# Patient Record
Sex: Female | Born: 1965
Health system: Southern US, Community
[De-identification: ages and names within clinical notes are randomized; demographics above are authoritative.]

## PROBLEM LIST (undated history)

## (undated) DIAGNOSIS — B009 Herpesviral infection, unspecified: Secondary | ICD-10-CM

## (undated) DIAGNOSIS — N870 Mild cervical dysplasia: Secondary | ICD-10-CM

## (undated) DIAGNOSIS — M797 Fibromyalgia: Secondary | ICD-10-CM

## (undated) DIAGNOSIS — M7120 Synovial cyst of popliteal space [Baker], unspecified knee: Secondary | ICD-10-CM

## (undated) DIAGNOSIS — N952 Postmenopausal atrophic vaginitis: Secondary | ICD-10-CM

## (undated) DIAGNOSIS — R0789 Other chest pain: Secondary | ICD-10-CM

## (undated) DIAGNOSIS — I10 Essential (primary) hypertension: Secondary | ICD-10-CM

## (undated) DIAGNOSIS — F419 Anxiety disorder, unspecified: Secondary | ICD-10-CM

## (undated) DIAGNOSIS — M1990 Unspecified osteoarthritis, unspecified site: Secondary | ICD-10-CM

## (undated) DIAGNOSIS — F32A Depression, unspecified: Secondary | ICD-10-CM

## (undated) DIAGNOSIS — M8580 Other specified disorders of bone density and structure, unspecified site: Secondary | ICD-10-CM

## (undated) DIAGNOSIS — B029 Zoster without complications: Secondary | ICD-10-CM

## (undated) DIAGNOSIS — M3214 Glomerular disease in systemic lupus erythematosus: Secondary | ICD-10-CM

## (undated) DIAGNOSIS — G43909 Migraine, unspecified, not intractable, without status migrainosus: Secondary | ICD-10-CM

## (undated) DIAGNOSIS — E28319 Asymptomatic premature menopause: Secondary | ICD-10-CM

## (undated) DIAGNOSIS — R079 Chest pain, unspecified: Secondary | ICD-10-CM

## (undated) DIAGNOSIS — M5030 Other cervical disc degeneration, unspecified cervical region: Secondary | ICD-10-CM

## (undated) DIAGNOSIS — D219 Benign neoplasm of connective and other soft tissue, unspecified: Secondary | ICD-10-CM

## (undated) DIAGNOSIS — G629 Polyneuropathy, unspecified: Secondary | ICD-10-CM

## (undated) HISTORY — DX: Chest pain, unspecified: R07.9

## (undated) HISTORY — PX: MYOMECTOMY: SHX85

## (undated) HISTORY — PX: TUBAL LIGATION: SHX77

## (undated) HISTORY — DX: Polyneuropathy, unspecified: G62.9

## (undated) HISTORY — DX: Postmenopausal atrophic vaginitis: N95.2

## (undated) HISTORY — DX: Benign neoplasm of connective and other soft tissue, unspecified: D21.9

## (undated) HISTORY — DX: Anxiety disorder, unspecified: F41.9

## (undated) HISTORY — DX: Essential (primary) hypertension: I10

## (undated) HISTORY — PX: COLPOSCOPY: SHX161

## (undated) HISTORY — DX: Fibromyalgia: M79.7

## (undated) HISTORY — DX: Glomerular disease in systemic lupus erythematosus: M32.14

## (undated) HISTORY — DX: Unspecified osteoarthritis, unspecified site: M19.90

## (undated) HISTORY — DX: Herpesviral infection, unspecified: B00.9

## (undated) HISTORY — DX: Synovial cyst of popliteal space (Baker), unspecified knee: M71.20

## (undated) HISTORY — DX: Asymptomatic premature menopause: E28.319

## (undated) HISTORY — DX: Other specified disorders of bone density and structure, unspecified site: M85.80

## (undated) HISTORY — DX: Depression, unspecified: F32.A

## (undated) HISTORY — DX: Other cervical disc degeneration, unspecified cervical region: M50.30

## (undated) HISTORY — PX: OTHER SURGICAL HISTORY: SHX169

## (undated) HISTORY — DX: Zoster without complications: B02.9

## (undated) HISTORY — DX: Migraine, unspecified, not intractable, without status migrainosus: G43.909

## (undated) HISTORY — PX: HYSTEROSCOPY: SHX211

## (undated) HISTORY — DX: Mild cervical dysplasia: N87.0

## (undated) HISTORY — DX: Other chest pain: R07.89

---

## 1988-03-27 DIAGNOSIS — N870 Mild cervical dysplasia: Secondary | ICD-10-CM

## 1988-03-27 HISTORY — DX: Mild cervical dysplasia: N87.0

## 1997-08-19 ENCOUNTER — Other Ambulatory Visit: Admission: RE | Admit: 1997-08-19 | Discharge: 1997-08-19 | Payer: Self-pay | Admitting: Obstetrics and Gynecology

## 1998-08-13 ENCOUNTER — Other Ambulatory Visit: Admission: RE | Admit: 1998-08-13 | Discharge: 1998-08-13 | Payer: Self-pay | Admitting: Obstetrics and Gynecology

## 1999-05-29 ENCOUNTER — Emergency Department (HOSPITAL_COMMUNITY): Admission: EM | Admit: 1999-05-29 | Discharge: 1999-05-29 | Payer: Self-pay | Admitting: Emergency Medicine

## 1999-05-29 ENCOUNTER — Encounter: Payer: Self-pay | Admitting: Emergency Medicine

## 1999-09-16 ENCOUNTER — Other Ambulatory Visit: Admission: RE | Admit: 1999-09-16 | Discharge: 1999-09-16 | Payer: Self-pay | Admitting: Obstetrics and Gynecology

## 2000-02-02 ENCOUNTER — Encounter: Admission: RE | Admit: 2000-02-02 | Discharge: 2000-02-02 | Payer: Self-pay | Admitting: Internal Medicine

## 2000-02-02 ENCOUNTER — Encounter: Payer: Self-pay | Admitting: Internal Medicine

## 2000-09-17 ENCOUNTER — Other Ambulatory Visit: Admission: RE | Admit: 2000-09-17 | Discharge: 2000-09-17 | Payer: Self-pay | Admitting: Obstetrics and Gynecology

## 2001-02-05 ENCOUNTER — Encounter: Payer: Self-pay | Admitting: Internal Medicine

## 2001-02-05 ENCOUNTER — Encounter: Admission: RE | Admit: 2001-02-05 | Discharge: 2001-02-05 | Payer: Self-pay | Admitting: Internal Medicine

## 2001-10-29 ENCOUNTER — Encounter (HOSPITAL_BASED_OUTPATIENT_CLINIC_OR_DEPARTMENT_OTHER): Payer: Self-pay | Admitting: General Surgery

## 2001-11-01 ENCOUNTER — Ambulatory Visit (HOSPITAL_COMMUNITY): Admission: RE | Admit: 2001-11-01 | Discharge: 2001-11-01 | Payer: Self-pay | Admitting: General Surgery

## 2001-11-13 ENCOUNTER — Other Ambulatory Visit: Admission: RE | Admit: 2001-11-13 | Discharge: 2001-11-13 | Payer: Self-pay | Admitting: Obstetrics and Gynecology

## 2002-11-26 ENCOUNTER — Other Ambulatory Visit: Admission: RE | Admit: 2002-11-26 | Discharge: 2002-11-26 | Payer: Self-pay | Admitting: Obstetrics and Gynecology

## 2003-01-15 ENCOUNTER — Encounter: Admission: RE | Admit: 2003-01-15 | Discharge: 2003-01-15 | Payer: Self-pay | Admitting: Internal Medicine

## 2003-01-15 ENCOUNTER — Encounter: Payer: Self-pay | Admitting: Internal Medicine

## 2003-04-23 ENCOUNTER — Ambulatory Visit (HOSPITAL_COMMUNITY): Admission: RE | Admit: 2003-04-23 | Discharge: 2003-04-23 | Payer: Self-pay

## 2003-05-28 ENCOUNTER — Ambulatory Visit (HOSPITAL_COMMUNITY): Admission: RE | Admit: 2003-05-28 | Discharge: 2003-05-28 | Payer: Self-pay | Admitting: Nephrology

## 2003-05-29 ENCOUNTER — Encounter: Admission: RE | Admit: 2003-05-29 | Discharge: 2003-05-29 | Payer: Self-pay | Admitting: Nephrology

## 2003-06-05 ENCOUNTER — Observation Stay (HOSPITAL_COMMUNITY): Admission: RE | Admit: 2003-06-05 | Discharge: 2003-06-06 | Payer: Self-pay | Admitting: Nephrology

## 2003-06-05 ENCOUNTER — Encounter (INDEPENDENT_AMBULATORY_CARE_PROVIDER_SITE_OTHER): Payer: Self-pay | Admitting: *Deleted

## 2003-06-11 ENCOUNTER — Ambulatory Visit (HOSPITAL_COMMUNITY): Admission: RE | Admit: 2003-06-11 | Discharge: 2003-06-11 | Payer: Self-pay | Admitting: Nephrology

## 2003-07-13 ENCOUNTER — Ambulatory Visit (HOSPITAL_COMMUNITY): Admission: RE | Admit: 2003-07-13 | Discharge: 2003-07-13 | Payer: Self-pay | Admitting: Nephrology

## 2003-07-20 ENCOUNTER — Encounter (HOSPITAL_COMMUNITY): Admission: RE | Admit: 2003-07-20 | Discharge: 2003-10-18 | Payer: Self-pay | Admitting: Nephrology

## 2003-10-27 ENCOUNTER — Encounter (HOSPITAL_COMMUNITY): Admission: RE | Admit: 2003-10-27 | Discharge: 2004-01-25 | Payer: Self-pay | Admitting: Nephrology

## 2003-11-27 ENCOUNTER — Other Ambulatory Visit: Admission: RE | Admit: 2003-11-27 | Discharge: 2003-11-27 | Payer: Self-pay | Admitting: Obstetrics and Gynecology

## 2004-01-26 ENCOUNTER — Encounter (HOSPITAL_COMMUNITY): Admission: RE | Admit: 2004-01-26 | Discharge: 2004-04-25 | Payer: Self-pay | Admitting: Nephrology

## 2004-02-22 ENCOUNTER — Encounter: Admission: RE | Admit: 2004-02-22 | Discharge: 2004-02-22 | Payer: Self-pay | Admitting: Obstetrics and Gynecology

## 2004-05-02 ENCOUNTER — Encounter (HOSPITAL_COMMUNITY): Admission: RE | Admit: 2004-05-02 | Discharge: 2004-07-31 | Payer: Self-pay | Admitting: Nephrology

## 2004-06-14 ENCOUNTER — Encounter: Admission: RE | Admit: 2004-06-14 | Discharge: 2004-06-14 | Payer: Self-pay | Admitting: Nephrology

## 2004-06-22 ENCOUNTER — Encounter (INDEPENDENT_AMBULATORY_CARE_PROVIDER_SITE_OTHER): Payer: Self-pay | Admitting: *Deleted

## 2004-06-22 ENCOUNTER — Ambulatory Visit (HOSPITAL_BASED_OUTPATIENT_CLINIC_OR_DEPARTMENT_OTHER): Admission: RE | Admit: 2004-06-22 | Discharge: 2004-06-22 | Payer: Self-pay | Admitting: Obstetrics and Gynecology

## 2004-06-22 ENCOUNTER — Ambulatory Visit (HOSPITAL_COMMUNITY): Admission: RE | Admit: 2004-06-22 | Discharge: 2004-06-22 | Payer: Self-pay | Admitting: Obstetrics and Gynecology

## 2004-06-28 ENCOUNTER — Ambulatory Visit: Payer: Self-pay | Admitting: Cardiology

## 2004-06-29 ENCOUNTER — Encounter: Payer: Self-pay | Admitting: Cardiology

## 2004-06-29 ENCOUNTER — Ambulatory Visit (HOSPITAL_COMMUNITY): Admission: RE | Admit: 2004-06-29 | Discharge: 2004-06-29 | Payer: Self-pay | Admitting: Nephrology

## 2004-08-29 ENCOUNTER — Encounter (HOSPITAL_COMMUNITY): Admission: RE | Admit: 2004-08-29 | Discharge: 2004-11-27 | Payer: Self-pay | Admitting: Nephrology

## 2004-11-29 ENCOUNTER — Other Ambulatory Visit: Admission: RE | Admit: 2004-11-29 | Discharge: 2004-11-29 | Payer: Self-pay | Admitting: Obstetrics and Gynecology

## 2004-12-19 ENCOUNTER — Encounter (HOSPITAL_COMMUNITY): Admission: RE | Admit: 2004-12-19 | Discharge: 2005-03-19 | Payer: Self-pay | Admitting: Nephrology

## 2005-04-05 ENCOUNTER — Encounter: Admission: RE | Admit: 2005-04-05 | Discharge: 2005-04-05 | Payer: Self-pay | Admitting: Obstetrics and Gynecology

## 2005-04-10 ENCOUNTER — Encounter (HOSPITAL_COMMUNITY): Admission: RE | Admit: 2005-04-10 | Discharge: 2005-07-09 | Payer: Self-pay | Admitting: Nephrology

## 2005-07-31 ENCOUNTER — Encounter (HOSPITAL_COMMUNITY): Admission: RE | Admit: 2005-07-31 | Discharge: 2005-10-29 | Payer: Self-pay | Admitting: Nephrology

## 2005-12-05 ENCOUNTER — Other Ambulatory Visit: Admission: RE | Admit: 2005-12-05 | Discharge: 2005-12-05 | Payer: Self-pay | Admitting: Obstetrics and Gynecology

## 2005-12-08 ENCOUNTER — Encounter (HOSPITAL_COMMUNITY): Admission: RE | Admit: 2005-12-08 | Discharge: 2006-03-08 | Payer: Self-pay | Admitting: Nephrology

## 2006-03-01 ENCOUNTER — Emergency Department (HOSPITAL_COMMUNITY): Admission: EM | Admit: 2006-03-01 | Discharge: 2006-03-01 | Payer: Self-pay | Admitting: Emergency Medicine

## 2006-03-01 ENCOUNTER — Encounter: Payer: Self-pay | Admitting: Vascular Surgery

## 2006-03-09 ENCOUNTER — Encounter (HOSPITAL_COMMUNITY): Admission: RE | Admit: 2006-03-09 | Discharge: 2006-06-07 | Payer: Self-pay | Admitting: Nephrology

## 2006-03-12 ENCOUNTER — Emergency Department (HOSPITAL_COMMUNITY): Admission: EM | Admit: 2006-03-12 | Discharge: 2006-03-13 | Payer: Self-pay | Admitting: Emergency Medicine

## 2006-04-23 ENCOUNTER — Encounter: Admission: RE | Admit: 2006-04-23 | Discharge: 2006-04-23 | Payer: Self-pay | Admitting: Obstetrics and Gynecology

## 2006-08-31 ENCOUNTER — Encounter: Admission: RE | Admit: 2006-08-31 | Discharge: 2006-08-31 | Payer: Self-pay | Admitting: Nephrology

## 2006-12-11 ENCOUNTER — Other Ambulatory Visit: Admission: RE | Admit: 2006-12-11 | Discharge: 2006-12-11 | Payer: Self-pay | Admitting: Obstetrics and Gynecology

## 2007-03-05 ENCOUNTER — Emergency Department (HOSPITAL_COMMUNITY): Admission: EM | Admit: 2007-03-05 | Discharge: 2007-03-05 | Payer: Self-pay | Admitting: Emergency Medicine

## 2007-06-11 ENCOUNTER — Encounter: Admission: RE | Admit: 2007-06-11 | Discharge: 2007-06-11 | Payer: Self-pay | Admitting: Obstetrics and Gynecology

## 2008-02-04 ENCOUNTER — Encounter: Payer: Self-pay | Admitting: Obstetrics and Gynecology

## 2008-02-04 ENCOUNTER — Ambulatory Visit: Payer: Self-pay | Admitting: Obstetrics and Gynecology

## 2008-02-04 ENCOUNTER — Other Ambulatory Visit: Admission: RE | Admit: 2008-02-04 | Discharge: 2008-02-04 | Payer: Self-pay | Admitting: Obstetrics and Gynecology

## 2008-04-27 ENCOUNTER — Emergency Department (HOSPITAL_COMMUNITY): Admission: EM | Admit: 2008-04-27 | Discharge: 2008-04-27 | Payer: Self-pay | Admitting: Emergency Medicine

## 2008-06-29 ENCOUNTER — Encounter: Admission: RE | Admit: 2008-06-29 | Discharge: 2008-06-29 | Payer: Self-pay | Admitting: Obstetrics and Gynecology

## 2008-07-07 ENCOUNTER — Ambulatory Visit: Payer: Self-pay | Admitting: Obstetrics and Gynecology

## 2008-07-20 ENCOUNTER — Encounter: Admission: RE | Admit: 2008-07-20 | Discharge: 2008-07-20 | Payer: Self-pay | Admitting: Internal Medicine

## 2009-02-09 ENCOUNTER — Other Ambulatory Visit: Admission: RE | Admit: 2009-02-09 | Discharge: 2009-02-09 | Payer: Self-pay | Admitting: Obstetrics and Gynecology

## 2009-02-09 ENCOUNTER — Ambulatory Visit: Payer: Self-pay | Admitting: Obstetrics and Gynecology

## 2009-02-09 ENCOUNTER — Encounter: Payer: Self-pay | Admitting: Obstetrics and Gynecology

## 2009-08-30 ENCOUNTER — Encounter: Admission: RE | Admit: 2009-08-30 | Discharge: 2009-08-30 | Payer: Self-pay | Admitting: Obstetrics and Gynecology

## 2010-04-11 ENCOUNTER — Ambulatory Visit
Admission: RE | Admit: 2010-04-11 | Discharge: 2010-04-11 | Payer: Self-pay | Source: Home / Self Care | Attending: Obstetrics and Gynecology | Admitting: Obstetrics and Gynecology

## 2010-04-11 ENCOUNTER — Other Ambulatory Visit
Admission: RE | Admit: 2010-04-11 | Discharge: 2010-04-11 | Payer: Self-pay | Source: Home / Self Care | Admitting: Obstetrics and Gynecology

## 2010-04-17 ENCOUNTER — Encounter: Payer: Self-pay | Admitting: Nephrology

## 2010-04-17 ENCOUNTER — Encounter: Payer: Self-pay | Admitting: Obstetrics and Gynecology

## 2010-07-19 ENCOUNTER — Encounter (INDEPENDENT_AMBULATORY_CARE_PROVIDER_SITE_OTHER): Payer: 59

## 2010-07-19 DIAGNOSIS — M949 Disorder of cartilage, unspecified: Secondary | ICD-10-CM

## 2010-07-19 DIAGNOSIS — M899 Disorder of bone, unspecified: Secondary | ICD-10-CM

## 2010-08-12 NOTE — Op Note (Signed)
   NAME:  Marie Peterson, Marie Peterson                           ACCOUNT NO.:  [id number]   MEDICAL RECORD NO.:  [id number]                   PATIENT TYPE:  OIB   LOCATION:  2899                                 FACILITY:  [facility]   PHYSICIAN:  Luisa Hart L. Lurene Shadow, M.D.             DATE OF BIRTH:  [birth year]   DATE OF PROCEDURE:  11/01/2001  DATE OF DISCHARGE:  11/01/2001                                 OPERATIVE REPORT   PREOPERATIVE DIAGNOSIS:  Mass, left submandibular area.   POSTOPERATIVE DIAGNOSIS:  Mass, left submandibular area.   PROCEDURE:  Excision of left-sided submandibular cyst.   SURGEON:  Luisa Hart L. Lurene Shadow, M.D.   ASSISTANT:  Nurse.   ANESTHESIA:  General.   INDICATIONS:  This patient is a 45 year old woman presenting with an  enlarging cystic mass in the left submandibular area, which on palpation  feels like a lipoma or a lymph node.  She desires excision and comes to the  operating room after the risks and benefits of surgery have been fully  discussed with her.   DESCRIPTION OF PROCEDURE:  Following induction of satisfactory general  anesthesia, the patient was positioned supinely, head and neck turned toward  the right.  The neck and upper chest were prepped and draped to be included  in the sterile operative field.  I infiltrated this area with 0.5% Marcaine  with epinephrine.  I made a transverse incision along the skin lines over  the mass, deepened this through the skin and subcutaneous tissues, taking  the dissection across the platysma muscle to the mass, which appeared to be  cystic and was sitting on top of a large posterior jugular vein.  The mass  was dissected free from the surrounding tissues, removed in its entirety and  forwarded for pathologic evaluation.  All of the areas of dissection were  then checked for hemostasis, additional bleeding points treated with  electrocautery.  The sponge, instrument, and sharp counts verified and the  platysma muscle and  subcutaneous tissues were reapproximated with 3-0 Vicryl  suture.  The skin was closed with 5-0 Monocryl suture and then reinforced  with Steri-Strips.  Sterile dressing was applied.  Anesthetic reversed and  the patient removed from the operating room to the recovery room in stable  condition.  She tolerated the procedure well.                                                [name], M.D.    PLB/MEDQ  D:  11/01/2001  T:  11/04/2001  Job:  [id number]

## 2010-08-12 NOTE — Discharge Summary (Signed)
NAME:  Marie Peterson, Marie Peterson                           ACCOUNT NO.:  [id number]   MEDICAL RECORD NO.:  [id number]                   PATIENT TYPE:  OBV   LOCATION:  6715                                 FACILITY:  [facility]   PHYSICIAN:  Terrial Rhodes, M.D.             DATE OF BIRTH:  02-01-1966   DATE OF ADMISSION:  06/05/2003  DATE OF DISCHARGE:  06/06/2003                                 DISCHARGE SUMMARY   ADMISSION DIAGNOSES:  1. Acute and subacute glomerulonephritis with nephrotic syndrome.  2. Proteinuria.  3. History of discoid lupus.  4. Hypertension.   DISCHARGE DIAGNOSES:  1. Acute and subacute glomerulonephritis with nephrotic syndrome status post     renal biopsy.  2. Discoid lupus.  3. Proteinuria.  4. Hypertension.   BRIEF HISTORY:  [name] with history of discoid lupus and  nephrotic range proteinuria along with anemia, hypertension, is admitted now  for a renal biopsy.   LABORATORY DATA ON ADMISSION:  Sodium 139, potassium 4.3, chloride 106,  CO2  28, BUN 16, creatinine 0.9, glucose 108.  Platelets 434, hemoglobin 10.5.  Coags  within normal limits.   HOSPITAL COURSE:  The patient underwent a left kidney biopsy guided by renal  ultrasound.  Two passes were made, and two core renal biopsies were  obtained.  There were no acute complications.  The patient tolerated it  well.  She did not experience any unusual pain or hematuria.  The following  morning, her hemoglobin was 9.7.  This revealed a fluctuation; however, it  was 9.4 prior to the 10.5 on admission, then 9.7.  Without hematuria or  flank pain, it did not appear the patient was hemorrhaging.  She was  discharged in stable condition.  She would follow up with Dr. Eliott Nine one to  two weeks when biopsy results are back.  She was also instructed to call the  office with any new onset of stomach pain, back pain, nausea, vomiting,  shortness of breath, or gross hematuria.  She was given hydrocodone to  take  as needed for pain.   DISCHARGE MEDICATIONS:  1. Prednisone 60 mg daily.  2. Lotrel 10/20 mg 1 daily.  3. Hydrochlorothiazide 25 mg daily.  4. Lasix 80 mg b.i.d.  5. Nu-Iron 150 mg daily.      Zenovia Jordan, P.A.                      Terrial Rhodes, M.D.    RRK/MEDQ  D:  06/06/2003  T:  06/08/2003  Job:  [id number]   cc:   [facility]

## 2010-08-12 NOTE — Op Note (Signed)
NAMEDALIS, BEERS                 ACCOUNT NO.:  [id number]   MEDICAL RECORD NO.:  [id number]          PATIENT TYPE:  AMB   LOCATION:  [facility]                         FACILITY:  [facility]   PHYSICIAN:  Daniel L. Gottsegen, M.D.DATE OF BIRTH:  05-10-1965   DATE OF PROCEDURE:  06/22/2004  DATE OF DISCHARGE:                                 OPERATIVE REPORT   PREOPERATIVE DIAGNOSIS:  Dysfunctional uterine bleeding with endometrial  cavity defect.   POSTOPERATIVE DIAGNOSIS:  Dysfunctional uterine bleeding with submucous  myoma.   OPERATION:  Hysteroscopy with resection of submucous myoma.   SURGEON:  Daniel L. Eda Paschal, M.D.   ANESTHESIA:  General.   INDICATIONS:  The patient is a 45 year old gravida 2, para 2, AB, who had  gone through an early menopause, probably secondary to her Cytoxan that she  has taken for her lupus. After going through menopause and not bleeding for  a while, she started to have uncontrolled dysfunctional uterine bleeding.  Ultrasound revealed a 2-3 cm endometrial cavity defect to explain the above.  She now enters the hospital for hysteroscopy and excision of the above.   FINDINGS:  Externals normal. BUS is normal. Vaginal is normal. Cervix is  clean. Uterus is top normal size and shape with 1-1/2 degrees of uterine  descensus. Adnexa are palpably normal. At the time of hysteroscopy, the only  abnormal finding was a large submucous myoma on the anterior wall of the  fundus. It was in the lower part of the fundus. The best estimate of size  was 2-3 cm. It was only about half in the endometrial cavity and about half  in the myometrium.   PROCEDURE:  After adequate general anesthesia, the patient was placed in the  dorsal supine position, prepped and draped usual sterile manner. A single-  tooth tenaculum was placed in the anterior lip of the cervix. The cervix was  dilated to #[facility] dilator. The hysteroscopic resectoscope was introduced,  and 3% sorbitol was  used to expand the intrauterine cavity. A camera was  used for magnification. Settings of the Bovie were 70 coag, 110 cutting,  blend 1. The submucous myoma described above was seen. It was resected with  the wire loop. As it was resected, more and more of it would bulge into the  endometrial cavity from the myometrium and finally when the surgeon was  finished, he felt that he had probably resected the entire myoma, although  it became more difficult to tell because we were fairly deep in the  myometrium at that point. Bleeding was controlled with a coag setting on the  wire loop. All pieces  were removed. Some had to be removed with a sharp curette or a sponge stick.  At the termination of the procedure, there was no bleeding noted. Fluid  deficit was approximately 430 cc. Total operating time was approximately 45  minutes. The patient tolerated the procedure well and left the operating  room in satisfactory condition.      DLG/MEDQ  D:  06/22/2004  T:  06/22/2004  Job:  [id number]

## 2010-09-14 ENCOUNTER — Other Ambulatory Visit: Payer: Self-pay | Admitting: Obstetrics and Gynecology

## 2010-09-14 DIAGNOSIS — Z1231 Encounter for screening mammogram for malignant neoplasm of breast: Secondary | ICD-10-CM

## 2010-09-21 ENCOUNTER — Ambulatory Visit: Payer: Medicare Other

## 2010-09-23 ENCOUNTER — Other Ambulatory Visit: Payer: Self-pay | Admitting: Internal Medicine

## 2010-09-23 DIAGNOSIS — E041 Nontoxic single thyroid nodule: Secondary | ICD-10-CM

## 2010-09-26 ENCOUNTER — Ambulatory Visit
Admission: RE | Admit: 2010-09-26 | Discharge: 2010-09-26 | Disposition: A | Discharge status: Home / Self Care | Payer: 59 | Source: Ambulatory Visit | Attending: Obstetrics and Gynecology | Admitting: Obstetrics and Gynecology

## 2010-09-26 DIAGNOSIS — Z1231 Encounter for screening mammogram for malignant neoplasm of breast: Secondary | ICD-10-CM

## 2010-09-27 ENCOUNTER — Ambulatory Visit
Admission: RE | Admit: 2010-09-27 | Discharge: 2010-09-27 | Disposition: A | Discharge status: Home / Self Care | Payer: 59 | Source: Ambulatory Visit | Attending: Internal Medicine | Admitting: Internal Medicine

## 2010-09-27 DIAGNOSIS — E041 Nontoxic single thyroid nodule: Secondary | ICD-10-CM

## 2010-10-20 ENCOUNTER — Other Ambulatory Visit: Payer: Self-pay | Admitting: Diagnostic Neuroimaging

## 2010-10-20 DIAGNOSIS — M79609 Pain in unspecified limb: Secondary | ICD-10-CM

## 2010-10-20 DIAGNOSIS — R209 Unspecified disturbances of skin sensation: Secondary | ICD-10-CM

## 2010-10-20 DIAGNOSIS — R292 Abnormal reflex: Secondary | ICD-10-CM

## 2010-10-25 ENCOUNTER — Ambulatory Visit
Admission: RE | Admit: 2010-10-25 | Discharge: 2010-10-25 | Disposition: A | Discharge status: Home / Self Care | Payer: 59 | Source: Ambulatory Visit | Attending: Diagnostic Neuroimaging | Admitting: Diagnostic Neuroimaging

## 2010-10-25 DIAGNOSIS — R209 Unspecified disturbances of skin sensation: Secondary | ICD-10-CM

## 2010-10-25 DIAGNOSIS — R292 Abnormal reflex: Secondary | ICD-10-CM

## 2010-10-25 DIAGNOSIS — M79609 Pain in unspecified limb: Secondary | ICD-10-CM

## 2010-10-25 MED ORDER — GADOBENATE DIMEGLUMINE 529 MG/ML IV SOLN
15.0000 mL | Freq: Once | INTRAVENOUS | Status: AC | PRN
  Administered 2010-10-25: 15 mL via INTRAVENOUS

## 2010-12-14 ENCOUNTER — Emergency Department (HOSPITAL_COMMUNITY)
Admission: EM | Admit: 2010-12-14 | Discharge: 2010-12-14 | Disposition: A | Discharge status: Home / Self Care | Payer: 59 | Attending: Emergency Medicine | Admitting: Emergency Medicine

## 2010-12-14 ENCOUNTER — Emergency Department (HOSPITAL_COMMUNITY): Payer: 59

## 2010-12-14 DIAGNOSIS — I10 Essential (primary) hypertension: Secondary | ICD-10-CM | POA: Insufficient documentation

## 2010-12-14 DIAGNOSIS — Z79899 Other long term (current) drug therapy: Secondary | ICD-10-CM | POA: Insufficient documentation

## 2010-12-14 DIAGNOSIS — R079 Chest pain, unspecified: Secondary | ICD-10-CM | POA: Insufficient documentation

## 2010-12-14 LAB — COMPREHENSIVE METABOLIC PANEL
ALT: 14 U/L (ref 0–35)
AST: 19 U/L (ref 0–37)
Albumin: 3.9 g/dL (ref 3.5–5.2)
Alkaline Phosphatase: 59 U/L (ref 39–117)
BUN: 10 mg/dL (ref 6–23)
CO2: 30 mEq/L (ref 19–32)
Calcium: 9.2 mg/dL (ref 8.4–10.5)
Chloride: 104 mEq/L (ref 96–112)
Creatinine, Ser: 0.8 mg/dL (ref 0.50–1.10)
GFR calc Af Amer: >60 mL/min (ref >60)
GFR calc non Af Amer: >60 mL/min (ref >60)
Glucose, Bld: 83 mg/dL (ref 70–99)
Potassium: 3.5 mEq/L (ref 3.5–5.1)
Sodium: 141 mEq/L (ref 135–145)
Total Bilirubin: 0.4 mg/dL (ref 0.3–1.2)
Total Protein: 7 g/dL (ref 6.0–8.3)

## 2010-12-14 LAB — POCT I-STAT TROPONIN I
Troponin i, poc: 0 ng/mL (ref 0.00–0.08)
Troponin i, poc: 0 ng/mL (ref 0.00–0.08)

## 2010-12-14 LAB — DIFFERENTIAL
Basophils Absolute: 0 10*3/uL (ref 0.0–0.1)
Basophils Relative: 1 % (ref 0–1)
Eosinophils Absolute: 0 10*3/uL (ref 0.0–0.7)
Eosinophils Relative: 1 % (ref 0–5)
Lymphocytes Relative: 54 % — ABNORMAL HIGH (ref 12–46)
Lymphs Abs: 2.1 10*3/uL (ref 0.7–4.0)
Monocytes Absolute: 0.4 10*3/uL (ref 0.1–1.0)
Monocytes Relative: 11 % (ref 3–12)
Neutro Abs: 1.4 10*3/uL — ABNORMAL LOW (ref 1.7–7.7)
Neutrophils Relative %: 34 % — ABNORMAL LOW (ref 43–77)

## 2010-12-14 LAB — CBC
HCT: 35.2 % — ABNORMAL LOW (ref 36.0–46.0)
Hemoglobin: 12.6 g/dL (ref 12.0–15.0)
MCH: 31.3 pg (ref 26.0–34.0)
MCHC: 35.8 g/dL (ref 30.0–36.0)
MCV: 87.3 fL (ref 78.0–100.0)
Platelets: 183 10*3/uL (ref 150–400)
RBC: 4.03 MIL/uL (ref 3.87–5.11)
RDW: 11.6 % (ref 11.5–15.5)
WBC: 4 10*3/uL (ref 4.0–10.5)

## 2011-04-07 ENCOUNTER — Other Ambulatory Visit: Payer: Self-pay | Admitting: Obstetrics and Gynecology

## 2011-04-17 ENCOUNTER — Ambulatory Visit: Payer: 59 | Admitting: Physical Therapy

## 2011-05-01 DIAGNOSIS — M25569 Pain in unspecified knee: Secondary | ICD-10-CM | POA: Diagnosis not present

## 2011-05-01 DIAGNOSIS — M329 Systemic lupus erythematosus, unspecified: Secondary | ICD-10-CM | POA: Diagnosis not present

## 2011-05-11 ENCOUNTER — Other Ambulatory Visit: Payer: Self-pay | Admitting: Obstetrics and Gynecology

## 2011-05-18 ENCOUNTER — Encounter: Payer: Self-pay | Admitting: Gynecology

## 2011-05-18 DIAGNOSIS — M8580 Other specified disorders of bone density and structure, unspecified site: Secondary | ICD-10-CM | POA: Insufficient documentation

## 2011-05-18 DIAGNOSIS — N952 Postmenopausal atrophic vaginitis: Secondary | ICD-10-CM | POA: Insufficient documentation

## 2011-05-18 DIAGNOSIS — D219 Benign neoplasm of connective and other soft tissue, unspecified: Secondary | ICD-10-CM | POA: Insufficient documentation

## 2011-05-18 DIAGNOSIS — M3214 Glomerular disease in systemic lupus erythematosus: Secondary | ICD-10-CM | POA: Insufficient documentation

## 2011-05-18 DIAGNOSIS — G43909 Migraine, unspecified, not intractable, without status migrainosus: Secondary | ICD-10-CM | POA: Insufficient documentation

## 2011-05-24 DIAGNOSIS — R609 Edema, unspecified: Secondary | ICD-10-CM | POA: Diagnosis not present

## 2011-05-24 DIAGNOSIS — M329 Systemic lupus erythematosus, unspecified: Secondary | ICD-10-CM | POA: Diagnosis not present

## 2011-05-25 ENCOUNTER — Encounter: Payer: 59 | Admitting: Obstetrics and Gynecology

## 2011-05-26 DIAGNOSIS — M329 Systemic lupus erythematosus, unspecified: Secondary | ICD-10-CM | POA: Diagnosis not present

## 2011-07-04 ENCOUNTER — Encounter: Payer: Self-pay | Admitting: Obstetrics and Gynecology

## 2011-07-04 ENCOUNTER — Ambulatory Visit (INDEPENDENT_AMBULATORY_CARE_PROVIDER_SITE_OTHER): Payer: 59 | Admitting: Obstetrics and Gynecology

## 2011-07-04 VITALS — BP 120/74 | Ht 64.0 in | Wt 162.0 lb

## 2011-07-04 DIAGNOSIS — N951 Menopausal and female climacteric states: Secondary | ICD-10-CM | POA: Diagnosis not present

## 2011-07-04 DIAGNOSIS — R351 Nocturia: Secondary | ICD-10-CM

## 2011-07-04 DIAGNOSIS — M8580 Other specified disorders of bone density and structure, unspecified site: Secondary | ICD-10-CM

## 2011-07-04 DIAGNOSIS — R3915 Urgency of urination: Secondary | ICD-10-CM

## 2011-07-04 DIAGNOSIS — N952 Postmenopausal atrophic vaginitis: Secondary | ICD-10-CM

## 2011-07-04 DIAGNOSIS — Z780 Asymptomatic menopausal state: Secondary | ICD-10-CM

## 2011-07-04 DIAGNOSIS — N950 Postmenopausal bleeding: Secondary | ICD-10-CM

## 2011-07-04 DIAGNOSIS — M949 Disorder of cartilage, unspecified: Secondary | ICD-10-CM

## 2011-07-04 DIAGNOSIS — M899 Disorder of bone, unspecified: Secondary | ICD-10-CM

## 2011-07-04 DIAGNOSIS — M1990 Unspecified osteoarthritis, unspecified site: Secondary | ICD-10-CM | POA: Insufficient documentation

## 2011-07-04 NOTE — Progress Notes (Signed)
Patient came back to see me today for further followup. She went through premature menopause and for a while she took systemic estrogen. She has now stopped it. She is having some night sweats but they are tolerable. She does have atrophic vaginitis and when they last had intercourse 3-4 months ago it was painful and she had a little bleeding afterwards. We have given her Estrace cream but she only used it when she had intercourse. We have had her on biphosphonate's for 5-6 years and her last bone density was now normal. She has had no fractures. Other than the episode related to intercourse she is having no vaginal bleeding. She is having no pelvic pain. She is getting 4-5 times a night to urinate and has urgency. She does not have glaucoma. She does not have dysuria or incontinence. Her PCP started her on ditropan but is not helping. She had a urinalysis at her nephrologist one month ago.  ROS: 12 system review done. Pertinent positives above. Other positives include lupus nephritis, migraines, and osteoarthritis.  Physical examination: Kennon Portela present HEENT within normal limits. Neck: Thyroid not large. No masses. Supraclavicular nodes: not enlarged. Breasts: Examined in both sitting and lying  position. No skin changes and no masses. Abdomen: Soft no guarding rebound or masses or hernia. Pelvic: External: Within normal limits. BUS: Within normal limits. Vaginal:within normal limits. Poor  estrogen effect. No evidence of cystocele rectocele or enterocele. Cervix: clean. Uterus: Normal size and shape. Adnexa: No masses. Rectovaginal exam: Confirmatory and negative. Extremities: Within normal limits.  Assessment: #1. Half flashes #2. Atrophic vaginitis with some bleeding with  last intercourse. #3. Low bone mass now better #4. Detrussor instability.  Plan: Continue yearly mammograms. Stopped patient's Fosamax. Follow up bone density next year. Enablex 7.5mg m daily. Told the patient we can  increase the dose. Stop ditropan.  Estrace vaginal cream use 2-3 times a week but not with intercourse.

## 2011-07-11 DIAGNOSIS — R079 Chest pain, unspecified: Secondary | ICD-10-CM | POA: Diagnosis not present

## 2011-07-11 DIAGNOSIS — E782 Mixed hyperlipidemia: Secondary | ICD-10-CM | POA: Diagnosis not present

## 2011-07-12 ENCOUNTER — Telehealth: Payer: Self-pay | Admitting: *Deleted

## 2011-07-12 MED ORDER — DARIFENACIN HYDROBROMIDE ER 15 MG PO TB24: 15.0000 mg | ORAL_TABLET | Freq: Every day | ORAL | Status: DC

## 2011-07-12 NOTE — Telephone Encounter (Signed)
Pt was seen on 07/04/11 and given enablex 7.5 mg tablets, pt is requesting a higher does on enablex. Please advise

## 2011-07-12 NOTE — Telephone Encounter (Signed)
Increase enablex to 15mg . Daily. They make a 15mg m tablet but she can take two of the 7.5 until she runs out.

## 2011-07-12 NOTE — Telephone Encounter (Signed)
Pt informed with the below note, rx sent, pt will be out to 7.5 tablets on Sunday.

## 2011-07-13 ENCOUNTER — Telehealth: Payer: Self-pay | Admitting: *Deleted

## 2011-07-13 MED ORDER — OXYBUTYNIN CHLORIDE 5 MG PO TABS: 5.0000 mg | ORAL_TABLET | Freq: Three times a day (TID) | ORAL | Status: DC

## 2011-07-13 MED ORDER — OXYBUTYNIN CHLORIDE 10 % TD GEL: 1.1400 mL | Freq: Every day | TRANSDERMAL | Status: DC

## 2011-07-13 NOTE — Telephone Encounter (Signed)
Pt said that Dr.Kim tried her on this medication and it did not work at all. I can give pt samples but I didn't want to take all of the them.

## 2011-07-13 NOTE — Telephone Encounter (Signed)
rx sent to pharmacy, pt informed with the below note

## 2011-07-13 NOTE — Telephone Encounter (Signed)
Addended by: Aura Camps on: 07/13/2011 11:40 AM   Modules accepted: Orders

## 2011-07-13 NOTE — Telephone Encounter (Signed)
Have her try gelnique. It is a patch. One patch daily upper arm/shoulder/thigh  or her abdomen. Rotated daily

## 2011-07-13 NOTE — Telephone Encounter (Signed)
Pt called again regarding the gelnique and said that was $50.00 as well. Pt said that the pharmacy told her she could  try oxybutynin (DITROPAN) 5 MG tablet and increase to 3 times daily? I told pt per last OV she said the medication didn't work, pt was only taking 1 po daily 5 mg. She is willing to try 5 mg 3 times a day if okay with you. pharmacy said this will be cheaper for her. Please advise

## 2011-07-13 NOTE — Telephone Encounter (Signed)
Pt informed, rx sent 

## 2011-07-13 NOTE — Telephone Encounter (Signed)
Pt went to pharmacy to pick up enablex 15 mg( rx sent yesterday)  and said it was too expensive $ 50.00. We do have samples her for this, pt would like to know if this well be a long term medication? If so pt would like another rx. Please advise

## 2011-07-13 NOTE — Telephone Encounter (Signed)
Let us do that

## 2011-07-13 NOTE — Telephone Encounter (Signed)
Have patient find out if Vesicare would be more cost-effective.

## 2011-07-20 DIAGNOSIS — R079 Chest pain, unspecified: Secondary | ICD-10-CM | POA: Diagnosis not present

## 2011-08-09 DIAGNOSIS — L930 Discoid lupus erythematosus: Secondary | ICD-10-CM | POA: Diagnosis not present

## 2011-09-18 DIAGNOSIS — R21 Rash and other nonspecific skin eruption: Secondary | ICD-10-CM | POA: Diagnosis not present

## 2011-09-18 DIAGNOSIS — E669 Obesity, unspecified: Secondary | ICD-10-CM | POA: Diagnosis not present

## 2011-09-27 ENCOUNTER — Other Ambulatory Visit: Payer: Self-pay | Admitting: Obstetrics and Gynecology

## 2011-09-27 DIAGNOSIS — Z1231 Encounter for screening mammogram for malignant neoplasm of breast: Secondary | ICD-10-CM

## 2011-10-03 ENCOUNTER — Ambulatory Visit
Admission: RE | Admit: 2011-10-03 | Discharge: 2011-10-03 | Disposition: A | Discharge status: Home / Self Care | Payer: 59 | Source: Ambulatory Visit | Attending: Obstetrics and Gynecology | Admitting: Obstetrics and Gynecology

## 2011-10-03 DIAGNOSIS — Z1231 Encounter for screening mammogram for malignant neoplasm of breast: Secondary | ICD-10-CM

## 2011-10-24 ENCOUNTER — Other Ambulatory Visit: Payer: Self-pay | Admitting: Obstetrics and Gynecology

## 2011-10-24 NOTE — Telephone Encounter (Signed)
Patient called back and told me that Enablex had been too expensive and she made a phone call to the office about it.  It was in a series of phone calls that RX was changed to Ditropan tid.  I do indeed see this documentation now and will refill her Ditropan per those notes. Patient just had CE in 06/2011.

## 2011-10-24 NOTE — Telephone Encounter (Signed)
Chart states Ditropan did not help her and she was changed to Enablex.  That was last note about this. Left message for patient to call to clarify.

## 2011-10-30 DIAGNOSIS — M329 Systemic lupus erythematosus, unspecified: Secondary | ICD-10-CM | POA: Diagnosis not present

## 2011-11-13 ENCOUNTER — Telehealth: Payer: Self-pay | Admitting: *Deleted

## 2011-11-13 NOTE — Telephone Encounter (Signed)
Pt called requesting last bone density date 07/19/10 given to pt.

## 2011-11-14 DIAGNOSIS — M329 Systemic lupus erythematosus, unspecified: Secondary | ICD-10-CM | POA: Diagnosis not present

## 2011-12-25 DIAGNOSIS — B0229 Other postherpetic nervous system involvement: Secondary | ICD-10-CM | POA: Diagnosis not present

## 2011-12-25 DIAGNOSIS — M329 Systemic lupus erythematosus, unspecified: Secondary | ICD-10-CM | POA: Diagnosis not present

## 2012-01-24 ENCOUNTER — Telehealth: Payer: Self-pay | Admitting: *Deleted

## 2012-01-24 NOTE — Telephone Encounter (Signed)
Pt called requesting estrace cream samples. 3 pack left up front for pick up.

## 2012-01-30 DIAGNOSIS — R233 Spontaneous ecchymoses: Secondary | ICD-10-CM | POA: Diagnosis not present

## 2012-01-30 DIAGNOSIS — Z23 Encounter for immunization: Secondary | ICD-10-CM | POA: Diagnosis not present

## 2012-01-30 DIAGNOSIS — M329 Systemic lupus erythematosus, unspecified: Secondary | ICD-10-CM | POA: Diagnosis not present

## 2012-02-21 ENCOUNTER — Other Ambulatory Visit (HOSPITAL_COMMUNITY): Payer: Self-pay | Admitting: Cardiovascular Disease

## 2012-02-21 DIAGNOSIS — I872 Venous insufficiency (chronic) (peripheral): Secondary | ICD-10-CM

## 2012-02-27 DIAGNOSIS — G43909 Migraine, unspecified, not intractable, without status migrainosus: Secondary | ICD-10-CM | POA: Diagnosis not present

## 2012-02-27 DIAGNOSIS — E669 Obesity, unspecified: Secondary | ICD-10-CM | POA: Diagnosis not present

## 2012-02-27 DIAGNOSIS — M329 Systemic lupus erythematosus, unspecified: Secondary | ICD-10-CM | POA: Diagnosis not present

## 2012-02-27 DIAGNOSIS — D6959 Other secondary thrombocytopenia: Secondary | ICD-10-CM | POA: Diagnosis not present

## 2012-03-15 ENCOUNTER — Encounter (HOSPITAL_COMMUNITY): Payer: 59

## 2012-03-26 DIAGNOSIS — M329 Systemic lupus erythematosus, unspecified: Secondary | ICD-10-CM | POA: Diagnosis not present

## 2012-03-26 DIAGNOSIS — L659 Nonscarring hair loss, unspecified: Secondary | ICD-10-CM | POA: Diagnosis not present

## 2012-03-28 ENCOUNTER — Encounter (HOSPITAL_COMMUNITY): Payer: 59

## 2012-03-28 ENCOUNTER — Ambulatory Visit (HOSPITAL_COMMUNITY)
Admission: RE | Admit: 2012-03-28 | Discharge: 2012-03-28 | Disposition: A | Discharge status: Home / Self Care | Payer: 59 | Source: Ambulatory Visit | Attending: Cardiovascular Disease | Admitting: Cardiovascular Disease

## 2012-03-28 DIAGNOSIS — M7120 Synovial cyst of popliteal space [Baker], unspecified knee: Secondary | ICD-10-CM

## 2012-03-28 DIAGNOSIS — M7989 Other specified soft tissue disorders: Secondary | ICD-10-CM | POA: Diagnosis not present

## 2012-03-28 DIAGNOSIS — I872 Venous insufficiency (chronic) (peripheral): Secondary | ICD-10-CM | POA: Diagnosis not present

## 2012-03-28 HISTORY — DX: Synovial cyst of popliteal space (Baker), unspecified knee: M71.20

## 2012-03-28 NOTE — Progress Notes (Signed)
Venous duplex completed for Venous Insufficiency. Marie Peterson .

## 2012-04-08 DIAGNOSIS — L930 Discoid lupus erythematosus: Secondary | ICD-10-CM | POA: Diagnosis not present

## 2012-04-08 DIAGNOSIS — Z03818 Encounter for observation for suspected exposure to other biological agents ruled out: Secondary | ICD-10-CM | POA: Diagnosis not present

## 2012-04-15 DIAGNOSIS — M329 Systemic lupus erythematosus, unspecified: Secondary | ICD-10-CM | POA: Diagnosis not present

## 2012-04-19 DIAGNOSIS — R209 Unspecified disturbances of skin sensation: Secondary | ICD-10-CM | POA: Diagnosis not present

## 2012-04-19 DIAGNOSIS — M79609 Pain in unspecified limb: Secondary | ICD-10-CM | POA: Diagnosis not present

## 2012-04-22 DIAGNOSIS — M329 Systemic lupus erythematosus, unspecified: Secondary | ICD-10-CM | POA: Diagnosis not present

## 2012-04-22 DIAGNOSIS — I10 Essential (primary) hypertension: Secondary | ICD-10-CM | POA: Diagnosis not present

## 2012-05-16 ENCOUNTER — Ambulatory Visit (INDEPENDENT_AMBULATORY_CARE_PROVIDER_SITE_OTHER): Payer: 59 | Admitting: Gynecology

## 2012-05-16 ENCOUNTER — Encounter: Payer: Self-pay | Admitting: Gynecology

## 2012-05-16 DIAGNOSIS — R21 Rash and other nonspecific skin eruption: Secondary | ICD-10-CM

## 2012-05-16 NOTE — Patient Instructions (Signed)
Apply hydrocortisone to the right chest wall rash. Apply over-the-counter antifungal skin cream to the rash under the left breast. Follow up with dermatology if the rash persists.

## 2012-05-16 NOTE — Progress Notes (Signed)
Patient presents complaining of rash under both breasts noticed for several days. Started a formulated cream on her right chest wall for post herpetic pain after a bout of shingles.  Exam was kim assistant Both breast examined lying and sitting without masses retractions discharge adenopathy. Inferior lateral to the right breast along the chest wall is in red erythematous rash. No skin breaks. Under the left breast is a more hyperpigmented linear rash along her bra line.  Assessment and plan: 1. Right chest wall rash I think is allergic reaction to the cream. Recommended stopping it present in using hydrocortisone cream 3 times a day. If it persists I recommended dermatology follow up. 2. Left inframammary rash appears fungal. Recommend OTC antifungal skin cream nightly with hydrocortisone cream in the morning. If this rash persists she will see a dermatologist.

## 2012-05-28 DIAGNOSIS — G43909 Migraine, unspecified, not intractable, without status migrainosus: Secondary | ICD-10-CM | POA: Diagnosis not present

## 2012-05-28 DIAGNOSIS — B0229 Other postherpetic nervous system involvement: Secondary | ICD-10-CM | POA: Diagnosis not present

## 2012-05-28 DIAGNOSIS — M129 Arthropathy, unspecified: Secondary | ICD-10-CM | POA: Diagnosis not present

## 2012-06-03 ENCOUNTER — Other Ambulatory Visit: Payer: Self-pay | Admitting: Obstetrics and Gynecology

## 2012-06-03 NOTE — Telephone Encounter (Signed)
Has April CE scheduled with Dr. Velvet Bathe.

## 2012-07-04 ENCOUNTER — Ambulatory Visit (INDEPENDENT_AMBULATORY_CARE_PROVIDER_SITE_OTHER): Payer: 59 | Admitting: Gynecology

## 2012-07-04 ENCOUNTER — Encounter: Payer: Self-pay | Admitting: Gynecology

## 2012-07-04 VITALS — BP 120/70 | Ht 63.5 in | Wt 162.0 lb

## 2012-07-04 DIAGNOSIS — D259 Leiomyoma of uterus, unspecified: Secondary | ICD-10-CM

## 2012-07-04 DIAGNOSIS — N951 Menopausal and female climacteric states: Secondary | ICD-10-CM

## 2012-07-04 DIAGNOSIS — Z01419 Encounter for gynecological examination (general) (routine) without abnormal findings: Secondary | ICD-10-CM

## 2012-07-04 NOTE — Progress Notes (Signed)
Marie Peterson February 22, 1966 [id number]        47 y.o.  G2P2002 for annual exam.  Without complaints. Former patient of Dr. Verl Dicker. Several issues noted below.  Past medical history,surgical history, medications, allergies, family history and social history were all reviewed and documented in the EPIC chart. ROS:  Was performed and pertinent positives and negatives are included in the history.  Exam: Kim assistant Filed Vitals:   07/04/12 1014  BP: 120/70  Height: 5' 3.5" (1.613 m)  Weight: 162 lb (73.483 kg)   General appearance  Normal Skin grossly normal Head/Neck normal with no cervical or supraclavicular adenopathy thyroid normal Lungs  clear Cardiac RR, without RMG Abdominal  soft, nontender, without masses, organomegaly or hernia Breasts  examined lying and sitting without masses, retractions, discharge or axillary adenopathy. Pelvic  Ext/BUS/vagina  normal with mild atrophic changes  Cervix  normal   Uterus  anteverted, normal size, shape and contour, midline and mobile nontender   Adnexa  Without masses or tenderness    Anus and perineum  normal   Rectovaginal  normal sphincter tone without palpated masses or tenderness.    Assessment/Plan:  47 y.o. G49P2002 female for annual exam.   1. Premature menopause.  Age 58 following chemotherapy for her lupus. Transiently was on HRT but was discontinued.  She's done well without significant symptoms and we'll continue to monitor. She knows to report any bleeding. 2. Leiomyoma.  History of hysteroscopic myomectomy. Ultrasound 2006 shows a 42 mm myoma. Patient's exam is normal today. We'll continue to monitor. 3. Lupus.  Closely monitored and doing well. Follow up with her other physicians. 4. Mammography 09/2011.  Continue with annual mammography this summer. SBE monthly reviewed. 5. Pap smear 2012.  No Pap smear done today. No history of abnormal Pap smears. Plan repeat Pap smear next year 3 year interval. 6. DEXA.  History of  osteopenia previously with T score -1.1 in 2010. Apparently had a significant loss of bone proceeding this and she was started on Actonel. Took for several years per her history but was discontinued. Her last DEXA 2012 is normal. Plan observation with repeat DEXA in several years. 7. Health maintenance.  No lab work done as this is all done through her primary physician's office and her lupus physicians. Follow up one year, sooner as needed.    Dara Lords MD, 10:36 AM 07/04/2012

## 2012-07-04 NOTE — Patient Instructions (Signed)
Follow up in one year for annual exam 

## 2012-07-09 ENCOUNTER — Encounter: Payer: Self-pay | Admitting: Obstetrics and Gynecology

## 2012-07-16 DIAGNOSIS — A088 Other specified intestinal infections: Secondary | ICD-10-CM | POA: Diagnosis not present

## 2012-07-26 DIAGNOSIS — I10 Essential (primary) hypertension: Secondary | ICD-10-CM | POA: Diagnosis not present

## 2012-07-26 DIAGNOSIS — R6889 Other general symptoms and signs: Secondary | ICD-10-CM | POA: Diagnosis not present

## 2012-09-02 DIAGNOSIS — M329 Systemic lupus erythematosus, unspecified: Secondary | ICD-10-CM | POA: Diagnosis not present

## 2012-09-02 DIAGNOSIS — G589 Mononeuropathy, unspecified: Secondary | ICD-10-CM | POA: Diagnosis not present

## 2012-09-02 DIAGNOSIS — Z0000 Encounter for general adult medical examination without abnormal findings: Secondary | ICD-10-CM | POA: Diagnosis not present

## 2012-09-18 DIAGNOSIS — M329 Systemic lupus erythematosus, unspecified: Secondary | ICD-10-CM | POA: Diagnosis not present

## 2012-09-18 DIAGNOSIS — M25569 Pain in unspecified knee: Secondary | ICD-10-CM | POA: Diagnosis not present

## 2012-10-22 ENCOUNTER — Encounter: Payer: Self-pay | Admitting: Diagnostic Neuroimaging

## 2012-10-22 ENCOUNTER — Ambulatory Visit (INDEPENDENT_AMBULATORY_CARE_PROVIDER_SITE_OTHER): Payer: 59 | Admitting: Diagnostic Neuroimaging

## 2012-10-22 VITALS — BP 101/65 | HR 53 | Ht 64.0 in | Wt 157.5 lb

## 2012-10-22 DIAGNOSIS — G629 Polyneuropathy, unspecified: Secondary | ICD-10-CM | POA: Insufficient documentation

## 2012-10-22 DIAGNOSIS — G609 Hereditary and idiopathic neuropathy, unspecified: Secondary | ICD-10-CM | POA: Diagnosis not present

## 2012-10-22 DIAGNOSIS — Z8619 Personal history of other infectious and parasitic diseases: Secondary | ICD-10-CM | POA: Insufficient documentation

## 2012-10-22 DIAGNOSIS — R292 Abnormal reflex: Secondary | ICD-10-CM | POA: Insufficient documentation

## 2012-10-22 DIAGNOSIS — IMO0002 Reserved for concepts with insufficient information to code with codable children: Secondary | ICD-10-CM

## 2012-10-22 MED ORDER — GABAPENTIN 800 MG PO TABS: 800.0000 mg | ORAL_TABLET | Freq: Three times a day (TID) | ORAL | Status: DC

## 2012-10-22 NOTE — Patient Instructions (Signed)
Increase gabapentin to 800mg  (3 times per day).  Try neuropathy cream on feet and right torso regions.  I will check additional neuropathy testing.

## 2012-10-22 NOTE — Progress Notes (Signed)
[facility]  PATIENT: Marie Peterson DOB: 08/16/1965  REFERRING CLINICIAN:  HISTORY FROM: patient REASON FOR VISIT: follow up   HISTORICAL  CHIEF COMPLAINT:  Chief Complaint  Patient presents with  . Follow-up    burning sensation in feet, bilateral  . Pain    knees  . Bleeding/Bruising    both legs    HISTORY OF PRESENT ILLNESS:   UPDATE 10/22/12: Since last visit, using compounded neuropathy cream on right torso (BID -TID) and it helps, better than lidoderm. Using gabapentin 600mg  TID. Feet still burning.  UPDATE 04/19/12: Since last visit, no new events. EMG, MRI, labs all neg. Tolerating gabapentin and lidoderm patch for post-herpetic neuralgia on right torso. Sxs in hands and feet unchanged.  PRIOR HPI: 47 year old right-handed female with history of systemic lupus with glomerulonephritis, osteoarthritis, shingles, here for evaluation of diffuse numbness and pain in arms and legs since March 2012.  Patient reports diffuse numbness, pain, burning sensation, and her bilateral arms and bilateral legs. Her left leg is more affected than the right. Sitting in the car or walking tend to aggravate the symptoms in her legs. She has no symptoms above her neck. She denies significant neck pain.  She has been on gabapentin since 2010 when she was diagnosed with shingles affecting her right torso.  Her gabapentin, hydrocodone, etodoloc have not been increased since her numbness and pain have developed in March 2012.  She had an outside NCS (BUE only; without EMG) which was normal.  REVIEW OF SYSTEMS: Full 14 system review of systems performed and notable only for fatigue swelling in legs easy bruising joint pain joint swelling cramps aching muscles constipation urination problems headache numbness weakness restless legs.  ALLERGIES: No Known Allergies  HOME MEDICATIONS: Outpatient Prescriptions Prior to Visit  Medication Sig Dispense Refill  . amLODipine  (NORVASC) 2.5 MG tablet Take 2.5 mg by mouth daily.      [name] Kitchen aspirin 81 MG tablet Take 81 mg by mouth daily.      [name] Kitchen BENAZEPRIL-HYDROCHLOROTHIAZIDE PO Take by mouth.      . Cholecalciferol (VITAMIN D PO) Take by mouth.      . diclofenac sodium (VOLTAREN) 1 % GEL Apply topically.      . furosemide (LASIX) 80 MG tablet Take 40 mg by mouth 2 (two) times daily.       . hydroxychloroquine (PLAQUENIL) 200 MG tablet Take by mouth daily.      [name] Kitchen lidocaine (LIDODERM) 5 % Place 1 patch onto the skin daily. Remove & Discard patch within 12 hours or as directed by MD      . MAGNESIUM PO Take by mouth.      . Multiple Vitamin (MULTIVITAMIN) tablet Take 1 tablet by mouth daily.      [name] Kitchen oxybutynin (DITROPAN) 5 MG tablet take 1 tablet by mouth three times a day  90 tablet  0  . pantoprazole (PROTONIX) 40 MG tablet Take 40 mg by mouth daily.      . Polysaccharide Iron Complex (NU-IRON PO) Take by mouth.      . Potassium Chloride (KLOR-CON PO) Take by mouth.      . predniSONE (DELTASONE) 5 MG tablet Take 5 mg by mouth daily.      . traZODone (DESYREL) 100 MG tablet Take 100 mg by mouth at bedtime.      . gabapentin (NEURONTIN) 300 MG capsule Take 300 mg by mouth 3 (three) times daily.      [name] Kitchen HYDROcodone-acetaminophen (  LORCET) 10-650 MG per tablet Take 1 tablet by mouth every 6 (six) hours as needed.       No facility-administered medications prior to visit.    PAST MEDICAL HISTORY: Past Medical History  Diagnosis Date  . CIN I (cervical intraepithelial neoplasia I) 1990  . Dysmenorrhea   . Atrophic vaginitis   . Migraines   . Lupus nephritis   . Fibroid   . Arthritis   . Premature menopause age 51    following chemotherapy for Lupus  . Hypertension     PAST SURGICAL HISTORY: Past Surgical History  Procedure Laterality Date  . Cesarean section    . Myomectomy      Hysteroscopic myomectomy  . Hysteroscopy      Myomectomy  . Tubal ligation    . Colposcopy      FAMILY HISTORY: Family History    Problem Relation Age of Onset  . Hypertension Mother   . Asthma Mother   . Diabetes Father   . Hypertension Father   . Heart disease Father     SOCIAL HISTORY:  History   Social History  . Marital Status: Married    Spouse Name: N/A    Number of Children: 2  . Years of Education: N/A   Occupational History  .     Social History Main Topics  . Smoking status: Never Smoker   . Smokeless tobacco: Never Used  . Alcohol Use: No  . Drug Use: No  . Sexually Active: Yes    Birth Control/ Protection: Surgical     Comment: Tubal lig   Other Topics Concern  . Not on file   Social History Narrative   Patient lives at home with her family.   Caffeine Use: 2 cups of coffee daily.      PHYSICAL EXAM  Filed Vitals:   10/22/12 1439  BP: 101/65  Pulse: 53  Height: 5\' 4"  (1.626 m)  Weight: 157 lb 8 oz (71.442 kg)    Not recorded    Body mass index is 27.02 kg/(m^2).  GENERAL EXAM: General: Patient is awake, alert and in no acute distress.  Well developed and groomed. Cardiovascular: No carotid artery bruits.  Heart is regular rate and rhythm with no murmurs.  Neurologic Exam  Mental Status: Awake, alert.  Language is fluent and comprehension intact.  Cranial Nerves: Pupils are equal and reactive to light (4--> 3).  Visual fields are full to confrontation.  Conjugate eye movements are full and symmetric.  Facial sensation and strength are symmetric.  Hearing is intact.  Palate elevated symmetrically and uvula is midline.  Shoulder shrug is symmetric.  Tongue is midline. Motor: Normal bulk and tone.  Full strength in the upper and lower extremities.  No pronator drift. Sensory: Intact and symmetric to light touch, pinprick, temperature, vibration and proprioception; EXCEPT SLIGHTLY DECREASED PP IN THE BILATERAL FEET IN A GRADIENT UP TO KNEES. Coordination: No ataxia or dysmetria on finger-nose or rapid alternating movement testing. Gait and Station: Narrow based gait, able  to walk on heels and toes.  Tandem gait is stable.  Romberg is negative. Reflexes: BUE 3. NEGATIVE HOFFMAN'S. KNEES 2, ANKLES 1.    DIAGNOSTIC DATA (LABS, IMAGING, TESTING) - I reviewed patient records, labs, notes, testing and imaging myself where available.  Lab Results  Component Value Date   WBC 4.0 12/14/2010   HGB 12.6 12/14/2010   HCT 35.2* 12/14/2010   MCV 87.3 12/14/2010   PLT 183 12/14/2010  Component Value Date/Time   NA 141 12/14/2010 1851   K 3.5 12/14/2010 1851   CL 104 12/14/2010 1851   CO2 30 12/14/2010 1851   GLUCOSE 83 12/14/2010 1851   BUN 10 12/14/2010 1851   CREATININE 0.80 12/14/2010 1851   CALCIUM 9.2 12/14/2010 1851   PROT 7.0 12/14/2010 1851   ALBUMIN 3.9 12/14/2010 1851   AST 19 12/14/2010 1851   ALT 14 12/14/2010 1851   ALKPHOS 59 12/14/2010 1851   BILITOT 0.4 12/14/2010 1851   GFRNONAA >60 12/14/2010 1851   GFRAA >60 12/14/2010 1851   No results found for this basename: CHOL, HDL, LDLCALC, LDLDIRECT, TRIG, CHOLHDL   No results found for this basename: HGBA1C   No results found for this basename: VITAMINB12   No results found for this basename: TSH   09/12/10  TSH 0.867 B12 721 A1c 5.6  10/03/10 EMG/NCS - normal  09/23/10 MRI cervical spine (wo) - Disc bulging and uncovertebral joint hypertrophy from C3-4 down to C6-7.  Mild biforaminal stenosis at C6-7. No intrinsic or compressive spinal cord lesions.  10/25/10 MRI brain (w/wo) - normal   ASSESSMENT AND PLAN  47 y.o. right-handed female with history of systemic lupus, right thoracic shingles and osteoarthritis, now with progressive numbness, burning, pain since March 2012.  Exam notable for decreased sensation in the bilateral lower extremities, hyperreflexia in the bilateral upper extremities.  Dx: post-herpetic neuralgia (right thoracic) + unexplained diffuse paresthesias in hands/feet (?small fiber neuropathy) + unexplained hyperreflexia in BUE  PLAN: 1. Increase gabapentin to 800mg  TID 2.  Continue neuropathy cream (can also use on feet) 3. Labs to evaluate for other causes of neuropathy  Orders Placed This Encounter  Procedures  . Ceruloplasmin  . Copper, Serum  . IFE, PE and FLC, Serum  . IFE AND PE, RANDOM URINE  . Lyme Ab/Western Blot Reflex  . HIV 1/2 confirmation, western blot  . Sedimentation rate  . C-reactive protein  . Vitamin B1  . Vitamin B6  . Angiotensin converting enzyme  . Hep C Antibody  . Hep B Surface Antibody  . Hep B Surface Antigen  . Hepatitis B Core AB, Total     Suanne Marker, MD 10/22/2012, 3:46 PM Certified in Neurology, Neurophysiology and Neuroimaging  [facility] [address], [address] [place], [place] [address] [phone]

## 2012-10-22 NOTE — Addendum Note (Signed)
Addended byJoycelyn Schmid on: 10/22/2012 04:31 PM   Modules accepted: Orders

## 2012-10-22 NOTE — Addendum Note (Signed)
Addended byJoycelyn Schmid on: 10/22/2012 04:33 PM   Modules accepted: Orders

## 2012-10-22 NOTE — Addendum Note (Signed)
Addended byJoycelyn Schmid on: 10/22/2012 04:12 PM   Modules accepted: Orders

## 2012-10-26 LAB — ANGIOTENSIN CONVERTING ENZYME: Angio Convert Enzyme: <14 U/L — ABNORMAL LOW (ref 14–82)

## 2012-10-26 LAB — SEDIMENTATION RATE: Sed Rate: 5 mm/hr (ref 0–32)

## 2012-10-26 LAB — IFE AND PE, RANDOM URINE
% BETA, Urine: 26.7 %
ALBUMIN, U: 33 %
ALPHA 1 URINE: 3.3 %
ALPHA-2-GLOBULIN, U: 11.6 %
GAMMA GLOBULIN URINE: 25.3 %
Protein, Ur: 12.4 mg/dL (ref 0.0–15.0)

## 2012-10-26 LAB — IFE, PE AND FLC, SERUM
Albumin SerPl Elph-Mcnc: 4.1 g/dL (ref 3.2–5.6)
Albumin/Glob SerPl: 1.6 (ref 0.7–2.0)
Alpha 1: 0.2 g/dL (ref 0.1–0.4)
Alpha2 Glob SerPl Elph-Mcnc: 0.6 g/dL (ref 0.4–1.2)
B-Globulin SerPl Elph-Mcnc: 1.2 g/dL (ref 0.6–1.3)
Gamma Glob SerPl Elph-Mcnc: 0.7 g/dL (ref 0.5–1.6)
Globulin, Total: 2.7 g/dL (ref 2.0–4.5)
Ig Kappa Free Light Chain: 13.79 mg/L (ref 3.30–19.40)
Ig Lambda Free Light Chain: 10.91 mg/L (ref 5.71–26.30)
IgA/Immunoglobulin A, Serum: 367 mg/dL (ref 91–414)
IgG (Immunoglobin G), Serum: 967 mg/dL (ref 700–1600)
IgM (Immunoglobulin M), Srm: 41 mg/dL (ref 40–230)
Kappa/Lambda FluidC Ratio: 1.26 (ref 0.26–1.65)
Total Protein: 6.8 g/dL (ref 6.0–8.5)

## 2012-10-26 LAB — HEPATITIS B CORE ANTIBODY, TOTAL: Hep B Core Total Ab: NEGATIVE

## 2012-10-26 LAB — HEPATITIS C ANTIBODY: Hep C Virus Ab: <0.1 s/co ratio (ref 0.0–0.9)

## 2012-10-26 LAB — C-REACTIVE PROTEIN: CRP: 1 mg/L (ref 0.0–4.9)

## 2012-10-26 LAB — HEPATITIS B SURFACE ANTIBODY,QUALITATIVE: Hep B Surface Ab, Qual: NONREACTIVE

## 2012-10-26 LAB — HEPATITIS B SURFACE ANTIGEN: Hepatitis B Surface Ag: NEGATIVE

## 2012-10-26 LAB — LYME AB/WESTERN BLOT REFLEX
Lyme Ab: <0.91 index (ref 0.00–0.90)
Lyme Disease Ab, Quant, IgM: <0.91 index (ref 0.00–0.90)

## 2012-10-26 LAB — HIV ANTIBODY (ROUTINE TESTING W REFLEX)
HIV 1/O/2 Abs-Index Value: <1 (ref <1.00)
HIV-1/HIV-2 Ab: NONREACTIVE

## 2012-10-26 LAB — CERULOPLASMIN: Ceruloplasmin: 22.5 mg/dL (ref 16.0–45.0)

## 2012-10-26 LAB — COPPER, SERUM: Copper: 110 ug/dL (ref 72–166)

## 2012-10-26 LAB — VITAMIN B6: Vitamin B6: 14.4 ug/L (ref 2.0–32.8)

## 2012-10-26 LAB — VITAMIN B1, WHOLE BLOOD

## 2012-11-13 ENCOUNTER — Other Ambulatory Visit: Payer: Self-pay

## 2012-11-13 DIAGNOSIS — Z1231 Encounter for screening mammogram for malignant neoplasm of breast: Secondary | ICD-10-CM

## 2012-11-14 DIAGNOSIS — L930 Discoid lupus erythematosus: Secondary | ICD-10-CM | POA: Diagnosis not present

## 2012-11-14 DIAGNOSIS — Z03818 Encounter for observation for suspected exposure to other biological agents ruled out: Secondary | ICD-10-CM | POA: Diagnosis not present

## 2012-12-03 ENCOUNTER — Ambulatory Visit
Admission: RE | Admit: 2012-12-03 | Discharge: 2012-12-03 | Disposition: A | Discharge status: Home / Self Care | Payer: 59 | Source: Ambulatory Visit

## 2012-12-03 DIAGNOSIS — Z1231 Encounter for screening mammogram for malignant neoplasm of breast: Secondary | ICD-10-CM

## 2013-01-30 ENCOUNTER — Other Ambulatory Visit: Payer: Self-pay

## 2013-02-26 ENCOUNTER — Other Ambulatory Visit: Payer: Self-pay

## 2013-02-26 MED ORDER — PANTOPRAZOLE SODIUM 40 MG PO TBEC: 40.0000 mg | DELAYED_RELEASE_TABLET | Freq: Every day | ORAL | Status: DC

## 2013-02-26 NOTE — Telephone Encounter (Signed)
Rx was sent to pharmacy electronically. 

## 2013-02-28 ENCOUNTER — Other Ambulatory Visit: Payer: Self-pay | Admitting: *Deleted

## 2013-02-28 MED ORDER — MAGNESIUM 400 MG PO CAPS: 1.0000 | ORAL_CAPSULE | Freq: Every day | ORAL | Status: DC

## 2013-02-28 MED ORDER — POTASSIUM CHLORIDE 20 MEQ PO PACK: 20.0000 meq | PACK | Freq: Once | ORAL | Status: DC

## 2013-03-07 ENCOUNTER — Encounter: Payer: Self-pay | Admitting: Cardiovascular Disease

## 2013-03-07 ENCOUNTER — Ambulatory Visit (INDEPENDENT_AMBULATORY_CARE_PROVIDER_SITE_OTHER): Payer: 59 | Admitting: Cardiovascular Disease

## 2013-03-07 VITALS — BP 142/74 | HR 50 | Ht 64.0 in | Wt 156.0 lb

## 2013-03-07 DIAGNOSIS — I10 Essential (primary) hypertension: Secondary | ICD-10-CM

## 2013-03-07 NOTE — Progress Notes (Signed)
03/07/2013 Marie Peterson   12-01-1965  [id number]  Primary Physician Pearson Grippe, MD Primary Cardiologist: Runell Gess MD Marie Peterson   HPI:  Marie Peterson is a 47 year old mildly overweight married an American female mother of 2 daughters one of them goes to the [employer] [facility]. And the other goes to [employer], who is formally a patient of Dr. Kandis Cocking. She currently is out on disability because of SLE. Her factors include treated hypertension. Her father had bypass surgery at age 29. She's never had a heart attack or stroke. She denies chest pain or shortness of breath. She does have GERD.   Current Outpatient Prescriptions  Medication Sig Dispense Refill  . amLODipine (NORVASC) 5 MG tablet Take 5 mg by mouth daily.      [name] Kitchen aspirin 81 MG tablet Take 81 mg by mouth daily.      [name] Kitchen BENAZEPRIL-HYDROCHLOROTHIAZIDE PO Take by mouth daily.       . chloroquine (ARALEN) 250 MG tablet Take 250 mg by mouth daily.      . Cholecalciferol (VITAMIN D PO) Take by mouth.      . diclofenac sodium (VOLTAREN) 1 % GEL Apply topically.      . furosemide (LASIX) 80 MG tablet Take 40 mg by mouth 2 (two) times daily.       [name] Kitchen gabapentin (NEURONTIN) 800 MG tablet Take 1 tablet (800 mg total) by mouth 3 (three) times daily.  90 tablet  12  . HYDROcodone-acetaminophen (NORCO) 10-325 MG per tablet Take 1 tablet by mouth every 6 (six) hours as needed for pain.      [name] Kitchen L-Methylfolate-B6-B12 (METANX PO) Take 1 tablet by mouth daily.      [name] Kitchen lidocaine (LIDODERM) 5 % Place 1 patch onto the skin daily. Remove & Discard patch within 12 hours or as directed by MD      . Magnesium 400 MG CAPS Take 1 capsule by mouth daily.  30 capsule  5  . Multiple Vitamin (MULTIVITAMIN) tablet Take 1 tablet by mouth daily.      [name] Kitchen oxybutynin (DITROPAN) 5 MG tablet take 1 tablet by mouth three times a day  90 tablet  0  . pantoprazole (PROTONIX) 40 MG tablet Take 1 tablet (40 mg total) by mouth daily.  30 tablet  5  . Polysaccharide Iron  Complex (FERREX 150 PO) Take 150 mg by mouth daily.      . potassium chloride (KLOR-CON) 20 MEQ packet Take 20 mEq by mouth once.  1 tablet  5  . predniSONE (DELTASONE) 1 MG tablet Take 1 mg by mouth daily.      . predniSONE (DELTASONE) 5 MG tablet Take 5 mg by mouth daily.      [name] Kitchen topiramate (TOPAMAX) 25 MG tablet Take 25 mg by mouth daily.      . Transdermal Base CREA Apply topically.      . traZODone (DESYREL) 100 MG tablet Take 100 mg by mouth at bedtime.      . [DISCONTINUED] oxybutynin (DITROPAN) 5 MG tablet take 1 tablet by mouth three times a day  90 tablet  6   No current facility-administered medications for this visit.    No Known Allergies  History   Social History  . Marital Status: Married    Spouse Name: N/A    Number of Children: 2  . Years of Education: N/A   Occupational History  .     Social History Main Topics  . Smoking  status: Never Smoker   . Smokeless tobacco: Never Used  . Alcohol Use: No  . Drug Use: No  . Sexual Activity: Yes    Birth Control/ Protection: Surgical     Comment: Tubal lig   Other Topics Concern  . Not on file   Social History Narrative   Patient lives at home with her family.   Caffeine Use: 2 cups of coffee daily.      Review of Systems: General: negative for chills, fever, night sweats or weight changes.  Cardiovascular: negative for chest pain, dyspnea on exertion, edema, orthopnea, palpitations, paroxysmal nocturnal dyspnea or shortness of breath Dermatological: negative for rash Respiratory: negative for cough or wheezing Urologic: negative for hematuria Abdominal: negative for nausea, vomiting, diarrhea, bright red blood per rectum, melena, or hematemesis Neurologic: negative for visual changes, syncope, or dizziness All other systems reviewed and are otherwise negative except as noted above.    Blood pressure 142/74, pulse 50, height 5\' 4"  (1.626 m), weight 156 lb (70.761 kg).  General appearance: alert and no  distress Neck: no adenopathy, no carotid bruit, no JVD, supple, symmetrical, trachea midline and thyroid not enlarged, symmetric, no tenderness/mass/nodules Lungs: clear to auscultation bilaterally Heart: regular rate and rhythm, S1, S2 normal, no murmur, click, rub or gallop Abdomen: soft, non-tender; bowel sounds normal; no masses,  no organomegaly Extremities: extremities normal, atraumatic, no cyanosis or edema Pulses: 2+ and symmetric  EKG sinus bradycardia at 50 with ST or T wave changes  ASSESSMENT AND PLAN:   Essential hypertension Under good control on current medications      Runell Gess MD [facility], [facility], [place] 03/07/2013 12:22 PM

## 2013-03-07 NOTE — Patient Instructions (Signed)
Your physician wants you to follow-up in: 1 year with an extender and 2 years with Dr Allyson Sabal. You will receive a reminder letter in the mail two months in advance. If you don't receive a letter, please call our office to schedule the follow-up appointment.

## 2013-03-07 NOTE — Assessment & Plan Note (Signed)
Under good control on current medications 

## 2013-03-10 ENCOUNTER — Telehealth: Payer: Self-pay | Admitting: Cardiovascular Disease

## 2013-03-10 MED ORDER — POTASSIUM CHLORIDE CRYS ER 20 MEQ PO TBCR: 20.0000 meq | EXTENDED_RELEASE_TABLET | Freq: Every day | ORAL | Status: DC

## 2013-03-10 NOTE — Telephone Encounter (Signed)
Returned call and pt verified x 2.  Pt informed RN saw oversight w/ refill and will send in Rx now.  RN also asked pt if she takes pills and not powder.  Pt stated pills.  Refill(s) sent to pharmacy.  Pt verbalized understanding and agreed w/ plan.

## 2013-03-10 NOTE — Telephone Encounter (Signed)
Saw Dr Allyson Sabal on Friday. Was told they would call in RX for potassium on Friday  1 pill called in on 12/5  Called pharmay and they do not have.  Please call

## 2013-03-13 ENCOUNTER — Other Ambulatory Visit: Payer: Self-pay | Admitting: Internal Medicine

## 2013-03-13 DIAGNOSIS — M25551 Pain in right hip: Secondary | ICD-10-CM

## 2013-03-13 DIAGNOSIS — M25559 Pain in unspecified hip: Secondary | ICD-10-CM | POA: Diagnosis not present

## 2013-03-13 DIAGNOSIS — R229 Localized swelling, mass and lump, unspecified: Secondary | ICD-10-CM | POA: Diagnosis not present

## 2013-03-13 DIAGNOSIS — R030 Elevated blood-pressure reading, without diagnosis of hypertension: Secondary | ICD-10-CM | POA: Diagnosis not present

## 2013-03-18 DIAGNOSIS — M329 Systemic lupus erythematosus, unspecified: Secondary | ICD-10-CM | POA: Diagnosis not present

## 2013-03-18 DIAGNOSIS — M25559 Pain in unspecified hip: Secondary | ICD-10-CM | POA: Diagnosis not present

## 2013-03-22 ENCOUNTER — Ambulatory Visit
Admission: RE | Admit: 2013-03-22 | Discharge: 2013-03-22 | Disposition: A | Discharge status: Home / Self Care | Payer: 59 | Source: Ambulatory Visit | Attending: Internal Medicine | Admitting: Internal Medicine

## 2013-03-22 DIAGNOSIS — M25559 Pain in unspecified hip: Secondary | ICD-10-CM | POA: Diagnosis not present

## 2013-03-22 DIAGNOSIS — M25551 Pain in right hip: Secondary | ICD-10-CM

## 2013-03-31 DIAGNOSIS — I889 Nonspecific lymphadenitis, unspecified: Secondary | ICD-10-CM | POA: Diagnosis not present

## 2013-04-18 DIAGNOSIS — M25559 Pain in unspecified hip: Secondary | ICD-10-CM | POA: Diagnosis not present

## 2013-04-21 DIAGNOSIS — I889 Nonspecific lymphadenitis, unspecified: Secondary | ICD-10-CM | POA: Diagnosis not present

## 2013-04-22 ENCOUNTER — Ambulatory Visit: Payer: 59 | Admitting: Diagnostic Neuroimaging

## 2013-04-23 ENCOUNTER — Ambulatory Visit
Admission: RE | Admit: 2013-04-23 | Discharge: 2013-04-23 | Disposition: A | Discharge status: Home / Self Care | Payer: 59 | Source: Ambulatory Visit | Attending: Dermatology | Admitting: Dermatology

## 2013-04-23 ENCOUNTER — Other Ambulatory Visit: Payer: Self-pay | Admitting: Dermatology

## 2013-04-23 DIAGNOSIS — R590 Localized enlarged lymph nodes: Secondary | ICD-10-CM

## 2013-04-23 DIAGNOSIS — R059 Cough, unspecified: Secondary | ICD-10-CM | POA: Diagnosis not present

## 2013-04-23 DIAGNOSIS — R509 Fever, unspecified: Secondary | ICD-10-CM

## 2013-04-23 DIAGNOSIS — R05 Cough: Secondary | ICD-10-CM | POA: Diagnosis not present

## 2013-04-29 DIAGNOSIS — I10 Essential (primary) hypertension: Secondary | ICD-10-CM | POA: Diagnosis not present

## 2013-04-29 DIAGNOSIS — Z87448 Personal history of other diseases of urinary system: Secondary | ICD-10-CM | POA: Diagnosis not present

## 2013-04-29 DIAGNOSIS — M329 Systemic lupus erythematosus, unspecified: Secondary | ICD-10-CM | POA: Diagnosis not present

## 2013-05-01 ENCOUNTER — Encounter: Payer: Self-pay | Admitting: Diagnostic Neuroimaging

## 2013-05-01 ENCOUNTER — Ambulatory Visit (INDEPENDENT_AMBULATORY_CARE_PROVIDER_SITE_OTHER): Payer: 59 | Admitting: Diagnostic Neuroimaging

## 2013-05-01 VITALS — BP 122/77 | HR 61 | Ht 64.0 in | Wt 154.0 lb

## 2013-05-01 DIAGNOSIS — G609 Hereditary and idiopathic neuropathy, unspecified: Secondary | ICD-10-CM

## 2013-05-01 DIAGNOSIS — G629 Polyneuropathy, unspecified: Secondary | ICD-10-CM

## 2013-05-01 NOTE — Patient Instructions (Signed)
Continue gabapentin and topical neuropathy cream.

## 2013-05-01 NOTE — Progress Notes (Signed)
[facility]  PATIENT: Marie Peterson DOB: 07-Jul-1965  REFERRING CLINICIAN:  HISTORY FROM: patient REASON FOR VISIT: follow up   HISTORICAL  CHIEF COMPLAINT:  Chief Complaint  Patient presents with  . Follow-up    burning in feet    HISTORY OF PRESENT ILLNESS:   UPDATE 05/01/13: Since last visit, symptoms are stable. Pain is slightly better than last visit. Using topical neuropathy cream BID. Still on gabapentin $RemoveBefor'800mg'BnjVuQkfVLds$  TID. Overall satisfied with symptom control.  UPDATE 10/22/12: Since last visit, using compounded neuropathy cream on right torso (BID -TID) and it helps, better than lidoderm. Using gabapentin $RemoveBeforeDE'600mg'yDspRpXlZJAezNQ$  TID. Feet still burning.  UPDATE 04/19/12: Since last visit, no new events. EMG, MRI, labs all neg. Tolerating gabapentin and lidoderm patch for post-herpetic neuralgia on right torso. Sxs in hands and feet unchanged.  PRIOR HPI: 48 year old right-handed female with history of systemic lupus with glomerulonephritis, osteoarthritis, shingles, here for evaluation of diffuse numbness and pain in arms and legs since March 2012.  Patient reports diffuse numbness, pain, burning sensation, and her bilateral arms and bilateral legs. Her left leg is more affected than the right. Sitting in the car or walking tend to aggravate the symptoms in her legs. She has no symptoms above her neck. She denies significant neck pain.  She has been on gabapentin since 2010 when she was diagnosed with shingles affecting her right torso.  Her gabapentin, hydrocodone, etodoloc have not been increased since her numbness and pain have developed in March 2012.  She had an outside NCS (BUE only; without EMG) which was normal.  REVIEW OF SYSTEMS: Full 14 system review of systems performed and notable only forfreq urination, joint pain, aching muscles, muscle cramps.  ALLERGIES: No Known Allergies  HOME MEDICATIONS: Outpatient Prescriptions Prior to Visit  Medication Sig Dispense  Refill  . amLODipine (NORVASC) 5 MG tablet Take 5 mg by mouth daily.      [name] Kitchen aspirin 81 MG tablet Take 81 mg by mouth daily.      [name] Kitchen BENAZEPRIL-HYDROCHLOROTHIAZIDE PO Take 40 mg by mouth daily.       . chloroquine (ARALEN) 250 MG tablet Take 250 mg by mouth daily.      . Cholecalciferol (VITAMIN D PO) Take by mouth.      . diclofenac sodium (VOLTAREN) 1 % GEL Apply topically.      . furosemide (LASIX) 80 MG tablet Take 40 mg by mouth 2 (two) times daily.       [name] Kitchen gabapentin (NEURONTIN) 800 MG tablet Take 1 tablet (800 mg total) by mouth 3 (three) times daily.  90 tablet  12  . HYDROcodone-acetaminophen (NORCO) 10-325 MG per tablet Take 1 tablet by mouth every 6 (six) hours as needed for pain.      [name] Kitchen L-Methylfolate-B6-B12 (METANX PO) Take 1 tablet by mouth daily.      [name] Kitchen lidocaine (LIDODERM) 5 % Place 1 patch onto the skin daily. Remove & Discard patch within 12 hours or as directed by MD      . Magnesium 400 MG CAPS Take 1 capsule by mouth daily.  30 capsule  5  . Multiple Vitamin (MULTIVITAMIN) tablet Take 1 tablet by mouth daily.      [name] Kitchen oxybutynin (DITROPAN) 5 MG tablet take 1 tablet by mouth three times a day  90 tablet  0  . pantoprazole (PROTONIX) 40 MG tablet Take 1 tablet (40 mg total) by mouth daily.  30 tablet  5  . Polysaccharide  Iron Complex (FERREX 150 PO) Take 150 mg by mouth daily.      . potassium chloride (KLOR-CON) 20 MEQ packet Take 20 mEq by mouth once.  1 tablet  5  . potassium chloride SA (K-DUR,KLOR-CON) 20 MEQ tablet Take 1 tablet (20 mEq total) by mouth daily.  30 tablet  11  . predniSONE (DELTASONE) 1 MG tablet Take 1 mg by mouth daily.      . predniSONE (DELTASONE) 5 MG tablet Take 5 mg by mouth daily.      [name] Kitchen topiramate (TOPAMAX) 25 MG tablet Take 25 mg by mouth daily.      . Transdermal Base CREA Apply topically.      . traZODone (DESYREL) 100 MG tablet Take 100 mg by mouth at bedtime.       No facility-administered medications prior to visit.    PAST MEDICAL  HISTORY: Past Medical History  Diagnosis Date  . CIN I (cervical intraepithelial neoplasia I) 1990  . Dysmenorrhea   . Atrophic vaginitis   . Migraines   . Lupus nephritis   . Fibroid   . Arthritis   . Premature menopause age 89    following chemotherapy for Lupus  . Hypertension     PAST SURGICAL HISTORY: Past Surgical History  Procedure Laterality Date  . Cesarean section    . Myomectomy      Hysteroscopic myomectomy  . Hysteroscopy      Myomectomy  . Tubal ligation    . Colposcopy      FAMILY HISTORY: Family History  Problem Relation Age of Onset  . Hypertension Mother   . Asthma Mother   . Diabetes Father   . Hypertension Father   . Heart disease Father     SOCIAL HISTORY:  History   Social History  . Marital Status: Married    Spouse Name: [name]    Number of Children: 2  . Years of Education: College   Occupational History  .      disability   Social History Main Topics  . Smoking status: Never Smoker   . Smokeless tobacco: Never Used  . Alcohol Use: No  . Drug Use: No  . Sexual Activity: Yes    Birth Control/ Protection: Surgical     Comment: Tubal lig   Other Topics Concern  . Not on file   Social History Narrative   Patient lives at home with her family.   Caffeine Use: 2 cups of coffee daily.      PHYSICAL EXAM  Filed Vitals:   05/01/13 1414  BP: 122/77  Pulse: 61  Height: $Remove'5\' 4"'IsuDYmi$  (1.626 m)  Weight: 154 lb (69.854 kg)    Not recorded    Body mass index is 26.42 kg/(m^2).  GENERAL EXAM: General: Patient is awake, alert and in no acute distress.  Well developed and groomed. Cardiovascular: No carotid artery bruits.  Heart is regular rate and rhythm with no murmurs.  Neurologic Exam  Mental Status: Awake, alert.  Language is fluent and comprehension intact.  Cranial Nerves: Pupils are equal and reactive to light (4--> 3).  Visual fields are full to confrontation.  Conjugate eye movements are full and symmetric.  Facial  sensation and strength are symmetric.  Hearing is intact.  Palate elevated symmetrically and uvula is midline.  Shoulder shrug is symmetric.  Tongue is midline. Motor: Normal bulk and tone.  Full strength in the upper and lower extremities.  No pronator drift. Sensory: Intact and symmetric to light  touch, temperature, vibration; SLIGHTLY DECREASED PP IN THE BILATERAL FEET IN A GRADIENT UP TO KNEES. Coordination: No ataxia or dysmetria on finger-nose or rapid alternating movement testing. Gait and Station: Narrow based gait, able to walk on heels and toes.  Tandem gait is stable.  Romberg is negative. Reflexes: BUE 2. KNEES 2, ANKLES 1.    DIAGNOSTIC DATA (LABS, IMAGING, TESTING) - I reviewed patient records, labs, notes, testing and imaging myself where available.  Lab Results  Component Value Date   WBC 4.0 12/14/2010   HGB 12.6 12/14/2010   HCT 35.2* 12/14/2010   MCV 87.3 12/14/2010   PLT 183 12/14/2010      Component Value Date/Time   NA 141 12/14/2010 1851   K 3.5 12/14/2010 1851   CL 104 12/14/2010 1851   CO2 30 12/14/2010 1851   GLUCOSE 83 12/14/2010 1851   BUN 10 12/14/2010 1851   CREATININE 0.80 12/14/2010 1851   CALCIUM 9.2 12/14/2010 1851   PROT 6.8 10/22/2012 1634   PROT 7.0 12/14/2010 1851   ALBUMIN 3.9 12/14/2010 1851   AST 19 12/14/2010 1851   ALT 14 12/14/2010 1851   ALKPHOS 59 12/14/2010 1851   BILITOT 0.4 12/14/2010 1851   GFRNONAA >60 12/14/2010 1851   GFRAA >60 12/14/2010 1851   No results found for this basename: CHOL,  HDL,  LDLCALC,  LDLDIRECT,  TRIG,  CHOLHDL   No results found for this basename: HGBA1C   No results found for this basename: VITAMINB12   No results found for this basename: TSH   09/12/10  TSH 0.867 B12 721 A1c 5.6  10/03/10 EMG/NCS - normal  09/23/10 MRI cervical spine (wo) - Disc bulging and uncovertebral joint hypertrophy from C3-4 down to C6-7.  Mild biforaminal stenosis at C6-7. No intrinsic or compressive spinal cord lesions.  10/25/10 MRI  brain (w/wo) - normal  10/22/12 Neuropathy labs: copper, ceruloplasmin, SPEP, lyme, CRP, ESR, ACE, B1, B6, Hep B, Hep C, HIV - all normal   ASSESSMENT AND PLAN  48 y.o. right-handed female with history of systemic lupus, right thoracic shingles and osteoarthritis, now with progressive numbness, burning, pain since March 2012.  Exam notable for decreased sensation in the bilateral lower extremities, hyperreflexia in the bilateral upper extremities.  Dx: post-herpetic neuralgia (right thoracic) + idiopathic small fiber neuropathy  PLAN: 1. continue gabapentin to $RemoveBefor'800mg'IBVAaltoROiz$  TID 2. continue neuropathy cream   Return in about 1 year (around 05/01/2014) for with Jarold Motto, MD 03/27/1550, 0:80 PM Certified in Neurology, Neurophysiology and Neuroimaging  [facility] [address], [place], [place] [address] [phone]

## 2013-05-15 DIAGNOSIS — Z03818 Encounter for observation for suspected exposure to other biological agents ruled out: Secondary | ICD-10-CM | POA: Diagnosis not present

## 2013-05-15 DIAGNOSIS — L930 Discoid lupus erythematosus: Secondary | ICD-10-CM | POA: Diagnosis not present

## 2013-05-16 DIAGNOSIS — M25559 Pain in unspecified hip: Secondary | ICD-10-CM | POA: Diagnosis not present

## 2013-06-09 DIAGNOSIS — R030 Elevated blood-pressure reading, without diagnosis of hypertension: Secondary | ICD-10-CM | POA: Diagnosis not present

## 2013-06-16 DIAGNOSIS — D72820 Lymphocytosis (symptomatic): Secondary | ICD-10-CM | POA: Diagnosis not present

## 2013-06-16 DIAGNOSIS — R030 Elevated blood-pressure reading, without diagnosis of hypertension: Secondary | ICD-10-CM | POA: Diagnosis not present

## 2013-06-16 DIAGNOSIS — M329 Systemic lupus erythematosus, unspecified: Secondary | ICD-10-CM | POA: Diagnosis not present

## 2013-06-16 DIAGNOSIS — G4700 Insomnia, unspecified: Secondary | ICD-10-CM | POA: Diagnosis not present

## 2013-06-17 ENCOUNTER — Telehealth: Payer: Self-pay | Admitting: Oncology

## 2013-06-17 NOTE — Telephone Encounter (Signed)
S/W PATIENT AND GAVE NEW PATIENT APPT FOR 04/07 @ 10:30 W/DR. SHADAD.  [facility] PACKET MAILED.

## 2013-06-30 ENCOUNTER — Other Ambulatory Visit: Payer: Self-pay | Admitting: Oncology

## 2013-06-30 DIAGNOSIS — D72820 Lymphocytosis (symptomatic): Secondary | ICD-10-CM

## 2013-07-01 ENCOUNTER — Ambulatory Visit: Payer: 59

## 2013-07-01 ENCOUNTER — Other Ambulatory Visit (HOSPITAL_BASED_OUTPATIENT_CLINIC_OR_DEPARTMENT_OTHER): Payer: 59

## 2013-07-01 ENCOUNTER — Encounter: Payer: Self-pay | Admitting: Oncology

## 2013-07-01 ENCOUNTER — Other Ambulatory Visit (HOSPITAL_COMMUNITY)
Admission: RE | Admit: 2013-07-01 | Discharge: 2013-07-01 | Disposition: A | Discharge status: Home / Self Care | Payer: 59 | Source: Ambulatory Visit | Attending: Oncology | Admitting: Oncology

## 2013-07-01 ENCOUNTER — Ambulatory Visit (HOSPITAL_BASED_OUTPATIENT_CLINIC_OR_DEPARTMENT_OTHER): Payer: 59 | Admitting: Oncology

## 2013-07-01 VITALS — BP 112/70 | HR 52 | Temp 97.8°F | Resp 20 | Ht 64.0 in | Wt 151.9 lb

## 2013-07-01 DIAGNOSIS — D72819 Decreased white blood cell count, unspecified: Secondary | ICD-10-CM

## 2013-07-01 DIAGNOSIS — D72820 Lymphocytosis (symptomatic): Secondary | ICD-10-CM

## 2013-07-01 DIAGNOSIS — D7289 Other specified disorders of white blood cells: Secondary | ICD-10-CM | POA: Diagnosis not present

## 2013-07-01 LAB — COMPREHENSIVE METABOLIC PANEL (CC13)
ALT: 36 U/L (ref 0–55)
AST: 47 U/L — ABNORMAL HIGH (ref 5–34)
Albumin: 3.9 g/dL (ref 3.5–5.0)
Alkaline Phosphatase: 80 U/L (ref 40–150)
Anion Gap: 8 mEq/L (ref 3–11)
BUN: 12.7 mg/dL (ref 7.0–26.0)
CO2: 26 mEq/L (ref 22–29)
Calcium: 9.5 mg/dL (ref 8.4–10.4)
Chloride: 108 mEq/L (ref 98–109)
Creatinine: 0.9 mg/dL (ref 0.6–1.1)
Glucose: 94 mg/dl (ref 70–140)
Potassium: 4.2 mEq/L (ref 3.5–5.1)
Sodium: 142 mEq/L (ref 136–145)
Total Bilirubin: 0.48 mg/dL (ref 0.20–1.20)
Total Protein: 6.7 g/dL (ref 6.4–8.3)

## 2013-07-01 LAB — CBC WITH DIFFERENTIAL/PLATELET
BASO%: 0.8 % (ref 0.0–2.0)
Basophils Absolute: 0 10*3/uL (ref 0.0–0.1)
EOS%: 0.6 % (ref 0.0–7.0)
Eosinophils Absolute: 0 10*3/uL (ref 0.0–0.5)
HCT: 39.3 % (ref 34.8–46.6)
HGB: 12.8 g/dL (ref 11.6–15.9)
LYMPH%: 41.5 % (ref 14.0–49.7)
MCH: 30.7 pg (ref 25.1–34.0)
MCHC: 32.5 g/dL (ref 31.5–36.0)
MCV: 94.2 fL (ref 79.5–101.0)
MONO#: 0.4 10*3/uL (ref 0.1–0.9)
MONO%: 10.3 % (ref 0.0–14.0)
NEUT#: 1.7 10*3/uL (ref 1.5–6.5)
NEUT%: 46.8 % (ref 38.4–76.8)
Platelets: 188 10*3/uL (ref 145–400)
RBC: 4.18 10*6/uL (ref 3.70–5.45)
RDW: 13.5 % (ref 11.2–14.5)
WBC: 3.6 10*3/uL — ABNORMAL LOW (ref 3.9–10.3)
lymph#: 1.5 10*3/uL (ref 0.9–3.3)

## 2013-07-01 LAB — CHCC SMEAR

## 2013-07-01 NOTE — Consult Note (Signed)
Reason for Referral: Lymphocytosis.   HPI: 48 year old woman currently of [place] where she lived the majority of her life. She used to work in a [employer] but have been on medical disability since 2007. 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 She developed renal failure and joint involvement and had to be treated aggressively with which she calls systemic chemotherapy. She is currently not on dialysis and her last creatinine was around 1.04. She is currently maintained on Chloroquine and prednisone. She was also noted there to have slight lymphocytosis and December of 2014. Her total white cell count was 4.5 and lymphocyte percentage was 58.1. A repeat CBC on 06/09/2013 showed a total white cell count of 4.8 and a lymphocyte percentage of 60.3% she had a normal hemoglobin and normal platelets. Her neutrophil slightly low at 24% but her myositis are elevated at 13.7. Looking back at the previous CBCs in 2012 she had similar findings with a white cell count 4.0 and a lymphocyte percentage of 54%. This normalized back in June of 2014 with a lymphocyte percentage of 30%. Clinically, she is asymptomatic as related to this. She has not reported any lymphadenopathy or abdominal pain. She did not report any weight loss or early satiety. She does not report any fevers or chills or sweats. She has not reported any recent hospitalizations. She did have a shingles [place] which corresponded possibly to her lymphocytosis. She did not report any chest pain or difficulty breathing. She has not reported any hematochezia or melena. Is not reporting decline in her performance status or activity level.   Past Medical History  Diagnosis Date  . CIN I (cervical intraepithelial neoplasia I) 1990  . Dysmenorrhea   . Atrophic vaginitis   . Migraines   . Lupus nephritis   . Fibroid   . Arthritis   . Premature menopause age 9    following chemotherapy for  Lupus  . Hypertension   :  Past Surgical History  Procedure Laterality Date  . Cesarean section    . Myomectomy      Hysteroscopic myomectomy  . Hysteroscopy      Myomectomy  . Tubal ligation    . Colposcopy    :   Current Outpatient Prescriptions  Medication Sig Dispense Refill  . amLODipine (NORVASC) 5 MG tablet Take 5 mg by mouth daily.      [name] Kitchen aspirin 81 MG tablet Take 81 mg by mouth daily.      [name] Kitchen BENAZEPRIL-HYDROCHLOROTHIAZIDE PO Take 40 mg by mouth daily.       . chloroquine (ARALEN) 250 MG tablet Take 250 mg by mouth daily.      . Cholecalciferol (VITAMIN D PO) Take 1,000 Int'l Units by mouth daily.       . diclofenac sodium (VOLTAREN) 1 % GEL Apply topically.      . furosemide (LASIX) 80 MG tablet Take 40 mg by mouth 2 (two) times daily.       [name] Kitchen gabapentin (NEURONTIN) 800 MG tablet Take 1 tablet (800 mg total) by mouth 3 (three) times daily.  90 tablet  12  . HYDROcodone-acetaminophen (NORCO) 10-325 MG per tablet Take 1 tablet by mouth every 6 (six) hours as needed for pain. 1-3 Tabs per day as needed      . L-Methylfolate-B6-B12 (METANX PO) Take 1 tablet by mouth daily.      [name] Kitchen lidocaine (LIDODERM) 5 % Place 1 patch onto the skin daily.  Remove & Discard patch within 12 hours or as directed by MD      . Magnesium 400 MG CAPS Take 1 capsule by mouth daily.  30 capsule  5  . mirabegron ER (MYRBETRIQ) 50 MG TB24 tablet Take 50 mg by mouth daily.      . Multiple Vitamin (MULTIVITAMIN) tablet Take 1 tablet by mouth daily.      . pantoprazole (PROTONIX) 40 MG tablet Take 1 tablet (40 mg total) by mouth daily.  30 tablet  5  . Polysaccharide Iron Complex (FERREX 150 PO) Take 150 mg by mouth daily.      . potassium chloride SA (K-DUR,KLOR-CON) 20 MEQ tablet Take 1 tablet (20 mEq total) by mouth daily.  30 tablet  11  . predniSONE (DELTASONE) 5 MG tablet Take 5 mg by mouth daily.      [name] Kitchen topiramate (TOPAMAX) 25 MG tablet Take 25 mg by mouth daily.      . Transdermal Base CREA Apply  topically.      . traZODone (DESYREL) 100 MG tablet Take 100 mg by mouth at bedtime.      [name] Kitchen oxybutynin (DITROPAN) 5 MG tablet take 1 tablet by mouth three times a day  90 tablet  0  . potassium chloride (KLOR-CON) 20 MEQ packet Take 20 mEq by mouth once.  1 tablet  5  . [DISCONTINUED] oxybutynin (DITROPAN) 5 MG tablet take 1 tablet by mouth three times a day  90 tablet  6   No current facility-administered medications for this visit.     No Known Allergies:  Family History  Problem Relation Age of Onset  . Hypertension Mother   . Asthma Mother   . Diabetes Father   . Hypertension Father   . Heart disease Father   :  History   Social History  . Marital Status: Married    Spouse Name: [name]    Number of Children: 2  . Years of Education: College   Occupational History  .      disability   Social History Main Topics  . Smoking status: Never Smoker   . Smokeless tobacco: Never Used  . Alcohol Use: No  . Drug Use: No  . Sexual Activity: Yes    Birth Control/ Protection: Surgical     Comment: Tubal lig   Other Topics Concern  . Not on file   Social History Narrative   Patient lives at home with her family.   Caffeine Use: 2 cups of coffee daily.   :  Constitutional: positive for fatigue, negative for anorexia, night sweats, sweats and weight loss Eyes: negative for icterus and irritation Ears, nose, mouth, throat, and face: negative for epistaxis, hearing loss, hoarseness and nasal congestion Respiratory: negative for dyspnea on exertion, hemoptysis and wheezing Cardiovascular: negative for chest pain, exertional chest pressure/discomfort, orthopnea and palpitations Gastrointestinal: negative for abdominal pain, melena and vomiting Genitourinary:negative for frequency, hematuria and hesitancy Integument/breast: positive for pruritus and rash, negative for skin color change and skin lesion(s) Hematologic/lymphatic: positive for easy bruising, negative for bleeding,  lymphadenopathy and petechiae Musculoskeletal:positive for stiff joints, negative for back pain, bone pain and muscle weakness Neurological: positive for paresthesia, negative for coordination problems, seizures and tremors Behavioral/Psych: negative for anxiety and depression Endocrine: negative for temperature intolerance Allergic/Immunologic: negative for angioedema and urticaria  Exam: Blood pressure 112/70, pulse 52, temperature 97.8 F (36.6 C), temperature source Oral, resp. rate 20, height $RemoveBe'5\' 4"'UkhEOwgEr$  (1.626 m), weight 151 lb 14.4 oz (68.901  kg), SpO2 97.00%. General appearance: alert, cooperative and appears stated age Head: Normocephalic, without obvious abnormality, atraumatic Throat: lips, mucosa, and tongue normal; teeth and gums normal Neck: no adenopathy, no carotid bruit, no JVD, supple, symmetrical, trachea midline and thyroid not enlarged, symmetric, no tenderness/mass/nodules Back: symmetric, no curvature. ROM normal. No CVA tenderness. Resp: clear to auscultation bilaterally Chest wall: no tenderness Cardio: regular rate and rhythm, S1, S2 normal, no murmur, click, rub or gallop GI: soft, non-tender; bowel sounds normal; no masses,  no organomegaly Extremities: extremities normal, atraumatic, no cyanosis or edema Pulses: 2+ and symmetric Skin: Skin color, texture, turgor normal. No rashes or lesions or ecchymoses - lower leg(s) bilateral Lymph nodes: Cervical, supraclavicular, and axillary nodes normal. Neurologic: Grossly normal   Recent Labs  07/01/13 1104  WBC 3.6*  HGB 12.8  HCT 39.3  PLT 188     Assessment and Plan:   48 year old woman with the following issues:  1. Fluctuating lymphocytosis. Her lymphocytes were elevated back in 2012 around 54%. Subsequently normalized in 2014. It went up slightly between December and March of 2015 after an episode of shingles infection. Her white cell count today were reviewed and her total white cell count was 3.6 but her  lymphocyte percentage is 41.5 which is within normal range. The rest of her differential was perfectly normal. The differential diagnosis was discussed with the patient and her husband today. Reactive lymphocytosis is the most likely etiology here. Skin be related to her recent virus infection with shingles. Could also be related to her lupus medications. A lymphoproliferative disorder I believe is less likely. She did not have any clinical signs of symptoms of that. Her lymphocytosis have spontaneously resolved which is not characteristics of any lymphoproliferative disorder. For completeness sake, I center blood for flow cytometry to rule out any lymphoproliferative disorder. If this is normal, no further investigation or workup will be necessary.  2. Leukocytopenia: Her white cell count have been fluctuating slightly low today but have been normal in a few occasions. This is probably related to her lupus and lupus treatment. I see no indication for a bone marrow biopsy or further investigation.  3. Followup: Will be as needed in the future or if her workup reveal any other abnormalities. I will be happy to see her in the future as needed and it was a pleasure assisting in her care.

## 2013-07-01 NOTE — Progress Notes (Signed)
Checked in new patient with no financial issues. She has not been out of country and as appt card.,

## 2013-07-01 NOTE — Progress Notes (Signed)
Please see consult note.  

## 2013-07-04 LAB — FLOW CYTOMETRY

## 2013-07-07 ENCOUNTER — Other Ambulatory Visit (HOSPITAL_COMMUNITY)
Admission: RE | Admit: 2013-07-07 | Discharge: 2013-07-07 | Disposition: A | Discharge status: Home / Self Care | Payer: 59 | Source: Ambulatory Visit | Attending: Gynecology | Admitting: Gynecology

## 2013-07-07 ENCOUNTER — Ambulatory Visit (INDEPENDENT_AMBULATORY_CARE_PROVIDER_SITE_OTHER): Payer: 59 | Admitting: Gynecology

## 2013-07-07 ENCOUNTER — Encounter: Payer: Self-pay | Admitting: Gynecology

## 2013-07-07 VITALS — BP 104/68 | Ht 64.0 in | Wt 155.0 lb

## 2013-07-07 DIAGNOSIS — M899 Disorder of bone, unspecified: Secondary | ICD-10-CM

## 2013-07-07 DIAGNOSIS — D251 Intramural leiomyoma of uterus: Secondary | ICD-10-CM

## 2013-07-07 DIAGNOSIS — Z01419 Encounter for gynecological examination (general) (routine) without abnormal findings: Secondary | ICD-10-CM | POA: Diagnosis present

## 2013-07-07 DIAGNOSIS — N952 Postmenopausal atrophic vaginitis: Secondary | ICD-10-CM

## 2013-07-07 DIAGNOSIS — Z124 Encounter for screening for malignant neoplasm of cervix: Secondary | ICD-10-CM

## 2013-07-07 DIAGNOSIS — Z1151 Encounter for screening for human papillomavirus (HPV): Secondary | ICD-10-CM | POA: Insufficient documentation

## 2013-07-07 DIAGNOSIS — E28319 Asymptomatic premature menopause: Secondary | ICD-10-CM | POA: Diagnosis not present

## 2013-07-07 DIAGNOSIS — M949 Disorder of cartilage, unspecified: Secondary | ICD-10-CM

## 2013-07-07 DIAGNOSIS — N318 Other neuromuscular dysfunction of bladder: Secondary | ICD-10-CM

## 2013-07-07 DIAGNOSIS — M8580 Other specified disorders of bone density and structure, unspecified site: Secondary | ICD-10-CM

## 2013-07-07 DIAGNOSIS — N3281 Overactive bladder: Secondary | ICD-10-CM

## 2013-07-07 NOTE — Patient Instructions (Signed)
Follow up for bone density as scheduled. Followup in one year for annual exam, sooner as needed.  Health Maintenance, Female A healthy lifestyle and preventative care can promote health and wellness.  Maintain regular health, dental, and eye exams.  Eat a healthy diet. Foods like vegetables, fruits, whole grains, low-fat dairy products, and lean protein foods contain the nutrients you need without too many calories. Decrease your intake of foods high in solid fats, added sugars, and salt. Get information about a proper diet from your caregiver, if necessary.  Regular physical exercise is one of the most important things you can do for your health. Most adults should get at least 150 minutes of moderate-intensity exercise (any activity that increases your heart rate and causes you to sweat) each week. In addition, most adults need muscle-strengthening exercises on 2 or more days a week.   Maintain a healthy weight. The body mass index (BMI) is a screening tool to identify possible weight problems. It provides an estimate of body fat based on height and weight. Your caregiver can help determine your BMI, and can help you achieve or maintain a healthy weight. For adults 20 years and older:  A BMI below 18.5 is considered underweight.  A BMI of 18.5 to 24.9 is normal.  A BMI of 25 to 29.9 is considered overweight.  A BMI of 30 and above is considered obese.  Maintain normal blood lipids and cholesterol by exercising and minimizing your intake of saturated fat. Eat a balanced diet with plenty of fruits and vegetables. Blood tests for lipids and cholesterol should begin at age 20 and be repeated every 5 years. If your lipid or cholesterol levels are high, you are over 50, or you are a high risk for heart disease, you may need your cholesterol levels checked more frequently.Ongoing high lipid and cholesterol levels should be treated with medicines if diet and exercise are not effective.  If you  smoke, find out from your caregiver how to quit. If you do not use tobacco, do not start.  Lung cancer screening is recommended for adults aged 55 80 years who are at high risk for developing lung cancer because of a history of smoking. Yearly low-dose computed tomography (CT) is recommended for people who have at least a 30-pack-year history of smoking and are a current smoker or have quit within the past 15 years. A pack year of smoking is smoking an average of 1 pack of cigarettes a day for 1 year (for example: 1 pack a day for 30 years or 2 packs a day for 15 years). Yearly screening should continue until the smoker has stopped smoking for at least 15 years. Yearly screening should also be stopped for people who develop a health problem that would prevent them from having lung cancer treatment.  If you are pregnant, do not drink alcohol. If you are breastfeeding, be very cautious about drinking alcohol. If you are not pregnant and choose to drink alcohol, do not exceed 1 drink per day. One drink is considered to be 12 ounces (355 mL) of beer, 5 ounces (148 mL) of wine, or 1.5 ounces (44 mL) of liquor.  Avoid use of street drugs. Do not share needles with anyone. Ask for help if you need support or instructions about stopping the use of drugs.  High blood pressure causes heart disease and increases the risk of stroke. Blood pressure should be checked at least every 1 to 2 years. Ongoing high blood pressure should   with medicines, if weight loss and exercise are not effective.  If you are 59 to 49 years old, ask your caregiver if you should take aspirin to prevent strokes.  Diabetes screening involves taking a blood sample to check your fasting blood sugar level. This should be done once every 3 years, after age 15, if you are within normal weight and without risk factors for diabetes. Testing should be considered at a younger age or be carried out more frequently if you are overweight and  have at least 1 risk factor for diabetes.  Breast cancer screening is essential preventative care for women. You should practice "breast self-awareness." This means understanding the normal appearance and feel of your breasts and may include breast self-examination. Any changes detected, no matter how small, should be reported to a caregiver. Women in their 41s and 30s should have a clinical breast exam (CBE) by a caregiver as part of a regular health exam every 1 to 3 years. After age 44, women should have a CBE every year. Starting at age 59, women should consider having a mammogram (breast X-ray) every year. Women who have a family history of breast cancer should talk to their caregiver about genetic screening. Women at a high risk of breast cancer should talk to their caregiver about having an MRI and a mammogram every year.  Breast cancer gene (BRCA)-related cancer risk assessment is recommended for women who have family members with BRCA-related cancers. BRCA-related cancers include breast, ovarian, tubal, and peritoneal cancers. Having family members with these cancers may be associated with an increased risk for harmful changes (mutations) in the breast cancer genes BRCA1 and BRCA2. Results of the assessment will determine the need for genetic counseling and BRCA1 and BRCA2 testing.  The Pap test is a screening test for cervical cancer. Women should have a Pap test starting at age 30. Between ages 95 and 10, Pap tests should be repeated every 2 years. Beginning at age 50, you should have a Pap test every 3 years as long as the past 3 Pap tests have been normal. If you had a hysterectomy for a problem that was not cancer or a condition that could lead to cancer, then you no longer need Pap tests. If you are between ages 53 and 2, and you have had normal Pap tests going back 10 years, you no longer need Pap tests. If you have had past treatment for cervical cancer or a condition that could lead to  cancer, you need Pap tests and screening for cancer for at least 20 years after your treatment. If Pap tests have been discontinued, risk factors (such as a new sexual partner) need to be reassessed to determine if screening should be resumed. Some women have medical problems that increase the chance of getting cervical cancer. In these cases, your caregiver may recommend more frequent screening and Pap tests.  The human papillomavirus (HPV) test is an additional test that may be used for cervical cancer screening. The HPV test looks for the virus that can cause the cell changes on the cervix. The cells collected during the Pap test can be tested for HPV. The HPV test could be used to screen women aged 32 years and older, and should be used in women of any age who have unclear Pap test results. After the age of 52, women should have HPV testing at the same frequency as a Pap test.  Colorectal cancer can be detected and often prevented. Most routine colorectal  cancer screening begins at the age of 15 and continues through age 41. However, your caregiver may recommend screening at an earlier age if you have risk factors for colon cancer. On a yearly basis, your caregiver may provide home test kits to check for hidden blood in the stool. Use of a small camera at the end of a tube, to directly examine the colon (sigmoidoscopy or colonoscopy), can detect the earliest forms of colorectal cancer. Talk to your caregiver about this at age 59, when routine screening begins. Direct examination of the colon should be repeated every 5 to 10 years through age 74, unless early forms of pre-cancerous polyps or small growths are found.  Hepatitis C blood testing is recommended for all people born from [birth year] through 1966 and any individual with known risks for hepatitis C.  Practice safe sex. Use condoms and avoid high-risk sexual practices to reduce the spread of sexually transmitted infections (STIs). Sexually active women  aged 77 and younger should be checked for Chlamydia, which is a common sexually transmitted infection. Older women with new or multiple partners should also be tested for Chlamydia. Testing for other STIs is recommended if you are sexually active and at increased risk.  Osteoporosis is a disease in which the bones lose minerals and strength with aging. This can result in serious bone fractures. The risk of osteoporosis can be identified using a bone density scan. Women ages 30 and over and women at risk for fractures or osteoporosis should discuss screening with their caregivers. Ask your caregiver whether you should be taking a calcium supplement or vitamin D to reduce the rate of osteoporosis.  Menopause can be associated with physical symptoms and risks. Hormone replacement therapy is available to decrease symptoms and risks. You should talk to your caregiver about whether hormone replacement therapy is right for you.  Use sunscreen. Apply sunscreen liberally and repeatedly throughout the day. You should seek shade when your shadow is shorter than you. Protect yourself by wearing long sleeves, pants, a wide-brimmed hat, and sunglasses year round, whenever you are outdoors.  Notify your caregiver of new moles or changes in moles, especially if there is a change in shape or color. Also notify your caregiver if a mole is larger than the size of a pencil eraser.  Stay current with your immunizations. Document Released: 09/26/2010 Document Revised: 07/08/2012 Document Reviewed: 09/26/2010 [facility] Patient Information 2014 [place].

## 2013-07-07 NOTE — Progress Notes (Signed)
Marie Peterson 04-20-1965 [id number]        48 y.o.  [id number] for followup exam.  Several issues that noted below  Past medical history,surgical history, problem list, medications, allergies, family history and social history were all reviewed and documented as reviewed in the EPIC chart.  ROS:  12 system ROS performed with pertinent positives and negatives included in the history, assessment and plan.  Additional significant findings : Occasional constipation, frequent urination, vaginal dryness and dyspareunia  Included Systems: General, HEENT, Neck, Cardiovascular, Pulmonary, Gastrointestinal, Genitourinary, Musculoskeletal, Dermatologic, Endocrine, Hematological, Neurologic, Psychiatric  Exam: Sharrie Rothman assistant Filed Vitals:   07/07/13 0857  BP: 104/68  Height: 5\' 4"  (1.626 m)  Weight: 155 lb (70.308 kg)   General appearance:  Normal affect, orientation and appearance. Skin: Grossly normal HEENT: Normal without gross oral lesions, cervical or supraclavicular adenopathy. Thyroid normal.  Ears/nose appear normal Lungs:  Clear without wheezing, rales or rhonchi Cardiac: RR, without RMG Abdominal:  Soft, nontender, without masses, guarding, rebound, organomegaly or hernia Breasts:  Examined lying and sitting without masses, retractions, discharge or axillary adenopathy. Pelvic:  Ext/BUS/vagina with atrophic changes.  Cervix flush with upper vagina. Pap/HPV  Uterus anteverted, normal size, shape and contour, midline and mobile nontender   Adnexa  Without masses or tenderness    Anus and perineum  Normal   Rectovaginal  Normal sphincter tone without palpated masses or tenderness.    Assessment/Plan:  48 y.o. [id number] female for followup exam.   1. Premature menopause/dyspareunia. Following chemotherapy for her lupus patient underwent premature menopause. Transiently was on HRT but discontinued. She is having dyspareunia but no significant hot flushes or night sweats. Had tried estrogen  vaginal cream but ran out of samples.  I reviewed the issue of HRT with her to include benefits/risks. The WHI study with increased risk of stroke heart attack DVT and breast cancer discussed. Benefits of HRT in younger population with premature menopause as far as cardiovascular protection and bone health also discussed. Patient is not interested in HRT. Reviewed possible vaginal estrogen for [place]. Patient is not interested at this time but will call if she wants to retry the vaginal estrogen. The issues of absorption and systemic effects were reviewed. Patient has done no vaginal bleeding and she knows to report any vaginal bleeding 2. Leiomyomata. Patient has history of hysteroscopic myomectomy and ultrasound previously showing a 42 mm myoma. Her exam today is normal. Continue to monitor with annual exams. 3. Detrusor instability. Had previously been on several medications but now is on the Mybetriq doing well and will continue on this. Check urinalysis today. 4. History of osteopenia. DEXA 2010 with T score -1.1. Had significant bone loss preceding this and was placed on bisphosphonates. Took these transiently for several years but discontinued. Her last DEXA 2012 was normal. Recommend repeating DEXA now and she will schedule. She is on prednisone 6 mg daily with premature menopause and is at risk for developing osteoporosis. Increase calcium vitamin D reviewed. 5. Pap smear 2012. Pap/HPV today. Cervix is flush with the upper vagina and would not be surprised if no endocervical cells returned. History of CIN-1 1990 with negative Pap smears since then. 6. Mammography 11/2012. Continue with annual mammography. 7. Lupus. Actively being followed by her other physicians and will continue to do so. 8. Health maintenance. No blood work done today as she has it all done through her other physicians. Followup one year, sooner as needed.   Note: This document was prepared with digital  dictation and possible  smart phrase technology. Any transcriptional errors that result from this process are unintentional.   Anastasio Auerbach MD, 9:24 AM 07/07/2013

## 2013-07-18 DIAGNOSIS — R030 Elevated blood-pressure reading, without diagnosis of hypertension: Secondary | ICD-10-CM | POA: Diagnosis not present

## 2013-07-18 DIAGNOSIS — R350 Frequency of micturition: Secondary | ICD-10-CM | POA: Diagnosis not present

## 2013-07-18 DIAGNOSIS — M329 Systemic lupus erythematosus, unspecified: Secondary | ICD-10-CM | POA: Diagnosis not present

## 2013-07-18 DIAGNOSIS — G4700 Insomnia, unspecified: Secondary | ICD-10-CM | POA: Diagnosis not present

## 2013-07-31 ENCOUNTER — Other Ambulatory Visit: Payer: Self-pay | Admitting: Gynecology

## 2013-07-31 ENCOUNTER — Ambulatory Visit (INDEPENDENT_AMBULATORY_CARE_PROVIDER_SITE_OTHER): Payer: 59

## 2013-07-31 DIAGNOSIS — M899 Disorder of bone, unspecified: Secondary | ICD-10-CM | POA: Diagnosis not present

## 2013-07-31 DIAGNOSIS — M329 Systemic lupus erythematosus, unspecified: Secondary | ICD-10-CM

## 2013-07-31 DIAGNOSIS — M949 Disorder of cartilage, unspecified: Secondary | ICD-10-CM

## 2013-07-31 DIAGNOSIS — M8580 Other specified disorders of bone density and structure, unspecified site: Secondary | ICD-10-CM

## 2013-08-06 ENCOUNTER — Encounter: Payer: Self-pay | Admitting: Gynecology

## 2013-08-11 ENCOUNTER — Telehealth: Payer: Self-pay | Admitting: *Deleted

## 2013-08-11 NOTE — Telephone Encounter (Signed)
Pt called with questions regarding bone density, all questions answered.

## 2013-08-21 ENCOUNTER — Other Ambulatory Visit: Payer: Self-pay | Admitting: Cardiovascular Disease

## 2013-08-21 NOTE — Telephone Encounter (Signed)
Rx was sent to pharmacy electronically. 

## 2013-09-16 DIAGNOSIS — M329 Systemic lupus erythematosus, unspecified: Secondary | ICD-10-CM | POA: Diagnosis not present

## 2013-10-14 DIAGNOSIS — Z0000 Encounter for general adult medical examination without abnormal findings: Secondary | ICD-10-CM | POA: Diagnosis not present

## 2013-10-14 DIAGNOSIS — E559 Vitamin D deficiency, unspecified: Secondary | ICD-10-CM | POA: Diagnosis not present

## 2013-11-04 ENCOUNTER — Other Ambulatory Visit: Payer: Self-pay | Admitting: Diagnostic Neuroimaging

## 2013-11-11 DIAGNOSIS — M329 Systemic lupus erythematosus, unspecified: Secondary | ICD-10-CM | POA: Diagnosis not present

## 2013-11-11 DIAGNOSIS — M25569 Pain in unspecified knee: Secondary | ICD-10-CM | POA: Diagnosis not present

## 2013-11-11 DIAGNOSIS — R030 Elevated blood-pressure reading, without diagnosis of hypertension: Secondary | ICD-10-CM | POA: Diagnosis not present

## 2013-11-11 DIAGNOSIS — R51 Headache: Secondary | ICD-10-CM | POA: Diagnosis not present

## 2013-12-09 ENCOUNTER — Telehealth: Payer: Self-pay | Admitting: *Deleted

## 2013-12-09 NOTE — Telephone Encounter (Signed)
Pt calling to follow from [place] 07/07/13 she would like to know of OTC cream for vaginal dryness? Pt said Rx medication are too expensive she requested samples, but I explained to pt that we no longer have samples. Please advise

## 2013-12-09 NOTE — Telephone Encounter (Signed)
1 over-the-counter vaginal moisturizer is Replens. There are several others that she can find in the same section of the pharmacy.

## 2013-12-09 NOTE — Telephone Encounter (Signed)
Pt informed with the below note. 

## 2013-12-10 DIAGNOSIS — Z23 Encounter for immunization: Secondary | ICD-10-CM | POA: Diagnosis not present

## 2013-12-10 DIAGNOSIS — R32 Unspecified urinary incontinence: Secondary | ICD-10-CM | POA: Diagnosis not present

## 2013-12-10 DIAGNOSIS — E559 Vitamin D deficiency, unspecified: Secondary | ICD-10-CM | POA: Diagnosis not present

## 2013-12-10 DIAGNOSIS — M329 Systemic lupus erythematosus, unspecified: Secondary | ICD-10-CM | POA: Diagnosis not present

## 2013-12-10 DIAGNOSIS — I10 Essential (primary) hypertension: Secondary | ICD-10-CM | POA: Diagnosis not present

## 2013-12-12 DIAGNOSIS — M25569 Pain in unspecified knee: Secondary | ICD-10-CM | POA: Diagnosis not present

## 2013-12-12 DIAGNOSIS — M1710 Unilateral primary osteoarthritis, unspecified knee: Secondary | ICD-10-CM | POA: Diagnosis not present

## 2013-12-30 ENCOUNTER — Other Ambulatory Visit: Payer: Self-pay

## 2013-12-30 DIAGNOSIS — Z1239 Encounter for other screening for malignant neoplasm of breast: Secondary | ICD-10-CM

## 2014-01-01 DIAGNOSIS — Z79899 Other long term (current) drug therapy: Secondary | ICD-10-CM | POA: Diagnosis not present

## 2014-01-01 DIAGNOSIS — M3210 Systemic lupus erythematosus, organ or system involvement unspecified: Secondary | ICD-10-CM | POA: Diagnosis not present

## 2014-01-07 ENCOUNTER — Telehealth: Payer: Self-pay | Admitting: Cardiovascular Disease

## 2014-01-07 DIAGNOSIS — R001 Bradycardia, unspecified: Secondary | ICD-10-CM | POA: Diagnosis not present

## 2014-01-07 DIAGNOSIS — R21 Rash and other nonspecific skin eruption: Secondary | ICD-10-CM | POA: Diagnosis not present

## 2014-01-07 DIAGNOSIS — T148 Other injury of unspecified body region: Secondary | ICD-10-CM | POA: Diagnosis not present

## 2014-01-07 NOTE — Telephone Encounter (Signed)
10.14.15 Received 15 pages of records from Dr [name] [facility] for Appointment with Dr Gwenlyn Found on 02/18/14  Records given to [facility] in Medical Records for Dr Kennon Holter schedule of 02/18/14 lp

## 2014-01-14 ENCOUNTER — Other Ambulatory Visit: Payer: Self-pay

## 2014-01-14 DIAGNOSIS — Z1231 Encounter for screening mammogram for malignant neoplasm of breast: Secondary | ICD-10-CM

## 2014-01-15 ENCOUNTER — Encounter: Payer: Self-pay | Admitting: *Deleted

## 2014-01-16 ENCOUNTER — Ambulatory Visit: Payer: 59

## 2014-01-26 ENCOUNTER — Encounter: Payer: Self-pay | Admitting: Gynecology

## 2014-02-01 ENCOUNTER — Other Ambulatory Visit: Payer: Self-pay | Admitting: Cardiovascular Disease

## 2014-02-02 NOTE — Telephone Encounter (Signed)
E sent to pharmacy 

## 2014-02-18 ENCOUNTER — Encounter: Payer: Self-pay | Admitting: Cardiovascular Disease

## 2014-02-18 ENCOUNTER — Ambulatory Visit (INDEPENDENT_AMBULATORY_CARE_PROVIDER_SITE_OTHER): Payer: 59 | Admitting: Cardiovascular Disease

## 2014-02-18 VITALS — BP 94/64 | HR 52 | Ht 64.0 in | Wt 145.2 lb

## 2014-02-18 DIAGNOSIS — I10 Essential (primary) hypertension: Secondary | ICD-10-CM | POA: Diagnosis not present

## 2014-02-18 NOTE — Assessment & Plan Note (Signed)
History of hypertension with blood pressure measured sat of 94/64. She is on Asacol 40 mg daily. She is not symptomatic. Maintain current medication at current dosing. Her heart rate is in the low 50s which she has had on past office visits. She is on no rate lowering drugs. She is asymptomatic from this.

## 2014-02-18 NOTE — Progress Notes (Signed)
02/18/2014 Marie Peterson   [birth year]  [id number]  Primary Physician [name], MD Primary Cardiologist: Lorretta Harp MD Renae Gloss  HPI:  Marie Peterson is a 48 year old mildly overweight married an American female mother of 2 daughters one of them goes to the [employer]. And the other goes to [facility], who is formally a patient of Dr. Lowella Fairy. She currently is out on disability because of SLE. Her factors include treated hypertension. Her father had bypass surgery at age 19. She's never had a heart attack or stroke. She denies chest pain or shortness of breath. She does have GERD. Since I saw her in the office one year ago she has remained asymptomatic. Her primary care physician, Dr. Maudie Mercury, was concerned that she was relatively bradycardic but this is not a new finding and she has a symptomatically from this.   Current Outpatient Prescriptions  Medication Sig Dispense Refill  . amLODipine (NORVASC) 5 MG tablet Take 5 mg by mouth daily.    [name] Kitchen aspirin 81 MG tablet Take 81 mg by mouth daily.    . benazepril (LOTENSIN) 40 MG tablet Take 1 tablet by mouth daily.  1  . chloroquine (ARALEN) 250 MG tablet Take 250 mg by mouth daily.    . Cholecalciferol (VITAMIN D PO) Take 1,000 Int'l Units by mouth daily.     . diclofenac sodium (VOLTAREN) 1 % GEL Apply topically.    [name] Kitchen FERREX 150 150 MG capsule Take 1 tablet by mouth daily.  1  . furosemide (LASIX) 40 MG tablet Take 1 tablet by mouth 2 (two) times daily.  0  . gabapentin (NEURONTIN) 800 MG tablet take 1 tablet by mouth three times a day 90 tablet 6  . HYDROcodone-acetaminophen (NORCO) 10-325 MG per tablet Take 1 tablet by mouth every 6 (six) hours as needed for pain. 1-3 Tabs per day as needed    . hydroxychloroquine (PLAQUENIL) 200 MG tablet Take 2 tablets by mouth daily.  0  . L-Methylfolate-B6-B12 (METANX PO) Take 1 tablet by mouth daily.    [name] Kitchen lidocaine (LIDODERM) 5 % Place 1 patch onto the skin daily. Remove & Discard patch within  12 hours or as directed by MD    . magnesium oxide (MAG-OX) 400 (241.3 MG) MG tablet take 1 tablet by mouth once daily 30 tablet 3  . Multiple Vitamin (MULTIVITAMIN) tablet Take 1 tablet by mouth daily.    . nabumetone (RELAFEN) 500 MG tablet Take 500 mg by mouth 2 (two) times daily as needed.  0  . oxybutynin (DITROPAN-XL) 10 MG 24 hr tablet Take 1 tablet by mouth daily.  0  . pantoprazole (PROTONIX) 40 MG tablet take 1 tablet by mouth once daily 30 tablet 5  . potassium chloride SA (K-DUR,KLOR-CON) 20 MEQ tablet Take 1 tablet (20 mEq total) by mouth daily. 30 tablet 11  . predniSONE (DELTASONE) 1 MG tablet Take 1 tablet by mouth daily. Takes with Prednisone 5 mg.  0  . predniSONE (DELTASONE) 5 MG tablet Take 5 mg by mouth daily.    [name] Kitchen topiramate (TOPAMAX) 50 MG tablet Take 1 tablet by mouth daily.  0  . Transdermal Base CREA Apply topically.    . traZODone (DESYREL) 100 MG tablet Take 100 mg by mouth at bedtime.    [name] Kitchen zolpidem (AMBIEN) 10 MG tablet Take 1 tablet by mouth at bedtime as needed.  0  . [DISCONTINUED] oxybutynin (DITROPAN) 5 MG tablet take 1 tablet by mouth three times  a day 90 tablet 6   No current facility-administered medications for this visit.    No Known Allergies  History   Social History  . Marital Status: Married    Spouse Name: [name]    Number of Children: 2  . Years of Education: College   Occupational History  .      disability   Social History Main Topics  . Smoking status: Never Smoker   . Smokeless tobacco: Never Used  . Alcohol Use: No  . Drug Use: No  . Sexual Activity: Yes    Birth Control/ Protection: Surgical, Post-menopausal     Comment: Tubal lig   Other Topics Concern  . Not on file   Social History Narrative   Patient lives at home with her family.   Caffeine Use: 2 cups of coffee daily.      Review of Systems: General: negative for chills, fever, night sweats or weight changes.  Cardiovascular: negative for chest pain, dyspnea  on exertion, edema, orthopnea, palpitations, paroxysmal nocturnal dyspnea or shortness of breath Dermatological: negative for rash Respiratory: negative for cough or wheezing Urologic: negative for hematuria Abdominal: negative for nausea, vomiting, diarrhea, bright red blood per rectum, melena, or hematemesis Neurologic: negative for visual changes, syncope, or dizziness All other systems reviewed and are otherwise negative except as noted above.    Blood pressure 94/64, pulse 52, height 5\' 4"  (1.626 m), weight 145 lb 3.2 oz (65.862 kg).  General appearance: alert and no distress Neck: no adenopathy, no carotid bruit, no JVD, supple, symmetrical, trachea midline and thyroid not enlarged, symmetric, no tenderness/mass/nodules Lungs: clear to auscultation bilaterally Heart: regular rate and rhythm, S1, S2 normal, no murmur, click, rub or gallop Extremities: extremities normal, atraumatic, no cyanosis or edema  EKG sinus bradycardia 52 without ST or T-wave changes. I personally reviewed this EKG  ASSESSMENT AND PLAN:   Essential hypertension History of hypertension with blood pressure measured sat of 94/64. She is on Asacol 40 mg daily. She is not symptomatic. Maintain current medication at current dosing. Her heart rate is in the low 50s which she has had on past office visits. She is on no rate lowering drugs. She is asymptomatic from this.      Lorretta Harp MD FACP,FACC,FAHA, [facility] 02/18/2014 11:03 AM

## 2014-02-18 NOTE — Patient Instructions (Signed)
Dr Gwenlyn Found recommends that you schedule a follow-up appointment as needed.

## 2014-02-23 ENCOUNTER — Other Ambulatory Visit: Payer: Self-pay | Admitting: *Deleted

## 2014-02-23 MED ORDER — PANTOPRAZOLE SODIUM 40 MG PO TBEC: 40.0000 mg | DELAYED_RELEASE_TABLET | Freq: Every day | ORAL | Status: DC

## 2014-02-23 NOTE — Telephone Encounter (Signed)
Rx was sent to pharmacy electronically. 

## 2014-03-03 ENCOUNTER — Encounter: Payer: Self-pay | Admitting: Cardiovascular Disease

## 2014-03-25 ENCOUNTER — Ambulatory Visit
Admission: RE | Admit: 2014-03-25 | Discharge: 2014-03-25 | Disposition: A | Discharge status: Home / Self Care | Payer: 59 | Source: Ambulatory Visit

## 2014-03-25 DIAGNOSIS — Z1231 Encounter for screening mammogram for malignant neoplasm of breast: Secondary | ICD-10-CM

## 2014-03-31 ENCOUNTER — Other Ambulatory Visit: Payer: Self-pay | Admitting: Cardiovascular Disease

## 2014-03-31 NOTE — Telephone Encounter (Signed)
Rx(s) sent to pharmacy electronically.  

## 2014-04-09 DIAGNOSIS — R946 Abnormal results of thyroid function studies: Secondary | ICD-10-CM | POA: Diagnosis not present

## 2014-04-09 DIAGNOSIS — E559 Vitamin D deficiency, unspecified: Secondary | ICD-10-CM | POA: Diagnosis not present

## 2014-04-09 DIAGNOSIS — I10 Essential (primary) hypertension: Secondary | ICD-10-CM | POA: Diagnosis not present

## 2014-04-13 DIAGNOSIS — T148 Other injury of unspecified body region: Secondary | ICD-10-CM | POA: Diagnosis not present

## 2014-04-13 DIAGNOSIS — G4700 Insomnia, unspecified: Secondary | ICD-10-CM | POA: Diagnosis not present

## 2014-04-13 DIAGNOSIS — R946 Abnormal results of thyroid function studies: Secondary | ICD-10-CM | POA: Diagnosis not present

## 2014-04-13 DIAGNOSIS — E559 Vitamin D deficiency, unspecified: Secondary | ICD-10-CM | POA: Diagnosis not present

## 2014-05-01 ENCOUNTER — Ambulatory Visit: Payer: 59 | Admitting: Diagnostic Neuroimaging

## 2014-05-01 ENCOUNTER — Ambulatory Visit (INDEPENDENT_AMBULATORY_CARE_PROVIDER_SITE_OTHER): Payer: 59 | Admitting: Neurology

## 2014-05-01 VITALS — BP 114/67 | HR 67 | Ht 64.0 in | Wt 147.0 lb

## 2014-05-01 DIAGNOSIS — G609 Hereditary and idiopathic neuropathy, unspecified: Secondary | ICD-10-CM | POA: Diagnosis not present

## 2014-05-01 DIAGNOSIS — B0229 Other postherpetic nervous system involvement: Secondary | ICD-10-CM

## 2014-05-01 DIAGNOSIS — G43109 Migraine with aura, not intractable, without status migrainosus: Secondary | ICD-10-CM

## 2014-05-01 MED ORDER — PREGABALIN 75 MG PO CAPS: 75.0000 mg | ORAL_CAPSULE | Freq: Two times a day (BID) | ORAL | Status: DC

## 2014-05-01 MED ORDER — SUMATRIPTAN SUCCINATE 100 MG PO TABS: 100.0000 mg | ORAL_TABLET | ORAL | Status: DC | PRN

## 2014-05-01 MED ORDER — SUMATRIPTAN SUCCINATE 100 MG PO TABS: ORAL_TABLET | ORAL | Status: DC

## 2014-05-01 NOTE — Progress Notes (Addendum)
[facility]    Provider:  Dr Jaynee Eagles Referring Provider: [name], MD Primary Care Physician:  [name], MD  CC:  Small-fiber neuropathy  HPI:  Marie Peterson is a 49 y.o. female here as a follow up for small-fiber neuropathy. Previously evaluated and seen by Dr. Leta Baptist and is transitioning to me. She is still on the neurontin and topical cream. She still gets the burning in her feet which is worsening. She had shingles and she still has pain in the right side of her torso. Now she also has burning and weakness in her hands too. She has migraines and Dr. Maudie Mercury started her on Topamax 65m twice a day and it is helping with the migraines.   Review of Systems: Patient complains of symptoms per HPI as well as the following symptoms: burning, headaches,  Fatigue, insomnia, aching muscles, joint pain, muscle cramps, walking difficulty, numbness, diarrheano SOB, no CP. Pertinent negatives per HPI. All others negative.  Review of previous records: MRI of the brain unremarkable, copper, ceruloplasmin, IFE, lyme, esr, crp, b6, ace, hep c, hepb, hiv, b1,   HISTORY OF PRESENT ILLNESS(Dr. Penumalli):   UPDATE 05/01/13: Since last visit, symptoms are stable. Pain is slightly better than last visit. Using topical neuropathy cream BID. Still on gabapentin 80102mTID. Overall satisfied with symptom control.  UPDATE 10/22/12: Since last visit, using compounded neuropathy cream on right torso (BID -TID) and it helps, better than lidoderm. Using gabapentin 60073mID. Feet still burning.  UPDATE 04/19/12: Since last visit, no new events. EMG, MRI, labs all neg. Tolerating gabapentin and lidoderm patch for post-herpetic neuralgia on right torso. Sxs in hands and feet unchanged.  PRIOR HPI: [age]ar old right-handed female with history of systemic lupus with glomerulonephritis, osteoarthritis, shingles, here for evaluation of diffuse numbness and pain in arms and legs since March 2012.  Patient  reports diffuse numbness, pain, burning sensation, and her bilateral arms and bilateral legs. Her left leg is more affected than the right. Sitting in the car or walking tend to aggravate the symptoms in her legs. She has no symptoms above her neck. She denies significant neck pain.  She has been on gabapentin since 2010 when she was diagnosed with shingles affecting her right torso. Her gabapentin, hydrocodone, etodoloc have not been increased since her numbness and pain have developed in March 2012. She had an outside NCS (BUE only; without EMG) which was normal.    History   Social History  . Marital Status: Married    Spouse Name: RodBarbaraann Rondo Number of Children: 2  . Years of Education: College   Occupational History  .      disability   Social History Main Topics  . Smoking status: Never Smoker   . Smokeless tobacco: Never Used  . Alcohol Use: No  . Drug Use: No  . Sexual Activity: Yes    Birth Control/ Protection: Surgical, Post-menopausal     Comment: Tubal lig   Other Topics Concern  . Not on file   Social History Narrative   Patient lives at home with her family.   Caffeine Use: 2 cups of coffee daily.     Family History  Problem Relation Age of Onset  . Hypertension Mother   . Asthma Mother   . Diabetes Father   . Hypertension Father   . Heart disease Father     Past Medical History  Diagnosis Date  . CIN I (cervical intraepithelial neoplasia I) 1990  .  Atrophic vaginitis   . Migraines   . Lupus nephritis   . Fibroid   . Arthritis   . Premature menopause age 62    following chemotherapy for Lupus  . Hypertension   . Osteopenia 07/2013    T score -1.7 FRAX 1.6%/0.1%    Past Surgical History  Procedure Laterality Date  . Cesarean section    . Myomectomy      Hysteroscopic myomectomy  . Hysteroscopy      Myomectomy  . Tubal ligation    . Colposcopy      Current Outpatient Prescriptions  Medication Sig Dispense Refill  . amLODipine  (NORVASC) 5 MG tablet Take 5 mg by mouth daily.    [name] Kitchen aspirin 81 MG tablet Take 81 mg by mouth daily.    . benazepril (LOTENSIN) 40 MG tablet Take 1 tablet by mouth daily.  1  . Cholecalciferol (VITAMIN D PO) Take 1,000 Int'l Units by mouth daily.     . diclofenac sodium (VOLTAREN) 1 % GEL Apply topically.    [name] Kitchen FERREX 150 150 MG capsule Take 1 tablet by mouth daily.  1  . furosemide (LASIX) 40 MG tablet Take 1 tablet by mouth 2 (two) times daily.  0  . gabapentin (NEURONTIN) 800 MG tablet take 1 tablet by mouth three times a day 90 tablet 6  . HYDROcodone-acetaminophen (NORCO) 10-325 MG per tablet Take 1 tablet by mouth every 6 (six) hours as needed for pain. 1-3 Tabs per day as needed    . hydroxychloroquine (PLAQUENIL) 200 MG tablet Take 2 tablets by mouth daily.  0  . L-Methylfolate-B6-B12 (METANX PO) Take 1 tablet by mouth daily.    [name] Kitchen lidocaine (LIDODERM) 5 % Place 1 patch onto the skin daily. Remove & Discard patch within 12 hours or as directed by MD    . magnesium oxide (MAG-OX) 400 (241.3 MG) MG tablet take 1 tablet by mouth once daily 30 tablet 3  . Multiple Vitamin (MULTIVITAMIN) tablet Take 1 tablet by mouth daily.    [name] Kitchen oxybutynin (DITROPAN-XL) 10 MG 24 hr tablet Take 1 tablet by mouth daily.  0  . pantoprazole (PROTONIX) 40 MG tablet Take 1 tablet (40 mg total) by mouth daily. 30 tablet 10  . potassium chloride SA (K-DUR,KLOR-CON) 20 MEQ tablet take 1 tablet by mouth once daily 30 tablet 11  . predniSONE (DELTASONE) 5 MG tablet Take 5 mg by mouth daily.    . temazepam (RESTORIL) 15 MG capsule Take 15 mg by mouth daily.  0  . topiramate (TOPAMAX) 50 MG tablet Take 1 tablet by mouth daily.  0  . nabumetone (RELAFEN) 500 MG tablet Take 500 mg by mouth 2 (two) times daily as needed.  0  . predniSONE (DELTASONE) 1 MG tablet Take 1 tablet by mouth daily. Takes with Prednisone 5 mg.  0  . pregabalin (LYRICA) 75 MG capsule Take 1 capsule (75 mg total) by mouth 2 (two) times daily. 60 capsule  3  . SUMAtriptan (IMITREX) 100 MG tablet Take one tab at onset of headache. May repeat in 2 hours if headache persists or recurs. 10 tablet 6  . Transdermal Base CREA Apply topically.    . traZODone (DESYREL) 100 MG tablet Take 100 mg by mouth at bedtime.    [name] Kitchen zolpidem (AMBIEN) 10 MG tablet Take 1 tablet by mouth at bedtime as needed.  0  . [DISCONTINUED] oxybutynin (DITROPAN) 5 MG tablet take 1 tablet by mouth three times a day 90  tablet 6   No current facility-administered medications for this visit.    Allergies as of 05/01/2014  . (No Known Allergies)    Vitals: BP 114/67 mmHg  Pulse 67  Ht _0  (1.626 m)  Wt 147 lb (66.679 kg)  BMI 25.22 kg/m2 Last Weight:  Wt Readings from Last 1 Encounters:  05/01/14 147 lb (66.679 kg)   Last Height:   Ht Readings from Last 1 Encounters:  05/01/14 _1  (1.626 m)   Physical exam: Exam: Gen: NAD, conversant, well nourised, obese, well groomed                     CV: RRR, no MRG. No Carotid Bruits. No peripheral edema, warm, nontender Eyes: Conjunctivae clear without exudates or hemorrhage  Neuro: Detailed Neurologic Exam  Speech:    Speech is normal; fluent and spontaneous with normal comprehension.  Cognition:    The patient is oriented to person, place, and time;     recent and remote memory intact;     language fluent;     normal attention, concentration,     fund of knowledge Cranial Nerves:    The pupils are equal, round, and reactive to light. The fundi are normal and spontaneous venous pulsations are present. Visual fields are full to finger confrontation. Extraocular movements are intact. Trigeminal sensation is intact and the muscles of mastication are normal. The face is symmetric. The palate elevates in the midline. Hearing intact. Voice is normal. Shoulder shrug is normal. The tongue has normal motion without fasciculations.   Coordination:no dysmetria noted.   Gait:    Heel-toe intact  Motor Observation:    No  asymmetry, no atrophy, and no involuntary movements noted. Tone:    Normal muscle tone.    Posture:    Posture is normal.    Strength:    Strength is V/V in the upper and lower limbs.      Sensation: decreased pp and temp in a sticking distribution     Reflex Exam:  DTR's:    Deep tendon reflexes in the upper and lower extremities are within normal limits bilaterally.   Toes:    The toes are downgoing bilaterally.   Clonus:    Clonus is absent.       Assessment/Plan:  49 year old female here for follow up of small-finer neuropathy. Her symptoms are worsening. She also has post-herpetic neuragia. Will order lidoderm patch for her torso. Will switch from gabapentin to Lyrica and see if this helps with her worsening symptoms. Should check B12 and folate at next appointment.   Addendum recent labs 06/09/2014 included BUNs 14 creatinine 0.92 EGFR 85 mL/m. She has a history of lupus nephritis. 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 [place], [place] [address]  Phone [phone] Fax [phone]  A total of 25 minutes was spent face-to-face with this patient. Over half this time was spent on counseling patient on the neuropathy diagnosis and different diagnostic and therapeutic options available.

## 2014-05-01 NOTE — Patient Instructions (Addendum)
Overall you are doing fairly well but I do want to suggest a few things today:   Remember to drink plenty of fluid, eat healthy meals and do not skip any meals. Try to eat protein with a every meal and eat a healthy snack such as fruit or nuts in between meals. Try to keep a regular sleep-wake schedule and try to exercise daily, particularly in the form of walking, 20-30 minutes a day, if you can.   As far as your medications are concerned, I would like to suggest: Lyrica: Start with 1 tab twice daily. Can increase to two tabs twice daily.  Imitrex: Please take one tablet at the onset of your headache. If it does not improve the symptoms please take one additional tablet. Do not take more then 2 tablets in 24hrs. Do not take use more then 2 to 3 days in a week.  As far as diagnostic testing:   I would like to see you back in 4-6 months, sooner if we need to. Please call us with any interim questions, concerns, problems, updates or refill requests.   Please also call us for any test results so we can go over those with you on the phone.  My clinical assistant and will answer any of your questions and relay your messages to me and also relay most of my messages to you.   Our phone number is [phone]. We also have an after hours call service for urgent matters and there is a physician on-call for urgent questions. For any emergencies you know to call 911 or go to the nearest emergency room

## 2014-05-02 ENCOUNTER — Encounter: Payer: Self-pay | Admitting: Neurology

## 2014-05-02 MED ORDER — LIDOCAINE 5 % EX PTCH: 3.0000 | MEDICATED_PATCH | CUTANEOUS | Status: DC

## 2014-05-21 ENCOUNTER — Other Ambulatory Visit: Payer: Self-pay | Admitting: Neurology

## 2014-05-21 ENCOUNTER — Telehealth: Payer: Self-pay | Admitting: Neurology

## 2014-05-21 ENCOUNTER — Telehealth: Payer: Self-pay | Admitting: *Deleted

## 2014-05-21 MED ORDER — PREGABALIN 75 MG PO CAPS: 150.0000 mg | ORAL_CAPSULE | Freq: Two times a day (BID) | ORAL | Status: DC

## 2014-05-21 NOTE — Telephone Encounter (Signed)
I faxed over the prescription for lyrica 75mg  2x/day at 455pm.

## 2014-05-21 NOTE — Telephone Encounter (Signed)
Terrence Dupont, can you print this out? It has to be faxed. Thank you

## 2014-05-21 NOTE — Telephone Encounter (Signed)
Patient stated  Rx pregabalin (LYRICA) 75 MG capsule taking 2 tablets twice daily, worked better than 1 tablet a day.  Requesting Rx sent to [facility] on [address].  Also stated if cost was too expensive, Dr. Jaynee Eagles would call Insurance and inform them patient was replacing Rx GABAPENTIN.  Please call and advise.

## 2014-06-05 ENCOUNTER — Other Ambulatory Visit: Payer: Self-pay | Admitting: *Deleted

## 2014-06-05 MED ORDER — MAGNESIUM OXIDE 400 (241.3 MG) MG PO TABS: 1.0000 | ORAL_TABLET | Freq: Every day | ORAL | Status: DC

## 2014-06-08 ENCOUNTER — Encounter: Payer: Self-pay | Admitting: *Deleted

## 2014-06-09 DIAGNOSIS — T148 Other injury of unspecified body region: Secondary | ICD-10-CM | POA: Diagnosis not present

## 2014-06-09 DIAGNOSIS — M329 Systemic lupus erythematosus, unspecified: Secondary | ICD-10-CM | POA: Diagnosis not present

## 2014-06-09 DIAGNOSIS — I10 Essential (primary) hypertension: Secondary | ICD-10-CM | POA: Diagnosis not present

## 2014-06-09 DIAGNOSIS — Z87448 Personal history of other diseases of urinary system: Secondary | ICD-10-CM | POA: Diagnosis not present

## 2014-06-09 DIAGNOSIS — G629 Polyneuropathy, unspecified: Secondary | ICD-10-CM | POA: Diagnosis not present

## 2014-07-20 DIAGNOSIS — M329 Systemic lupus erythematosus, unspecified: Secondary | ICD-10-CM | POA: Diagnosis not present

## 2014-07-20 DIAGNOSIS — L930 Discoid lupus erythematosus: Secondary | ICD-10-CM | POA: Diagnosis not present

## 2014-08-04 DIAGNOSIS — Z79899 Other long term (current) drug therapy: Secondary | ICD-10-CM | POA: Diagnosis not present

## 2014-08-10 ENCOUNTER — Ambulatory Visit (INDEPENDENT_AMBULATORY_CARE_PROVIDER_SITE_OTHER): Payer: 59 | Admitting: Gynecology

## 2014-08-10 ENCOUNTER — Other Ambulatory Visit (HOSPITAL_COMMUNITY)
Admission: RE | Admit: 2014-08-10 | Discharge: 2014-08-10 | Disposition: A | Discharge status: Home / Self Care | Payer: 59 | Source: Ambulatory Visit | Attending: Gynecology | Admitting: Gynecology

## 2014-08-10 ENCOUNTER — Encounter: Payer: Self-pay | Admitting: Gynecology

## 2014-08-10 VITALS — BP 122/76 | Ht 64.0 in | Wt 146.0 lb

## 2014-08-10 DIAGNOSIS — N952 Postmenopausal atrophic vaginitis: Secondary | ICD-10-CM

## 2014-08-10 DIAGNOSIS — N3281 Overactive bladder: Secondary | ICD-10-CM

## 2014-08-10 DIAGNOSIS — Z01419 Encounter for gynecological examination (general) (routine) without abnormal findings: Secondary | ICD-10-CM | POA: Diagnosis not present

## 2014-08-10 DIAGNOSIS — Z124 Encounter for screening for malignant neoplasm of cervix: Secondary | ICD-10-CM | POA: Diagnosis not present

## 2014-08-10 DIAGNOSIS — N318 Other neuromuscular dysfunction of bladder: Secondary | ICD-10-CM

## 2014-08-10 NOTE — Patient Instructions (Signed)
You may obtain a copy of any labs that were done today by logging onto MyChart as outlined in the instructions provided with your AVS (after visit summary). The office will not call with normal lab results but certainly if there are any significant abnormalities then we will contact you.   Health Maintenance, Female A healthy lifestyle and preventative care can promote health and wellness.  Maintain regular health, dental, and eye exams.  Eat a healthy diet. Foods like vegetables, fruits, whole grains, low-fat dairy products, and lean protein foods contain the nutrients you need without too many calories. Decrease your intake of foods high in solid fats, added sugars, and salt. Get information about a proper diet from your caregiver, if necessary.  Regular physical exercise is one of the most important things you can do for your health. Most adults should get at least 150 minutes of moderate-intensity exercise (any activity that increases your heart rate and causes you to sweat) each week. In addition, most adults need muscle-strengthening exercises on 2 or more days a week.   Maintain a healthy weight. The body mass index (BMI) is a screening tool to identify possible weight problems. It provides an estimate of body fat based on height and weight. Your caregiver can help determine your BMI, and can help you achieve or maintain a healthy weight. For adults 20 years and older:  A BMI below 18.5 is considered underweight.  A BMI of 18.5 to 24.9 is normal.  A BMI of 25 to 29.9 is considered overweight.  A BMI of 30 and above is considered obese.  Maintain normal blood lipids and cholesterol by exercising and minimizing your intake of saturated fat. Eat a balanced diet with plenty of fruits and vegetables. Blood tests for lipids and cholesterol should begin at age 61 and be repeated every 5 years. If your lipid or cholesterol levels are high, you are over 50, or you are a high risk for heart  disease, you may need your cholesterol levels checked more frequently.Ongoing high lipid and cholesterol levels should be treated with medicines if diet and exercise are not effective.  If you smoke, find out from your caregiver how to quit. If you do not use tobacco, do not start.  Lung cancer screening is recommended for adults aged 33 80 years who are at high risk for developing lung cancer because of a history of smoking. Yearly low-dose computed tomography (CT) is recommended for people who have at least a 30-pack-year history of smoking and are a current smoker or have quit within the past 15 years. A pack year of smoking is smoking an average of 1 pack of cigarettes a day for 1 year (for example: 1 pack a day for 30 years or 2 packs a day for 15 years). Yearly screening should continue until the smoker has stopped smoking for at least 15 years. Yearly screening should also be stopped for people who develop a health problem that would prevent them from having lung cancer treatment.  If you are pregnant, do not drink alcohol. If you are breastfeeding, be very cautious about drinking alcohol. If you are not pregnant and choose to drink alcohol, do not exceed 1 drink per day. One drink is considered to be 12 ounces (355 mL) of beer, 5 ounces (148 mL) of wine, or 1.5 ounces (44 mL) of liquor.  Avoid use of street drugs. Do not share needles with anyone. Ask for help if you need support or instructions about stopping  the use of drugs.  High blood pressure causes heart disease and increases the risk of stroke. Blood pressure should be checked at least every 1 to 2 years. Ongoing high blood pressure should be treated with medicines, if weight loss and exercise are not effective.  If you are 59 to 50 years old, ask your caregiver if you should take aspirin to prevent strokes.  Diabetes screening involves taking a blood sample to check your fasting blood sugar level. This should be done once every 3  years, after age 91, if you are within normal weight and without risk factors for diabetes. Testing should be considered at a younger age or be carried out more frequently if you are overweight and have at least 1 risk factor for diabetes.  Breast cancer screening is essential preventative care for women. You should practice "breast self-awareness." This means understanding the normal appearance and feel of your breasts and may include breast self-examination. Any changes detected, no matter how small, should be reported to a caregiver. Women in their 66s and 30s should have a clinical breast exam (CBE) by a caregiver as part of a regular health exam every 1 to 3 years. After age 101, women should have a CBE every year. Starting at age 100, women should consider having a mammogram (breast X-ray) every year. Women who have a family history of breast cancer should talk to their caregiver about genetic screening. Women at a high risk of breast cancer should talk to their caregiver about having an MRI and a mammogram every year.  Breast cancer gene (BRCA)-related cancer risk assessment is recommended for women who have family members with BRCA-related cancers. BRCA-related cancers include breast, ovarian, tubal, and peritoneal cancers. Having family members with these cancers may be associated with an increased risk for harmful changes (mutations) in the breast cancer genes BRCA1 and BRCA2. Results of the assessment will determine the need for genetic counseling and BRCA1 and BRCA2 testing.  The Pap test is a screening test for cervical cancer. Women should have a Pap test starting at age 57. Between ages 25 and 35, Pap tests should be repeated every 2 years. Beginning at age 37, you should have a Pap test every 3 years as long as the past 3 Pap tests have been normal. If you had a hysterectomy for a problem that was not cancer or a condition that could lead to cancer, then you no longer need Pap tests. If you are  between ages 50 and 76, and you have had normal Pap tests going back 10 years, you no longer need Pap tests. If you have had past treatment for cervical cancer or a condition that could lead to cancer, you need Pap tests and screening for cancer for at least 20 years after your treatment. If Pap tests have been discontinued, risk factors (such as a new sexual partner) need to be reassessed to determine if screening should be resumed. Some women have medical problems that increase the chance of getting cervical cancer. In these cases, your caregiver may recommend more frequent screening and Pap tests.  The human papillomavirus (HPV) test is an additional test that may be used for cervical cancer screening. The HPV test looks for the virus that can cause the cell changes on the cervix. The cells collected during the Pap test can be tested for HPV. The HPV test could be used to screen women aged 44 years and older, and should be used in women of any age  who have unclear Pap test results. After the age of 55, women should have HPV testing at the same frequency as a Pap test.  Colorectal cancer can be detected and often prevented. Most routine colorectal cancer screening begins at the age of 44 and continues through age 20. However, your caregiver may recommend screening at an earlier age if you have risk factors for colon cancer. On a yearly basis, your caregiver may provide home test kits to check for hidden blood in the stool. Use of a small camera at the end of a tube, to directly examine the colon (sigmoidoscopy or colonoscopy), can detect the earliest forms of colorectal cancer. Talk to your caregiver about this at age 86, when routine screening begins. Direct examination of the colon should be repeated every 5 to 10 years through age 13, unless early forms of pre-cancerous polyps or small growths are found.  Hepatitis C blood testing is recommended for all people born from [birth year] through 1966 and any  individual with known risks for hepatitis C.  Practice safe sex. Use condoms and avoid high-risk sexual practices to reduce the spread of sexually transmitted infections (STIs). Sexually active women aged 36 and younger should be checked for Chlamydia, which is a common sexually transmitted infection. Older women with new or multiple partners should also be tested for Chlamydia. Testing for other STIs is recommended if you are sexually active and at increased risk.  Osteoporosis is a disease in which the bones lose minerals and strength with aging. This can result in serious bone fractures. The risk of osteoporosis can be identified using a bone density scan. Women ages 20 and over and women at risk for fractures or osteoporosis should discuss screening with their caregivers. Ask your caregiver whether you should be taking a calcium supplement or vitamin D to reduce the rate of osteoporosis.  Menopause can be associated with physical symptoms and risks. Hormone replacement therapy is available to decrease symptoms and risks. You should talk to your caregiver about whether hormone replacement therapy is right for you.  Use sunscreen. Apply sunscreen liberally and repeatedly throughout the day. You should seek shade when your shadow is shorter than you. Protect yourself by wearing long sleeves, pants, a wide-brimmed hat, and sunglasses year round, whenever you are outdoors.  Notify your caregiver of new moles or changes in moles, especially if there is a change in shape or color. Also notify your caregiver if a mole is larger than the size of a pencil eraser.  Stay current with your immunizations. Document Released: 09/26/2010 Document Revised: 07/08/2012 Document Reviewed: 09/26/2010 [facility] Patient Information 2014 [employer].

## 2014-08-10 NOTE — Progress Notes (Signed)
Marie Peterson 07/04/1965 [id number]        49 y.o.  G2P2002 for breast and pelvic exam. Several issues noted below.  Past medical history,surgical history, problem list, medications, allergies, family history and social history were all reviewed and documented as reviewed in the EPIC chart.  ROS:  Performed with pertinent positives and negatives included in the history, assessment and plan.   Additional significant findings :  none   Exam: Kim Counsellor Vitals:   08/10/14 0819  BP: 122/76  Height: 5\' 4"  (1.626 m)  Weight: 146 lb (66.225 kg)   General appearance:  Normal affect, orientation and appearance. Skin: Grossly normal HEENT: Without gross lesions.  No cervical or supraclavicular adenopathy. Thyroid normal.  Lungs:  Clear without wheezing, rales or rhonchi Cardiac: RR, without RMG Abdominal:  Soft, nontender, without masses, guarding, rebound, organomegaly or hernia Breasts:  Examined lying and sitting without masses, retractions, discharge or axillary adenopathy. Pelvic:  Ext/BUS/vagina with generalized atrophic changes  Cervix flush with the upper vagina. Pap smear done.  Uterus anteverted, normal size, shape and contour, midline and mobile nontender   Adnexa  Without masses or tenderness    Anus and perineum  Normal   Rectovaginal  Normal sphincter tone without palpated masses or tenderness.    Assessment/Plan:  49 y.o. [id number] female for breast and pelvic exam.   1. Premature menopause. Following chemotherapy for lupus age 49. Transient trial of HRT but discontinued. Overall doing well without significant hot flushes or night sweats. No vaginal bleeding. Does have some dyspareunia for which she uses OTC lubricants. Is not interested in HRT as discussed in the past. Report any vaginal bleeding. 2. History of leiomyoma. Exam shows uterus to be normal size. Continue with annual exam monitoring. 3. Pap smear/HPV negative 2015. Pap smear done today. Will continue with  annual cytology given her immunosuppression. History of CIN-1 in 1990 with negative Pap smears since then. 4. Osteopenia.  DEXA 2015 T score -1.7. FRAX 1.6%/0.1%. Increased calcium vitamin D reviewed. Continues on prednisone. Recheck DEXA next year to year interval. 5. Detrusor instability. Continues on Oxytrol with good results. 6. Mammography 02/2014. Continue with annual mammography. SBE monthly reviewed. 7. Health maintenance. Continues follow up of her lupus with her other physicians. No blood work done as this is done through her other physician's offices. Follow up in one year, sooner as needed.     Anastasio Auerbach MD, 8:44 AM 08/10/2014

## 2014-08-10 NOTE — Addendum Note (Signed)
Addended by: Nelva Nay on: 08/10/2014 09:23 AM   Modules accepted: Orders

## 2014-08-11 LAB — CYTOLOGY - PAP

## 2014-09-21 DIAGNOSIS — M329 Systemic lupus erythematosus, unspecified: Secondary | ICD-10-CM | POA: Diagnosis not present

## 2014-10-08 DIAGNOSIS — R946 Abnormal results of thyroid function studies: Secondary | ICD-10-CM | POA: Diagnosis not present

## 2014-10-08 DIAGNOSIS — M329 Systemic lupus erythematosus, unspecified: Secondary | ICD-10-CM | POA: Diagnosis not present

## 2014-10-08 DIAGNOSIS — E559 Vitamin D deficiency, unspecified: Secondary | ICD-10-CM | POA: Diagnosis not present

## 2014-10-08 DIAGNOSIS — I10 Essential (primary) hypertension: Secondary | ICD-10-CM | POA: Diagnosis not present

## 2014-10-13 ENCOUNTER — Encounter: Payer: Self-pay | Admitting: Cardiovascular Disease

## 2014-10-15 ENCOUNTER — Other Ambulatory Visit: Payer: Self-pay | Admitting: Neurology

## 2014-10-16 ENCOUNTER — Other Ambulatory Visit: Payer: Self-pay

## 2014-10-16 MED ORDER — PREGABALIN 75 MG PO CAPS: 150.0000 mg | ORAL_CAPSULE | Freq: Two times a day (BID) | ORAL | Status: DC

## 2014-10-16 NOTE — Telephone Encounter (Signed)
Dr Jaynee Eagles is out of the office.  Forwarding request to [facility] for review.

## 2014-10-20 ENCOUNTER — Other Ambulatory Visit: Payer: Self-pay

## 2014-10-20 MED ORDER — PREGABALIN 75 MG PO CAPS: 150.0000 mg | ORAL_CAPSULE | Freq: Two times a day (BID) | ORAL | Status: DC

## 2014-10-20 NOTE — Telephone Encounter (Signed)
Rx signed and faxed.

## 2014-10-21 ENCOUNTER — Encounter: Payer: Self-pay | Admitting: Cardiovascular Disease

## 2014-10-21 ENCOUNTER — Ambulatory Visit (INDEPENDENT_AMBULATORY_CARE_PROVIDER_SITE_OTHER): Payer: 59 | Admitting: Cardiovascular Disease

## 2014-10-21 VITALS — BP 146/92 | HR 53 | Ht 64.0 in | Wt 152.0 lb

## 2014-10-21 DIAGNOSIS — R079 Chest pain, unspecified: Secondary | ICD-10-CM

## 2014-10-21 DIAGNOSIS — I10 Essential (primary) hypertension: Secondary | ICD-10-CM | POA: Diagnosis not present

## 2014-10-21 DIAGNOSIS — R0789 Other chest pain: Secondary | ICD-10-CM | POA: Insufficient documentation

## 2014-10-21 NOTE — Assessment & Plan Note (Signed)
Marie Peterson  had 2 episodes of atypical chest pain beginning substernal radiating to her right upper quadrant. She does still have a gallbladder. The pain was not associated with any other symptoms. She does have risk factors including 2 hypertension and family history as well as SLE. I'm going to get a routine GXT.

## 2014-10-21 NOTE — Patient Instructions (Signed)
  We will see you back in follow up in 6 months with Dr Gwenlyn Found.   Dr Gwenlyn Found has ordered: 1.  exercise tolerance test. For further information please visit [email or web address]. Please also follow instruction sheet, as given.

## 2014-10-21 NOTE — Assessment & Plan Note (Signed)
History of hypertension blood pressure measured 146/92. She is on Lotensin and amlodipine. Continue current meds at current dosing

## 2014-10-21 NOTE — Progress Notes (Signed)
10/21/2014 Thayer Ohm   [birth year]  [id number]  Primary Physician [name], MD Primary Cardiologist: Lorretta Harp MD Renae Gloss   HPI: Marie Peterson is a 49 year old mildly overweight married an American female mother of 2 daughters one of them goes to the [employer]. And the other goes to [employer] [employer], who is formally a patient of Dr. Lowella Fairy. She currently is out on disability because of SLE. Her factors include treated hypertension. Her father had bypass surgery at age 47. She's never had a heart attack or stroke. She denies chest pain or shortness of breath. She does have GERD. Since I saw her in the office one year ago she has remained asymptomatic. Her primary care physician, Dr. Maudie Mercury, was concerned that she was relatively bradycardic but this is not a new finding and she has a symptomatically from this. She has had 2 episodes of atypical chest pain and over the last several weeks.   Current Outpatient Prescriptions  Medication Sig Dispense Refill  . amLODipine (NORVASC) 5 MG tablet Take 5 mg by mouth daily.    [name] Kitchen aspirin 81 MG tablet Take 81 mg by mouth daily.    . benazepril (LOTENSIN) 40 MG tablet Take 1 tablet by mouth daily.  1  . Cholecalciferol (VITAMIN D PO) Take 1,000 Int'l Units by mouth daily.     . diclofenac sodium (VOLTAREN) 1 % GEL Apply topically.    [name] Kitchen FERREX 150 150 MG capsule Take 1 tablet by mouth daily.  1  . furosemide (LASIX) 40 MG tablet Take 1 tablet by mouth 2 (two) times daily.  0  . HYDROcodone-acetaminophen (NORCO) 10-325 MG per tablet Take 1 tablet by mouth every 6 (six) hours as needed for pain. 1-3 Tabs per day as needed    . hydroxychloroquine (PLAQUENIL) 200 MG tablet Take 2 tablets by mouth daily.  0  . L-Methylfolate-B6-B12 (METANX PO) Take 1 tablet by mouth daily.    [name] Kitchen lidocaine (LIDODERM) 5 % Place 3 patches onto the skin daily. Remove & Discard patch within 12 hours or as directed by MD 90 patch 6  . magnesium oxide (MAG-OX) 400 (241.3  MG) MG tablet Take 1 tablet (400 mg total) by mouth daily. 30 tablet 5  . Multiple Vitamin (MULTIVITAMIN) tablet Take 1 tablet by mouth daily.    . nabumetone (RELAFEN) 500 MG tablet Take 500 mg by mouth 2 (two) times daily as needed.  0  . oxybutynin (DITROPAN-XL) 10 MG 24 hr tablet Take 1 tablet by mouth daily.  0  . pantoprazole (PROTONIX) 40 MG tablet Take 1 tablet (40 mg total) by mouth daily. 30 tablet 10  . potassium chloride SA (K-DUR,KLOR-CON) 20 MEQ tablet take 1 tablet by mouth once daily 30 tablet 11  . predniSONE (DELTASONE) 1 MG tablet Take 1 tablet by mouth daily. Takes with Prednisone 5 mg.  0  . predniSONE (DELTASONE) 5 MG tablet Take 5 mg by mouth daily.    . pregabalin (LYRICA) 75 MG capsule Take 2 capsules (150 mg total) by mouth 2 (two) times daily. 120 capsule 1  . SUMAtriptan (IMITREX) 100 MG tablet Take one tab at onset of headache. May repeat in 2 hours if headache persists or recurs. 10 tablet 6  . temazepam (RESTORIL) 15 MG capsule Take 15 mg by mouth daily.  0  . topiramate (TOPAMAX) 50 MG tablet Take 1 tablet by mouth daily.  0  . zolpidem (AMBIEN) 10 MG tablet Take 1 tablet  by mouth at bedtime as needed.  0   No current facility-administered medications for this visit.    Allergies  Allergen Reactions  . Myrbetriq [Mirabegron] Itching    History   Social History  . Marital Status: Married    Spouse Name: [name]  . Number of Children: 2  . Years of Education: College   Occupational History  .      disability   Social History Main Topics  . Smoking status: Never Smoker   . Smokeless tobacco: Never Used  . Alcohol Use: No  . Drug Use: No  . Sexual Activity: Not Currently    Birth Control/ Protection: Surgical, Post-menopausal     Comment: Tubal lig-1st intercourse 49 yo-Fewer than 5 partners   Other Topics Concern  . Not on file   Social History Narrative   Patient lives at home with her family.   Caffeine Use: 2 cups of coffee daily.       Review of Systems: General: negative for chills, fever, night sweats or weight changes.  Cardiovascular: negative for chest pain, dyspnea on exertion, edema, orthopnea, palpitations, paroxysmal nocturnal dyspnea or shortness of breath Dermatological: negative for rash Respiratory: negative for cough or wheezing Urologic: negative for hematuria Abdominal: negative for nausea, vomiting, diarrhea, bright red blood per rectum, melena, or hematemesis Neurologic: negative for visual changes, syncope, or dizziness All other systems reviewed and are otherwise negative except as noted above.    Blood pressure 146/92, pulse 53, height 5\' 4"  (1.626 m), weight 152 lb (68.947 kg).  General appearance: alert and no distress Neck: no adenopathy, no carotid bruit, no JVD, supple, symmetrical, trachea midline and thyroid not enlarged, symmetric, no tenderness/mass/nodules Lungs: clear to auscultation bilaterally Heart: regular rate and rhythm, S1, S2 normal, no murmur, click, rub or gallop Extremities: extremities normal, atraumatic, no cyanosis or edema  EKG sinus bradycardia 53 without ST or T-wave changes. I personally reviewed this EKG  ASSESSMENT AND PLAN:   Essential hypertension History of hypertension blood pressure measured 146/92. She is on Lotensin and amlodipine. Continue current meds at current dosing  Atypical chest pain Mrs.Sholl  had 2 episodes of atypical chest pain beginning substernal radiating to her right upper quadrant. She does still have a gallbladder. The pain was not associated with any other symptoms. She does have risk factors including 2 hypertension and family history as well as SLE. I'm going to get a routine GXT.      Lorretta Harp MD FACP,FACC,FAHA, [facility] 10/21/2014 4:06 PM

## 2014-10-22 ENCOUNTER — Other Ambulatory Visit: Payer: Self-pay | Admitting: Neurology

## 2014-11-02 ENCOUNTER — Encounter: Payer: Self-pay | Admitting: Neurology

## 2014-11-02 ENCOUNTER — Ambulatory Visit (INDEPENDENT_AMBULATORY_CARE_PROVIDER_SITE_OTHER): Payer: 59 | Admitting: Neurology

## 2014-11-02 VITALS — BP 115/66 | HR 49 | Temp 97.1°F | Ht 64.0 in | Wt 150.5 lb

## 2014-11-02 DIAGNOSIS — G43109 Migraine with aura, not intractable, without status migrainosus: Secondary | ICD-10-CM | POA: Diagnosis not present

## 2014-11-02 DIAGNOSIS — B0229 Other postherpetic nervous system involvement: Secondary | ICD-10-CM

## 2014-11-02 DIAGNOSIS — E538 Deficiency of other specified B group vitamins: Secondary | ICD-10-CM

## 2014-11-02 DIAGNOSIS — G609 Hereditary and idiopathic neuropathy, unspecified: Secondary | ICD-10-CM

## 2014-11-02 MED ORDER — TOPIRAMATE 50 MG PO TABS: 50.0000 mg | ORAL_TABLET | Freq: Two times a day (BID) | ORAL | Status: DC

## 2014-11-02 MED ORDER — PREGABALIN 150 MG PO CAPS: 150.0000 mg | ORAL_CAPSULE | Freq: Two times a day (BID) | ORAL | Status: DC

## 2014-11-02 MED ORDER — SUMATRIPTAN SUCCINATE 100 MG PO TABS: ORAL_TABLET | ORAL | Status: DC

## 2014-11-02 NOTE — Patient Instructions (Signed)
Overall you are doing fairly well but I do want to suggest a few things today:   Remember to drink plenty of fluid, eat healthy meals and do not skip any meals. Try to eat protein with a every meal and eat a healthy snack such as fruit or nuts in between meals. Try to keep a regular sleep-wake schedule and try to exercise daily, particularly in the form of walking, 20-30 minutes a day, if you can.   As far as your medications are concerned, I would like to suggest: continue current medications  As far as diagnostic testing: lab  I would like to see you back in 1 year, sooner if we need to. Please call us with any interim questions, concerns, problems, updates or refill requests.   Please also call us for any test results so we can go over those with you on the phone.  My clinical assistant and will answer any of your questions and relay your messages to me and also relay most of my messages to you.   Our phone number is [phone]. We also have an after hours call service for urgent matters and there is a physician on-call for urgent questions. For any emergencies you know to call 911 or go to the nearest emergency room

## 2014-11-02 NOTE — Progress Notes (Addendum)
[facility]    Provider:  Dr Jaynee Eagles Referring Provider: [name], MD Primary Care Physician:  [name], MD  CC: Small-fiber neuropathy  Interval hsitory 11/02/2014; She is on the Topamax. She gets migraines twice a month. Migraines are more on the right. She has nausea, light sensitivity. No aura. They can last all day. She is in bed most of the day. She takes the imitrex at the onset and it helps. No side effects from the topamax and imitrex. She uses the Lidoderm patches for her post herpetic neuralgia but it was $75. It gets worse in the winter time, now it is ok. Pain is better in the summer. Lyrica helped a lot. She is having pains in her feet at the bottom and it wakes her up. She has sharp pains in the heels. The pain is when she walks on her heel. And is tender at night in the heel with sharp shooting pains in the heel. Pain right > left heel.    HPI: Marie Peterson is a 49 y.o. female here as a follow up for small-fiber neuropathy. Previously evaluated and seen by Dr. Leta Baptist and is transitioning to me. She is still on the neurontin and topical cream. She still gets the burning in her feet which is worsening. She had shingles and she still has pain in the right side of her torso. Now she also has burning and weakness in her hands too. She has migraines and Dr. Maudie Mercury started her on Topamax 72m twice a day and it is helping with the migraines.   Review of Systems: Patient complains of symptoms per HPI as well as the following symptoms: burning, headaches, Fatigue, insomnia, aching muscles, joint pain, muscle cramps, walking difficulty, numbness, diarrheano SOB, no CP. Pertinent negatives per HPI. All others negative.  Review of previous records: MRI of the brain unremarkable, copper, ceruloplasmin, IFE, lyme, esr, crp, b6, ace, hep c, hepb, hiv, b1,   HISTORY OF PRESENT ILLNESS(Dr. Penumalli):   UPDATE 05/01/13: Since last visit, symptoms are stable. Pain is  slightly better than last visit. Using topical neuropathy cream BID. Still on gabapentin 8061mTID. Overall satisfied with symptom control.  UPDATE 10/22/12: Since last visit, using compounded neuropathy cream on right torso (BID -TID) and it helps, better than lidoderm. Using gabapentin 60050mID. Feet still burning.  UPDATE 04/19/12: Since last visit, no new events. EMG, MRI, labs all neg. Tolerating gabapentin and lidoderm patch for post-herpetic neuralgia on right torso. Sxs in hands and feet unchanged.  PRIOR HPI: [age]ar old right-handed female with history of systemic lupus with glomerulonephritis, osteoarthritis, shingles, here for evaluation of diffuse numbness and pain in arms and legs since March 2012.  Patient reports diffuse numbness, pain, burning sensation, and her bilateral arms and bilateral legs. Her left leg is more affected than the right. Sitting in the car or walking tend to aggravate the symptoms in her legs. She has no symptoms above her neck. She denies significant neck pain.  She has been on gabapentin since 2010 when she was diagnosed with shingles affecting her right torso. Her gabapentin, hydrocodone, etodoloc have not been increased since her numbness and pain have developed in March 2012. She had an outside NCS (BUE only; without EMG) which was normal.  Review of Systems: Patient complains of symptoms per HPI as well as the following symptoms: fatigue, leg sweeling, chest tightness, unexpected weight change. Pertinent negatives per HPI. All others negative.   History   Social History  .  Marital Status: Married    Spouse Name: [name]  . Number of Children: 2  . Years of Education: College   Occupational History  .      disability   Social History Main Topics  . Smoking status: Never Smoker   . Smokeless tobacco: Never Used  . Alcohol Use: No  . Drug Use: No  . Sexual Activity: Not Currently    Birth Control/ Protection: Surgical, Post-menopausal      Comment: Tubal lig-1st intercourse 49 yo-Fewer than 5 partners   Other Topics Concern  . Not on file   Social History Narrative   Patient lives at home with her family.   Caffeine Use: 2 cups of coffee daily.     Family History  Problem Relation Age of Onset  . Hypertension Mother   . Asthma Mother   . Diabetes Father   . Hypertension Father   . Heart disease Father     Past Medical History  Diagnosis Date  . CIN I (cervical intraepithelial neoplasia I) 1990  . Atrophic vaginitis   . Migraines   . Lupus nephritis   . Fibroid   . Arthritis   . Premature menopause age 49    following chemotherapy for Lupus  . Hypertension   . Osteopenia 07/2013    T score -1.7 FRAX 1.6%/0.1%  . Atypical chest pain     thought to be GERD; myoview 05/17/07-no significant ischemia; echo 07/20/11- normal EF=>55%  . Baker's cyst 03/28/12    venous doppler 03/28/12=no thrombus; baker's cyst at left popliteal fossa  . Chest pain     Past Surgical History  Procedure Laterality Date  . Cesarean section    . Myomectomy      Hysteroscopic myomectomy  . Hysteroscopy      Myomectomy  . Tubal ligation    . Colposcopy      Current Outpatient Prescriptions  Medication Sig Dispense Refill  . amLODipine (NORVASC) 5 MG tablet Take 5 mg by mouth daily.    [name] Kitchen aspirin 81 MG tablet Take 81 mg by mouth daily.    . benazepril (LOTENSIN) 40 MG tablet Take 1 tablet by mouth daily.  1  . Cholecalciferol (VITAMIN D PO) Take 1,000 Int'l Units by mouth daily.     . diclofenac sodium (VOLTAREN) 1 % GEL Apply topically.    [name] Kitchen FERREX 150 150 MG capsule Take 1 tablet by mouth daily.  1  . furosemide (LASIX) 40 MG tablet Take 1 tablet by mouth 2 (two) times daily.  0  . HYDROcodone-acetaminophen (NORCO) 10-325 MG per tablet Take 1 tablet by mouth every 6 (six) hours as needed for pain. 1-3 Tabs per day as needed    . hydroxychloroquine (PLAQUENIL) 200 MG tablet Take 2 tablets by mouth daily.  0  . L-Methylfolate-B6-B12  (METANX PO) Take 1 tablet by mouth daily.    [name] Kitchen lidocaine (LIDODERM) 5 % Place 3 patches onto the skin daily. Remove & Discard patch within 12 hours or as directed by MD 90 patch 6  . magnesium oxide (MAG-OX) 400 (241.3 MG) MG tablet Take 1 tablet (400 mg total) by mouth daily. 30 tablet 5  . Multiple Vitamin (MULTIVITAMIN) tablet Take 1 tablet by mouth daily.    . nabumetone (RELAFEN) 500 MG tablet Take 500 mg by mouth 2 (two) times daily as needed.  0  . oxybutynin (DITROPAN-XL) 10 MG 24 hr tablet Take 1 tablet by mouth daily.  0  . pantoprazole (PROTONIX) 40  MG tablet Take 1 tablet (40 mg total) by mouth daily. 30 tablet 10  . potassium chloride SA (K-DUR,KLOR-CON) 20 MEQ tablet take 1 tablet by mouth once daily 30 tablet 11  . predniSONE (DELTASONE) 1 MG tablet Take 1 tablet by mouth daily. Takes with Prednisone 5 mg.  0  . predniSONE (DELTASONE) 5 MG tablet Take 5 mg by mouth daily.    . pregabalin (LYRICA) 75 MG capsule Take 2 capsules (150 mg total) by mouth 2 (two) times daily. 120 capsule 1  . SUMAtriptan (IMITREX) 100 MG tablet Take one tab at onset of headache. May repeat in 2 hours if headache persists or recurs. 10 tablet 6  . temazepam (RESTORIL) 15 MG capsule Take 15 mg by mouth daily.  0  . topiramate (TOPAMAX) 50 MG tablet Take 1 tablet by mouth daily.  0  . zolpidem (AMBIEN) 10 MG tablet Take 1 tablet by mouth at bedtime as needed.  0   No current facility-administered medications for this visit.    Allergies as of 11/02/2014 - Review Complete 11/02/2014  Allergen Reaction Noted  . Myrbetriq [mirabegron] Itching 10/21/2014    Vitals: BP 115/66 mmHg  Pulse 49  Temp(Src) 97.1 F (36.2 C) (Oral)  Ht 5' 4" (1.626 m)  Wt 150 lb 8 oz (68.266 kg)  BMI 25.82 kg/m2 Last Weight:  Wt Readings from Last 1 Encounters:  11/02/14 150 lb 8 oz (68.266 kg)   Last Height:   Ht Readings from Last 1 Encounters:  11/02/14 5' 4" (1.626 m)    Neuro: Detailed Neurologic  Exam  Speech:  Speech is normal; fluent and spontaneous with normal comprehension.  Cognition:  The patient is oriented to person, place, and time;   recent and remote memory intact;   language fluent;   normal attention, concentration,   fund of knowledge Cranial Nerves:  The pupils are equal, round, and reactive to light.  Visual fields are full to finger confrontation. Extraocular movements are intact. Trigeminal sensation is intact and the muscles of mastication are normal. The face is symmetric. The palate elevates in the midline. Hearing intact. Voice is normal. Shoulder shrug is normal. The tongue has normal motion without fasciculations.    Motor Observation:  No asymmetry, no atrophy, and no involuntary movements noted. Tone:  Normal muscle tone.   Posture:  Posture is normal.   Strength:  Strength is V/V in the upper and lower limbs.    Sensation: decreased pp and temp in a stocking distribution    Clonus:  Clonus is absent.    Assessment/Plan:  49 year old female here for follow up of small-fiber neuropathy. Her symptoms are improved on Lyrica. She also has post-herpetic neuragia. Continue lidoderm patch for her torso. Continue Lyrica. Should check B12 and folate. Cont topamax and imitrex for her migraines.  Addendum recent labs 06/09/2014 included BUNs 14 creatinine 0.92 EGFR 85 mL/m. She has a history of lupus nephritis. 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 [place], [place] [address]  Phone [phone] Fax [phone]  A total of 15 minutes was spent face-to-face  with this patient. Over half this time was spent on counseling patient on the migraine and small-fiber neuropathy diagnosis and different  diagnostic and therapeutic options available.

## 2014-11-04 LAB — B12 AND FOLATE PANEL
Folate: >20 ng/mL (ref >3.0)
Vitamin B-12: >2000 pg/mL — ABNORMAL HIGH (ref 211–946)

## 2014-11-04 LAB — METHYLMALONIC ACID, SERUM: Methylmalonic Acid: 195 nmol/L (ref 0–378)

## 2014-11-05 ENCOUNTER — Telehealth: Payer: Self-pay | Admitting: *Deleted

## 2014-11-05 NOTE — Telephone Encounter (Signed)
Patient called. I relayed nl labs.

## 2014-11-05 NOTE — Telephone Encounter (Signed)
Left VM for pt to call back. I was calling about normal lab results. Gave [facility] phone number and office hours.

## 2014-11-05 NOTE — Telephone Encounter (Signed)
-----   Message from Melvenia Beam, MD sent at 11/04/2014  4:56 PM EDT ----- Let patient kmow labs were normal thanks

## 2014-11-10 ENCOUNTER — Telehealth (HOSPITAL_COMMUNITY): Payer: Self-pay

## 2014-11-10 NOTE — Telephone Encounter (Signed)
Encounter complete. 

## 2014-11-12 ENCOUNTER — Ambulatory Visit (HOSPITAL_COMMUNITY)
Admission: RE | Admit: 2014-11-12 | Discharge: 2014-11-12 | Disposition: A | Discharge status: Home / Self Care | Payer: 59 | Source: Ambulatory Visit | Attending: Cardiology | Admitting: Cardiology

## 2014-11-12 DIAGNOSIS — R079 Chest pain, unspecified: Secondary | ICD-10-CM | POA: Insufficient documentation

## 2014-11-12 LAB — EXERCISE TOLERANCE TEST
Estimated workload: 15.3 METS
Exercise duration (min): 13 min
MPHR: 170 {beats}/min
Peak HR: 153 {beats}/min
Percent HR: 90 %
RPE: 17
Rest HR: 56 {beats}/min

## 2014-12-18 ENCOUNTER — Other Ambulatory Visit: Payer: Self-pay | Admitting: Cardiovascular Disease

## 2014-12-18 NOTE — Telephone Encounter (Signed)
REFILL 

## 2015-01-20 ENCOUNTER — Other Ambulatory Visit: Payer: Self-pay | Admitting: *Deleted

## 2015-01-20 MED ORDER — PANTOPRAZOLE SODIUM 40 MG PO TBEC: 40.0000 mg | DELAYED_RELEASE_TABLET | Freq: Every day | ORAL | Status: DC

## 2015-02-04 DIAGNOSIS — Z79899 Other long term (current) drug therapy: Secondary | ICD-10-CM | POA: Diagnosis not present

## 2015-02-04 DIAGNOSIS — M3210 Systemic lupus erythematosus, organ or system involvement unspecified: Secondary | ICD-10-CM | POA: Diagnosis not present

## 2015-02-04 DIAGNOSIS — I10 Essential (primary) hypertension: Secondary | ICD-10-CM | POA: Diagnosis not present

## 2015-02-16 DIAGNOSIS — I10 Essential (primary) hypertension: Secondary | ICD-10-CM | POA: Diagnosis not present

## 2015-02-16 DIAGNOSIS — M329 Systemic lupus erythematosus, unspecified: Secondary | ICD-10-CM | POA: Diagnosis not present

## 2015-02-16 DIAGNOSIS — Z23 Encounter for immunization: Secondary | ICD-10-CM | POA: Diagnosis not present

## 2015-02-16 DIAGNOSIS — E559 Vitamin D deficiency, unspecified: Secondary | ICD-10-CM | POA: Diagnosis not present

## 2015-03-02 ENCOUNTER — Other Ambulatory Visit: Payer: Self-pay

## 2015-03-02 DIAGNOSIS — Z1231 Encounter for screening mammogram for malignant neoplasm of breast: Secondary | ICD-10-CM

## 2015-04-01 ENCOUNTER — Ambulatory Visit
Admission: RE | Admit: 2015-04-01 | Discharge: 2015-04-01 | Disposition: A | Discharge status: Home / Self Care | Payer: 59 | Source: Ambulatory Visit

## 2015-04-01 DIAGNOSIS — Z1231 Encounter for screening mammogram for malignant neoplasm of breast: Secondary | ICD-10-CM

## 2015-04-05 ENCOUNTER — Other Ambulatory Visit: Payer: Self-pay | Admitting: Cardiovascular Disease

## 2015-04-05 NOTE — Telephone Encounter (Signed)
Rx(s) sent to pharmacy electronically.  

## 2015-05-08 ENCOUNTER — Other Ambulatory Visit: Payer: Self-pay | Admitting: Neurology

## 2015-06-10 DIAGNOSIS — G629 Polyneuropathy, unspecified: Secondary | ICD-10-CM | POA: Diagnosis not present

## 2015-06-10 DIAGNOSIS — Z87448 Personal history of other diseases of urinary system: Secondary | ICD-10-CM | POA: Diagnosis not present

## 2015-06-10 DIAGNOSIS — I10 Essential (primary) hypertension: Secondary | ICD-10-CM | POA: Diagnosis not present

## 2015-06-10 DIAGNOSIS — M329 Systemic lupus erythematosus, unspecified: Secondary | ICD-10-CM | POA: Diagnosis not present

## 2015-08-12 DIAGNOSIS — I10 Essential (primary) hypertension: Secondary | ICD-10-CM | POA: Diagnosis not present

## 2015-08-12 DIAGNOSIS — Z0000 Encounter for general adult medical examination without abnormal findings: Secondary | ICD-10-CM | POA: Diagnosis not present

## 2015-08-12 DIAGNOSIS — E559 Vitamin D deficiency, unspecified: Secondary | ICD-10-CM | POA: Diagnosis not present

## 2015-08-17 DIAGNOSIS — R0600 Dyspnea, unspecified: Secondary | ICD-10-CM | POA: Diagnosis not present

## 2015-08-17 DIAGNOSIS — I10 Essential (primary) hypertension: Secondary | ICD-10-CM | POA: Diagnosis not present

## 2015-08-17 DIAGNOSIS — M329 Systemic lupus erythematosus, unspecified: Secondary | ICD-10-CM | POA: Diagnosis not present

## 2015-08-17 DIAGNOSIS — M79671 Pain in right foot: Secondary | ICD-10-CM | POA: Diagnosis not present

## 2015-08-26 ENCOUNTER — Ambulatory Visit (INDEPENDENT_AMBULATORY_CARE_PROVIDER_SITE_OTHER): Payer: 59 | Admitting: Gynecology

## 2015-08-26 ENCOUNTER — Encounter: Payer: Self-pay | Admitting: Gynecology

## 2015-08-26 VITALS — BP 114/70 | Ht 64.0 in | Wt 150.0 lb

## 2015-08-26 DIAGNOSIS — M8580 Other specified disorders of bone density and structure, unspecified site: Secondary | ICD-10-CM

## 2015-08-26 DIAGNOSIS — Z01419 Encounter for gynecological examination (general) (routine) without abnormal findings: Secondary | ICD-10-CM

## 2015-08-26 DIAGNOSIS — R6882 Decreased libido: Secondary | ICD-10-CM | POA: Diagnosis not present

## 2015-08-26 DIAGNOSIS — B029 Zoster without complications: Secondary | ICD-10-CM | POA: Insufficient documentation

## 2015-08-26 DIAGNOSIS — N952 Postmenopausal atrophic vaginitis: Secondary | ICD-10-CM

## 2015-08-26 DIAGNOSIS — Z124 Encounter for screening for malignant neoplasm of cervix: Secondary | ICD-10-CM | POA: Diagnosis not present

## 2015-08-26 HISTORY — DX: Other specified disorders of bone density and structure, unspecified site: M85.80

## 2015-08-26 NOTE — Addendum Note (Signed)
Addended by: Nelva Nay on: 08/26/2015 10:03 AM   Modules accepted: Orders, SmartSet

## 2015-08-26 NOTE — Progress Notes (Signed)
    Marie Peterson 1965/07/04 [id number]        50 y.o.  [id number]  for Breast and pelvic exam. Several issues noted below  Past medical history,surgical history, problem list, medications, allergies, family history and social history were all reviewed and documented as reviewed in the EPIC chart.  ROS:  Performed with pertinent positives and negatives included in the history, assessment and plan.   Additional significant findings :  Decreased libido as discussed below   Exam: Gilman Schmidt assistant Filed Vitals:   08/26/15 0916  BP: 114/70  Height: 5\' 4"  (1.626 m)  Weight: 150 lb (68.04 kg)   General appearance:  Normal affect, orientation and appearance. Skin: Grossly normal HEENT: Without gross lesions.  No cervical or supraclavicular adenopathy. Thyroid normal.  Lungs:  Clear without wheezing, rales or rhonchi Cardiac: RR, without RMG Abdominal:  Soft, nontender, without masses, guarding, rebound, organomegaly or hernia Breasts:  Examined lying and sitting without masses, retractions, discharge or axillary adenopathy. Pelvic:  Ext/BUS/vagina with atrophic changes  Cervix flush with upper vagina. Pap smear done  Uterus small difficult to palpate. No masses or tenderness  Adnexa without masses or tenderness    Anus and perineum normal   Rectovaginal normal sphincter tone without palpated masses or tenderness.    Assessment/Plan:  50 y.o. [id number] female for breast and pelvic exam.   1. Postmenopausal/atrophic genital changes. Without significant hot flushes, night sweats, vaginal dryness or any vaginal bleeding. Continue to monitor and report any issues or bleeding. 2. Decreased libido. Patient notes a lack of desire for intercourse. I reviewed the many reasons with this and options for treatment to include androgen as well as Addyi.  At this point the patient wants no intervention. 3. Pap smear 2016. Pap smear done today. We'll continue with annual cytology given her  immunosuppression. History of CIN-1 in 1990 negative Pap smear since. 4. History of leiomyoma. Uterus small difficult to palpate. Continue with annual exam monitoring. 5. Osteopenia. DEXA  2015 T score -1.7 FRAX 1.6%/0.1%. Continues on prednisone. Will check DEXA now at 2 year interval. Patient will schedule in follow up for this.  6. Mammography 03/2015. Continue with annual mammography when due. SBE monthly reviewed. 7. Colonoscopy never. Plans to schedule as she has turned 49. 8. Health maintenance. No routine lab work done as patient does this at her other physician's offices. Follow up for DEXA otherwise 1 year, sooner as needed.   Anastasio Auerbach MD, 9:49 AM 08/26/2015

## 2015-08-26 NOTE — Patient Instructions (Signed)
Follow up for bone density as scheduled.  You may obtain a copy of any labs that were done today by logging onto MyChart as outlined in the instructions provided with your AVS (after visit summary). The office will not call with normal lab results but certainly if there are any significant abnormalities then we will contact you.   Health Maintenance Adopting a healthy lifestyle and getting preventive care can go a long way to promote health and wellness. Talk with your health care provider about what schedule of regular examinations is right for you. This is a good chance for you to check in with your provider about disease prevention and staying healthy. In between checkups, there are plenty of things you can do on your own. Experts have done a lot of research about which lifestyle changes and preventive measures are most likely to keep you healthy. Ask your health care provider for more information. WEIGHT AND DIET  Eat a healthy diet  Be sure to include plenty of vegetables, fruits, low-fat dairy products, and lean protein.  Do not eat a lot of foods high in solid fats, added sugars, or salt.  Get regular exercise. This is one of the most important things you can do for your health.  Most adults should exercise for at least 150 minutes each week. The exercise should increase your heart rate and make you sweat (moderate-intensity exercise).  Most adults should also do strengthening exercises at least twice a week. This is in addition to the moderate-intensity exercise.  Maintain a healthy weight  Body mass index (BMI) is a measurement that can be used to identify possible weight problems. It estimates body fat based on height and weight. Your health care provider can help determine your BMI and help you achieve or maintain a healthy weight.  For females 20 years of age and older:   A BMI below 18.5 is considered underweight.  A BMI of 18.5 to 24.9 is normal.  A BMI of 25 to 29.9 is  considered overweight.  A BMI of 30 and above is considered obese.  Watch levels of cholesterol and blood lipids  You should start having your blood tested for lipids and cholesterol at 51 years of age, then have this test every 5 years.  You may need to have your cholesterol levels checked more often if:  Your lipid or cholesterol levels are high.  You are older than 50 years of age.  You are at high risk for heart disease.  CANCER SCREENING   Lung Cancer  Lung cancer screening is recommended for adults 55-80 years old who are at high risk for lung cancer because of a history of smoking.  A yearly low-dose CT scan of the lungs is recommended for people who:  Currently smoke.  Have quit within the past 15 years.  Have at least a 30-pack-year history of smoking. A pack year is smoking an average of one pack of cigarettes a day for 1 year.  Yearly screening should continue until it has been 15 years since you quit.  Yearly screening should stop if you develop a health problem that would prevent you from having lung cancer treatment.  Breast Cancer  Practice breast self-awareness. This means understanding how your breasts normally appear and feel.  It also means doing regular breast self-exams. Let your health care provider know about any changes, no matter how small.  If you are in your 20s or 30s, you should have a clinical breast exam (  by a health care provider every 1-3 years as part of a regular health exam.  If you are 22 or older, have a CBE every year. Also consider having a breast X-ray (mammogram) every year.  If you have a family history of breast cancer, talk to your health care provider about genetic screening.  If you are at high risk for breast cancer, talk to your health care provider about having an MRI and a mammogram every year.  Breast cancer gene (BRCA) assessment is recommended for women who have family members with BRCA-related cancers.  BRCA-related cancers include:  Breast.  Ovarian.  Tubal.  Peritoneal cancers.  Results of the assessment will determine the need for genetic counseling and BRCA1 and BRCA2 testing. Cervical Cancer Routine pelvic examinations to screen for cervical cancer are no longer recommended for nonpregnant women who are considered low risk for cancer of the pelvic organs (ovaries, uterus, and vagina) and who do not have symptoms. A pelvic examination may be necessary if you have symptoms including those associated with pelvic infections. Ask your health care provider if a screening pelvic exam is right for you.   The Pap test is the screening test for cervical cancer for women who are considered at risk.  If you had a hysterectomy for a problem that was not cancer or a condition that could lead to cancer, then you no longer need Pap tests.  If you are older than 65 years, and you have had normal Pap tests for the past 10 years, you no longer need to have Pap tests.  If you have had past treatment for cervical cancer or a condition that could lead to cancer, you need Pap tests and screening for cancer for at least 20 years after your treatment.  If you no longer get a Pap test, assess your risk factors if they change (such as having a new sexual partner). This can affect whether you should start being screened again.  Some women have medical problems that increase their chance of getting cervical cancer. If this is the case for you, your health care provider may recommend more frequent screening and Pap tests.  The human papillomavirus (HPV) test is another test that may be used for cervical cancer screening. The HPV test looks for the virus that can cause cell changes in the cervix. The cells collected during the Pap test can be tested for HPV.  The HPV test can be used to screen women 39 years of age and older. Getting tested for HPV can extend the interval between normal Pap tests from three to  five years.  An HPV test also should be used to screen women of any age who have unclear Pap test results.  After 51 years of age, women should have HPV testing as often as Pap tests.  Colorectal Cancer  This type of cancer can be detected and often prevented.  Routine colorectal cancer screening usually begins at 51 years of age and continues through 51 years of age.  Your health care provider may recommend screening at an earlier age if you have risk factors for colon cancer.  Your health care provider may also recommend using home test kits to check for hidden blood in the stool.  A small camera at the end of a tube can be used to examine your colon directly (sigmoidoscopy or colonoscopy). This is done to check for the earliest forms of colorectal cancer.  Routine screening usually begins at age 58.  Direct  examination of the colon should be repeated every 5-10 years through 51 years of age. However, you may need to be screened more often if early forms of precancerous polyps or small growths are found. Skin Cancer  Check your skin from head to toe regularly.  Tell your health care provider about any new moles or changes in moles, especially if there is a change in a mole's shape or color.  Also tell your health care provider if you have a mole that is larger than the size of a pencil eraser.  Always use sunscreen. Apply sunscreen liberally and repeatedly throughout the day.  Protect yourself by wearing long sleeves, pants, a wide-brimmed hat, and sunglasses whenever you are outside. HEART DISEASE, DIABETES, AND HIGH BLOOD PRESSURE   Have your blood pressure checked at least every 1-2 years. High blood pressure causes heart disease and increases the risk of stroke.  If you are between 26 years and 34 years old, ask your health care provider if you should take aspirin to prevent strokes.  Have regular diabetes screenings. This involves taking a blood sample to check your  fasting blood sugar level.  If you are at a normal weight and have a low risk for diabetes, have this test once every three years after 51 years of age.  If you are overweight and have a high risk for diabetes, consider being tested at a younger age or more often. PREVENTING INFECTION  Hepatitis B  If you have a higher risk for hepatitis B, you should be screened for this virus. You are considered at high risk for hepatitis B if:  You were born in a country where hepatitis B is common. Ask your health care provider which countries are considered high risk.  Your parents were born in a high-risk country, and you have not been immunized against hepatitis B (hepatitis B vaccine).  You have HIV or AIDS.  You use needles to inject street drugs.  You live with someone who has hepatitis B.  You have had sex with someone who has hepatitis B.  You get hemodialysis treatment.  You take certain medicines for conditions, including cancer, organ transplantation, and autoimmune conditions. Hepatitis C  Blood testing is recommended for:  Everyone born from [birth year] through 1966.  Anyone with known risk factors for hepatitis C. Sexually transmitted infections (STIs)  You should be screened for sexually transmitted infections (STIs) including gonorrhea and chlamydia if:  You are sexually active and are younger than 51 years of age.  You are older than 51 years of age and your health care provider tells you that you are at risk for this type of infection.  Your sexual activity has changed since you were last screened and you are at an increased risk for chlamydia or gonorrhea. Ask your health care provider if you are at risk.  If you do not have HIV, but are at risk, it may be recommended that you take a prescription medicine daily to prevent HIV infection. This is called pre-exposure prophylaxis (PrEP). You are considered at risk if:  You are sexually active and do not regularly use condoms or  know the HIV status of your partner(s).  You take drugs by injection.  You are sexually active with a partner who has HIV. Talk with your health care provider about whether you are at high risk of being infected with HIV. If you choose to begin PrEP, you should first be tested for HIV. You should then be tested  every 3 months for as long as you are taking PrEP.  PREGNANCY   If you are premenopausal and you may become pregnant, ask your health care provider about preconception counseling.  If you may become pregnant, take 400 to 800 micrograms (mcg) of folic acid every day.  If you want to prevent pregnancy, talk to your health care provider about birth control (contraception). OSTEOPOROSIS AND MENOPAUSE   Osteoporosis is a disease in which the bones lose minerals and strength with aging. This can result in serious bone fractures. Your risk for osteoporosis can be identified using a bone density scan.  If you are 67 years of age or older, or if you are at risk for osteoporosis and fractures, ask your health care provider if you should be screened.  Ask your health care provider whether you should take a calcium or vitamin D supplement to lower your risk for osteoporosis.  Menopause may have certain physical symptoms and risks.  Hormone replacement therapy may reduce some of these symptoms and risks. Talk to your health care provider about whether hormone replacement therapy is right for you.  HOME CARE INSTRUCTIONS   Schedule regular health, dental, and eye exams.  Stay current with your immunizations.   Do not use any tobacco products including cigarettes, chewing tobacco, or electronic cigarettes.  If you are pregnant, do not drink alcohol.  If you are breastfeeding, limit how much and how often you drink alcohol.  Limit alcohol intake to no more than 1 drink per day for nonpregnant women. One drink equals 12 ounces of beer, 5 ounces of wine, or 1 ounces of hard liquor.  Do  not use street drugs.  Do not share needles.  Ask your health care provider for help if you need support or information about quitting drugs.  Tell your health care provider if you often feel depressed.  Tell your health care provider if you have ever been abused or do not feel safe at home. Document Released: 09/26/2010 Document Revised: 07/28/2013 Document Reviewed: 02/12/2013 [facility] Patient Information 2015 [place], [place]. This information is not intended to replace advice given to you by your health care provider. Make sure you discuss any questions you have with your health care provider.

## 2015-08-27 LAB — PAP IG W/ RFLX HPV ASCU

## 2015-09-07 ENCOUNTER — Encounter: Payer: Self-pay | Admitting: Gynecology

## 2015-09-07 ENCOUNTER — Other Ambulatory Visit: Payer: Self-pay | Admitting: Gynecology

## 2015-09-07 ENCOUNTER — Ambulatory Visit (INDEPENDENT_AMBULATORY_CARE_PROVIDER_SITE_OTHER): Payer: 59

## 2015-09-07 ENCOUNTER — Telehealth: Payer: Self-pay | Admitting: Gynecology

## 2015-09-07 DIAGNOSIS — M8580 Other specified disorders of bone density and structure, unspecified site: Secondary | ICD-10-CM

## 2015-09-07 DIAGNOSIS — Z1321 Encounter for screening for nutritional disorder: Secondary | ICD-10-CM

## 2015-09-07 DIAGNOSIS — Z13820 Encounter for screening for osteoporosis: Secondary | ICD-10-CM | POA: Diagnosis not present

## 2015-09-07 DIAGNOSIS — M899 Disorder of bone, unspecified: Secondary | ICD-10-CM | POA: Diagnosis not present

## 2015-09-07 DIAGNOSIS — Z1329 Encounter for screening for other suspected endocrine disorder: Secondary | ICD-10-CM

## 2015-09-07 NOTE — Telephone Encounter (Signed)
Tell patient bone density shows worsening of her osteopenia. Not to the degree of osteoporosis but getting close. Recommend baseline comprehensive metabolic panel, TSH, PTH, vitamin D and office visit to discuss treatment options.

## 2015-09-07 NOTE — Telephone Encounter (Signed)
Left message for pt to call.

## 2015-09-07 NOTE — Telephone Encounter (Signed)
Pt aware, orders placed, coming 09/10/15 @ 8:45am

## 2015-09-10 ENCOUNTER — Other Ambulatory Visit: Payer: Medicare Other

## 2015-09-10 ENCOUNTER — Other Ambulatory Visit: Payer: Self-pay | Admitting: *Deleted

## 2015-09-10 DIAGNOSIS — Z1321 Encounter for screening for nutritional disorder: Secondary | ICD-10-CM | POA: Diagnosis not present

## 2015-09-10 DIAGNOSIS — M8580 Other specified disorders of bone density and structure, unspecified site: Secondary | ICD-10-CM

## 2015-09-10 DIAGNOSIS — Z1329 Encounter for screening for other suspected endocrine disorder: Secondary | ICD-10-CM | POA: Diagnosis not present

## 2015-09-10 LAB — COMPREHENSIVE METABOLIC PANEL
ALT: 20 U/L (ref 6–29)
AST: 23 U/L (ref 10–35)
Albumin: 3.6 g/dL (ref 3.6–5.1)
Alkaline Phosphatase: 61 U/L (ref 33–130)
BUN: 18 mg/dL (ref 7–25)
CO2: 27 mmol/L (ref 20–31)
Calcium: 9 mg/dL (ref 8.6–10.4)
Chloride: 105 mmol/L (ref 98–110)
Creat: 0.98 mg/dL (ref 0.50–1.05)
Glucose, Bld: 61 mg/dL — ABNORMAL LOW (ref 65–99)
Potassium: 3.5 mmol/L (ref 3.5–5.3)
Sodium: 140 mmol/L (ref 135–146)
Total Bilirubin: 0.4 mg/dL (ref 0.2–1.2)
Total Protein: 6 g/dL — ABNORMAL LOW (ref 6.1–8.1)

## 2015-09-10 LAB — TSH: TSH: 1.27 mIU/L

## 2015-09-10 LAB — VITAMIN D 25 HYDROXY (VIT D DEFICIENCY, FRACTURES): Vit D, 25-Hydroxy: 34 ng/mL (ref 30–100)

## 2015-09-13 LAB — PARATHYROID HORMONE, INTACT (NO CA): PTH: 36 pg/mL (ref 14–64)

## 2015-09-15 ENCOUNTER — Encounter: Payer: Self-pay | Admitting: Cardiovascular Disease

## 2015-09-15 ENCOUNTER — Ambulatory Visit (INDEPENDENT_AMBULATORY_CARE_PROVIDER_SITE_OTHER): Payer: 59 | Admitting: Cardiovascular Disease

## 2015-09-15 VITALS — BP 102/66 | HR 51 | Ht 64.0 in | Wt 149.0 lb

## 2015-09-15 DIAGNOSIS — R0789 Other chest pain: Secondary | ICD-10-CM

## 2015-09-15 DIAGNOSIS — I10 Essential (primary) hypertension: Secondary | ICD-10-CM | POA: Diagnosis not present

## 2015-09-15 NOTE — Assessment & Plan Note (Signed)
istory of atypical chest pain with negative adequate GXT one year ago.

## 2015-09-15 NOTE — Patient Instructions (Signed)

## 2015-09-15 NOTE — Assessment & Plan Note (Signed)
History of hypertension blood pressure measured at 102/66. She is on amlodipine and benazepril. Continue current meds at current dosing

## 2015-09-15 NOTE — Progress Notes (Signed)
09/15/2015 Marie Peterson   [birth year]  [id number]  Primary Physician Marie Gravel, MD Primary Cardiologist: Marie Harp MD Marie Peterson  HPI:  Marie Peterson is a 50 year old mildly overweight married an American female mother of 2 daughters one of them goes to the [employer]. And the other graduated from [employer] and is now getting her MBA at state., who is formally a patient of Marie Peterson. She currently is out on disability because of SLE. I last saw her in the office 10/21/14.Her factors include treated hypertension. Her father had bypass surgery at age 53. She's never had a heart attack or stroke. She denies chest pain or shortness of breath. She does have GERD. Since I saw her in the office one year ago she continues to have occasional atypical chest pain. A routine GXT performed 11/12/14 was entirely normal.  Current Outpatient Prescriptions  Medication Sig Dispense Refill  . amLODipine (NORVASC) 5 MG tablet Take 5 mg by mouth daily.    [name] Kitchen aspirin 81 MG tablet Take 81 mg by mouth daily.    . benazepril (LOTENSIN) 40 MG tablet Take 1 tablet by mouth daily.  1  . Cholecalciferol (VITAMIN D PO) Take 1,000 Int'l Units by mouth daily.     . diclofenac sodium (VOLTAREN) 1 % GEL Apply topically.    [name] Kitchen FERREX 150 150 MG capsule Take 1 tablet by mouth daily.  1  . furosemide (LASIX) 40 MG tablet Take 1 tablet by mouth 2 (two) times daily.  0  . HYDROcodone-acetaminophen (NORCO) 10-325 MG per tablet Take 1 tablet by mouth every 6 (six) hours as needed for pain. 1-3 Tabs per day as needed    . hydroxychloroquine (PLAQUENIL) 200 MG tablet Take 2 tablets by mouth daily.  0  . L-Methylfolate-B6-B12 (METANX PO) Take 1 tablet by mouth daily.    [name] Kitchen lidocaine (LIDODERM) 5 % Place 3 patches onto the skin daily. Remove & Discard patch within 12 hours or as directed by MD 90 patch 6  . LYRICA 150 MG capsule take 1 capsule by mouth twice a day 60 capsule 5  . magnesium oxide (MAG-OX) 400 (241.3 MG) MG  tablet take 1 tablet by mouth once daily 30 tablet 5  . Multiple Vitamin (MULTIVITAMIN) tablet Take 1 tablet by mouth daily.    . nabumetone (RELAFEN) 500 MG tablet Take 500 mg by mouth 2 (two) times daily as needed.  0  . oxybutynin (DITROPAN-XL) 10 MG 24 hr tablet Take 1 tablet by mouth daily.  0  . pantoprazole (PROTONIX) 40 MG tablet Take 1 tablet (40 mg total) by mouth daily. 30 tablet 7  . potassium chloride SA (K-DUR,KLOR-CON) 20 MEQ tablet take 1 tablet by mouth once daily 30 tablet 11  . predniSONE (DELTASONE) 1 MG tablet Take 1 tablet by mouth daily. Takes with Prednisone 5 mg.  0  . predniSONE (DELTASONE) 5 MG tablet Take 5 mg by mouth daily.    . SUMAtriptan (IMITREX) 100 MG tablet Take one tab at onset of headache. May repeat in 2 hours if headache persists or recurs. 10 tablet 11  . temazepam (RESTORIL) 15 MG capsule Take 15 mg by mouth daily.  0  . topiramate (TOPAMAX) 50 MG tablet Take 1 tablet (50 mg total) by mouth 2 (two) times daily. 60 tablet 11  . zolpidem (AMBIEN) 10 MG tablet Take 1 tablet by mouth at bedtime as needed.  0   No current facility-administered medications  for this visit.    Allergies  Allergen Reactions  . Myrbetriq [Mirabegron] Itching    Social History   Social History  . Marital Status: Married    Spouse Name: Marie Peterson  . Number of Children: 2  . Years of Education: College   Occupational History  .      disability   Social History Main Topics  . Smoking status: Never Smoker   . Smokeless tobacco: Never Used  . Alcohol Use: No  . Drug Use: No  . Sexual Activity: Not Currently    Birth Control/ Protection: Surgical, Post-menopausal     Comment: Tubal lig-1st intercourse 50 yo-Fewer than 5 partners   Other Topics Concern  . Not on file   Social History Narrative   Patient lives at home with her family.   Caffeine Use: 2 cups of coffee daily.      Review of Systems: General: negative for chills, fever, night sweats or weight  changes.  Cardiovascular: negative for chest pain, dyspnea on exertion, edema, orthopnea, palpitations, paroxysmal nocturnal dyspnea or shortness of breath Dermatological: negative for rash Respiratory: negative for cough or wheezing Urologic: negative for hematuria Abdominal: negative for nausea, vomiting, diarrhea, bright red blood per rectum, melena, or hematemesis Neurologic: negative for visual changes, syncope, or dizziness All other systems reviewed and are otherwise negative except as noted above.    Blood pressure 102/66, pulse 51, height 5\' 4"  (1.626 m), weight 149 lb (67.586 kg).  General appearance: alert and no distress Neck: no adenopathy, no carotid bruit, no JVD, supple, symmetrical, trachea midline and thyroid not enlarged, symmetric, no tenderness/mass/nodules Lungs: clear to auscultation bilaterally Heart: regular rate and rhythm, S1, S2 normal, no murmur, click, rub or gallop Extremities: extremities normal, atraumatic, no cyanosis or edema  EKG sinus bradycardia 51 without ST or T-wave changes. I personally reviewed this EKG  ASSESSMENT AND PLAN:   Essential hypertension History of hypertension blood pressure measured at 102/66. She is on amlodipine and benazepril. Continue current meds at current dosing  Atypical chest pain istory of atypical chest pain with negative adequate GXT one year ago.      Marie Harp MD FACP,FACC,FAHA, [facility] 09/15/2015 2:31 PM

## 2015-09-16 ENCOUNTER — Ambulatory Visit (INDEPENDENT_AMBULATORY_CARE_PROVIDER_SITE_OTHER): Payer: 59 | Admitting: Gynecology

## 2015-09-16 ENCOUNTER — Encounter: Payer: Self-pay | Admitting: Gynecology

## 2015-09-16 VITALS — BP 110/64

## 2015-09-16 DIAGNOSIS — M8580 Other specified disorders of bone density and structure, unspecified site: Secondary | ICD-10-CM | POA: Diagnosis not present

## 2015-09-16 DIAGNOSIS — M069 Rheumatoid arthritis, unspecified: Secondary | ICD-10-CM

## 2015-09-16 MED ORDER — ALENDRONATE SODIUM 70 MG PO TABS: 70.0000 mg | ORAL_TABLET | ORAL | Status: DC

## 2015-09-16 NOTE — Progress Notes (Signed)
    Marie Peterson March 24, 1966 [id number]        50 y.o.  [id number] presents for discussion of her most recent bone density which shows a T score of -2.3 AP spine with a 6.5% loss from her prior DEXA 2015, 4% loss left hip, 5% loss right hip all statistically significant.  Continues on prednisone daily for her rheumatoid arthritis. Workup for secondary causes to include normal comprehensive metabolic panel, TSH, PTH and vitamin D of 32.  Past medical history,surgical history, problem list, medications, allergies, family history and social history were all reviewed and documented in the EPIC chart.  Directed ROS with pertinent positives and negatives documented in the history of present illness/assessment and plan.  Exam: Filed Vitals:   09/16/15 0800  BP: 110/64   General appearance:  Normal   Assessment/Plan:  50 y.o. [id number] with worsening osteopenia and a statistically significant decline at all measured sites from prior DEXA 2 years ago. FRAX calculation 3%/0.3%. Continues on daily prednisone. Workup for secondary causes negative. I reviewed in detail with the patient her DEXA report discussing T-scores and Z scores. I reviewed my recommendation to consider treatment now although her calculated FRAX risk of fracture is low she is planning to continue on prednisone daily I feel is more appropriate to intervene now then to wait for significant continued loss.  I reviewed treatment options with her to include his phosphates, Evista, Prolia, Forteo. The risks versus benefits of each treatment option were reviewed including GERD, osteonecrosis of the jaw, atypical fractures, rashes, infections, thrombosis risks, need for daily injections and the osteosarcoma issue. After lengthy discussion the patient and I both agree to initiate alendronate 70 mg weekly. I reviewed how to take the medication and the need to call if she experiences any of the side effects. She has a normal BUN and creatinine and calcium.   Patient will go ahead and initiate call me if she has any issues. We'll plan on a 5 year treatment course with follow up DEXA in 2 years. Patient does have an appointment with her rheumatologist next week and have asked her to show him a copy of her DEXA that I provided her and that he knows that we started her on Fosamax to make sure he has no issues with this.  Greater than 50% of my time was spent in direct face to face counseling and coordination of care with the patient.  Anastasio Auerbach MD, 8:23 AM 09/16/2015

## 2015-09-16 NOTE — Patient Instructions (Signed)
Alendronate tablets  What is this medicine?  ALENDRONATE (a LEN droe nate) slows calcium loss from bones. It helps to make normal healthy bone and to slow bone loss in people with Paget's disease and osteoporosis. It may be used in others at risk for bone loss.  This medicine may be used for other purposes; ask your health care provider or pharmacist if you have questions.  What should I tell my health care provider before I take this medicine?  They need to know if you have any of these conditions:  -dental disease  -esophagus, stomach, or intestine problems, like acid reflux or GERD  -kidney disease  -low blood calcium  -low vitamin D  -problems sitting or standing 30 minutes  -trouble swallowing  -an unusual or allergic reaction to alendronate, other medicines, foods, dyes, or preservatives  -pregnant or trying to get pregnant  -breast-feeding  How should I use this medicine?  You must take this medicine exactly as directed or you will lower the amount of the medicine you absorb into your body or you may cause yourself harm. Take this medicine by mouth first thing in the morning, after you are up for the day. Do not eat or drink anything before you take your medicine. Swallow the tablet with a full glass (6 to 8 fluid ounces) of plain water. Do not take this medicine with any other drink. Do not chew or crush the tablet. After taking this medicine, do not eat breakfast, drink, or take any medicines or vitamins for at least 30 minutes. Sit or stand up for at least 30 minutes after you take this medicine; do not lie down. Do not take your medicine more often than directed.  Talk to your pediatrician regarding the use of this medicine in children. Special care may be needed.  Overdosage: If you think you have taken too much of this medicine contact a poison control center or emergency room at once.  NOTE: This medicine is only for you. Do not share this medicine with others.  What if I miss a dose?  If you miss a  dose, do not take it later in the day. Continue your normal schedule starting the next morning. Do not take double or extra doses.  What may interact with this medicine?  -aluminum hydroxide  -antacids  -aspirin  -calcium supplements  -drugs for inflammation like ibuprofen, naproxen, and others  -iron supplements  -magnesium supplements  -vitamins with minerals  This list may not describe all possible interactions. Give your health care provider a list of all the medicines, herbs, non-prescription drugs, or dietary supplements you use. Also tell them if you smoke, drink alcohol, or use illegal drugs. Some items may interact with your medicine.  What should I watch for while using this medicine?  Visit your doctor or health care professional for regular checks ups. It may be some time before you see benefit from this medicine. Do not stop taking your medicine except on your doctor's advice. Your doctor or health care professional may order blood tests and other tests to see how you are doing.  You should make sure you get enough calcium and vitamin D while you are taking this medicine, unless your doctor tells you not to. Discuss the foods you eat and the vitamins you take with your health care professional.  Some people who take this medicine have severe bone, joint, and/or muscle pain. This medicine may also increase your risk for a broken thigh bone.   Tell your doctor right away if you have pain in your upper leg or groin. Tell your doctor if you have any pain that does not go away or that gets worse.  This medicine can make you more sensitive to the sun. If you get a rash while taking this medicine, sunlight may cause the rash to get worse. Keep out of the sun. If you cannot avoid being in the sun, wear protective clothing and use sunscreen. Do not use sun lamps or tanning beds/booths.  What side effects may I notice from receiving this medicine?  Side effects that you should report to your doctor or health care  professional as soon as possible:  -allergic reactions like skin rash, itching or hives, swelling of the face, lips, or tongue  -black or tarry stools  -bone, muscle or joint pain  -changes in vision  -chest pain  -heartburn or stomach pain  -jaw pain, especially after dental work  -pain or trouble when swallowing  -redness, blistering, peeling or loosening of the skin, including inside the mouth  Side effects that usually do not require medical attention (report to your doctor or health care professional if they continue or are bothersome):  -changes in taste  -diarrhea or constipation  -eye pain or itching  -headache  -nausea or vomiting  -stomach gas or fullness  This list may not describe all possible side effects. Call your doctor for medical advice about side effects. You may report side effects to FDA at 1-800-FDA-1088.  Where should I keep my medicine?  Keep out of the reach of children.  Store at room temperature of 15 and 30 degrees C (59 and 86 degrees F). Throw away any unused medicine after the expiration date.  NOTE: This sheet is a summary. It may not cover all possible information. If you have questions about this medicine, talk to your doctor, pharmacist, or health care provider.      2016, [employer]vier/[employer] Standard. (2010-09-09 08:56:09)

## 2015-09-21 ENCOUNTER — Other Ambulatory Visit: Payer: Self-pay | Admitting: *Deleted

## 2015-09-21 ENCOUNTER — Encounter: Payer: Self-pay | Admitting: Cardiovascular Disease

## 2015-09-21 DIAGNOSIS — Z79899 Other long term (current) drug therapy: Secondary | ICD-10-CM | POA: Diagnosis not present

## 2015-09-21 DIAGNOSIS — M329 Systemic lupus erythematosus, unspecified: Secondary | ICD-10-CM | POA: Diagnosis not present

## 2015-09-21 MED ORDER — PANTOPRAZOLE SODIUM 40 MG PO TBEC: 40.0000 mg | DELAYED_RELEASE_TABLET | Freq: Every day | ORAL | Status: DC

## 2015-09-30 DIAGNOSIS — H2513 Age-related nuclear cataract, bilateral: Secondary | ICD-10-CM | POA: Diagnosis not present

## 2015-09-30 DIAGNOSIS — H25013 Cortical age-related cataract, bilateral: Secondary | ICD-10-CM | POA: Diagnosis not present

## 2015-09-30 DIAGNOSIS — Z79899 Other long term (current) drug therapy: Secondary | ICD-10-CM | POA: Diagnosis not present

## 2015-09-30 DIAGNOSIS — M329 Systemic lupus erythematosus, unspecified: Secondary | ICD-10-CM | POA: Diagnosis not present

## 2015-09-30 DIAGNOSIS — H524 Presbyopia: Secondary | ICD-10-CM | POA: Diagnosis not present

## 2015-10-01 ENCOUNTER — Encounter: Payer: Self-pay | Admitting: Cardiovascular Disease

## 2015-10-11 ENCOUNTER — Encounter: Payer: Self-pay | Admitting: Adult Health

## 2015-10-11 ENCOUNTER — Ambulatory Visit (INDEPENDENT_AMBULATORY_CARE_PROVIDER_SITE_OTHER): Payer: 59 | Admitting: Adult Health

## 2015-10-11 ENCOUNTER — Telehealth: Payer: Self-pay | Admitting: Neurology

## 2015-10-11 VITALS — BP 122/60 | HR 56 | Ht 64.0 in | Wt 154.4 lb

## 2015-10-11 DIAGNOSIS — G43019 Migraine without aura, intractable, without status migrainosus: Secondary | ICD-10-CM | POA: Diagnosis not present

## 2015-10-11 NOTE — Patient Instructions (Addendum)
Increase Topamax to 1 tablet in the morning and 2 tablets in the evening.  If tolerating and beneficial for headache will switch to long acting preparation if memory is affected Limit use of Imitrex and hydrocodone as it can cause rebound headache. If your symptoms worsen or you develop new symptoms please let us know.

## 2015-10-11 NOTE — Progress Notes (Signed)
PATIENT: Marie Peterson DOB: February 12, 1966  REASON FOR VISIT: follow up- migraines HISTORY FROM: patient  HISTORY OF PRESENT ILLNESS: Marie Peterson is a 50 year old female with a history of migraine headaches. She returns today after suffering with a headache for over one week. She states the headache started last Sunday. She states that it has been on and off for 1 week. She states some days the severity is worse than others. It is typically located above the left eye. She does have photophobia and phonophobia and nausea but no vomiting. She states that she's been taking Imitrex  daily since the headache started as well as hydrocodone. She is still on Topamax 50 mg in the morning and evening. She reports she has noticed some memory trouble with Topamax. At times she feels that she cannot get the right words out. She reports her daughter has noticed it as well. She states that she is  on prednisone for lupus.   Interval hsitory 11/02/2014 [facility]): She is on the Topamax. She gets migraines twice a month. Migraines are more on the right. She has nausea, light sensitivity. No aura. They can last all day. She is in bed most of the day. She takes the imitrex at the onset and it helps. No side effects from the topamax and imitrex. She uses the Lidoderm patches for her post herpetic neuralgia but it was $75. It gets worse in the winter time, now it is ok. Pain is better in the summer. Lyrica helped a lot. She is having pains in her feet at the bottom and it wakes her up. She has sharp pains in the heels. The pain is when she walks on her heel. And is tender at night in the heel with sharp shooting pains in the heel. Pain right > left heel.    HPI: Marie Peterson is a 50 y.o. female here as a follow up for small-fiber neuropathy. Previously evaluated and seen by Dr. Leta Baptist and is transitioning to me. She is still on the neurontin and topical cream. She still gets the burning in her feet which is worsening. She had  shingles and she still has pain in the right side of her torso. Now she also has burning and weakness in her hands too. She has migraines and Dr. Maudie Mercury started her on Topamax 50mg  twice a day and it is helping with the migraines.   REVIEW OF SYSTEMS: Out of a complete 14 system review of symptoms, the patient complains only of the following symptoms, and all other reviewed systems are negative.  See HPI  ALLERGIES: Allergies  Allergen Reactions  . Myrbetriq [Mirabegron] Itching    HOME MEDICATIONS: Outpatient Prescriptions Prior to Visit  Medication Sig Dispense Refill  . alendronate (FOSAMAX) 70 MG tablet Take 1 tablet (70 mg total) by mouth every 7 (seven) days. Take with a full glass of water on an empty stomach. 4 tablet 11  . amLODipine (NORVASC) 5 MG tablet Take 5 mg by mouth daily.    [name] Kitchen aspirin 81 MG tablet Take 81 mg by mouth daily.    . benazepril (LOTENSIN) 40 MG tablet Take 1 tablet by mouth daily.  1  . Cholecalciferol (VITAMIN D PO) Take 1,000 Int'l Units by mouth daily.     . diclofenac sodium (VOLTAREN) 1 % GEL Apply topically.    [name] Kitchen FERREX 150 150 MG capsule Take 1 tablet by mouth daily.  1  . furosemide (LASIX) 40 MG tablet Take 1 tablet  by mouth 2 (two) times daily.  0  . HYDROcodone-acetaminophen (NORCO) 10-325 MG per tablet Take 1 tablet by mouth every 6 (six) hours as needed for pain. 1-3 Tabs per day as needed    . hydroxychloroquine (PLAQUENIL) 200 MG tablet Take 2 tablets by mouth daily.  0  . L-Methylfolate-B6-B12 (METANX PO) Take 1 tablet by mouth daily.    [name] Kitchen lidocaine (LIDODERM) 5 % Place 3 patches onto the skin daily. Remove & Discard patch within 12 hours or as directed by MD 90 patch 6  . LYRICA 150 MG capsule take 1 capsule by mouth twice a day 60 capsule 5  . magnesium oxide (MAG-OX) 400 (241.3 MG) MG tablet take 1 tablet by mouth once daily 30 tablet 5  . Multiple Vitamin (MULTIVITAMIN) tablet Take 1 tablet by mouth daily.    . nabumetone (RELAFEN) 500 MG  tablet Take 500 mg by mouth 2 (two) times daily as needed.  0  . oxybutynin (DITROPAN-XL) 10 MG 24 hr tablet Take 1 tablet by mouth daily.  0  . pantoprazole (PROTONIX) 40 MG tablet Take 1 tablet (40 mg total) by mouth daily. 30 tablet 11  . potassium chloride SA (K-DUR,KLOR-CON) 20 MEQ tablet take 1 tablet by mouth once daily 30 tablet 11  . predniSONE (DELTASONE) 1 MG tablet Take 1 tablet by mouth daily. Takes with Prednisone 5 mg.  0  . predniSONE (DELTASONE) 5 MG tablet Take 5 mg by mouth daily.    . SUMAtriptan (IMITREX) 100 MG tablet Take one tab at onset of headache. May repeat in 2 hours if headache persists or recurs. 10 tablet 11  . temazepam (RESTORIL) 15 MG capsule Take 15 mg by mouth daily.  0  . topiramate (TOPAMAX) 50 MG tablet Take 1 tablet (50 mg total) by mouth 2 (two) times daily. 60 tablet 11  . zolpidem (AMBIEN) 10 MG tablet Take 1 tablet by mouth at bedtime as needed.  0   No facility-administered medications prior to visit.    PAST MEDICAL HISTORY: Past Medical History  Diagnosis Date  . CIN I (cervical intraepithelial neoplasia I) 1990  . Atrophic vaginitis   . Migraines   . Lupus nephritis ([place])   . Fibroid   . Arthritis   . Premature menopause age 8    following chemotherapy for Lupus  . Hypertension   . Osteopenia 08/2015    T score -2.3 FRAX 3.2%/0.3%  . Atypical chest pain     thought to be GERD; myoview 05/17/07-no significant ischemia; echo 07/20/11- normal EF=>55%  . Baker's cyst 03/28/12    venous doppler 03/28/12=no thrombus; baker's cyst at left popliteal fossa  . Chest pain   . Shingles   . Neuropathy ([place])     PAST SURGICAL HISTORY: Past Surgical History  Procedure Laterality Date  . Cesarean section    . Myomectomy      Hysteroscopic myomectomy  . Hysteroscopy      Myomectomy  . Tubal ligation    . Colposcopy      FAMILY HISTORY: Family History  Problem Relation Age of Onset  . Hypertension Mother   . Asthma Mother   . Diabetes  Father   . Hypertension Father   . Heart disease Father     SOCIAL HISTORY: Social History   Social History  . Marital Status: Married    Spouse Name: [name]  . Number of Children: 2  . Years of Education: College   Occupational History  .  disability   Social History Main Topics  . Smoking status: Never Smoker   . Smokeless tobacco: Never Used  . Alcohol Use: No  . Drug Use: No  . Sexual Activity: Not Currently    Birth Control/ Protection: Surgical, Post-menopausal     Comment: Tubal lig-1st intercourse 50 yo-Fewer than 5 partners   Other Topics Concern  . Not on file   Social History Narrative   Patient lives at home with her family.   Caffeine Use: 2 cups of coffee daily.       PHYSICAL EXAM  Filed Vitals:   10/11/15 1341  BP: 122/60  Pulse: 56  Height: 5\' 4"  (1.626 m)  Weight: 154 lb 6.4 oz (70.035 kg)   Body mass index is 26.49 kg/(m^2).  Generalized: Well developed, in no acute distress   Neurological examination  Mentation: Alert oriented to time, place, history taking. Follows all commands speech and language fluent Cranial nerve II-XII: Pupils were equal round reactive to light. Extraocular movements were full, visual field were full on confrontational test. Facial sensation and strength were normal. Uvula tongue midline. Head turning and shoulder shrug  were normal and symmetric. Motor: The motor testing reveals 5 over 5 strength of all 4 extremities. Good symmetric motor tone is noted throughout.  Sensory: Sensory testing is intact to soft touch on all 4 extremities. No evidence of extinction is noted.  Coordination: Cerebellar testing reveals good finger-nose-finger and heel-to-shin bilaterally.  Gait and station: Gait is normal. Tandem gait is normal. Romberg is negative. No drift is seen.  Reflexes: Deep tendon reflexes are symmetric and normal bilaterally.   DIAGNOSTIC DATA (LABS, IMAGING, TESTING) - I reviewed patient records, labs,  notes, testing and imaging myself where available.   ASSESSMENT AND PLAN 50 y.o. year old female  has a past medical history of CIN I (cervical intraepithelial neoplasia I) (1990); Atrophic vaginitis; Migraines; Lupus nephritis ([place]); Fibroid; Arthritis; Premature menopause (age 89); Hypertension; Osteopenia (08/2015); Atypical chest pain; Baker's cyst (03/28/12); Chest pain; Shingles; and Neuropathy ([place]). here with:  1. Migraine headache  The patient will increase Topamax to 50 mg in the morning and 100 mg in the evening. She has noticed some memory difficulty with Topamax. I advised that increasing the dose may make this worse. In the future we may have to switch her to a long-acting preparation in the hopes of improving the side effects. Advised that taking Imitrex and hydrocodone daily can actually cause rebound headaches. She should limit her usage. Advised that if her headache does not resolve in the couple of days we can have her come in for an infusion. Patient voiced understanding. She will follow-up in 3-4 months or sooner if needed.    Ward Givens, MSN, NP-C 10/11/2015, 1:38 PM [facility] [address] [place], [place] [address] [phone]

## 2015-10-11 NOTE — Telephone Encounter (Signed)
Patient reports worsening of migraines that increase at night that started last Sunday.  An appointment was made for today with Megan at 1:30. Advised that the nurse would call if there was any other questions. This is Dr Jaynee Eagles pt.

## 2015-10-12 DIAGNOSIS — G43909 Migraine, unspecified, not intractable, without status migrainosus: Secondary | ICD-10-CM | POA: Insufficient documentation

## 2015-10-12 NOTE — Progress Notes (Signed)
I agree with the above plan 

## 2015-10-15 NOTE — Telephone Encounter (Signed)
Noted/fim 

## 2015-10-15 NOTE — Telephone Encounter (Signed)
Patient called, was seen by Jinny Blossom 7/17 for migraine, patient states she is still having migraine, not as severe, was advised by Jinny Blossom to call back if continues to have migraine, patient requests appointment with Dr. Jaynee Eagles next week when she is back in the office. Appointment scheduled with Dr. Jaynee Eagles for Tuesday July 25th.

## 2015-10-19 ENCOUNTER — Encounter: Payer: Self-pay | Admitting: Neurology

## 2015-10-19 ENCOUNTER — Ambulatory Visit (INDEPENDENT_AMBULATORY_CARE_PROVIDER_SITE_OTHER): Payer: 59 | Admitting: Neurology

## 2015-10-19 VITALS — BP 113/66 | HR 60 | Ht 64.0 in | Wt 151.6 lb

## 2015-10-19 DIAGNOSIS — G43009 Migraine without aura, not intractable, without status migrainosus: Secondary | ICD-10-CM

## 2015-10-19 MED ORDER — KETOROLAC TROMETHAMINE 60 MG/2ML IM SOLN
60.0000 mg | Freq: Once | INTRAMUSCULAR | Status: AC
  Administered 2015-10-19: 60 mg via INTRAMUSCULAR

## 2015-10-19 MED ORDER — ONDANSETRON 4 MG PO TBDP: 4.0000 mg | ORAL_TABLET | Freq: Three times a day (TID) | ORAL | 11 refills | Status: DC | PRN

## 2015-10-19 MED ORDER — NORTRIPTYLINE HCL 25 MG PO CAPS: 25.0000 mg | ORAL_CAPSULE | Freq: Every day | ORAL | 12 refills | Status: DC

## 2015-10-19 NOTE — Patient Instructions (Addendum)
Remember to drink plenty of fluid, eat healthy meals and do not skip any meals. Try to eat protein with a every meal and eat a healthy snack such as fruit or nuts in between meals. Try to keep a regular sleep-wake schedule and try to exercise daily, particularly in the form of walking, 20-30 minutes a day, if you can.   As far as your medications are concerned, I would like to suggest: Start Nortriptyline at night before bed. Can take it with the Topamax (can also take both Topamax pills at night). In 2 weeks decrease Topamax to 1 pill daily 2 weeks later can discontinue Topamax At onset of headache take imitrex with zofran. Can repeat in 2 hours once. No more than 2x in a day or 2 days in a week  I would like to see you back in 3 months, sooner if we need to. Please call us with any interim questions, concerns, problems, updates or refill requests.   Our phone number is [phone]. We also have an after hours call service for urgent matters and there is a physician on-call for urgent questions. For any emergencies you know to call 911 or go to the nearest emergency room  Nortriptyline capsules What is this medicine? NORTRIPTYLINE (nor TRIP ti leen) is used to treat depression. This medicine may be used for other purposes; ask your health care provider or pharmacist if you have questions. What should I tell my health care provider before I take this medicine? They need to know if you have any of these conditions: -an alcohol problem -bipolar disorder or schizophrenia -difficulty passing urine, prostate trouble -glaucoma -heart disease or recent heart attack -liver disease -over active thyroid -seizures -thoughts or plans of suicide or a previous suicide attempt or family history of suicide attempt -an unusual or allergic reaction to nortriptyline, other medicines, foods, dyes, or preservatives -pregnant or trying to get pregnant -breast-feeding How should I use this medicine? Take  this medicine by mouth with a glass of water. Follow the directions on the prescription label. Take your doses at regular intervals. Do not take it more often than directed. Do not stop taking this medicine suddenly except upon the advice of your doctor. Stopping this medicine too quickly may cause serious side effects or your condition may worsen. A special MedGuide will be given to you by the pharmacist with each prescription and refill. Be sure to read this information carefully each time. Talk to your pediatrician regarding the use of this medicine in children. Special care may be needed. Overdosage: If you think you have taken too much of this medicine contact a poison control center or emergency room at once. NOTE: This medicine is only for you. Do not share this medicine with others. What if I miss a dose? If you miss a dose, take it as soon as you can. If it is almost time for your next dose, take only that dose. Do not take double or extra doses. What may interact with this medicine? Do not take this medicine with any of the following medications: -arsenic trioxide -certain medicines medicines for irregular heart beat -cisapride -halofantrine -linezolid -MAOIs like Carbex, Eldepryl, Marplan, Nardil, and Parnate -methylene blue (injected into a vein) -other medicines for mental depression -phenothiazines like perphenazine, thioridazine and chlorpromazine -pimozide -probucol -procarbazine -sparfloxacin -St. John's Wort -ziprasidone This medicine may also interact with any of the following medications: -atropine and related drugs like hyoscyamine, scopolamine, tolterodine and others -barbiturate medicines for inducing sleep  or treating seizures, such as phenobarbital -cimetidine -medicines for diabetes -medicines for seizures like carbamazepine or phenytoin -reserpine -thyroid medicine This list may not describe all possible interactions. Give your health care provider a list of  all the medicines, herbs, non-prescription drugs, or dietary supplements you use. Also tell them if you smoke, drink alcohol, or use illegal drugs. Some items may interact with your medicine. What should I watch for while using this medicine? Tell your doctor if your symptoms do not get better or if they get worse. Visit your doctor or health care professional for regular checks on your progress. Because it may take several weeks to see the full effects of this medicine, it is important to continue your treatment as prescribed by your doctor. Patients and their families should watch out for new or worsening thoughts of suicide or depression. Also watch out for sudden changes in feelings such as feeling anxious, agitated, panicky, irritable, hostile, aggressive, impulsive, severely restless, overly excited and hyperactive, or not being able to sleep. If this happens, especially at the beginning of treatment or after a change in dose, call your health care professional. Dennis Bast may get drowsy or dizzy. Do not drive, use machinery, or do anything that needs mental alertness until you know how this medicine affects you. Do not stand or sit up quickly, especially if you are an older patient. This reduces the risk of dizzy or fainting spells. Alcohol may interfere with the effect of this medicine. Avoid alcoholic drinks. Do not treat yourself for coughs, colds, or allergies without asking your doctor or health care professional for advice. Some ingredients can increase possible side effects. Your mouth may get dry. Chewing sugarless gum or sucking hard candy, and drinking plenty of water may help. Contact your doctor if the problem does not go away or is severe. This medicine may cause dry eyes and blurred vision. If you wear contact lenses you may feel some discomfort. Lubricating drops may help. See your eye doctor if the problem does not go away or is severe. This medicine can cause constipation. Try to have a  bowel movement at least every 2 to 3 days. If you do not have a bowel movement for 3 days, call your doctor or health care professional. This medicine can make you more sensitive to the sun. Keep out of the sun. If you cannot avoid being in the sun, wear protective clothing and use sunscreen. Do not use sun lamps or tanning beds/booths. What side effects may I notice from receiving this medicine? Side effects that you should report to your doctor or health care professional as soon as possible: -allergic reactions like skin rash, itching or hives, swelling of the face, lips, or tongue -abnormal production of milk in females -breast enlargement in both males and females -breathing problems -confusion, hallucinations -fever with increased sweating -irregular or fast, pounding heartbeat -muscle stiffness, or spasms -pain or difficulty passing urine, loss of bladder control -seizures -suicidal thoughts or other mood changes -swelling of the testicles -tingling, pain, or numbness in the feet or hands -yellowing of the eyes or skin Side effects that usually do not require medical attention (report to your doctor or health care professional if they continue or are bothersome): -change in sex drive or performance -diarrhea -nausea, vomiting -weight gain or loss This list may not describe all possible side effects. Call your doctor for medical advice about side effects. You may report side effects to FDA at 1-800-FDA-1088. Where should I keep  my medicine? Keep out of the reach of children. Store at room temperature between 15 and 30 degrees C (59 and 86 degrees F). Keep container tightly closed. Throw away any unused medicine after the expiration date. NOTE: This sheet is a summary. It may not cover all possible information. If you have questions about this medicine, talk to your doctor, pharmacist, or health care provider.    2016, Elsevier/Gold Standard. (2011-07-31 13:57:12)   Ondansetron  oral dissolving tablet What is this medicine? ONDANSETRON (on DAN se tron) is used to treat nausea and vomiting caused by chemotherapy. It is also used to prevent or treat nausea and vomiting after surgery. This medicine may be used for other purposes; ask your health care provider or pharmacist if you have questions. What should I tell my health care provider before I take this medicine? They need to know if you have any of these conditions: -heart disease -history of irregular heartbeat -liver disease -low levels of magnesium or potassium in the blood -an unusual or allergic reaction to ondansetron, granisetron, other medicines, foods, dyes, or preservatives -pregnant or trying to get pregnant -breast-feeding How should I use this medicine? These tablets are made to dissolve in the mouth. Do not try to push the tablet through the foil backing. With dry hands, peel away the foil backing and gently remove the tablet. Place the tablet in the mouth and allow it to dissolve, then swallow. While you may take these tablets with water, it is not necessary to do so. Talk to your pediatrician regarding the use of this medicine in children. Special care may be needed. Overdosage: If you think you have taken too much of this medicine contact a poison control center or emergency room at once. NOTE: This medicine is only for you. Do not share this medicine with others. What if I miss a dose? If you miss a dose, take it as soon as you can. If it is almost time for your next dose, take only that dose. Do not take double or extra doses. What may interact with this medicine? Do not take this medicine with any of the following medications: -apomorphine -certain medicines for fungal infections like fluconazole, itraconazole, ketoconazole, posaconazole, voriconazole -cisapride -dofetilide -dronedarone -pimozide -thioridazine -ziprasidone This medicine may also interact with the following  medications: -carbamazepine -certain medicines for depression, anxiety, or psychotic disturbances -fentanyl -linezolid -MAOIs like Carbex, Eldepryl, Marplan, Nardil, and Parnate -methylene blue (injected into a vein) -other medicines that prolong the QT interval (cause an abnormal heart rhythm) -phenytoin -rifampicin -tramadol This list may not describe all possible interactions. Give your health care provider a list of all the medicines, herbs, non-prescription drugs, or dietary supplements you use. Also tell them if you smoke, drink alcohol, or use illegal drugs. Some items may interact with your medicine. What should I watch for while using this medicine? Check with your doctor or health care professional as soon as you can if you have any sign of an allergic reaction. What side effects may I notice from receiving this medicine? Side effects that you should report to your doctor or health care professional as soon as possible: -allergic reactions like skin rash, itching or hives, swelling of the face, lips, or tongue -breathing problems -confusion -dizziness -fast or irregular heartbeat -feeling faint or lightheaded, falls -fever and chills -loss of balance or coordination -seizures -sweating -swelling of the hands and feet -tightness in the chest -tremors -unusually weak or tired Side effects that usually do not  require medical attention (report to your doctor or health care professional if they continue or are bothersome): -constipation or diarrhea -headache This list may not describe all possible side effects. Call your doctor for medical advice about side effects. You may report side effects to FDA at 1-800-FDA-1088. Where should I keep my medicine? Keep out of the reach of children. Store between 2 and 30 degrees C (36 and 86 degrees F). Throw away any unused medicine after the expiration date. NOTE: This sheet is a summary. It may not cover all possible information. If  you have questions about this medicine, talk to your doctor, pharmacist, or health care provider.    2016, Elsevier/Gold Standard. (2012-12-18 16:21:52)

## 2015-10-19 NOTE — Progress Notes (Signed)
[id number]    Provider:  Dr Jaynee Eagles Referring Provider: [name], MD Primary Care Physician:  [name], MD  CC:  Migraines   HPI:  Marie Peterson is a 50 y.o. female here as a referral from Dr. Maudie Mercury for continued migraines. Migraines started worsening about 2 weeks ago. Before this she was very well controlled. She had migraines every day. She had side effects to the Topamax, with increase started getting double vision, she went back to 50mg  bid. She feels she has memory loss with the Topamax. She has insomnia, she takes [place] every night, she still has problems. Migraines are on the left side. Pounding in her head. They last most of the day. Daily. +light sensitivity, +sound sensitivity. +nausea. No vomiting. They can be up to 10/10, last week it was awful 10/10 usually 6/10 on average of pain. Imitrex usually works. Advised to take with a zofran. Discussed triggers, discussed other medication possibilities.    Last visit: She has a PMHx of lupus with glomerulonephritis, osteoarthritis, shingles. She has been seen here in the office for small-fiber neuropathy as well as migraines. She is on the Topamax. She gets migraines twice a month. Migraines are more on the right. She has nausea, light sensitivity. No aura. They can last all day. She is in bed most of the day. She takes the imitrex at the onset and it helps. No side effects from the topamax and imitrex. She uses the Lidoderm patches for her post herpetic neuralgia but it was $75. It gets worse in the winter time, now it is ok. Pain is better in the summer. Lyrica helped a lot. She is having pains in her feet at the bottom and it wakes her up. She has sharp pains in the heels. The pain is when she walks on her heel. And is tender at night in the heel with sharp shooting pains in the heel. Pain right > left heel.   Medications tried for migraines: Imitrex, Topamax (50 qam and 100mg  qpm, had side effects of memory loss and  blurry vision),imitrex   Review of Systems: Patient complains of symptoms per HPI as well as the following symptoms: light sensitivity, blurred vision, joint pain, joint swelling, aching muscles, muscle cramps, rash, dizziness, headache. Pertinent negatives per HPI. All others negative.   Social History   Social History  . Marital status: Married    Spouse name: [name]  . Number of children: 2  . Years of education: College   Occupational History  .  Disabled    disability   Social History Main Topics  . Smoking status: Never Smoker  . Smokeless tobacco: Never Used  . Alcohol use No  . Drug use: No  . Sexual activity: Not Currently    Birth control/ protection: Surgical, Post-menopausal     Comment: Tubal lig-1st intercourse 50 yo-Fewer than 5 partners   Other Topics Concern  . Not on file   Social History Narrative   Patient lives at home with her family.   Caffeine Use: 2 cups of coffee daily.     Family History  Problem Relation Age of Onset  . Hypertension Mother   . Asthma Mother   . Diabetes Father   . Hypertension Father   . Heart disease Father     Past Medical History:  Diagnosis Date  . Arthritis   . Atrophic vaginitis   . Atypical chest pain    thought to be GERD; myoview 05/17/07-no significant ischemia;  echo 07/20/11- normal EF=>55%  . Baker's cyst 03/28/12   venous doppler 03/28/12=no thrombus; baker's cyst at left popliteal fossa  . Chest pain   . CIN I (cervical intraepithelial neoplasia I) 1990  . Fibroid   . Hypertension   . Lupus nephritis ([place])   . Migraines   . Neuropathy ([place])   . Osteopenia 08/2015   T score -2.3 FRAX 3.2%/0.3%  . Premature menopause age 57   following chemotherapy for Lupus  . Shingles     Past Surgical History:  Procedure Laterality Date  . CESAREAN SECTION    . COLPOSCOPY    . HYSTEROSCOPY     Myomectomy  . MYOMECTOMY     Hysteroscopic myomectomy  . TUBAL LIGATION      Current Outpatient Prescriptions    Medication Sig Dispense Refill  . alendronate (FOSAMAX) 70 MG tablet Take 1 tablet (70 mg total) by mouth every 7 (seven) days. Take with a full glass of water on an empty stomach. 4 tablet 11  . amLODipine (NORVASC) 5 MG tablet Take 5 mg by mouth daily.    [name] Kitchen aspirin 81 MG tablet Take 81 mg by mouth daily.    . benazepril (LOTENSIN) 40 MG tablet Take 1 tablet by mouth daily.  1  . Cholecalciferol (VITAMIN D PO) Take 1,000 Int'l Units by mouth daily.     . diclofenac sodium (VOLTAREN) 1 % GEL Apply topically.    [name] Kitchen FERREX 150 150 MG capsule Take 1 tablet by mouth daily.  1  . folic acid (FOLVITE) A999333 MCG tablet Take 400 mcg by mouth daily.    . furosemide (LASIX) 40 MG tablet Take 1 tablet by mouth 2 (two) times daily.  0  . HYDROcodone-acetaminophen (NORCO) 10-325 MG per tablet Take 1 tablet by mouth every 6 (six) hours as needed for pain. 1-3 Tabs per day as needed    . hydroxychloroquine (PLAQUENIL) 200 MG tablet Take 2 tablets by mouth daily.  0  . L-Methylfolate-B6-B12 (METANX PO) Take 1 tablet by mouth daily.    [name] Kitchen lidocaine (LIDODERM) 5 % Place 3 patches onto the skin daily. Remove & Discard patch within 12 hours or as directed by MD 90 patch 6  . LYRICA 150 MG capsule take 1 capsule by mouth twice a day 60 capsule 5  . magnesium oxide (MAG-OX) 400 (241.3 MG) MG tablet take 1 tablet by mouth once daily 30 tablet 5  . Multiple Vitamin (MULTIVITAMIN) tablet Take 1 tablet by mouth daily.    . nabumetone (RELAFEN) 500 MG tablet Take 500 mg by mouth 2 (two) times daily as needed.  0  . oxybutynin (DITROPAN-XL) 10 MG 24 hr tablet Take 1 tablet by mouth daily.  0  . pantoprazole (PROTONIX) 40 MG tablet Take 1 tablet (40 mg total) by mouth daily. 30 tablet 11  . potassium chloride SA (K-DUR,KLOR-CON) 20 MEQ tablet take 1 tablet by mouth once daily 30 tablet 11  . predniSONE (DELTASONE) 1 MG tablet Take 1 tablet by mouth daily. Takes with Prednisone 5 mg.  0  . predniSONE (DELTASONE) 5 MG tablet  Take 5 mg by mouth daily.    . SUMAtriptan (IMITREX) 100 MG tablet Take one tab at onset of headache. May repeat in 2 hours if headache persists or recurs. 10 tablet 11  . temazepam (RESTORIL) 15 MG capsule Take 15 mg by mouth daily.  0  . topiramate (TOPAMAX) 50 MG tablet Take 1 tablet (50 mg total) by mouth  2 (two) times daily. 60 tablet 11  . zolpidem (AMBIEN) 10 MG tablet Take 1 tablet by mouth at bedtime as needed.  0  . nortriptyline (PAMELOR) 25 MG capsule Take 1 capsule (25 mg total) by mouth at bedtime. 30 capsule 12  . ondansetron (ZOFRAN ODT) 4 MG disintegrating tablet Take 1 tablet (4 mg total) by mouth every 8 (eight) hours as needed for nausea or vomiting. 20 tablet 11   No current facility-administered medications for this visit.     Allergies as of 10/19/2015 - Review Complete 10/19/2015  Allergen Reaction Noted  . Myrbetriq [mirabegron] Itching 10/21/2014    Vitals: BP 113/66   Pulse 60   Ht 5\' 4"  (1.626 m)   Wt 151 lb 9.6 oz (68.8 kg)   BMI 26.02 kg/m  Last Weight:  Wt Readings from Last 1 Encounters:  10/19/15 151 lb 9.6 oz (68.8 kg)   Last Height:   Ht Readings from Last 1 Encounters:  10/19/15 5\' 4"  (1.626 m)   .Physical exam: Exam: Gen: NAD, conversant, well nourised, obese, well groomed                     Eyes: Conjunctivae clear without exudates or hemorrhage  Neuro: Detailed Neurologic Exam  Speech:    Speech is normal; fluent and spontaneous with normal comprehension.  Cognition:    The patient is oriented to person, place, and time;     recent and remote memory intact;     language fluent;     normal attention, concentration,     fund of knowledge Cranial Nerves:    The pupils are equal, round, and reactive to light. The fundi are normal and spontaneous venous pulsations are present. Visual fields are full to finger confrontation. Extraocular movements are intact. Trigeminal sensation is intact and the muscles of mastication are normal. The  face is symmetric. The palate elevates in the midline. Hearing intact. Voice is normal. Shoulder shrug is normal. The tongue has normal motion without fasciculations.     Assessment/Plan:  50 year old with migraines. Side effects to Topamax. Will switch to nortriptyline.  Qtc 420. Start Nortriptyline daily for preventative, continue Imitrex for acute management and take with zofran. Discussed side effects as per patient instructions.   Discussed the following: To prevent or relieve headaches, try the following: Cool Compress. Lie down and place a cool compress on your head.  Avoid headache triggers. If certain foods or odors seem to have triggered your migraines in the past, avoid them. A headache diary might help you identify triggers.  Include physical activity in your daily routine. Try a daily walk or other moderate aerobic exercise.  Manage stress. Find healthy ways to cope with the stressors, such as delegating tasks on your to-do list.  Practice relaxation techniques. Try deep breathing, yoga, massage and visualization.  Eat regularly. Eating regularly scheduled meals and maintaining a healthy diet might help prevent headaches. Also, drink plenty of fluids.  Follow a regular sleep schedule. Sleep deprivation might contribute to headaches Consider biofeedback. With this mind-body technique, you learn to control certain bodily functions - such as muscle tension, heart rate and blood pressure - to prevent headaches or reduce headache pain.    Proceed to emergency room if you experience new or worsening symptoms or symptoms do not resolve, if you have new neurologic symptoms or if headache is severe, or for any concerning symptom.    Sarina Ill, MD  [facility]  [address] [place], [place] [address]  Phone [phone] Fax [phone]  A total of 30 minutes was spent face-to-face with this patient. Over half this time was spent on counseling  patient on the migraine diagnosis and different diagnostic and therapeutic options available.

## 2015-10-19 NOTE — Progress Notes (Signed)
Gave Toradol 60mg/2ml IM in left deltoid. Cleaned with alcohol wipe prior to injection. Band-aid applied. Pt tolerated well.   

## 2015-11-02 ENCOUNTER — Ambulatory Visit: Payer: Medicare Other | Admitting: Neurology

## 2015-11-06 ENCOUNTER — Other Ambulatory Visit: Payer: Self-pay | Admitting: Neurology

## 2015-11-09 ENCOUNTER — Other Ambulatory Visit: Payer: Self-pay | Admitting: Neurology

## 2015-11-09 ENCOUNTER — Encounter: Payer: Self-pay | Admitting: Neurology

## 2015-11-09 MED ORDER — NORTRIPTYLINE HCL 10 MG PO CAPS: 10.0000 mg | ORAL_CAPSULE | Freq: Every day | ORAL | 12 refills | Status: DC

## 2015-11-20 ENCOUNTER — Other Ambulatory Visit: Payer: Self-pay | Admitting: Neurology

## 2015-11-20 DIAGNOSIS — G43109 Migraine with aura, not intractable, without status migrainosus: Secondary | ICD-10-CM

## 2015-11-20 DIAGNOSIS — G609 Hereditary and idiopathic neuropathy, unspecified: Secondary | ICD-10-CM

## 2015-11-20 DIAGNOSIS — B0229 Other postherpetic nervous system involvement: Secondary | ICD-10-CM

## 2015-12-08 ENCOUNTER — Encounter: Payer: Self-pay | Admitting: Neurology

## 2015-12-08 NOTE — Telephone Encounter (Signed)
Patient is returning your call.  

## 2015-12-08 NOTE — Telephone Encounter (Signed)
Called and LVM on home and cell for patient to call office. Advised Dr Jaynee Eagles got her message and wanted me to call her. Gave [facility] phone number.

## 2015-12-08 NOTE — Telephone Encounter (Signed)
Dr Jaynee Eagles- please advise  Called patient back. She stated her head feels a lot better today. She still has a slight headache. She is taking the nortriptyline 10mg  at night since she could not tolerate the 25 mg at night due to dry mouth. She feels the imitrex helps, but not completely. She is interested in trying a different rescue medication if possible. Advised I will speak to Dr Jaynee Eagles and call her back tomorrow at the latest to let her know.  Advised I spoke to [place] in intrafusion and she said she can come first thing in the morning around 8am if she ends up having a bad migraine again. I told pt to call first thing in the morning and let me know if she is coming. I went over medications given for migraine infusion and advised should would need a driver because it will make her drowsy. She verbalized understanding.

## 2015-12-10 ENCOUNTER — Other Ambulatory Visit: Payer: Self-pay | Admitting: Neurology

## 2015-12-10 ENCOUNTER — Other Ambulatory Visit: Payer: Self-pay

## 2015-12-10 ENCOUNTER — Encounter: Payer: Self-pay | Admitting: Neurology

## 2015-12-10 DIAGNOSIS — H539 Unspecified visual disturbance: Secondary | ICD-10-CM

## 2015-12-10 DIAGNOSIS — R519 Headache, unspecified: Secondary | ICD-10-CM

## 2015-12-10 DIAGNOSIS — R51 Headache: Principal | ICD-10-CM

## 2015-12-10 MED ORDER — MAGNESIUM OXIDE 400 (241.3 MG) MG PO TABS: 1.0000 | ORAL_TABLET | Freq: Every day | ORAL | 5 refills | Status: DC

## 2015-12-10 MED ORDER — DIVALPROEX SODIUM ER 500 MG PO TB24: 500.0000 mg | ORAL_TABLET | Freq: Every day | ORAL | 12 refills | Status: DC

## 2015-12-10 NOTE — Progress Notes (Signed)
Called patient. Discussed repeat imaging given her persistent headaches. She declines. She had side effects to Topamax and Nortriptyline. MR brain due to worsening refractory headaches, she has blurry vision, intractable. Will start depakote. Will start Depakote. Discussed the most common side effects of Depakote including serious reactions which can include hepatotoxicity, pancreatitis, hyponatremia, pancytopenia, thrombocytopenia. She is to stop for any concerning symptoms especially rash, suicidality, psychosis and hallucinations., And reactions include headache, nausea vomiting, somnolence, thrombocytopenia, dyspepsia, dizziness, diarrhea, abdominal pain, tremor, alopecia, weight changes, appetite changes, constipation and other side effects. Please can stop for anything concerning.

## 2015-12-15 ENCOUNTER — Encounter: Payer: Self-pay | Admitting: Neurology

## 2015-12-22 ENCOUNTER — Encounter: Payer: Self-pay | Admitting: Neurology

## 2015-12-23 NOTE — Telephone Encounter (Addendum)
Called pt. She stated stomach pain has improved today. It went away last night. She did not take dose of depakote last night.   Per Dr Jaynee Eagles, I advised patient to f/u with PCP to r/o other causes of stomach pain. I spoke with Dr Jaynee Eagles while patient on phone and advised patient to continue depakote per Dr Jaynee Eagles. If she has stomach pains again, she should stop medication per Dr Jaynee Eagles. She verbalized understanding.   She also stated she has not heard about scheduling her MRI that was ordered on 9/15. Advised I will check with Julian Reil, our scheduling coordinator to check on the status of this. She verbalized understanding.

## 2015-12-28 ENCOUNTER — Encounter: Payer: Self-pay | Admitting: Neurology

## 2015-12-30 DIAGNOSIS — R1013 Epigastric pain: Secondary | ICD-10-CM | POA: Diagnosis not present

## 2015-12-30 DIAGNOSIS — R079 Chest pain, unspecified: Secondary | ICD-10-CM | POA: Diagnosis not present

## 2015-12-30 DIAGNOSIS — G43909 Migraine, unspecified, not intractable, without status migrainosus: Secondary | ICD-10-CM | POA: Diagnosis not present

## 2015-12-30 DIAGNOSIS — Z23 Encounter for immunization: Secondary | ICD-10-CM | POA: Diagnosis not present

## 2016-01-05 ENCOUNTER — Ambulatory Visit
Admission: RE | Admit: 2016-01-05 | Discharge: 2016-01-05 | Disposition: A | Discharge status: Home / Self Care | Payer: 59 | Source: Ambulatory Visit | Attending: Neurology | Admitting: Neurology

## 2016-01-05 DIAGNOSIS — H539 Unspecified visual disturbance: Secondary | ICD-10-CM

## 2016-01-05 DIAGNOSIS — R51 Headache: Secondary | ICD-10-CM | POA: Diagnosis not present

## 2016-01-05 DIAGNOSIS — R519 Headache, unspecified: Secondary | ICD-10-CM

## 2016-01-05 MED ORDER — GADOBENATE DIMEGLUMINE 529 MG/ML IV SOLN
15.0000 mL | Freq: Once | INTRAVENOUS | Status: AC | PRN
  Administered 2016-01-05: 14 mL via INTRAVENOUS

## 2016-01-06 ENCOUNTER — Other Ambulatory Visit: Payer: Self-pay | Admitting: Neurology

## 2016-01-06 DIAGNOSIS — J341 Cyst and mucocele of nose and nasal sinus: Secondary | ICD-10-CM

## 2016-01-06 DIAGNOSIS — R21 Rash and other nonspecific skin eruption: Secondary | ICD-10-CM | POA: Diagnosis not present

## 2016-01-06 DIAGNOSIS — R609 Edema, unspecified: Secondary | ICD-10-CM | POA: Diagnosis not present

## 2016-01-07 ENCOUNTER — Telehealth: Payer: Self-pay | Admitting: Cardiovascular Disease

## 2016-01-07 ENCOUNTER — Ambulatory Visit: Payer: 59 | Admitting: Cardiovascular Disease

## 2016-01-10 ENCOUNTER — Ambulatory Visit: Payer: Medicare Other | Admitting: Neurology

## 2016-01-10 ENCOUNTER — Other Ambulatory Visit: Payer: Self-pay | Admitting: Internal Medicine

## 2016-01-10 DIAGNOSIS — R609 Edema, unspecified: Secondary | ICD-10-CM

## 2016-01-11 ENCOUNTER — Other Ambulatory Visit: Payer: Self-pay | Admitting: Otolaryngology

## 2016-01-11 DIAGNOSIS — J341 Cyst and mucocele of nose and nasal sinus: Secondary | ICD-10-CM

## 2016-01-14 ENCOUNTER — Ambulatory Visit
Admission: RE | Admit: 2016-01-14 | Discharge: 2016-01-14 | Disposition: A | Discharge status: Home / Self Care | Payer: 59 | Source: Ambulatory Visit | Attending: Otolaryngology | Admitting: Otolaryngology

## 2016-01-14 DIAGNOSIS — R51 Headache: Secondary | ICD-10-CM | POA: Diagnosis not present

## 2016-01-14 DIAGNOSIS — J341 Cyst and mucocele of nose and nasal sinus: Secondary | ICD-10-CM

## 2016-01-18 ENCOUNTER — Ambulatory Visit
Admission: RE | Admit: 2016-01-18 | Discharge: 2016-01-18 | Disposition: A | Discharge status: Home / Self Care | Payer: 59 | Source: Ambulatory Visit | Attending: Internal Medicine | Admitting: Internal Medicine

## 2016-01-18 DIAGNOSIS — J341 Cyst and mucocele of nose and nasal sinus: Secondary | ICD-10-CM | POA: Diagnosis not present

## 2016-01-18 DIAGNOSIS — J322 Chronic ethmoidal sinusitis: Secondary | ICD-10-CM | POA: Diagnosis not present

## 2016-01-18 DIAGNOSIS — R609 Edema, unspecified: Secondary | ICD-10-CM

## 2016-01-18 DIAGNOSIS — R600 Localized edema: Secondary | ICD-10-CM | POA: Diagnosis not present

## 2016-01-18 DIAGNOSIS — J321 Chronic frontal sinusitis: Secondary | ICD-10-CM | POA: Diagnosis not present

## 2016-01-18 DIAGNOSIS — J343 Hypertrophy of nasal turbinates: Secondary | ICD-10-CM | POA: Diagnosis not present

## 2016-01-19 ENCOUNTER — Encounter: Payer: Self-pay | Admitting: Cardiovascular Disease

## 2016-01-19 ENCOUNTER — Ambulatory Visit (INDEPENDENT_AMBULATORY_CARE_PROVIDER_SITE_OTHER): Payer: 59 | Admitting: Cardiovascular Disease

## 2016-01-19 VITALS — BP 118/76 | HR 46 | Ht 64.0 in

## 2016-01-19 DIAGNOSIS — I10 Essential (primary) hypertension: Secondary | ICD-10-CM | POA: Diagnosis not present

## 2016-01-19 DIAGNOSIS — R0789 Other chest pain: Secondary | ICD-10-CM

## 2016-01-19 NOTE — Progress Notes (Signed)
01/19/2016 Marie Peterson   March 09, 1966  [id number]  Primary Physician Marie Gravel, MD Primary Cardiologist: Marie Harp MD Marie Peterson  HPI:  Ms. Marie Peterson is a 50 year old mildly overweight married an American female mother of 2 daughters one of them goes to the [employer], and the other graduated from [employer] and is now getting her MBA at state,  who is formally a patient of Dr. Lowella Peterson. She currently is out on disability because of SLE. I last saw her in the office 10/21/14.Her factors include treated hypertension. Her father had bypass surgery at age 37. She's never had a heart attack or stroke. She denies chest pain or shortness of breath. She does have GERD. Since I saw her in the office one year ago she continues to have occasional atypical chest pain. A routine GXT performed 11/12/14 was entirely normal. Since I saw her several months ago she does. Occasional atypical chest pain which sounds more gastro-intestinal in nature.   Current Outpatient Prescriptions  Medication Sig Dispense Refill  . alendronate (FOSAMAX) 70 MG tablet Take 1 tablet (70 mg total) by mouth every 7 (seven) days. Take with a full glass of water on an empty stomach. 4 tablet 11  . amLODipine (NORVASC) 5 MG tablet Take 5 mg by mouth daily.    [name] Kitchen aspirin 81 MG tablet Take 81 mg by mouth daily.    . benazepril (LOTENSIN) 40 MG tablet Take 1 tablet by mouth daily.  1  . Cholecalciferol (VITAMIN D PO) Take 1,000 Int'l Units by mouth daily.     . diclofenac sodium (VOLTAREN) 1 % GEL Apply topically.    . divalproex (DEPAKOTE ER) 500 MG 24 hr tablet Take 1 tablet (500 mg total) by mouth at bedtime. 30 tablet 12  . FERREX 150 150 MG capsule Take 1 tablet by mouth daily.  1  . folic acid (FOLVITE) A999333 MCG tablet Take 400 mcg by mouth daily.    . furosemide (LASIX) 40 MG tablet Take 1 tablet by mouth 2 (two) times daily. TAKE 1 AND 1/2 TABLET IN THE MORNING AND 1 TABLET AT NIGHT.  0  .  HYDROcodone-acetaminophen (NORCO) 10-325 MG per tablet Take 1 tablet by mouth every 6 (six) hours as needed for pain. 1-3 Tabs per day as needed    . hydroxychloroquine (PLAQUENIL) 200 MG tablet Take 2 tablets by mouth daily.  0  . L-Methylfolate-B6-B12 (METANX PO) Take 1 tablet by mouth daily.    [name] Kitchen lidocaine (LIDODERM) 5 % Place 3 patches onto the skin daily. Remove & Discard patch within 12 hours or as directed by MD 90 patch 6  . LYRICA 150 MG capsule take 1 capsule by mouth twice a day 60 capsule 5  . magnesium oxide (MAG-OX) 400 (241.3 Mg) MG tablet Take 1 tablet (400 mg total) by mouth daily. 30 tablet 5  . Multiple Vitamin (MULTIVITAMIN) tablet Take 1 tablet by mouth daily.    . nabumetone (RELAFEN) 500 MG tablet Take 500 mg by mouth 2 (two) times daily as needed.  0  . Omega-3 Fatty Acids (FISH OIL) 1000 MG CAPS Take 2 capsules by mouth daily.    . ondansetron (ZOFRAN ODT) 4 MG disintegrating tablet Take 1 tablet (4 mg total) by mouth every 8 (eight) hours as needed for nausea or vomiting. 20 tablet 11  . oxybutynin (DITROPAN-XL) 10 MG 24 hr tablet Take 1 tablet by mouth daily.  0  . pantoprazole (PROTONIX) 40  MG tablet Take 1 tablet (40 mg total) by mouth daily. 30 tablet 11  . potassium chloride SA (K-DUR,KLOR-CON) 20 MEQ tablet take 1 tablet by mouth once daily 30 tablet 11  . predniSONE (DELTASONE) 1 MG tablet Take 1 tablet by mouth daily. Takes with Prednisone 5 mg.  0  . predniSONE (DELTASONE) 5 MG tablet Take 5 mg by mouth daily.    . SUMAtriptan (IMITREX) 100 MG tablet TAKE 1 TABLET BY MOUTH AT ONSET OF HEADACHE MAY REPEAT IN 2 HOURS IF HEADACHE CONTINUES 10 tablet 11  . temazepam (RESTORIL) 15 MG capsule Take 15 mg by mouth daily.  0  . zolpidem (AMBIEN) 10 MG tablet Take 1 tablet by mouth at bedtime as needed.  0   No current facility-administered medications for this visit.     Allergies  Allergen Reactions  . Myrbetriq [Mirabegron] Itching    Social History   Social  History  . Marital status: Married    Spouse name: Marie Peterson  . Number of children: 2  . Years of education: College   Occupational History  .  Disabled    disability   Social History Main Topics  . Smoking status: Never Smoker  . Smokeless tobacco: Never Used  . Alcohol use No  . Drug use: No  . Sexual activity: Not Currently    Birth control/ protection: Surgical, Post-menopausal     Comment: Tubal lig-1st intercourse 50 yo-Fewer than 5 partners   Other Topics Concern  . Not on file   Social History Narrative   Patient lives at home with her family.   Caffeine Use: 2 cups of coffee daily.      Review of Systems: General: negative for chills, fever, night sweats or weight changes.  Cardiovascular: negative for chest pain, dyspnea on exertion, edema, orthopnea, palpitations, paroxysmal nocturnal dyspnea or shortness of breath Dermatological: negative for rash Respiratory: negative for cough or wheezing Urologic: negative for hematuria Abdominal: negative for nausea, vomiting, diarrhea, bright red blood per rectum, melena, or hematemesis Neurologic: negative for visual changes, syncope, or dizziness All other systems reviewed and are otherwise negative except as noted above.    Blood pressure 118/76, pulse (!) 46, height 5\' 4"  (1.626 m).  General appearance: alert and no distress Neck: no adenopathy, no carotid bruit, no JVD, supple, symmetrical, trachea midline and thyroid not enlarged, symmetric, no tenderness/mass/nodules Lungs: clear to auscultation bilaterally Heart: regular rate and rhythm, S1, S2 normal, no murmur, click, rub or gallop Extremities: extremities normal, atraumatic, no cyanosis or edema  EKG sinus bradycardia at 46 ST or T-wave changes. I personally reviewed this EKG  ASSESSMENT AND PLAN:   Essential hypertension History of hypertension blood pressure measures at 118/76. She is on Lotensin. Continue current meds at current dosing  Atypical chest  pain History of atypical chest pain sounds more GI in nature. She did have a negative GXT performed 11/12/14.      Marie Harp MD FACP,FACC,FAHA, [facility] 01/19/2016 3:26 PM

## 2016-01-19 NOTE — Assessment & Plan Note (Signed)
History of atypical chest pain sounds more GI in nature. She did have a negative GXT performed 11/12/14.

## 2016-01-19 NOTE — Patient Instructions (Signed)
Medication Instructions:  Continue current medications.   Labwork: Labwork will be requested from your primary care physician.   Follow-Up: Your physician wants you to follow-up in: 12 MONTHS WITH DR BERRY.  You will receive a reminder letter in the mail two months in advance. If you don't receive a letter, please call our office to schedule the follow-up appointment.   If you need a refill on your cardiac medications before your next appointment, please call your pharmacy.   

## 2016-01-19 NOTE — Assessment & Plan Note (Signed)
History of hypertension blood pressure measures at 118/76. She is on Lotensin. Continue current meds at current dosing

## 2016-01-24 ENCOUNTER — Telehealth: Payer: Self-pay | Admitting: Neurology

## 2016-01-24 NOTE — Telephone Encounter (Signed)
I spoke to patient, she is having sinus surgery next week. I advised we should postpone her appointment until at least several weeks after surgery. Her sinus condition may be causing her headaches and we need to have that treated first.  Terrence [facility], please cancel and block her appointment time tomorrow  Angie, please do not send her a no-show letter.   Thank you!

## 2016-01-25 ENCOUNTER — Ambulatory Visit: Payer: Medicare Other | Admitting: Neurology

## 2016-01-25 NOTE — Telephone Encounter (Signed)
Cancelled pt appt per AA, MD request. Blocked appt time.

## 2016-01-26 HISTORY — PX: NASAL SINUS SURGERY: SHX719

## 2016-01-26 NOTE — Telephone Encounter (Signed)
Closed encounter °

## 2016-02-04 ENCOUNTER — Other Ambulatory Visit: Payer: Self-pay | Admitting: Otolaryngology

## 2016-02-04 DIAGNOSIS — J322 Chronic ethmoidal sinusitis: Secondary | ICD-10-CM | POA: Diagnosis not present

## 2016-02-04 DIAGNOSIS — J321 Chronic frontal sinusitis: Secondary | ICD-10-CM | POA: Diagnosis not present

## 2016-02-04 DIAGNOSIS — J343 Hypertrophy of nasal turbinates: Secondary | ICD-10-CM | POA: Diagnosis not present

## 2016-03-16 ENCOUNTER — Telehealth: Payer: Self-pay | Admitting: *Deleted

## 2016-03-16 NOTE — Telephone Encounter (Signed)
Error

## 2016-03-22 ENCOUNTER — Other Ambulatory Visit: Payer: Self-pay | Admitting: Internal Medicine

## 2016-03-22 DIAGNOSIS — Z1231 Encounter for screening mammogram for malignant neoplasm of breast: Secondary | ICD-10-CM

## 2016-03-29 DIAGNOSIS — D125 Benign neoplasm of sigmoid colon: Secondary | ICD-10-CM | POA: Diagnosis not present

## 2016-03-29 DIAGNOSIS — R0789 Other chest pain: Secondary | ICD-10-CM | POA: Diagnosis not present

## 2016-03-29 DIAGNOSIS — K2970 Gastritis, unspecified, without bleeding: Secondary | ICD-10-CM | POA: Diagnosis not present

## 2016-03-29 DIAGNOSIS — Z1211 Encounter for screening for malignant neoplasm of colon: Secondary | ICD-10-CM | POA: Diagnosis not present

## 2016-03-29 DIAGNOSIS — K219 Gastro-esophageal reflux disease without esophagitis: Secondary | ICD-10-CM | POA: Diagnosis not present

## 2016-03-29 DIAGNOSIS — K635 Polyp of colon: Secondary | ICD-10-CM | POA: Diagnosis not present

## 2016-03-30 DIAGNOSIS — Z79899 Other long term (current) drug therapy: Secondary | ICD-10-CM | POA: Diagnosis not present

## 2016-03-30 DIAGNOSIS — M329 Systemic lupus erythematosus, unspecified: Secondary | ICD-10-CM | POA: Diagnosis not present

## 2016-03-30 DIAGNOSIS — M25569 Pain in unspecified knee: Secondary | ICD-10-CM | POA: Diagnosis not present

## 2016-03-30 DIAGNOSIS — M810 Age-related osteoporosis without current pathological fracture: Secondary | ICD-10-CM | POA: Diagnosis not present

## 2016-04-04 DIAGNOSIS — M329 Systemic lupus erythematosus, unspecified: Secondary | ICD-10-CM | POA: Diagnosis not present

## 2016-04-04 DIAGNOSIS — Z79899 Other long term (current) drug therapy: Secondary | ICD-10-CM | POA: Diagnosis not present

## 2016-04-04 DIAGNOSIS — H1013 Acute atopic conjunctivitis, bilateral: Secondary | ICD-10-CM | POA: Diagnosis not present

## 2016-04-06 ENCOUNTER — Ambulatory Visit: Payer: 59

## 2016-04-18 DIAGNOSIS — K5904 Chronic idiopathic constipation: Secondary | ICD-10-CM | POA: Diagnosis not present

## 2016-04-18 DIAGNOSIS — D509 Iron deficiency anemia, unspecified: Secondary | ICD-10-CM | POA: Diagnosis not present

## 2016-04-18 DIAGNOSIS — K219 Gastro-esophageal reflux disease without esophagitis: Secondary | ICD-10-CM | POA: Diagnosis not present

## 2016-04-22 ENCOUNTER — Other Ambulatory Visit: Payer: Self-pay | Admitting: Cardiovascular Disease

## 2016-05-02 ENCOUNTER — Other Ambulatory Visit: Payer: Self-pay | Admitting: Cardiovascular Disease

## 2016-05-02 DIAGNOSIS — M329 Systemic lupus erythematosus, unspecified: Secondary | ICD-10-CM | POA: Diagnosis not present

## 2016-05-02 DIAGNOSIS — Z79899 Other long term (current) drug therapy: Secondary | ICD-10-CM | POA: Diagnosis not present

## 2016-05-04 ENCOUNTER — Ambulatory Visit: Payer: 59

## 2016-05-04 ENCOUNTER — Other Ambulatory Visit: Payer: Self-pay | Admitting: Neurology

## 2016-05-08 ENCOUNTER — Encounter: Payer: Self-pay | Admitting: Neurology

## 2016-05-08 ENCOUNTER — Other Ambulatory Visit: Payer: Self-pay | Admitting: Neurology

## 2016-05-08 ENCOUNTER — Ambulatory Visit (INDEPENDENT_AMBULATORY_CARE_PROVIDER_SITE_OTHER): Payer: 59 | Admitting: Neurology

## 2016-05-08 VITALS — BP 109/69 | HR 63 | Ht 64.0 in | Wt 164.0 lb

## 2016-05-08 DIAGNOSIS — G43009 Migraine without aura, not intractable, without status migrainosus: Secondary | ICD-10-CM

## 2016-05-08 MED ORDER — ELETRIPTAN HYDROBROMIDE 40 MG PO TABS: 40.0000 mg | ORAL_TABLET | ORAL | 12 refills | Status: DC | PRN

## 2016-05-08 NOTE — Patient Instructions (Signed)
Remember to drink plenty of fluid, eat healthy meals and do not skip any meals. Try to eat protein with a every meal and eat a healthy snack such as fruit or nuts in between meals. Try to keep a regular sleep-wake schedule and try to exercise daily, particularly in the form of walking, 20-30 minutes a day, if you can.   As far as your medications are concerned, I would like to suggest: Relpax at onset of headache. May repeat once in 2 hours. Or you can try the [place].  I would like to see you back in XXXXX, sooner if we need to. Please call us with any interim questions, concerns, problems, updates or refill requests.   Please also call us for any test results so we can go over those with you on the phone.  My clinical assistant and will answer any of your questions and relay your messages to me and also relay most of my messages to you.   Our phone number is [phone]. We also have an after hours call service for urgent matters and there is a physician on-call for urgent questions. For any emergencies you know to call 911 or go to the nearest emergency room

## 2016-05-08 NOTE — Progress Notes (Signed)
[id number]    Provider:  Dr Jaynee Eagles Referring Provider: [name], MD Primary Care Physician:  [name], MD  CC:  Migraines   Interval history 05/08/2016: Patient is here for follow up on migraines. She has a PMHx of lupus with glomerulonephritis, osteoarthritis, shingles. She has been seen here in the office for small-fiber neuropathy as well as migraines.She could not tolerate 25mg  of nortriptyline at night and was placed on 10mg  at night. She did not tolerate the Topamax. Depakote with side effects as well. She is using triptan and zofran for migraines. She is on Lyrica for post-herpetic neuralgia. She had recent sinus surgery with Dr. Minna Merritts. She still has migraines but they are improved. The headaches can linger on and the imitrex helps but doesn;t take it away, zofran helps with the nausea. The migraines progress over an hour to maxumum severity, discussed nose sprays or pwders or injectable but prefers pills.   HPI:  Marie Peterson is a 51 y.o. female here as a referral from Dr. Maudie Mercury for continued migraines. Migraines started worsening about 2 weeks ago. Before this she was very well controlled. She had migraines every day. She had side effects to the Topamax, with increase started getting double vision, she went back to 50mg  bid. She feels she has memory loss with the Topamax. She has insomnia, she takes [place] every night, she still has problems. Migraines are on the left side. Pounding in her head. They last most of the day. Daily. +light sensitivity, +sound sensitivity. +nausea. No vomiting. They can be up to 10/10, last week it was awful 10/10 usually 6/10 on average of pain. Imitrex usually works. Advised to take with a zofran. Discussed triggers, discussed other medication possibilities.    Last visit: She has a PMHx of lupus with glomerulonephritis, osteoarthritis, shingles. She has been seen here in the office for small-fiber neuropathy as well as  migraines. She is on the Topamax. She gets migraines twice a month. Migraines are more on the right. She has nausea, light sensitivity. No aura. They can last all day. She is in bed most of the day. She takes the imitrex at the onset and it helps. No side effects from the topamax and imitrex. She uses the Lidoderm patches for her post herpetic neuralgia but it was $75. It gets worse in the winter time, now it is ok. Pain is better in the summer. Lyrica helped a lot. She is having pains in her feet at the bottom and it wakes her up. She has sharp pains in the heels. The pain is when she walks on her heel. And is tender at night in the heel with sharp shooting pains in the heel. Pain right >left heel.   Medications tried for migraines: Imitrex, Topamax (50 qam and 100mg  qpm, had side effects of memory loss and blurry vision),imitrex   Review of Systems: Patient complains of symptoms per HPI as well as the following symptoms: light sensitivity, blurred vision, joint pain, joint swelling, aching muscles, muscle cramps, rash, dizziness, headache. Pertinent negatives per HPI. All others negative.   Social History   Social History  . Marital status: Married    Spouse name: [name]  . Number of children: 2  . Years of education: College   Occupational History  .  Disabled    disability   Social History Main Topics  . Smoking status: Never Smoker  . Smokeless tobacco: Never Used  . Alcohol use No  . Drug  use: No  . Sexual activity: Not Currently    Birth control/ protection: Surgical, Post-menopausal     Comment: Tubal lig-1st intercourse 51 yo-Fewer than 5 partners   Other Topics Concern  . Not on file   Social History Narrative   Patient lives at home with her family.   Caffeine Use: 2 cups of coffee daily.    Right-handed    Family History  Problem Relation Age of Onset  . Hypertension Mother   . Asthma Mother   . Diabetes Father   . Hypertension Father   . Heart disease  Father     Past Medical History:  Diagnosis Date  . Arthritis   . Atrophic vaginitis   . Atypical chest pain    thought to be GERD; myoview 05/17/07-no significant ischemia; echo 07/20/11- normal EF=>55%  . Baker's cyst 03/28/12   venous doppler 03/28/12=no thrombus; baker's cyst at left popliteal fossa  . Chest pain   . CIN I (cervical intraepithelial neoplasia I) 1990  . Fibroid   . Hypertension   . Lupus nephritis ([place])   . Migraines   . Neuropathy ([place])   . Osteopenia 08/2015   T score -2.3 FRAX 3.2%/0.3%  . Premature menopause age 57   following chemotherapy for Lupus  . Shingles     Past Surgical History:  Procedure Laterality Date  . CESAREAN SECTION    . COLPOSCOPY    . HYSTEROSCOPY     Myomectomy  . MYOMECTOMY     Hysteroscopic myomectomy  . NASAL SINUS SURGERY  01/2016  . TUBAL LIGATION      Current Outpatient Prescriptions  Medication Sig Dispense Refill  . alendronate (FOSAMAX) 70 MG tablet Take 1 tablet (70 mg total) by mouth every 7 (seven) days. Take with a full glass of water on an empty stomach. 4 tablet 11  . amLODipine (NORVASC) 5 MG tablet Take 5 mg by mouth daily.    [name] Kitchen aspirin 81 MG tablet Take 81 mg by mouth daily.    . BELSOMRA 10 MG TABS take 1 tablet by mouth at bedtime if needed for insomnia  0  . benazepril (LOTENSIN) 40 MG tablet Take 1 tablet by mouth daily.  1  . Cholecalciferol (VITAMIN D PO) Take 1,000 Int'l Units by mouth daily.     . diclofenac sodium (VOLTAREN) 1 % GEL Apply topically.    [name] Kitchen FERREX 150 150 MG capsule Take 1 tablet by mouth daily.  1  . folic acid (FOLVITE) A999333 MCG tablet Take 400 mcg by mouth daily.    . furosemide (LASIX) 40 MG tablet Take 1 tablet by mouth 2 (two) times daily. TAKE 1 AND 1/2 TABLET IN THE MORNING AND 1 TABLET AT NIGHT.  0  . HYDROcodone-acetaminophen (NORCO) 10-325 MG per tablet Take 1 tablet by mouth every 6 (six) hours as needed for pain. 1-3 Tabs per day as needed    . hydroxychloroquine (PLAQUENIL)  200 MG tablet Take 2 tablets by mouth daily.  0  . L-Methylfolate-B6-B12 (METANX PO) Take 1 tablet by mouth daily.    [name] Kitchen lidocaine (LIDODERM) 5 % Place 3 patches onto the skin daily. Remove & Discard patch within 12 hours or as directed by MD 90 patch 6  . LYRICA 150 MG capsule take 1 capsule by mouth twice a day 60 capsule 5  . magnesium oxide (MAG-OX) 400 (241.3 Mg) MG tablet take 1 tablet by mouth once daily 30 tablet 9  . Multiple Vitamin (MULTIVITAMIN) tablet  Take 1 tablet by mouth daily.    . nabumetone (RELAFEN) 500 MG tablet Take 500 mg by mouth 2 (two) times daily as needed.  0  . Omega-3 Fatty Acids (FISH OIL) 1000 MG CAPS Take 2 capsules by mouth daily.    [name] Kitchen omeprazole (PRILOSEC) 40 MG capsule Take 40 mg by mouth daily.  1  . ondansetron (ZOFRAN ODT) 4 MG disintegrating tablet Take 1 tablet (4 mg total) by mouth every 8 (eight) hours as needed for nausea or vomiting. 20 tablet 11  . oxybutynin (DITROPAN-XL) 10 MG 24 hr tablet Take 1 tablet by mouth daily.  0  . potassium chloride SA (K-DUR,KLOR-CON) 20 MEQ tablet take 1 tablet by mouth once daily 30 tablet 8  . predniSONE (DELTASONE) 5 MG tablet Take 5 mg by mouth daily.    . SUMAtriptan (IMITREX) 100 MG tablet TAKE 1 TABLET BY MOUTH AT ONSET OF HEADACHE MAY REPEAT IN 2 HOURS IF HEADACHE CONTINUES 10 tablet 11   No current facility-administered medications for this visit.     Allergies as of 05/08/2016 - Review Complete 05/08/2016  Allergen Reaction Noted  . Myrbetriq [mirabegron] Itching 10/21/2014    Vitals: BP 109/69   Pulse 63   Ht 5\' 4"  (1.626 m)   Wt 164 lb (74.4 kg)   BMI 28.15 kg/m  Last Weight:  Wt Readings from Last 1 Encounters:  05/08/16 164 lb (74.4 kg)   Last Height:   Ht Readings from Last 1 Encounters:  05/08/16 5\' 4"  (1.626 m)     .Physical exam: Exam: Gen: NAD, conversant, well nourised, well groomed                     Eyes: Conjunctivae clear without exudates or hemorrhage  Neuro: Detailed  Neurologic Exam  Speech:    Speech is normal; fluent and spontaneous with normal comprehension.  Cognition:    The patient is oriented to person, place, and time;     recent and remote memory intact;     language fluent;     normal attention, concentration,     fund of knowledge Cranial Nerves:    The pupils are equal, round, and reactive to light. The fundi are normal and spontaneous venous pulsations are present. Visual fields are full to finger confrontation. Extraocular movements are intact. Trigeminal sensation is intact and the muscles of mastication are normal. The face is symmetric. The palate elevates in the midline. Hearing intact. Voice is normal. Shoulder shrug is normal. The tongue has normal motion without fasciculations.     Assessment/Plan:  51 year old with migraines. Side effects to Topamax, depakote, nortriptyline. Fortunately her migraine frequency is reduced after sinus surgery with Dr. Ernesto Rutherford. Will continue acute management only for 2 migraines a month. Will switch her to relpax.     Discussed the following: To prevent or relieve headaches, try the following: Cool Compress. Lie down and place a cool compress on your head.  Avoid headache triggers. If certain foods or odors seem to have triggered your migraines in the past, avoid them. A headache diary might help you identify triggers.  Include physical activity in your daily routine. Try a daily walk or other moderate aerobic exercise.  Manage stress. Find healthy ways to cope with the stressors, such as delegating tasks on your to-do list.  Practice relaxation techniques. Try deep breathing, yoga, massage and visualization.  Eat regularly. Eating regularly scheduled meals and maintaining a healthy diet might help prevent  headaches. Also, drink plenty of fluids.  Follow a regular sleep schedule. Sleep deprivation might contribute to headaches Consider biofeedback. With this mind-body technique, you learn to  control certain bodily functions - such as muscle tension, heart rate and blood pressure - to prevent headaches or reduce headache pain.    Proceed to emergency room if you experience new or worsening symptoms or symptoms do not resolve, if you have new neurologic symptoms or if headache is severe, or for any concerning symptom.       Sarina Ill, MD  [facility] [address] [place], [place] [address]  Phone [phone] Fax [phone]  A total of 25  minutes was spent face-to-face with this patient. Over half this time was spent on counseling patient on the migraine diagnosis and different diagnostic and therapeutic options available.

## 2016-05-09 ENCOUNTER — Other Ambulatory Visit: Payer: Self-pay | Admitting: Neurology

## 2016-05-09 MED ORDER — RIZATRIPTAN BENZOATE 10 MG PO TABS: 10.0000 mg | ORAL_TABLET | ORAL | 11 refills | Status: DC | PRN

## 2016-05-16 ENCOUNTER — Ambulatory Visit
Admission: RE | Admit: 2016-05-16 | Discharge: 2016-05-16 | Disposition: A | Discharge status: Home / Self Care | Payer: 59 | Source: Ambulatory Visit | Attending: Internal Medicine | Admitting: Internal Medicine

## 2016-05-16 DIAGNOSIS — Z1231 Encounter for screening mammogram for malignant neoplasm of breast: Secondary | ICD-10-CM | POA: Diagnosis not present

## 2016-05-26 DIAGNOSIS — M329 Systemic lupus erythematosus, unspecified: Secondary | ICD-10-CM | POA: Diagnosis not present

## 2016-05-29 DIAGNOSIS — M810 Age-related osteoporosis without current pathological fracture: Secondary | ICD-10-CM | POA: Diagnosis not present

## 2016-05-29 DIAGNOSIS — M329 Systemic lupus erythematosus, unspecified: Secondary | ICD-10-CM | POA: Diagnosis not present

## 2016-05-29 DIAGNOSIS — Z79899 Other long term (current) drug therapy: Secondary | ICD-10-CM | POA: Diagnosis not present

## 2016-05-29 DIAGNOSIS — M25569 Pain in unspecified knee: Secondary | ICD-10-CM | POA: Diagnosis not present

## 2016-06-08 ENCOUNTER — Telehealth: Payer: Self-pay

## 2016-06-08 NOTE — Telephone Encounter (Signed)
Received faxed OV note from Dr. Ernesto Rutherford. Pt had bilat ethmoidectomy, L frontal siunsotomy and turbinate reduction 02/04/16. She is doing well w/ no further HA, no drainage, no erythema. Plan to have her come back as necessary.

## 2016-06-13 DIAGNOSIS — M810 Age-related osteoporosis without current pathological fracture: Secondary | ICD-10-CM | POA: Diagnosis not present

## 2016-06-13 DIAGNOSIS — M329 Systemic lupus erythematosus, unspecified: Secondary | ICD-10-CM | POA: Diagnosis not present

## 2016-06-13 DIAGNOSIS — Z79899 Other long term (current) drug therapy: Secondary | ICD-10-CM | POA: Diagnosis not present

## 2016-06-13 DIAGNOSIS — M7989 Other specified soft tissue disorders: Secondary | ICD-10-CM | POA: Diagnosis not present

## 2016-07-03 DIAGNOSIS — M25562 Pain in left knee: Secondary | ICD-10-CM | POA: Diagnosis not present

## 2016-07-03 DIAGNOSIS — M170 Bilateral primary osteoarthritis of knee: Secondary | ICD-10-CM | POA: Diagnosis not present

## 2016-07-03 DIAGNOSIS — M2241 Chondromalacia patellae, right knee: Secondary | ICD-10-CM | POA: Diagnosis not present

## 2016-07-03 DIAGNOSIS — M25561 Pain in right knee: Secondary | ICD-10-CM | POA: Diagnosis not present

## 2016-07-03 DIAGNOSIS — M2242 Chondromalacia patellae, left knee: Secondary | ICD-10-CM | POA: Diagnosis not present

## 2016-07-07 DIAGNOSIS — G629 Polyneuropathy, unspecified: Secondary | ICD-10-CM | POA: Diagnosis not present

## 2016-07-07 DIAGNOSIS — M329 Systemic lupus erythematosus, unspecified: Secondary | ICD-10-CM | POA: Diagnosis not present

## 2016-07-07 DIAGNOSIS — I10 Essential (primary) hypertension: Secondary | ICD-10-CM | POA: Diagnosis not present

## 2016-07-07 DIAGNOSIS — Z87448 Personal history of other diseases of urinary system: Secondary | ICD-10-CM | POA: Diagnosis not present

## 2016-07-12 DIAGNOSIS — R809 Proteinuria, unspecified: Secondary | ICD-10-CM | POA: Diagnosis not present

## 2016-07-13 DIAGNOSIS — M2241 Chondromalacia patellae, right knee: Secondary | ICD-10-CM | POA: Diagnosis not present

## 2016-07-13 DIAGNOSIS — M2242 Chondromalacia patellae, left knee: Secondary | ICD-10-CM | POA: Diagnosis not present

## 2016-07-18 ENCOUNTER — Encounter: Payer: Self-pay | Admitting: Neurology

## 2016-07-18 ENCOUNTER — Telehealth: Payer: Self-pay

## 2016-07-18 DIAGNOSIS — M2242 Chondromalacia patellae, left knee: Secondary | ICD-10-CM | POA: Diagnosis not present

## 2016-07-18 DIAGNOSIS — M2241 Chondromalacia patellae, right knee: Secondary | ICD-10-CM | POA: Diagnosis not present

## 2016-07-18 NOTE — Telephone Encounter (Signed)
Received faxed consult notes from [facility]. Dr. Lorrene Reid plans to continue current BP meds, Plaquenil and prednisone taper per Dr. Dossie Der, f/u w/ rheum for lupus and neuro for peripheral neuropathy. Copy to med records to be scanned and to Dr. Jaynee Eagles for review.

## 2016-07-19 NOTE — Telephone Encounter (Signed)
[place] Enrollment Form for [employer] completed, awaiting MD signature.

## 2016-07-25 DIAGNOSIS — M2241 Chondromalacia patellae, right knee: Secondary | ICD-10-CM | POA: Diagnosis not present

## 2016-07-25 DIAGNOSIS — M2242 Chondromalacia patellae, left knee: Secondary | ICD-10-CM | POA: Diagnosis not present

## 2016-07-27 DIAGNOSIS — M25562 Pain in left knee: Secondary | ICD-10-CM | POA: Diagnosis not present

## 2016-07-27 DIAGNOSIS — M25561 Pain in right knee: Secondary | ICD-10-CM | POA: Diagnosis not present

## 2016-07-27 DIAGNOSIS — M170 Bilateral primary osteoarthritis of knee: Secondary | ICD-10-CM | POA: Diagnosis not present

## 2016-07-29 ENCOUNTER — Other Ambulatory Visit: Payer: Self-pay | Admitting: Gynecology

## 2016-08-01 DIAGNOSIS — M2241 Chondromalacia patellae, right knee: Secondary | ICD-10-CM | POA: Diagnosis not present

## 2016-08-01 DIAGNOSIS — M2242 Chondromalacia patellae, left knee: Secondary | ICD-10-CM | POA: Diagnosis not present

## 2016-08-03 DIAGNOSIS — M170 Bilateral primary osteoarthritis of knee: Secondary | ICD-10-CM | POA: Diagnosis not present

## 2016-08-04 DIAGNOSIS — M2241 Chondromalacia patellae, right knee: Secondary | ICD-10-CM | POA: Diagnosis not present

## 2016-08-04 DIAGNOSIS — M2242 Chondromalacia patellae, left knee: Secondary | ICD-10-CM | POA: Diagnosis not present

## 2016-08-10 DIAGNOSIS — M170 Bilateral primary osteoarthritis of knee: Secondary | ICD-10-CM | POA: Diagnosis not present

## 2016-08-15 DIAGNOSIS — I10 Essential (primary) hypertension: Secondary | ICD-10-CM | POA: Diagnosis not present

## 2016-08-22 DIAGNOSIS — R635 Abnormal weight gain: Secondary | ICD-10-CM | POA: Diagnosis not present

## 2016-08-22 DIAGNOSIS — M329 Systemic lupus erythematosus, unspecified: Secondary | ICD-10-CM | POA: Diagnosis not present

## 2016-08-22 DIAGNOSIS — I10 Essential (primary) hypertension: Secondary | ICD-10-CM | POA: Diagnosis not present

## 2016-08-22 DIAGNOSIS — D72820 Lymphocytosis (symptomatic): Secondary | ICD-10-CM | POA: Diagnosis not present

## 2016-08-27 ENCOUNTER — Encounter: Payer: Self-pay | Admitting: Neurology

## 2016-08-28 MED ORDER — SUMATRIPTAN SUCCINATE 11 MG/NOSEPC NA EXHP: 11.0000 mg | INHALANT_POWDER | NASAL | 11 refills | Status: DC | PRN

## 2016-08-28 NOTE — Telephone Encounter (Signed)
Spoke to Amy ([employer] rep) this morning who recommended that PA be done on CoverMyMeds. PA completed and [facility] [place] is on pt's list of covered drugs. Called [place] support and they reported that they never received enrollment form on patient. New rx e-scribed to pt's local pharmacy.

## 2016-08-28 NOTE — Addendum Note (Signed)
Addended by: Monte Fantasia on: 08/28/2016 04:43 PM   Modules accepted: Orders

## 2016-08-31 ENCOUNTER — Encounter: Payer: Self-pay | Admitting: Gynecology

## 2016-08-31 ENCOUNTER — Ambulatory Visit (INDEPENDENT_AMBULATORY_CARE_PROVIDER_SITE_OTHER): Payer: 59 | Admitting: Gynecology

## 2016-08-31 VITALS — BP 124/78 | Ht 64.0 in | Wt 173.0 lb

## 2016-08-31 DIAGNOSIS — N952 Postmenopausal atrophic vaginitis: Secondary | ICD-10-CM

## 2016-08-31 DIAGNOSIS — Z01411 Encounter for gynecological examination (general) (routine) with abnormal findings: Secondary | ICD-10-CM

## 2016-08-31 DIAGNOSIS — M8580 Other specified disorders of bone density and structure, unspecified site: Secondary | ICD-10-CM | POA: Diagnosis not present

## 2016-08-31 MED ORDER — ALENDRONATE SODIUM 70 MG PO TABS: ORAL_TABLET | ORAL | 12 refills | Status: DC

## 2016-08-31 NOTE — Patient Instructions (Signed)
Followup in one year for annual exam, sooner if any issues 

## 2016-08-31 NOTE — Addendum Note (Signed)
Addended by: Campbell Stall on: 08/31/2016 09:36 AM   Modules accepted: Orders

## 2016-08-31 NOTE — Progress Notes (Signed)
    Marie Peterson [birth year] [id number]        51 y.o.  G2P2002 for annual exam.  Doing well without complaints  Past medical history,surgical history, problem list, medications, allergies, family history and social history were all reviewed and documented as reviewed in the EPIC chart.  ROS:  Performed with pertinent positives and negatives included in the history, assessment and plan.   Additional significant findings :  None   Exam: [name] assistant Vitals:   08/31/16 0855  BP: 124/78  Weight: 173 lb (78.5 kg)  Height: 5\' 4"  (1.626 m)   Body mass index is 29.7 kg/m.  General appearance:  Normal affect, orientation and appearance. Skin: Grossly normal HEENT: Without gross lesions.  No cervical or supraclavicular adenopathy. Thyroid normal.  Lungs:  Clear without wheezing, rales or rhonchi Cardiac: RR, without RMG Abdominal:  Soft, nontender, without masses, guarding, rebound, organomegaly or hernia Breasts:  Examined lying and sitting without masses, retractions, discharge or axillary adenopathy. Pelvic:  Ext, BUS, Vagina: With atrophic changes  Cervix: With atrophic changes. Flush with upper vagina. Pap smear done.  Uterus: Small difficult to palpate.  Adnexa: Without masses or tenderness    Anus and perineum: Normal   Rectovaginal: Normal sphincter tone without palpated masses or tenderness.    Assessment/Plan:  51 y.o. [id number] female for annual exam.   1. Postmenopausal/atrophic genital changes. No significant hot flushes, night sweats, vaginal dryness or any vaginal bleeding. Continue to monitor report any issues or bleeding. 2. Osteopenia.  DEXA 2017 T score -2.3. Sees 09/16/2015 office note for discussion. Patient was started on alendronate last year and has done well with this. Refill 1 year provided. Plan repeat DEXA next year at 2 year interval. 3. Pap smear 2017. Pap smear done today. History CIN-1 1990. Negative Pap smears since. We'll continue with annual  cytology due to immunosuppression. 4. Mammography 04/2016. Continue with annual mammography when due. Breast exam normal today. SBE monthly reviewed. 5. History of leiomyoma. Uterus small and difficult to palpate. Continue with annual exam surveillance. 6. Colonoscopy 2017. Repeat at their recommended interval. 7. Health maintenance. No routine lab work done as patient reports this done elsewhere. Follow up 1 year, sooner as needed.   Anastasio Auerbach MD, 9:27 AM 08/31/2016

## 2016-09-01 LAB — PAP IG W/ RFLX HPV ASCU

## 2016-09-13 DIAGNOSIS — M810 Age-related osteoporosis without current pathological fracture: Secondary | ICD-10-CM | POA: Diagnosis not present

## 2016-09-13 DIAGNOSIS — Z79899 Other long term (current) drug therapy: Secondary | ICD-10-CM | POA: Diagnosis not present

## 2016-09-13 DIAGNOSIS — M329 Systemic lupus erythematosus, unspecified: Secondary | ICD-10-CM | POA: Diagnosis not present

## 2016-09-13 DIAGNOSIS — M7989 Other specified soft tissue disorders: Secondary | ICD-10-CM | POA: Diagnosis not present

## 2016-09-22 DIAGNOSIS — M170 Bilateral primary osteoarthritis of knee: Secondary | ICD-10-CM | POA: Diagnosis not present

## 2016-09-22 DIAGNOSIS — M2241 Chondromalacia patellae, right knee: Secondary | ICD-10-CM | POA: Diagnosis not present

## 2016-09-22 DIAGNOSIS — M25562 Pain in left knee: Secondary | ICD-10-CM | POA: Diagnosis not present

## 2016-09-22 DIAGNOSIS — M25561 Pain in right knee: Secondary | ICD-10-CM | POA: Diagnosis not present

## 2016-10-10 DIAGNOSIS — M810 Age-related osteoporosis without current pathological fracture: Secondary | ICD-10-CM | POA: Diagnosis not present

## 2016-10-10 DIAGNOSIS — M329 Systemic lupus erythematosus, unspecified: Secondary | ICD-10-CM | POA: Diagnosis not present

## 2016-10-10 DIAGNOSIS — Z79899 Other long term (current) drug therapy: Secondary | ICD-10-CM | POA: Diagnosis not present

## 2016-10-10 DIAGNOSIS — M25561 Pain in right knee: Secondary | ICD-10-CM | POA: Diagnosis not present

## 2016-10-13 DIAGNOSIS — M170 Bilateral primary osteoarthritis of knee: Secondary | ICD-10-CM | POA: Diagnosis not present

## 2016-10-13 DIAGNOSIS — M2242 Chondromalacia patellae, left knee: Secondary | ICD-10-CM | POA: Diagnosis not present

## 2016-10-13 DIAGNOSIS — M2241 Chondromalacia patellae, right knee: Secondary | ICD-10-CM | POA: Diagnosis not present

## 2016-10-13 DIAGNOSIS — M25561 Pain in right knee: Secondary | ICD-10-CM | POA: Diagnosis not present

## 2016-10-17 DIAGNOSIS — I10 Essential (primary) hypertension: Secondary | ICD-10-CM | POA: Diagnosis not present

## 2016-10-17 DIAGNOSIS — Z79899 Other long term (current) drug therapy: Secondary | ICD-10-CM | POA: Diagnosis not present

## 2016-10-17 DIAGNOSIS — M329 Systemic lupus erythematosus, unspecified: Secondary | ICD-10-CM | POA: Diagnosis not present

## 2016-11-05 ENCOUNTER — Other Ambulatory Visit: Payer: Self-pay | Admitting: Neurology

## 2016-11-07 NOTE — Telephone Encounter (Signed)
Rx printed, signed and faxed to pharmacy. 

## 2016-11-09 DIAGNOSIS — Z79899 Other long term (current) drug therapy: Secondary | ICD-10-CM | POA: Diagnosis not present

## 2016-11-09 DIAGNOSIS — M329 Systemic lupus erythematosus, unspecified: Secondary | ICD-10-CM | POA: Diagnosis not present

## 2016-11-09 DIAGNOSIS — H2513 Age-related nuclear cataract, bilateral: Secondary | ICD-10-CM | POA: Diagnosis not present

## 2016-11-09 DIAGNOSIS — H25013 Cortical age-related cataract, bilateral: Secondary | ICD-10-CM | POA: Diagnosis not present

## 2016-11-14 DIAGNOSIS — R635 Abnormal weight gain: Secondary | ICD-10-CM | POA: Diagnosis not present

## 2016-11-14 DIAGNOSIS — E669 Obesity, unspecified: Secondary | ICD-10-CM | POA: Diagnosis not present

## 2016-11-14 DIAGNOSIS — M329 Systemic lupus erythematosus, unspecified: Secondary | ICD-10-CM | POA: Diagnosis not present

## 2016-11-14 DIAGNOSIS — E010 Iodine-deficiency related diffuse (endemic) goiter: Secondary | ICD-10-CM | POA: Diagnosis not present

## 2016-11-15 ENCOUNTER — Other Ambulatory Visit: Payer: Self-pay | Admitting: Internal Medicine

## 2016-11-15 DIAGNOSIS — E010 Iodine-deficiency related diffuse (endemic) goiter: Secondary | ICD-10-CM

## 2016-11-17 DIAGNOSIS — R809 Proteinuria, unspecified: Secondary | ICD-10-CM | POA: Diagnosis not present

## 2016-11-20 ENCOUNTER — Ambulatory Visit: Payer: Medicare Other | Admitting: Neurology

## 2016-11-28 ENCOUNTER — Ambulatory Visit
Admission: RE | Admit: 2016-11-28 | Discharge: 2016-11-28 | Disposition: A | Discharge status: Home / Self Care | Payer: 59 | Source: Ambulatory Visit | Attending: Internal Medicine | Admitting: Internal Medicine

## 2016-11-28 DIAGNOSIS — E049 Nontoxic goiter, unspecified: Secondary | ICD-10-CM | POA: Diagnosis not present

## 2016-11-28 DIAGNOSIS — E010 Iodine-deficiency related diffuse (endemic) goiter: Secondary | ICD-10-CM

## 2016-12-14 ENCOUNTER — Encounter: Payer: Self-pay | Admitting: Cardiology

## 2016-12-14 DIAGNOSIS — M329 Systemic lupus erythematosus, unspecified: Secondary | ICD-10-CM | POA: Diagnosis not present

## 2016-12-14 DIAGNOSIS — E669 Obesity, unspecified: Secondary | ICD-10-CM | POA: Diagnosis not present

## 2016-12-14 DIAGNOSIS — R001 Bradycardia, unspecified: Secondary | ICD-10-CM | POA: Diagnosis not present

## 2016-12-14 DIAGNOSIS — Z23 Encounter for immunization: Secondary | ICD-10-CM | POA: Diagnosis not present

## 2016-12-14 DIAGNOSIS — E041 Nontoxic single thyroid nodule: Secondary | ICD-10-CM | POA: Diagnosis not present

## 2016-12-19 ENCOUNTER — Ambulatory Visit (INDEPENDENT_AMBULATORY_CARE_PROVIDER_SITE_OTHER): Payer: 59 | Admitting: Cardiology

## 2016-12-19 ENCOUNTER — Encounter: Payer: Self-pay | Admitting: Cardiology

## 2016-12-19 VITALS — BP 140/80 | HR 56 | Ht 64.0 in | Wt 181.0 lb

## 2016-12-19 DIAGNOSIS — R001 Bradycardia, unspecified: Secondary | ICD-10-CM

## 2016-12-19 DIAGNOSIS — M329 Systemic lupus erythematosus, unspecified: Secondary | ICD-10-CM | POA: Insufficient documentation

## 2016-12-19 DIAGNOSIS — R079 Chest pain, unspecified: Secondary | ICD-10-CM

## 2016-12-19 NOTE — Assessment & Plan Note (Signed)
-  Followed by Neurology. °

## 2016-12-19 NOTE — Assessment & Plan Note (Signed)
Controlled.  

## 2016-12-19 NOTE — Assessment & Plan Note (Signed)
Near renal failure in 2005- followed by Dr Lorrene Reid, SCr now normal

## 2016-12-19 NOTE — Assessment & Plan Note (Signed)
Negative GXT 2016

## 2016-12-19 NOTE — Assessment & Plan Note (Signed)
Pt referred by Dr Maudie Mercury for HR of 43 on routine office EKG- not clear she is symptomatic from this.

## 2016-12-19 NOTE — Patient Instructions (Addendum)
Medication Instructions:  Your physician recommends that you continue on your current medications as directed. Please refer to the Current Medication list given to you today.  Labwork: None   Testing/Procedures: Your physician has requested that you have an echocardiogram. Echocardiography is a painless test that uses sound waves to create images of your heart. It provides your doctor with information about the size and shape of your heart and how well your heart's chambers and valves are working. This procedure takes approximately one hour. There are no restrictions for this procedure.  Your physician has recommended that you wear an 7 day event monitor. Event monitors are medical devices that record the heart's electrical activity. Doctors most often [place] these monitors to diagnose arrhythmias. Arrhythmias are problems with the speed or rhythm of the heartbeat. The monitor is a small, portable device. You can wear one while you do your normal daily activities. This is usually used to diagnose what is causing palpitations/syncope (passing out).    Test will be completed at the [facility] [address]  Follow-Up: Your physician recommends that you schedule a follow-up appointment in: DR BERRY after ECHO  Any Other Special Instructions Will Be Listed Below (If Applicable).  If you need a refill on your cardiac medications before your next appointment, please call your pharmacy.

## 2016-12-19 NOTE — Progress Notes (Signed)
12/19/2016 Marie Peterson   10-27-1965  [id number]  Primary Physician [name], MD Primary Cardiologist: Dr Gwenlyn Found  HPI:  51 y/o female with a history of SLE nephritis and HTN. She has had atypical chest pain in the past. A GXT was normal in 2016. She has DJD and is not very active. She denies any syncope or near syncope but admits to easy fatigability. She recently saw Dr Maudie Mercury for an enlarged Thyroid and it was noted her HR was slow-43. In reviewing her past EKGs she has always been bradycardic with HR 45-55. The EKG from Dr Maudie Mercury shows normal PR, normal QTc 441, and narrow complex.    Current Outpatient Prescriptions  Medication Sig Dispense Refill  . alendronate (FOSAMAX) 70 MG tablet take 1 tablet by mouth every 7 days WITH A FULL GLASS OF WATER AND ON AN EMPTY STOMACH 4 tablet 12  . amLODipine (NORVASC) 5 MG tablet Take 5 mg by mouth daily.    [name] Kitchen aspirin 81 MG tablet Take 81 mg by mouth daily.    . BELSOMRA 10 MG TABS take 1 tablet by mouth at bedtime if needed for insomnia  0  . benazepril (LOTENSIN) 40 MG tablet Take 1 tablet by mouth daily.  1  . Cholecalciferol (VITAMIN D PO) Take 1,000 Int'l Units by mouth daily.     . diclofenac sodium (VOLTAREN) 1 % GEL Apply topically.    [name] Kitchen DIVALPROEX SODIUM PO Take by mouth.    . FERREX 150 150 MG capsule Take 1 tablet by mouth daily.  1  . folic acid (FOLVITE) 962 MCG tablet Take 400 mcg by mouth daily.    . furosemide (LASIX) 40 MG tablet Take 1 tablet by mouth 2 (two) times daily. TAKE 1 AND 1/2 TABLET IN THE MORNING AND 1 TABLET AT NIGHT.  0  . HYDROcodone-acetaminophen (NORCO) 10-325 MG per tablet Take 1 tablet by mouth every 6 (six) hours as needed for pain. 1-3 Tabs per day as needed    . hydroxychloroquine (PLAQUENIL) 200 MG tablet Take 2 tablets by mouth daily.  0  . L-Methylfolate-B6-B12 (METANX PO) Take 1 tablet by mouth daily.    [name] Kitchen lidocaine (LIDODERM) 5 % Place 3 patches onto the skin daily. Remove & Discard patch within 12 hours  or as directed by MD 90 patch 6  . LYRICA 150 MG capsule take 1 capsule by mouth twice a day 60 capsule 5  . magnesium oxide (MAG-OX) 400 (241.3 Mg) MG tablet take 1 tablet by mouth once daily 30 tablet 9  . Multiple Vitamin (MULTIVITAMIN) tablet Take 1 tablet by mouth daily.    . nabumetone (RELAFEN) 500 MG tablet Take 500 mg by mouth 2 (two) times daily as needed.  0  . Omega-3 Fatty Acids (FISH OIL) 1000 MG CAPS Take 2 capsules by mouth daily.    [name] Kitchen omeprazole (PRILOSEC) 40 MG capsule Take 40 mg by mouth daily.  1  . ondansetron (ZOFRAN ODT) 4 MG disintegrating tablet Take 1 tablet (4 mg total) by mouth every 8 (eight) hours as needed for nausea or vomiting. 20 tablet 11  . oxybutynin (DITROPAN-XL) 10 MG 24 hr tablet Take 1 tablet by mouth daily.  0  . potassium chloride SA (K-DUR,KLOR-CON) 20 MEQ tablet take 1 tablet by mouth once daily 30 tablet 8  . predniSONE (DELTASONE) 5 MG tablet Take 5 mg by mouth daily.    . SUMAtriptan (IMITREX) 100 MG tablet TAKE 1 TABLET BY MOUTH  AT ONSET OF HEADACHE MAY REPEAT IN 2 HOURS IF HEADACHE CONTINUES 10 tablet 11   No current facility-administered medications for this visit.     Allergies  Allergen Reactions  . Myrbetriq [Mirabegron] Itching    Past Medical History:  Diagnosis Date  . Arthritis   . Atrophic vaginitis   . Atypical chest pain    thought to be GERD; myoview 05/17/07-no significant ischemia; echo 07/20/11- normal EF=>55%  . Baker's cyst 03/28/12   venous doppler 03/28/12=no thrombus; baker's cyst at left popliteal fossa  . Chest pain   . CIN I (cervical intraepithelial neoplasia I) 1990  . Fibroid   . Hypertension   . Lupus nephritis ([place])   . Migraines   . Neuropathy   . Osteopenia 08/2015   T score -2.3 FRAX 3.2%/0.3%  . Premature menopause age 43   following chemotherapy for Lupus  . Shingles     Social History   Social History  . Marital status: Married    Spouse name: [name]  . Number of children: 2  . Years of  education: College   Occupational History  .  Disabled    disability   Social History Main Topics  . Smoking status: Never Smoker  . Smokeless tobacco: Never Used  . Alcohol use No  . Drug use: No  . Sexual activity: Not Currently    Birth control/ protection: Surgical, Post-menopausal     Comment: Tubal lig-1st intercourse 51 yo-Fewer than 5 partners   Other Topics Concern  . Not on file   Social History Narrative   Patient lives at home with her family.   Caffeine Use: 2 cups of coffee daily.    Right-handed     Family History  Problem Relation Age of Onset  . Hypertension Mother   . Asthma Mother   . COPD Mother   . Diabetes Father   . Hypertension Father   . Heart disease Father      Review of Systems: General: negative for chills, fever, night sweats or weight changes.  Cardiovascular: negative for chest pain, dyspnea on exertion, edema, orthopnea, palpitations, paroxysmal nocturnal dyspnea or shortness of breath Dermatological: negative for rash Respiratory: negative for cough or wheezing Urologic: negative for hematuria Abdominal: negative for nausea, vomiting, diarrhea, bright red blood per rectum, melena, or hematemesis Neurologic: negative for visual changes, syncope, or dizziness All other systems reviewed and are otherwise negative except as noted above.    Blood pressure 140/80, pulse (!) 56, height 5\' 4"  (1.626 m), weight 181 lb (82.1 kg), SpO2 98 %.  General appearance: alert, cooperative, no distress and mildly obese Lungs: clear to auscultation bilaterally Heart: regular rate and rhythm Extremities: extremities normal, atraumatic, no cyanosis or edema Skin: Skin color, texture, turgor normal. No rashes or lesions Neurologic: Grossly normal  EKG NSR, SB 43  ASSESSMENT AND PLAN:   Sinus bradycardia on ECG Pt referred by Dr Maudie Mercury for HR of 43 on routine office EKG- not clear she is symptomatic from this.  Lupus nephritis ([place]) Near renal  failure in 2005- followed by Dr Lorrene Reid, SCr now normal  History of systemic lupus erythematosus (SLE) Followed by [facility] hypertension Controlled  Atypical chest pain Negative GXT 2016  Migraines Followed by Neurology   PLAN  TSH done at [facility] 12/14/16 was WNL- 1/13. I ordered an echo (for chest pain and hx of SLE) and an event monitor. F/U with Dr Gwenlyn Found after the above completed.   Lurena Joiner  Darby Fleeman PA-C 12/19/2016 11:22 AM

## 2016-12-19 NOTE — Assessment & Plan Note (Signed)
Followed by [place]

## 2016-12-26 ENCOUNTER — Encounter: Payer: Self-pay | Admitting: Neurology

## 2016-12-26 ENCOUNTER — Ambulatory Visit (INDEPENDENT_AMBULATORY_CARE_PROVIDER_SITE_OTHER): Payer: 59 | Admitting: Neurology

## 2016-12-26 VITALS — BP 129/74 | HR 44 | Wt 181.4 lb

## 2016-12-26 DIAGNOSIS — G43909 Migraine, unspecified, not intractable, without status migrainosus: Secondary | ICD-10-CM | POA: Diagnosis not present

## 2016-12-26 DIAGNOSIS — G43009 Migraine without aura, not intractable, without status migrainosus: Secondary | ICD-10-CM | POA: Diagnosis not present

## 2016-12-26 MED ORDER — SUMATRIPTAN SUCCINATE 11 MG/NOSEPC NA EXHP: 1.0000 | INHALANT_POWDER | Freq: Once | NASAL | 11 refills | Status: DC

## 2016-12-26 NOTE — Progress Notes (Signed)
[facility]    Provider:  Dr Jaynee Eagles Referring Provider: [name], MD Primary Care Physician:  [name], MD  CC: Migraines   Interval history 12/26/2016: The nasal powder works (imitrex Animator) use it 3 days a month for severe migraines, Out of 30 days, she has 15 headache days a month. No OTC medications. No medication overuse. No snoring, not excessive fatigue during the day. No morning headaches.   Interval history 05/08/2016: Patient is here for follow up on migraines. She has a PMHx of lupus with glomerulonephritis, osteoarthritis, shingles. She has been seen here in the office for small-fiber neuropathy as well as migraines.She could not tolerate 25mg  of nortriptyline at night and was placed on 10mg  at night. She did not tolerate the Topamax. Depakote with side effects as well. She is using triptan and zofran for migraines. She is on Lyrica for post-herpetic neuralgia. She had recent sinus surgery with Dr. Minna Merritts. She still has migraines but they are improved. The headaches can linger on and the imitrex helps but doesn;t take it away, zofran helps with the nausea. The migraines progress over an hour to maxumum severity, discussed nose sprays or pwders or injectable but prefers pills.   CHE:NIDP L McLeanis a 51 y.o.femalehere as a referral from Dr. Wonda Olds continued migraines. Migraines started worsening about 2 weeks ago. Before this she was very well controlled. She had migraines every day. She had side effects to the Topamax, withincrease started getting double vision, she went back to 50mg  bid. She feels she has memory loss with the Topamax. She has insomnia, she takes [place] every night, she still has problems. Migraines are on the left side. Pounding in her head. They last most of the day. Daily. +light sensitivity, +sound sensitivity. +nausea. No vomiting. They can be up to 10/10, last week it was awful 10/10 usually 6/10 on average of pain.  Imitrex usually works. Advised to take with a zofran. Discussed triggers, discussed other medication possibilities.    Last visit:She has a PMHx of lupus with glomerulonephritis, osteoarthritis, shingles. She has been seen here in the office for small-fiber neuropathy as well as migraines. She is on the Topamax. She gets migraines twice a month. Migraines are more on the right. She has nausea, light sensitivity. No aura. They can last all day. She is in bed most of the day. She takes the imitrex at the onset and it helps. No side effects from the topamax and imitrex. She uses the Lidoderm patches for her post herpetic neuralgia but it was $75. It gets worse in the winter time, now it is ok. Pain is better in the summer. Lyrica helped a lot. She is having pains in her feet at the bottom and it wakes her up. She has sharp pains in the heels. The pain is when she walks on her heel. And is tender at night in the heel with sharp shooting pains in the heel. Pain right >left heel.   Medications tried for migraines: Imitrex, Topamax (50 qam and 100mg  qpm, had side effects of memory loss and blurry vision),imitrex   Review of Systems: Patient complains of symptoms per HPI as well as the following symptoms: light sensitivity, blurred vision, joint pain, joint swelling, aching muscles, muscle cramps, rash, dizziness, headache. Pertinent negatives per HPI. All others negative.   Social History   Social History  . Marital status: Married    Spouse name: [name]  . Number of children: 2  . Years of  education: College   Occupational History  .  Disabled    disability   Social History Main Topics  . Smoking status: Never Smoker  . Smokeless tobacco: Never Used  . Alcohol use No  . Drug use: No  . Sexual activity: Not Currently    Birth control/ protection: Surgical, Post-menopausal     Comment: Tubal lig-1st intercourse 51 yo-Fewer than 5 partners   Other Topics Concern  . Not on file    Social History Narrative   Patient lives at home with her family.   Caffeine Use: 2 cups of coffee daily.    Right-handed    Family History  Problem Relation Age of Onset  . Hypertension Mother   . Asthma Mother   . COPD Mother   . Diabetes Father   . Hypertension Father   . Heart disease Father     Past Medical History:  Diagnosis Date  . Arthritis   . Atrophic vaginitis   . Atypical chest pain    thought to be GERD; myoview 05/17/07-no significant ischemia; echo 07/20/11- normal EF=>55%  . Baker's cyst 03/28/12   venous doppler 03/28/12=no thrombus; baker's cyst at left popliteal fossa  . Chest pain   . CIN I (cervical intraepithelial neoplasia I) 1990  . Fibroid   . Hypertension   . Lupus nephritis ([place])   . Migraines   . Neuropathy   . Osteopenia 08/2015   T score -2.3 FRAX 3.2%/0.3%  . Premature menopause age 78   following chemotherapy for Lupus  . Shingles     Past Surgical History:  Procedure Laterality Date  . CESAREAN SECTION    . COLPOSCOPY    . HYSTEROSCOPY     Myomectomy  . MYOMECTOMY     Hysteroscopic myomectomy  . NASAL SINUS SURGERY  01/2016  . TUBAL LIGATION      Current Outpatient Prescriptions  Medication Sig Dispense Refill  . alendronate (FOSAMAX) 70 MG tablet take 1 tablet by mouth every 7 days WITH A FULL GLASS OF WATER AND ON AN EMPTY STOMACH 4 tablet 12  . amLODipine (NORVASC) 5 MG tablet Take 5 mg by mouth daily.     [name] Kitchen aspirin 81 MG tablet Take 81 mg by mouth daily.    . BELSOMRA 10 MG TABS take 1 tablet by mouth at bedtime if needed for insomnia  0  . benazepril (LOTENSIN) 40 MG tablet Take 1 tablet by mouth daily.  1  . Cholecalciferol (VITAMIN D PO) Take 1,000 Int'l Units by mouth daily.     . diclofenac sodium (VOLTAREN) 1 % GEL Apply topically.    [name] Kitchen DIVALPROEX SODIUM PO Take by mouth.    . FERREX 150 150 MG capsule Take 1 tablet by mouth daily.  1  . folic acid (FOLVITE) 505 MCG tablet Take 400 mcg by mouth daily.    .  furosemide (LASIX) 40 MG tablet Take 1 tablet by mouth 2 (two) times daily. TAKE 1 AND 1/2 TABLET IN THE MORNING AND 1 TABLET AT NIGHT.  0  . HYDROcodone-acetaminophen (NORCO) 10-325 MG per tablet Take 1 tablet by mouth every 6 (six) hours as needed for pain. 1-3 Tabs per day as needed    . hydroxychloroquine (PLAQUENIL) 200 MG tablet Take 2 tablets by mouth daily.  0  . L-Methylfolate-B6-B12 (METANX PO) Take 1 tablet by mouth daily.    [name] Kitchen lidocaine (LIDODERM) 5 % Place 3 patches onto the skin daily. Remove & Discard patch within 12 hours  or as directed by MD 90 patch 6  . LYRICA 150 MG capsule take 1 capsule by mouth twice a day 60 capsule 5  . magnesium oxide (MAG-OX) 400 (241.3 Mg) MG tablet take 1 tablet by mouth once daily 30 tablet 9  . Multiple Vitamin (MULTIVITAMIN) tablet Take 1 tablet by mouth daily.    . nabumetone (RELAFEN) 500 MG tablet Take 500 mg by mouth 2 (two) times daily as needed.  0  . Omega-3 Fatty Acids (FISH OIL) 1000 MG CAPS Take 2 capsules by mouth daily.    [name] Kitchen omeprazole (PRILOSEC) 40 MG capsule Take 40 mg by mouth daily.  1  . ondansetron (ZOFRAN ODT) 4 MG disintegrating tablet Take 1 tablet (4 mg total) by mouth every 8 (eight) hours as needed for nausea or vomiting. 20 tablet 11  . oxybutynin (DITROPAN-XL) 10 MG 24 hr tablet Take 1 tablet by mouth daily.  0  . potassium chloride SA (K-DUR,KLOR-CON) 20 MEQ tablet take 1 tablet by mouth once daily 30 tablet 8  . predniSONE (DELTASONE) 5 MG tablet Take 5 mg by mouth daily.    . SUMAtriptan (IMITREX) 100 MG tablet TAKE 1 TABLET BY MOUTH AT ONSET OF HEADACHE MAY REPEAT IN 2 HOURS IF HEADACHE CONTINUES 10 tablet 11  . SUMAtriptan Succinate (ONZETRA XSAIL) 11 MG/NOSEPC EXHP Place into the nose.    . SUMAtriptan Succinate (ONZETRA XSAIL) 11 MG/NOSEPC EXHP Place 1 spray into both nostrils once. Administer 2 nose pieces one per nostril. At onset of migraine can repeat once after 2 hours. 1 each 11   No current  facility-administered medications for this visit.     Allergies as of 12/26/2016 - Review Complete 12/19/2016  Allergen Reaction Noted  . Myrbetriq [mirabegron] Itching 10/21/2014    Vitals: BP 129/74 (BP Location: Right Arm, Patient Position: Sitting, Cuff Size: Small)   Pulse (!) 44   Wt 181 lb 6.4 oz (82.3 kg)   BMI 31.14 kg/m  Last Weight:  Wt Readings from Last 1 Encounters:  12/26/16 181 lb 6.4 oz (82.3 kg)   Last Height:   Ht Readings from Last 1 Encounters:  12/19/16 5\' 4"  (1.626 m)    Physical exam: Exam: Gen: NAD, conversant, well nourised, obese, well groomed                     Eyes: Conjunctivae clear without exudates or hemorrhage  Neuro: Detailed Neurologic Exam  Speech:    Speech is normal; fluent and spontaneous with normal comprehension.  Cognition:    The patient is oriented to person, place, and time;  Cranial Nerves:    The pupils are equal, round, and reactive to light. The fundi are normal and spontaneous venous pulsations are present. Visual fields are full to finger confrontation. Extraocular movements are intact. Trigeminal sensation is intact and the muscles of mastication are normal. The face is symmetric. The palate elevates in the midline. Hearing intact. Voice is normal. Shoulder shrug is normal. The tongue has normal motion without fasciculations.  Motor Observation:    No asymmetry, no atrophy, and no involuntary movements noted. Tone:    Normal muscle tone.    Posture:    Posture is normal. normal erect    Strength:    Strength is V/V in the upper and lower limbs.      Assessment/Plan:51 year old with migraines. Side effects to Topamax, depakote, nortriptyline. Fortunately her migraine frequency is reduced after sinus surgery with Dr. Ernesto Rutherford. Will  continue acute management with onzetra.   Discussed Ajovy and Aimovig, she will look into it and get back to [place] Have discussed botox, she is not interested   Sarina Ill,  MD  [facility] [address] [place], [place] [address]  Phone [phone] Fax [phone]  A total of 15 minutes was spent face-to-face with this patient. Over half this time was spent on counseling patient on the migraine diagnosis and different diagnostic and therapeutic options available.

## 2016-12-28 ENCOUNTER — Ambulatory Visit (HOSPITAL_COMMUNITY): Payer: 59 | Attending: Cardiology

## 2016-12-28 ENCOUNTER — Other Ambulatory Visit: Payer: Self-pay

## 2016-12-28 DIAGNOSIS — I10 Essential (primary) hypertension: Secondary | ICD-10-CM | POA: Insufficient documentation

## 2016-12-28 DIAGNOSIS — R001 Bradycardia, unspecified: Secondary | ICD-10-CM | POA: Diagnosis not present

## 2016-12-28 DIAGNOSIS — R079 Chest pain, unspecified: Secondary | ICD-10-CM

## 2016-12-28 MED ORDER — PERFLUTREN LIPID MICROSPHERE
1.0000 mL | INTRAVENOUS | Status: AC | PRN
  Administered 2016-12-28: 2 mL via INTRAVENOUS

## 2017-01-04 ENCOUNTER — Ambulatory Visit (INDEPENDENT_AMBULATORY_CARE_PROVIDER_SITE_OTHER): Payer: 59

## 2017-01-04 DIAGNOSIS — R079 Chest pain, unspecified: Secondary | ICD-10-CM | POA: Diagnosis not present

## 2017-01-04 DIAGNOSIS — R001 Bradycardia, unspecified: Secondary | ICD-10-CM

## 2017-01-05 DIAGNOSIS — R001 Bradycardia, unspecified: Secondary | ICD-10-CM | POA: Diagnosis not present

## 2017-01-09 ENCOUNTER — Ambulatory Visit (INDEPENDENT_AMBULATORY_CARE_PROVIDER_SITE_OTHER): Payer: 59 | Admitting: Cardiovascular Disease

## 2017-01-09 ENCOUNTER — Encounter: Payer: Self-pay | Admitting: Cardiovascular Disease

## 2017-01-09 DIAGNOSIS — I10 Essential (primary) hypertension: Secondary | ICD-10-CM

## 2017-01-09 DIAGNOSIS — R0789 Other chest pain: Secondary | ICD-10-CM

## 2017-01-09 DIAGNOSIS — R001 Bradycardia, unspecified: Secondary | ICD-10-CM | POA: Diagnosis not present

## 2017-01-09 NOTE — Assessment & Plan Note (Signed)
History of atypical chest pain with negative stress test most recently performed 11/12/14.

## 2017-01-09 NOTE — Patient Instructions (Signed)

## 2017-01-09 NOTE — Progress Notes (Signed)
01/09/2017 Marie Peterson   01-02-1966  [id number]  Primary Physician [name], MD Primary Cardiologist: Lorretta Harp MD [name], [place]  HPI:  Marie Peterson is a 51 y.o. female  mildly overweight married an American female mother of 2 daughters one of them goes to the [employer], and the other graduated from [employer] and is now getting her MBA at state,  who is formally a patient of Dr. Lowella Fairy. She currently is out on disability because of SLE. I last saw her in the office 01/19/16.Her factors include treated hypertension. Her father had bypass surgery at age 83. She's never had a heart attack or stroke. She denies chest pain or shortness of breath. She does have GERD. Since I saw her in the office one year ago she continues to have occasional atypical chest pain. A routine GXT performed 11/12/14 was entirely normal. Since I saw her in the office a year ago she has seen Kerin Ransom last month complaining of some fatigue and bradycardia. A 2-D echo is entirely normal. She is currently wearing a 1 week event monitor.   Current Meds  Medication Sig  . alendronate (FOSAMAX) 70 MG tablet take 1 tablet by mouth every 7 days WITH A FULL GLASS OF WATER AND ON AN EMPTY STOMACH  . amLODipine (NORVASC) 5 MG tablet Take 5 mg by mouth daily.   [name] Kitchen aspirin 81 MG tablet Take 81 mg by mouth daily.  . BELSOMRA 10 MG TABS take 1 tablet by mouth at bedtime if needed for insomnia  . benazepril (LOTENSIN) 40 MG tablet Take 1 tablet by mouth daily.  . Cholecalciferol (VITAMIN D PO) Take 1,000 Int'l Units by mouth daily.   . diclofenac sodium (VOLTAREN) 1 % GEL Apply topically.  [name] Kitchen DIVALPROEX SODIUM PO Take by mouth.  . FERREX 150 150 MG capsule Take 1 tablet by mouth daily.  . folic acid (FOLVITE) 458 MCG tablet Take 400 mcg by mouth daily.  . furosemide (LASIX) 40 MG tablet Take 1 tablet by mouth 2 (two) times daily. TAKE 1 AND 1/2 TABLET IN THE MORNING AND 1 TABLET AT NIGHT.  [name] Kitchen  HYDROcodone-acetaminophen (NORCO) 10-325 MG per tablet Take 1 tablet by mouth every 6 (six) hours as needed for pain. 1-3 Tabs per day as needed  . hydroxychloroquine (PLAQUENIL) 200 MG tablet Take 2 tablets by mouth daily.  [name] Kitchen L-Methylfolate-B6-B12 (METANX PO) Take 1 tablet by mouth daily.  [name] Kitchen lidocaine (LIDODERM) 5 % Place 3 patches onto the skin daily. Remove & Discard patch within 12 hours or as directed by MD  . LYRICA 150 MG capsule take 1 capsule by mouth twice a day  . magnesium oxide (MAG-OX) 400 (241.3 Mg) MG tablet take 1 tablet by mouth once daily  . Multiple Vitamin (MULTIVITAMIN) tablet Take 1 tablet by mouth daily.  . nabumetone (RELAFEN) 500 MG tablet Take 500 mg by mouth 2 (two) times daily as needed.  . Omega-3 Fatty Acids (FISH OIL) 1000 MG CAPS Take 2 capsules by mouth daily.  [name] Kitchen omeprazole (PRILOSEC) 40 MG capsule Take 40 mg by mouth daily.  . ondansetron (ZOFRAN ODT) 4 MG disintegrating tablet Take 1 tablet (4 mg total) by mouth every 8 (eight) hours as needed for nausea or vomiting.  [name] Kitchen oxybutynin (DITROPAN-XL) 10 MG 24 hr tablet Take 1 tablet by mouth daily.  . potassium chloride SA (K-DUR,KLOR-CON) 20 MEQ tablet take 1 tablet by mouth once daily  . predniSONE (DELTASONE)  5 MG tablet Take 5 mg by mouth daily.  . SUMAtriptan (IMITREX) 100 MG tablet TAKE 1 TABLET BY MOUTH AT ONSET OF HEADACHE MAY REPEAT IN 2 HOURS IF HEADACHE CONTINUES  . SUMAtriptan Succinate (ONZETRA XSAIL) 11 MG/NOSEPC EXHP Place into the nose.     Allergies  Allergen Reactions  . Myrbetriq [Mirabegron] Itching    Social History   Social History  . Marital status: Married    Spouse name: Marie Peterson  . Number of children: 2  . Years of education: College   Occupational History  .  Disabled    disability   Social History Main Topics  . Smoking status: Never Smoker  . Smokeless tobacco: Never Used  . Alcohol use No  . Drug use: No  . Sexual activity: Not Currently    Birth control/ protection:  Surgical, Post-menopausal     Comment: Tubal lig-1st intercourse 51 yo-Fewer than 5 partners   Other Topics Concern  . Not on file   Social History Narrative   Patient lives at home with her family.   Caffeine Use: 2 cups of coffee daily.    Right-handed     Review of Systems: General: negative for chills, fever, night sweats or weight changes.  Cardiovascular: negative for chest pain, dyspnea on exertion, edema, orthopnea, palpitations, paroxysmal nocturnal dyspnea or shortness of breath Dermatological: negative for rash Respiratory: negative for cough or wheezing Urologic: negative for hematuria Abdominal: negative for nausea, vomiting, diarrhea, bright red blood per rectum, melena, or hematemesis Neurologic: negative for visual changes, syncope, or dizziness All other systems reviewed and are otherwise negative except as noted above.    Blood pressure 126/80, pulse (!) 54, height 5\' 5"  (1.651 m), weight 182 lb (82.6 kg), SpO2 98 %.  General appearance: alert and no distress Neck: no adenopathy, no carotid bruit, no JVD, supple, symmetrical, trachea midline and thyroid not enlarged, symmetric, no tenderness/mass/nodules Lungs: clear to auscultation bilaterally Heart: regular rate and rhythm, S1, S2 normal, no murmur, click, rub or gallop Extremities: extremities normal, atraumatic, no cyanosis or edema Pulses: 2+ and symmetric Skin: Skin color, texture, turgor normal. No rashes or lesions Neurologic: Alert and oriented X 3, normal strength and tone. Normal symmetric reflexes. Normal coordination and gait  EKG not performed today  ASSESSMENT AND PLAN:   Essential hypertension History of essential hypertension blood pressure pressure today 126/80. She is on amlodipine and Lotensin. Continue current meds at current dosing.  Atypical chest pain History of atypical chest pain with negative stress test most recently performed 11/12/14.  Sinus bradycardia on ECG History of  bradycardia and fatigue currently wearing a 1 week event monitor to further evaluate.      Lorretta Harp MD FACP,FACC,FAHA, [facility] 01/09/2017 9:44 AM

## 2017-01-09 NOTE — Assessment & Plan Note (Signed)
History of essential hypertension blood pressure pressure today 126/80. She is on amlodipine and Lotensin. Continue current meds at current dosing.

## 2017-01-09 NOTE — Assessment & Plan Note (Signed)
History of bradycardia and fatigue currently wearing a 1 week event monitor to further evaluate.

## 2017-01-11 DIAGNOSIS — M25561 Pain in right knee: Secondary | ICD-10-CM | POA: Diagnosis not present

## 2017-01-11 DIAGNOSIS — M810 Age-related osteoporosis without current pathological fracture: Secondary | ICD-10-CM | POA: Diagnosis not present

## 2017-01-11 DIAGNOSIS — M329 Systemic lupus erythematosus, unspecified: Secondary | ICD-10-CM | POA: Diagnosis not present

## 2017-01-11 DIAGNOSIS — Z79899 Other long term (current) drug therapy: Secondary | ICD-10-CM | POA: Diagnosis not present

## 2017-01-19 DIAGNOSIS — R32 Unspecified urinary incontinence: Secondary | ICD-10-CM | POA: Diagnosis not present

## 2017-01-19 DIAGNOSIS — E669 Obesity, unspecified: Secondary | ICD-10-CM | POA: Diagnosis not present

## 2017-01-25 ENCOUNTER — Other Ambulatory Visit: Payer: Self-pay | Admitting: Cardiovascular Disease

## 2017-02-11 ENCOUNTER — Other Ambulatory Visit: Payer: Self-pay | Admitting: Cardiovascular Disease

## 2017-02-14 DIAGNOSIS — R635 Abnormal weight gain: Secondary | ICD-10-CM | POA: Diagnosis not present

## 2017-02-14 DIAGNOSIS — Z5181 Encounter for therapeutic drug level monitoring: Secondary | ICD-10-CM | POA: Diagnosis not present

## 2017-02-14 DIAGNOSIS — I10 Essential (primary) hypertension: Secondary | ICD-10-CM | POA: Diagnosis not present

## 2017-02-14 DIAGNOSIS — Z79899 Other long term (current) drug therapy: Secondary | ICD-10-CM | POA: Diagnosis not present

## 2017-02-23 DIAGNOSIS — M329 Systemic lupus erythematosus, unspecified: Secondary | ICD-10-CM | POA: Diagnosis not present

## 2017-02-23 DIAGNOSIS — Z0000 Encounter for general adult medical examination without abnormal findings: Secondary | ICD-10-CM | POA: Diagnosis not present

## 2017-03-07 DIAGNOSIS — M25542 Pain in joints of left hand: Secondary | ICD-10-CM | POA: Diagnosis not present

## 2017-03-07 DIAGNOSIS — M7989 Other specified soft tissue disorders: Secondary | ICD-10-CM | POA: Diagnosis not present

## 2017-03-12 DIAGNOSIS — R350 Frequency of micturition: Secondary | ICD-10-CM | POA: Diagnosis not present

## 2017-03-12 DIAGNOSIS — R351 Nocturia: Secondary | ICD-10-CM | POA: Diagnosis not present

## 2017-04-05 ENCOUNTER — Encounter: Payer: Self-pay | Admitting: Neurology

## 2017-04-09 ENCOUNTER — Other Ambulatory Visit: Payer: Self-pay | Admitting: Neurology

## 2017-04-09 MED ORDER — GABAPENTIN 600 MG PO TABS: 600.0000 mg | ORAL_TABLET | Freq: Three times a day (TID) | ORAL | 6 refills | Status: DC

## 2017-04-10 ENCOUNTER — Other Ambulatory Visit: Payer: Self-pay | Admitting: Neurology

## 2017-04-10 DIAGNOSIS — R351 Nocturia: Secondary | ICD-10-CM | POA: Diagnosis not present

## 2017-04-10 DIAGNOSIS — M6281 Muscle weakness (generalized): Secondary | ICD-10-CM | POA: Diagnosis not present

## 2017-04-10 DIAGNOSIS — M62838 Other muscle spasm: Secondary | ICD-10-CM | POA: Diagnosis not present

## 2017-04-10 DIAGNOSIS — R102 Pelvic and perineal pain: Secondary | ICD-10-CM | POA: Diagnosis not present

## 2017-04-10 DIAGNOSIS — R350 Frequency of micturition: Secondary | ICD-10-CM | POA: Diagnosis not present

## 2017-04-13 DIAGNOSIS — M25561 Pain in right knee: Secondary | ICD-10-CM | POA: Diagnosis not present

## 2017-04-13 DIAGNOSIS — Z79899 Other long term (current) drug therapy: Secondary | ICD-10-CM | POA: Diagnosis not present

## 2017-04-13 DIAGNOSIS — M1990 Unspecified osteoarthritis, unspecified site: Secondary | ICD-10-CM | POA: Diagnosis not present

## 2017-04-13 DIAGNOSIS — M810 Age-related osteoporosis without current pathological fracture: Secondary | ICD-10-CM | POA: Diagnosis not present

## 2017-04-13 DIAGNOSIS — M797 Fibromyalgia: Secondary | ICD-10-CM | POA: Diagnosis not present

## 2017-04-13 DIAGNOSIS — M329 Systemic lupus erythematosus, unspecified: Secondary | ICD-10-CM | POA: Diagnosis not present

## 2017-04-17 DIAGNOSIS — Z79899 Other long term (current) drug therapy: Secondary | ICD-10-CM | POA: Diagnosis not present

## 2017-04-17 DIAGNOSIS — M329 Systemic lupus erythematosus, unspecified: Secondary | ICD-10-CM | POA: Diagnosis not present

## 2017-04-17 DIAGNOSIS — H04123 Dry eye syndrome of bilateral lacrimal glands: Secondary | ICD-10-CM | POA: Diagnosis not present

## 2017-04-26 DIAGNOSIS — E669 Obesity, unspecified: Secondary | ICD-10-CM | POA: Diagnosis not present

## 2017-04-26 DIAGNOSIS — E559 Vitamin D deficiency, unspecified: Secondary | ICD-10-CM | POA: Diagnosis not present

## 2017-04-26 DIAGNOSIS — R946 Abnormal results of thyroid function studies: Secondary | ICD-10-CM | POA: Diagnosis not present

## 2017-04-26 DIAGNOSIS — I10 Essential (primary) hypertension: Secondary | ICD-10-CM | POA: Diagnosis not present

## 2017-04-26 DIAGNOSIS — M329 Systemic lupus erythematosus, unspecified: Secondary | ICD-10-CM | POA: Diagnosis not present

## 2017-05-01 ENCOUNTER — Encounter: Payer: Self-pay | Admitting: Neurology

## 2017-05-05 ENCOUNTER — Other Ambulatory Visit: Payer: Self-pay | Admitting: Neurology

## 2017-05-15 DIAGNOSIS — R351 Nocturia: Secondary | ICD-10-CM | POA: Diagnosis not present

## 2017-05-15 DIAGNOSIS — R350 Frequency of micturition: Secondary | ICD-10-CM | POA: Diagnosis not present

## 2017-05-16 ENCOUNTER — Encounter: Payer: Self-pay | Admitting: Neurology

## 2017-05-20 ENCOUNTER — Encounter: Payer: Self-pay | Admitting: Neurology

## 2017-05-21 ENCOUNTER — Telehealth: Payer: Self-pay | Admitting: Neurology

## 2017-05-21 NOTE — Telephone Encounter (Signed)
Called pt & scheduled nurse visit for this Thurs 2/28 @ 9:45 AM.

## 2017-05-21 NOTE — Telephone Encounter (Signed)
Can you call patient? Would like to have her come in and get 3 ajovy shots this week, Tues or Thursday best for her - then prescribe her 3 shots every 3 months and she can come to the office and get them injected. thanks

## 2017-05-24 ENCOUNTER — Ambulatory Visit (INDEPENDENT_AMBULATORY_CARE_PROVIDER_SITE_OTHER): Payer: 59 | Admitting: *Deleted

## 2017-05-24 ENCOUNTER — Encounter: Payer: Self-pay | Admitting: *Deleted

## 2017-05-24 DIAGNOSIS — G43009 Migraine without aura, not intractable, without status migrainosus: Secondary | ICD-10-CM | POA: Diagnosis not present

## 2017-05-24 DIAGNOSIS — Z7189 Other specified counseling: Secondary | ICD-10-CM

## 2017-05-24 MED ORDER — FREMANEZUMAB-VFRM 225 MG/1.5ML ~~LOC~~ SOSY: 675.0000 mg | PREFILLED_SYRINGE | SUBCUTANEOUS | 3 refills | Status: DC

## 2017-05-24 MED ORDER — FREMANEZUMAB-VFRM 225 MG/1.5ML ~~LOC~~ SOSY: 675.0000 mg | PREFILLED_SYRINGE | SUBCUTANEOUS | 0 refills | Status: DC

## 2017-05-24 NOTE — Progress Notes (Signed)
Discussed Ajovy injection with the patient and gave them instruction sheets from the box. The patient is aware that injection is subcutaneous and should be given 3 injections every 3 months. Syringes should remain refrigerated until ready for use. Let the syringe sit at room temperature for 30 minutes prior to injection. The patient was also educated on the available sites to rotate each month (abdomen, thigh, and back of upper arm). I discussed proper technique for skin prep prior to injection including to wash hands, cleanse the site inside to outside in a circular motion and allow skin to air dry. The patient was instructed how to pinch the skin, inject the syringe at a 45-90 degree angle, and inject all of the medication. Immediately place the used syringe in the sharps container and do not recap. RN administered 3 syringes today in the  RUQ of Abdomen. The patient tolerated well and verbalized understanding of instructions. Pt given copay card and printed copy of side effects from [employer].

## 2017-07-12 DIAGNOSIS — M1990 Unspecified osteoarthritis, unspecified site: Secondary | ICD-10-CM | POA: Diagnosis not present

## 2017-07-12 DIAGNOSIS — M797 Fibromyalgia: Secondary | ICD-10-CM | POA: Diagnosis not present

## 2017-07-12 DIAGNOSIS — M810 Age-related osteoporosis without current pathological fracture: Secondary | ICD-10-CM | POA: Diagnosis not present

## 2017-07-12 DIAGNOSIS — M329 Systemic lupus erythematosus, unspecified: Secondary | ICD-10-CM | POA: Diagnosis not present

## 2017-07-12 DIAGNOSIS — M25561 Pain in right knee: Secondary | ICD-10-CM | POA: Diagnosis not present

## 2017-07-12 DIAGNOSIS — Z79899 Other long term (current) drug therapy: Secondary | ICD-10-CM | POA: Diagnosis not present

## 2017-07-19 DIAGNOSIS — E559 Vitamin D deficiency, unspecified: Secondary | ICD-10-CM | POA: Diagnosis not present

## 2017-07-19 DIAGNOSIS — M329 Systemic lupus erythematosus, unspecified: Secondary | ICD-10-CM | POA: Diagnosis not present

## 2017-07-19 DIAGNOSIS — R946 Abnormal results of thyroid function studies: Secondary | ICD-10-CM | POA: Diagnosis not present

## 2017-07-19 DIAGNOSIS — I10 Essential (primary) hypertension: Secondary | ICD-10-CM | POA: Diagnosis not present

## 2017-07-26 DIAGNOSIS — Z0000 Encounter for general adult medical examination without abnormal findings: Secondary | ICD-10-CM | POA: Diagnosis not present

## 2017-07-26 DIAGNOSIS — Z87448 Personal history of other diseases of urinary system: Secondary | ICD-10-CM | POA: Diagnosis not present

## 2017-07-26 DIAGNOSIS — M329 Systemic lupus erythematosus, unspecified: Secondary | ICD-10-CM | POA: Diagnosis not present

## 2017-07-26 DIAGNOSIS — E559 Vitamin D deficiency, unspecified: Secondary | ICD-10-CM | POA: Diagnosis not present

## 2017-07-26 DIAGNOSIS — Z79899 Other long term (current) drug therapy: Secondary | ICD-10-CM | POA: Diagnosis not present

## 2017-07-26 DIAGNOSIS — M797 Fibromyalgia: Secondary | ICD-10-CM | POA: Diagnosis not present

## 2017-08-07 ENCOUNTER — Telehealth: Payer: Self-pay | Admitting: Neurology

## 2017-08-07 NOTE — Telephone Encounter (Signed)
Patient returning a call but states since Ajovy injection her migraines are much better and has helped alot.

## 2017-08-07 NOTE — Telephone Encounter (Signed)
Noted thanks °

## 2017-08-07 NOTE — Telephone Encounter (Signed)
Completed Ajovy PA on [employer] for 675 mg every 90 days. Key: [id number]  Anticipate determination within 72 hours.

## 2017-08-08 NOTE — Telephone Encounter (Signed)
Received fax that Marie Peterson is a plan exclusion and is not a covered benefit on this patient's plan.   Patient was given copay card in the office last time she was here. She will still be able to get the medication free through the end of the year.

## 2017-08-10 ENCOUNTER — Other Ambulatory Visit: Payer: Self-pay | Admitting: Gynecology

## 2017-08-10 DIAGNOSIS — Z1231 Encounter for screening mammogram for malignant neoplasm of breast: Secondary | ICD-10-CM

## 2017-08-16 NOTE — Telephone Encounter (Signed)
Spoke with patient. She is aware Ajovy was not covered by her insurance. She was able to get the next dose (3 syringes) for $0 with the copay card. Next injection due on 08/21/17. Patient would like to see what Dr. Jaynee Eagles thinks as far as continuing medication vs switching to an insurance preferred CGRP. She is having positive results so far. She reports a decrease in the frequency and severity of her migraines. She also would like to come into the office to have her injections done. She tolerated the first injections well. Pt aware RN will call her back. She verbalized appreciation.

## 2017-08-16 NOTE — Telephone Encounter (Addendum)
Spoke with Dr. Jaynee Eagles. Will continue current plan since Ajovy is working for her and she has the copay card. Will consider appeal.   Spoke with pt. Discussed continuing Ajovy for now due to the reasons above- pt has copay card and it is working for her. She verbalized understanding. Scheduled pt for nurse injection visit on 5/28 Tuesday @ 10:00 AM.

## 2017-08-16 NOTE — Telephone Encounter (Signed)
Pt has called stating she is responding to a call she received from [employer] re: scheduling  Her Ajovy injection.  Pt is asking for a call back

## 2017-08-21 ENCOUNTER — Ambulatory Visit (INDEPENDENT_AMBULATORY_CARE_PROVIDER_SITE_OTHER): Payer: 59 | Admitting: *Deleted

## 2017-08-21 DIAGNOSIS — G43009 Migraine without aura, not intractable, without status migrainosus: Secondary | ICD-10-CM

## 2017-08-21 DIAGNOSIS — Z79899 Other long term (current) drug therapy: Secondary | ICD-10-CM

## 2017-08-21 NOTE — Progress Notes (Signed)
Patient given her 3 scheduled injections of Ajovy 225 mg each. Total 675 mg. She brought these from home. They were administered in 3 separate areas in her LUQ of abdomen. Pt tolerated well, no bleeding noted. Next injections due on 8.28.19.

## 2017-08-23 ENCOUNTER — Encounter: Payer: Self-pay | Admitting: *Deleted

## 2017-08-23 NOTE — Telephone Encounter (Signed)
Appeal letter for [employer] written. Signed by Dr. Jaynee Eagles and ready for [facility]. Faxed with office note to [employer]. Received a receipt of confirmation.

## 2017-08-24 ENCOUNTER — Other Ambulatory Visit: Payer: Self-pay | Admitting: Gynecology

## 2017-08-27 DIAGNOSIS — Z87448 Personal history of other diseases of urinary system: Secondary | ICD-10-CM | POA: Diagnosis not present

## 2017-08-27 DIAGNOSIS — I10 Essential (primary) hypertension: Secondary | ICD-10-CM | POA: Diagnosis not present

## 2017-08-27 DIAGNOSIS — M329 Systemic lupus erythematosus, unspecified: Secondary | ICD-10-CM | POA: Diagnosis not present

## 2017-09-04 ENCOUNTER — Ambulatory Visit
Admission: RE | Admit: 2017-09-04 | Discharge: 2017-09-04 | Disposition: A | Discharge status: Home / Self Care | Payer: 59 | Source: Ambulatory Visit | Attending: Gynecology | Admitting: Gynecology

## 2017-09-04 DIAGNOSIS — Z1231 Encounter for screening mammogram for malignant neoplasm of breast: Secondary | ICD-10-CM

## 2017-09-18 DIAGNOSIS — R202 Paresthesia of skin: Secondary | ICD-10-CM | POA: Diagnosis not present

## 2017-09-18 DIAGNOSIS — G4700 Insomnia, unspecified: Secondary | ICD-10-CM | POA: Diagnosis not present

## 2017-09-18 DIAGNOSIS — R5383 Other fatigue: Secondary | ICD-10-CM | POA: Diagnosis not present

## 2017-10-02 ENCOUNTER — Encounter: Payer: Self-pay | Admitting: Neurology

## 2017-10-04 ENCOUNTER — Ambulatory Visit (INDEPENDENT_AMBULATORY_CARE_PROVIDER_SITE_OTHER): Payer: 59 | Admitting: Gynecology

## 2017-10-04 ENCOUNTER — Encounter: Payer: Self-pay | Admitting: Gynecology

## 2017-10-04 VITALS — BP 118/72 | Ht 64.0 in | Wt 173.0 lb

## 2017-10-04 DIAGNOSIS — N951 Menopausal and female climacteric states: Secondary | ICD-10-CM | POA: Diagnosis not present

## 2017-10-04 DIAGNOSIS — M8580 Other specified disorders of bone density and structure, unspecified site: Secondary | ICD-10-CM

## 2017-10-04 DIAGNOSIS — Z01419 Encounter for gynecological examination (general) (routine) without abnormal findings: Secondary | ICD-10-CM

## 2017-10-04 DIAGNOSIS — N952 Postmenopausal atrophic vaginitis: Secondary | ICD-10-CM

## 2017-10-04 MED ORDER — ALENDRONATE SODIUM 70 MG PO TABS: ORAL_TABLET | ORAL | 12 refills | Status: DC

## 2017-10-04 NOTE — Progress Notes (Signed)
    CARALINA Peterson Mar 16, 1966 [id number]        52 y.o.  G2P2002 for annual gynecologic exam.  She notes of the past several months having hot flushes throughout the day.  No significant night sweats.  She had these when she initially went through menopause but they resolved but seem to have returned.  No bleeding.  No vaginal dryness.  Past medical history,surgical history, problem list, medications, allergies, family history and social history were all reviewed and documented as reviewed in the EPIC chart.  ROS:  Performed with pertinent positives and negatives included in the history, assessment and plan.   Additional significant findings : None   Exam: Marie Peterson assistant Vitals:   10/04/17 0926  BP: 118/72  Weight: 173 lb (78.5 kg)  Height: 5\' 4"  (1.626 m)   Body mass index is 29.7 kg/m.  General appearance:  Normal affect, orientation and appearance. Skin: Grossly normal HEENT: Without gross lesions.  No cervical or supraclavicular adenopathy. Thyroid normal.  Lungs:  Clear without wheezing, rales or rhonchi Cardiac: RR, without RMG Abdominal:  Soft, nontender, without masses, guarding, rebound, organomegaly or hernia Breasts:  Examined lying and sitting without masses, retractions, discharge or axillary adenopathy. Pelvic:  Ext, BUS, Vagina: Normal with atrophic changes  Cervix: With atrophic changes.  Flush with upper vagina.  Pap smear done.  Uterus: Small difficult to palpate  Adnexa: Without masses or tenderness    Anus and perineum: Normal   Rectovaginal: Normal sphincter tone without palpated masses or tenderness.    Assessment/Plan:  52 y.o. G91P2002 female for annual gynecologic exam.  1. Menopausal symptoms.  Having hot flushes daily.  No bleeding.  Discussed hormonal fluctuations.  Options for management to include trial of OTC products versus HRT discussed.  Risks versus benefits of HRT reviewed to include thrombosis such as stroke heart attack DVT in the  breast cancer issue.  She is being followed for lupus and possible increased risk of thrombosis associated with this.  This point the patient wants to try OTC products.  She will follow-up if this continues to be a significant issue. 2. Osteopenia.  DEXA 2017 T score -2.3.  Was started on alendronate 2017 due to worsening osteopenia and ongoing daily prednisone.  Doing well with this.  Reports recent renal functions normal.  Will continue on alendronate for now and refill x1 year provided.  Repeat bone density now at 2-year interval and she will schedule in follow-up for this. 3. Mammography 08/2017.  Continue with annual mammography when due.  Breast exam normal today. 4. Colonoscopy 2017.  Repeat at their recommended interval. 5. Pap smear 2018.  Pap smear done today.  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  Will continue with annual cytology for now due to immunosuppression. 6. Health maintenance.  No routine lab work done as she has a lab work done elsewhere.  Follow-up for bone density.  Follow-up 1 year for annual exam.  Anastasio Auerbach MD, 10:02 AM 10/04/2017

## 2017-10-04 NOTE — Patient Instructions (Signed)
Follow-up for the bone density as scheduled. 

## 2017-10-04 NOTE — Addendum Note (Signed)
Addended by: Nelva Nay on: 10/04/2017 10:09 AM   Modules accepted: Orders

## 2017-10-05 LAB — PAP IG W/ RFLX HPV ASCU

## 2017-10-09 ENCOUNTER — Encounter: Payer: Self-pay | Admitting: Neurology

## 2017-10-11 NOTE — Telephone Encounter (Signed)
Called [facility] regarding Ajovy appeal determination. Spoke with Gerald Stabs.  He stated that they do not handle the appeals and it would have to be faxed to [employer]. This information was not relayed to our office. New Fax # [phone].

## 2017-10-11 NOTE — Telephone Encounter (Signed)
Spoke with pt who stated she is still continuing to have positive results from the [place] and really wasn't having any headaches.   The severely hot weather has caused her to have some headaches so she tries to stay inside.   She is aware that appeal will have to be sent to [facility] instead of [facility].

## 2017-10-16 ENCOUNTER — Encounter: Payer: Self-pay | Admitting: *Deleted

## 2017-10-16 NOTE — Telephone Encounter (Signed)
Revised appeal letter to reflect that pt has now been on Ajovy 5 months and has continued to have major improvement in her migraines. Letter signed and faxed to [facility] with office note. Received a receipt of confirmation.

## 2017-10-18 ENCOUNTER — Other Ambulatory Visit: Payer: Self-pay | Admitting: Gynecology

## 2017-10-18 ENCOUNTER — Ambulatory Visit (INDEPENDENT_AMBULATORY_CARE_PROVIDER_SITE_OTHER): Payer: 59

## 2017-10-18 DIAGNOSIS — M8589 Other specified disorders of bone density and structure, multiple sites: Secondary | ICD-10-CM | POA: Diagnosis not present

## 2017-10-18 DIAGNOSIS — Z13820 Encounter for screening for osteoporosis: Secondary | ICD-10-CM

## 2017-10-18 DIAGNOSIS — M8580 Other specified disorders of bone density and structure, unspecified site: Secondary | ICD-10-CM

## 2017-10-19 ENCOUNTER — Encounter: Payer: Self-pay | Admitting: Gynecology

## 2017-10-23 ENCOUNTER — Telehealth: Payer: Self-pay | Admitting: Cardiovascular Disease

## 2017-10-23 MED ORDER — POTASSIUM CHLORIDE CRYS ER 20 MEQ PO TBCR: 20.0000 meq | EXTENDED_RELEASE_TABLET | Freq: Every day | ORAL | 3 refills | Status: DC

## 2017-10-23 NOTE — Telephone Encounter (Signed)
Rx request sent to pharmacy.  

## 2017-10-24 DIAGNOSIS — R350 Frequency of micturition: Secondary | ICD-10-CM | POA: Diagnosis not present

## 2017-10-24 DIAGNOSIS — R351 Nocturia: Secondary | ICD-10-CM | POA: Diagnosis not present

## 2017-10-25 ENCOUNTER — Other Ambulatory Visit: Payer: Self-pay | Admitting: Neurology

## 2017-10-25 ENCOUNTER — Encounter: Payer: Self-pay | Admitting: Anesthesiology

## 2017-10-30 NOTE — Telephone Encounter (Addendum)
Called [employer] to check status on Ajovy appeal. Spoke with Marie Peterson who stated they never received an appeal. RN advised this was sent with receipt of confirmation on 10/16/17. A new expedited appeal fax number was given. The appeal letter and office note was sent to [phone] with request to expedite this review. No connection received. Called [employer]. Faxed to a different number with expedited review request [phone]. Received a receipt of confirmation.

## 2017-11-06 ENCOUNTER — Telehealth: Payer: Self-pay | Admitting: Neurology

## 2017-11-06 NOTE — Telephone Encounter (Signed)
Called [facility] and spoke with representative John to inquire about appeal. Was transferred to Farley Ly. who stated that the appeal was overturned and approved. She will fax the approval to office. Transferred to [facility] and transferred to [facility] to confirm that the denial was changed to approved and they confirmed  And gave approval # [id number] CEU. Approval dates 09/06/17 through 12/07/17 for Ajovy 675 mg every 3 months.   Appeal Case # [id number] PA denial was # [id number]- [id number]  [facility] and informed them of the approval.

## 2017-11-06 NOTE — Telephone Encounter (Signed)
Jody with [employer] calling regarding pts PA for Ajovy. Call back number [phone] GRJ [id number]

## 2017-11-06 NOTE — Telephone Encounter (Signed)
Spoke with Sheral Flow at [facility] [employer] and informed her that we are currently awaiting a determination from the plan regarding an appeal that was sent. She verbalized understanding and appreciation for the update and she will update the information on their end and remove the new PA request.

## 2017-11-12 DIAGNOSIS — H04123 Dry eye syndrome of bilateral lacrimal glands: Secondary | ICD-10-CM | POA: Diagnosis not present

## 2017-11-12 DIAGNOSIS — Z79899 Other long term (current) drug therapy: Secondary | ICD-10-CM | POA: Diagnosis not present

## 2017-11-12 DIAGNOSIS — M329 Systemic lupus erythematosus, unspecified: Secondary | ICD-10-CM | POA: Diagnosis not present

## 2017-11-12 DIAGNOSIS — H1013 Acute atopic conjunctivitis, bilateral: Secondary | ICD-10-CM | POA: Diagnosis not present

## 2017-11-21 ENCOUNTER — Ambulatory Visit (INDEPENDENT_AMBULATORY_CARE_PROVIDER_SITE_OTHER): Payer: 59 | Admitting: *Deleted

## 2017-11-21 DIAGNOSIS — G43009 Migraine without aura, not intractable, without status migrainosus: Secondary | ICD-10-CM | POA: Diagnosis not present

## 2017-11-21 DIAGNOSIS — Z79899 Other long term (current) drug therapy: Secondary | ICD-10-CM

## 2017-11-21 NOTE — Progress Notes (Signed)
Patient given her 3 scheduled injections of Ajovy 225 mg each. Total 675 mg. She brought these from home. They were administered in 3 separate areas in her RUQ of abdomen. Pt tolerated well, bandaids applied and aseptic technique used. Next injections due on 11.28.19. Discussed pt coming in a day or two early since the 28th is on Thanksgiving and our office will be closed. She will schedule her next injection.  LOT # on one box unable to read LOT # on other two boxes is [id number] EXP# [id number]

## 2017-11-23 DIAGNOSIS — Z5181 Encounter for therapeutic drug level monitoring: Secondary | ICD-10-CM | POA: Diagnosis not present

## 2017-11-23 DIAGNOSIS — Z79899 Other long term (current) drug therapy: Secondary | ICD-10-CM | POA: Diagnosis not present

## 2017-12-15 ENCOUNTER — Other Ambulatory Visit: Payer: Self-pay | Admitting: Cardiovascular Disease

## 2017-12-25 DIAGNOSIS — M25561 Pain in right knee: Secondary | ICD-10-CM | POA: Diagnosis not present

## 2017-12-25 DIAGNOSIS — M810 Age-related osteoporosis without current pathological fracture: Secondary | ICD-10-CM | POA: Diagnosis not present

## 2017-12-25 DIAGNOSIS — M797 Fibromyalgia: Secondary | ICD-10-CM | POA: Diagnosis not present

## 2017-12-25 DIAGNOSIS — M329 Systemic lupus erythematosus, unspecified: Secondary | ICD-10-CM | POA: Diagnosis not present

## 2017-12-25 DIAGNOSIS — Z79899 Other long term (current) drug therapy: Secondary | ICD-10-CM | POA: Diagnosis not present

## 2017-12-25 DIAGNOSIS — M1990 Unspecified osteoarthritis, unspecified site: Secondary | ICD-10-CM | POA: Diagnosis not present

## 2018-02-06 ENCOUNTER — Encounter: Payer: Self-pay | Admitting: *Deleted

## 2018-02-06 ENCOUNTER — Telehealth: Payer: Self-pay | Admitting: *Deleted

## 2018-02-06 NOTE — Telephone Encounter (Signed)
Called [employer]. Previous appeal overturned and approved until 12/07/17. We would like to get this approved again. Pt has been taking since 05/24/2017 and doing well. They asked we send another appeal letter to get approved again. They provided me fax: [phone]. Faxed signed letter to appeals department. Waiting on determination.

## 2018-02-12 DIAGNOSIS — M329 Systemic lupus erythematosus, unspecified: Secondary | ICD-10-CM | POA: Diagnosis not present

## 2018-02-12 DIAGNOSIS — Z79899 Other long term (current) drug therapy: Secondary | ICD-10-CM | POA: Diagnosis not present

## 2018-02-12 DIAGNOSIS — H04123 Dry eye syndrome of bilateral lacrimal glands: Secondary | ICD-10-CM | POA: Diagnosis not present

## 2018-02-19 ENCOUNTER — Other Ambulatory Visit: Payer: Self-pay | Admitting: Cardiovascular Disease

## 2018-02-19 DIAGNOSIS — Z0000 Encounter for general adult medical examination without abnormal findings: Secondary | ICD-10-CM | POA: Diagnosis not present

## 2018-02-19 DIAGNOSIS — E559 Vitamin D deficiency, unspecified: Secondary | ICD-10-CM | POA: Diagnosis not present

## 2018-02-19 DIAGNOSIS — M329 Systemic lupus erythematosus, unspecified: Secondary | ICD-10-CM | POA: Diagnosis not present

## 2018-02-19 NOTE — Telephone Encounter (Signed)
Rx(s) sent to pharmacy electronically.  

## 2018-02-20 ENCOUNTER — Encounter: Payer: Self-pay | Admitting: *Deleted

## 2018-02-20 ENCOUNTER — Ambulatory Visit (INDEPENDENT_AMBULATORY_CARE_PROVIDER_SITE_OTHER): Payer: 59 | Admitting: *Deleted

## 2018-02-20 DIAGNOSIS — Z79899 Other long term (current) drug therapy: Secondary | ICD-10-CM

## 2018-02-20 DIAGNOSIS — G43009 Migraine without aura, not intractable, without status migrainosus: Secondary | ICD-10-CM | POA: Diagnosis not present

## 2018-02-20 DIAGNOSIS — Z741 Need for assistance with personal care: Secondary | ICD-10-CM

## 2018-02-20 NOTE — Progress Notes (Signed)
Patient given her 3 scheduled injections of Ajovy 225 mg each. Total 675 mg. She brought these from home. They were administered in 3 separate areas in her LUQ of abdomen. Pt tolerated well, bandaids applied and aseptic technique used. Next injections due on 02.27.20. She will schedule her next injection.  LOT # for all 3: [id number] EXP # for all 3: [id number] NDC: 83094-076-80

## 2018-02-26 ENCOUNTER — Encounter: Payer: Self-pay | Admitting: *Deleted

## 2018-02-26 NOTE — Telephone Encounter (Signed)
Received determination from [facility]. Arie Sabina has been APPROVED through February 15, 2019 (or until medication becomes subject to a pharmacy benefit coverage requirement such as supply limits or notification, whichever comes first).   Messaged pt through [employer].

## 2018-02-28 DIAGNOSIS — Z23 Encounter for immunization: Secondary | ICD-10-CM | POA: Diagnosis not present

## 2018-02-28 DIAGNOSIS — I10 Essential (primary) hypertension: Secondary | ICD-10-CM | POA: Diagnosis not present

## 2018-02-28 DIAGNOSIS — M797 Fibromyalgia: Secondary | ICD-10-CM | POA: Diagnosis not present

## 2018-02-28 DIAGNOSIS — Z0000 Encounter for general adult medical examination without abnormal findings: Secondary | ICD-10-CM | POA: Diagnosis not present

## 2018-03-19 ENCOUNTER — Ambulatory Visit: Payer: 59 | Admitting: Cardiovascular Disease

## 2018-03-21 ENCOUNTER — Other Ambulatory Visit: Payer: Self-pay | Admitting: Cardiovascular Disease

## 2018-03-21 ENCOUNTER — Other Ambulatory Visit: Payer: Self-pay | Admitting: Neurology

## 2018-03-22 ENCOUNTER — Encounter: Payer: Self-pay | Admitting: *Deleted

## 2018-03-27 DIAGNOSIS — M5030 Other cervical disc degeneration, unspecified cervical region: Secondary | ICD-10-CM

## 2018-03-27 HISTORY — PX: OTHER SURGICAL HISTORY: SHX169

## 2018-03-27 HISTORY — DX: Other cervical disc degeneration, unspecified cervical region: M50.30

## 2018-04-08 DIAGNOSIS — M542 Cervicalgia: Secondary | ICD-10-CM | POA: Diagnosis not present

## 2018-04-08 DIAGNOSIS — M545 Low back pain: Secondary | ICD-10-CM | POA: Diagnosis not present

## 2018-04-08 DIAGNOSIS — M25569 Pain in unspecified knee: Secondary | ICD-10-CM | POA: Diagnosis not present

## 2018-04-08 DIAGNOSIS — G894 Chronic pain syndrome: Secondary | ICD-10-CM | POA: Diagnosis not present

## 2018-04-08 DIAGNOSIS — Z79899 Other long term (current) drug therapy: Secondary | ICD-10-CM | POA: Diagnosis not present

## 2018-04-08 DIAGNOSIS — Z79891 Long term (current) use of opiate analgesic: Secondary | ICD-10-CM | POA: Diagnosis not present

## 2018-04-11 ENCOUNTER — Other Ambulatory Visit: Payer: Self-pay | Admitting: Pain Medicine

## 2018-04-11 ENCOUNTER — Ambulatory Visit
Admission: RE | Admit: 2018-04-11 | Discharge: 2018-04-11 | Disposition: A | Discharge status: Home / Self Care | Payer: 59 | Source: Ambulatory Visit | Attending: Pain Medicine | Admitting: Pain Medicine

## 2018-04-11 DIAGNOSIS — M25561 Pain in right knee: Secondary | ICD-10-CM

## 2018-04-11 DIAGNOSIS — M545 Low back pain, unspecified: Secondary | ICD-10-CM

## 2018-04-11 DIAGNOSIS — M1612 Unilateral primary osteoarthritis, left hip: Secondary | ICD-10-CM | POA: Diagnosis not present

## 2018-04-11 DIAGNOSIS — M542 Cervicalgia: Secondary | ICD-10-CM

## 2018-04-11 DIAGNOSIS — M47817 Spondylosis without myelopathy or radiculopathy, lumbosacral region: Secondary | ICD-10-CM | POA: Diagnosis not present

## 2018-04-11 DIAGNOSIS — M5032 Other cervical disc degeneration, mid-cervical region, unspecified level: Secondary | ICD-10-CM | POA: Diagnosis not present

## 2018-04-11 DIAGNOSIS — M25562 Pain in left knee: Principal | ICD-10-CM

## 2018-04-11 DIAGNOSIS — M1611 Unilateral primary osteoarthritis, right hip: Secondary | ICD-10-CM | POA: Diagnosis not present

## 2018-04-11 IMAGING — CT CT MAXILLOFACIAL W/O CM
2 of 3 series · 15 of 37 positions shown, 18 images · non-contrast
Comparison: Brain MRI 01/05/2016, and a brain MRI 10/25/2010.

CLINICAL DATA: 50-year-old female with abnormal left frontal sinus
on MRI. Frontal pain. Initial encounter.

EXAM:
CT MAXILLOFACIAL WITHOUT CONTRAST
TECHNIQUE: Multidetector CT imaging of the maxillofacial structures was
performed. Multiplanar CT image reconstructions were also generated.
A small metallic BB was placed on the right temple in order to
reliably differentiate right from left.

[Series 601: coronal facial · coronal · 0.32mm/px · 3 of 80 slices shown]
[im 27/80  bone]
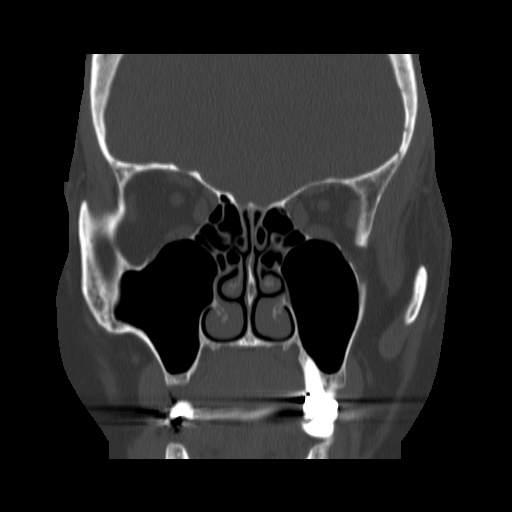
[im 40/80  bone]
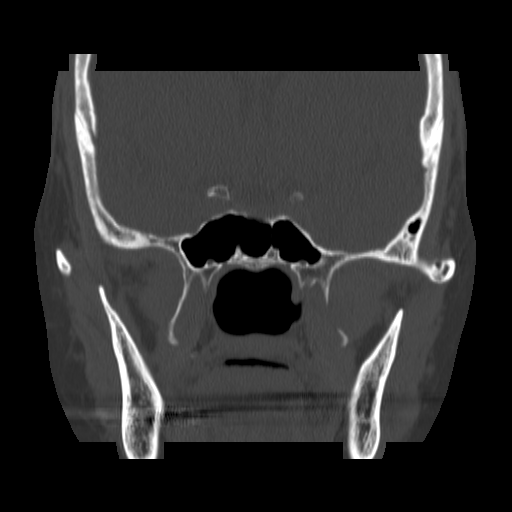
[im 53/80  bone]
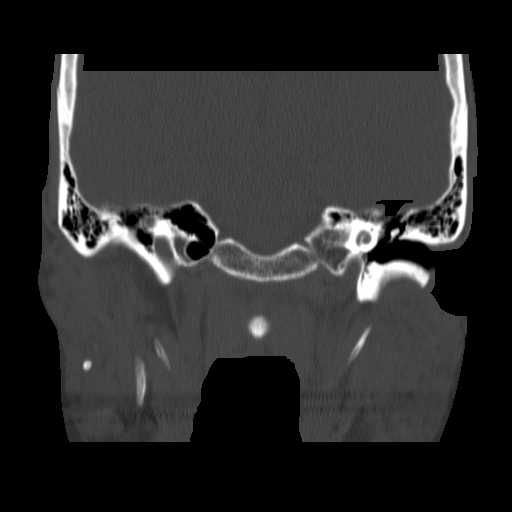

[Series 602: sagittal facial · sagittal · 0.32mm/px · 12 of 83 slices shown, 15 images]
[im 5/83  brain]
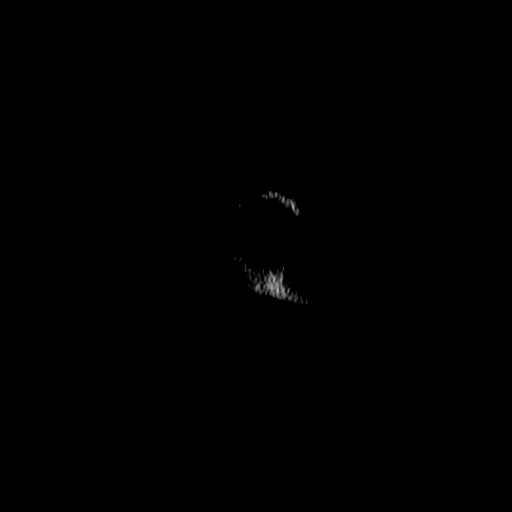
[im 5/83  bone]
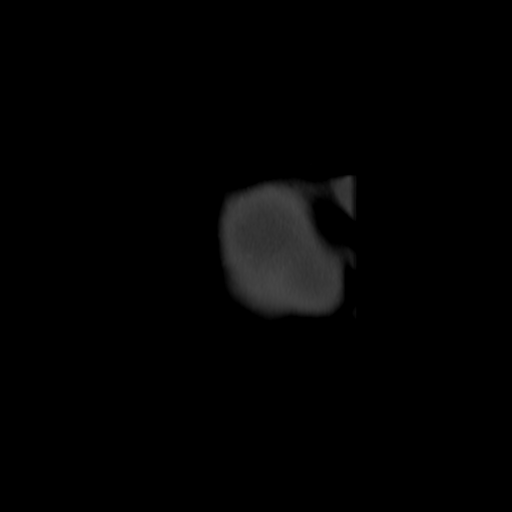
[im 15/83  bone]
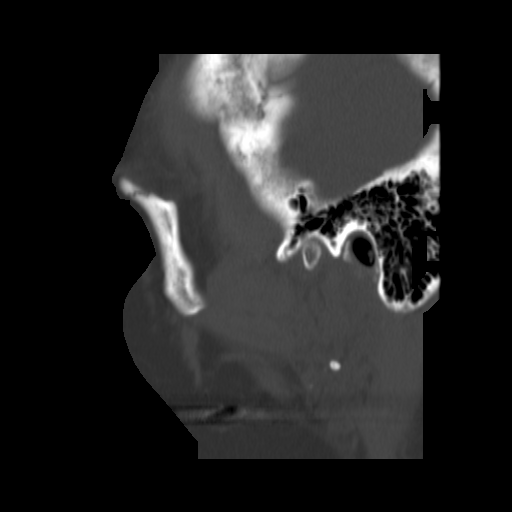
[im 20/83  bone]
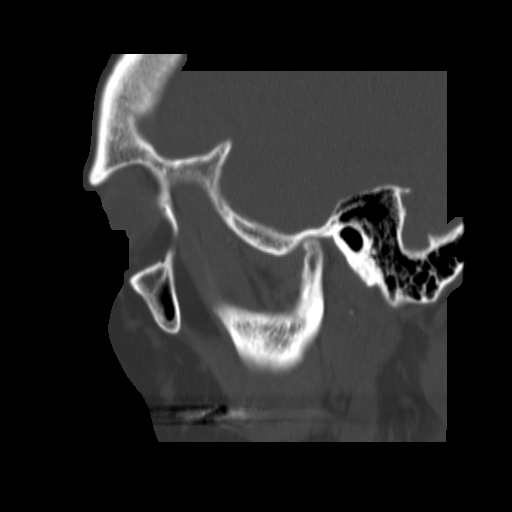
[im 25/83  bone]
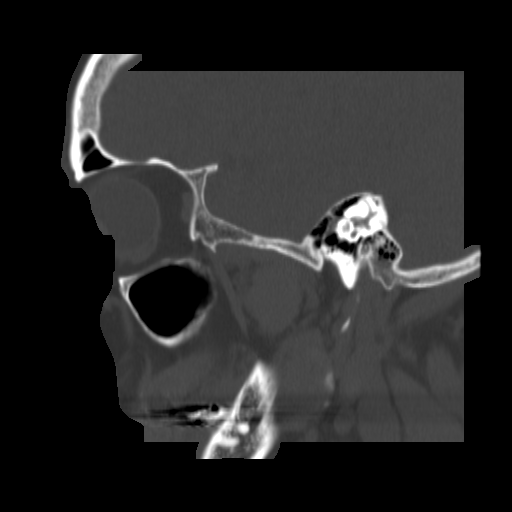
[im 34/83  brain]
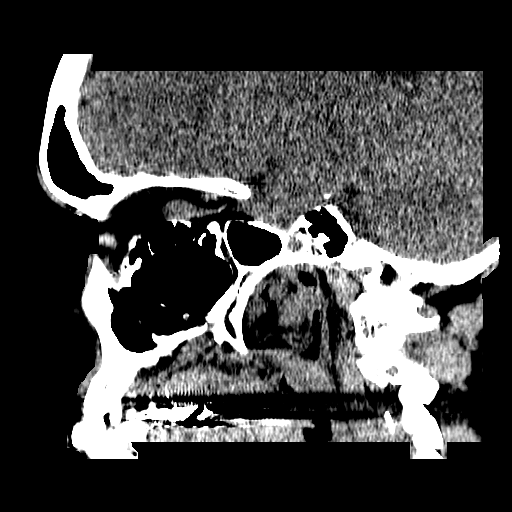
[im 34/83  bone]
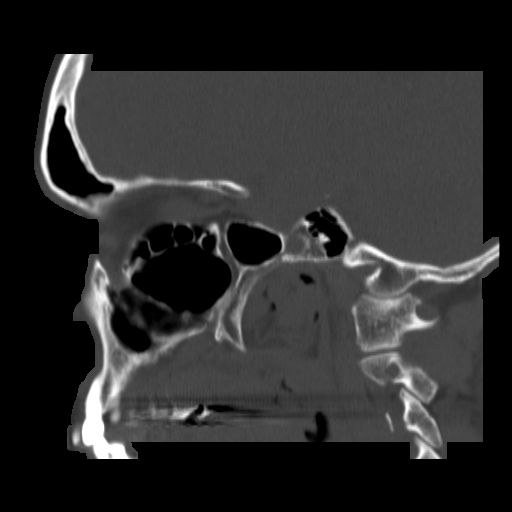
[im 39/83  bone]
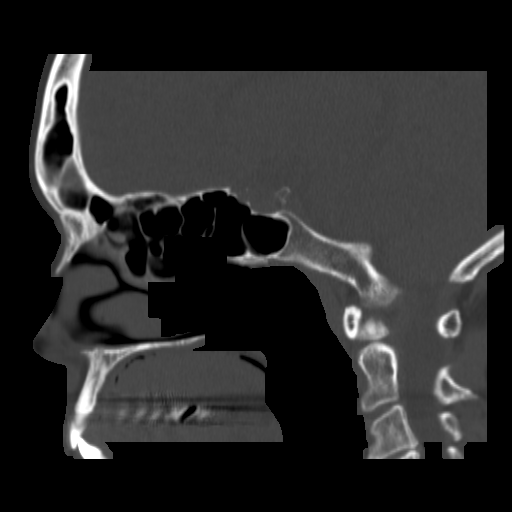
[im 44/83  bone]
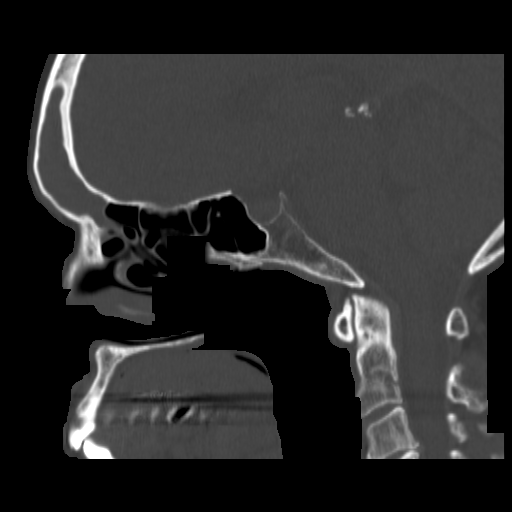
[im 54/83  bone]
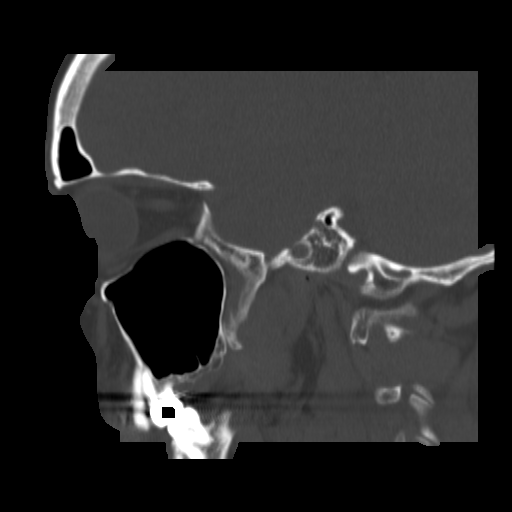
[im 58/83  brain]
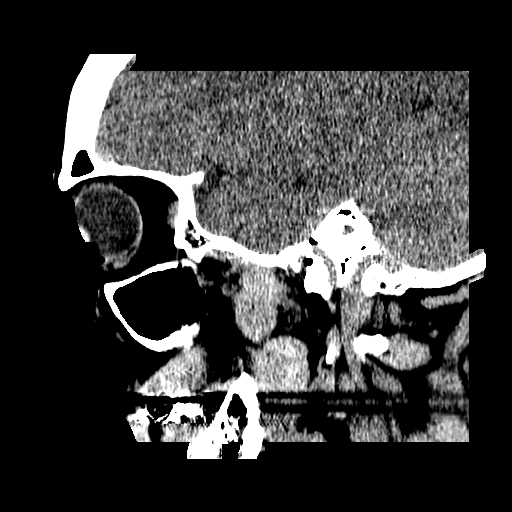
[im 58/83  bone]
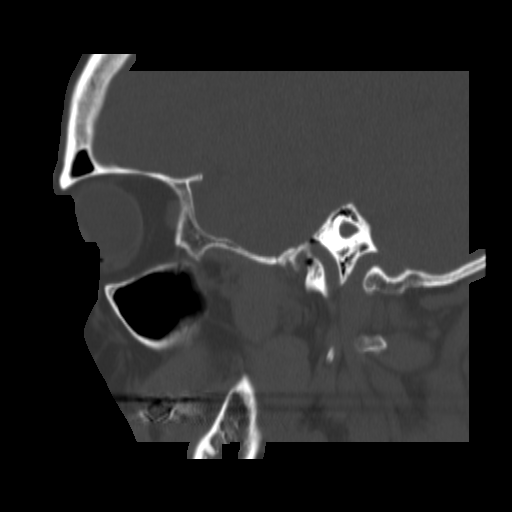
[im 63/83  bone]
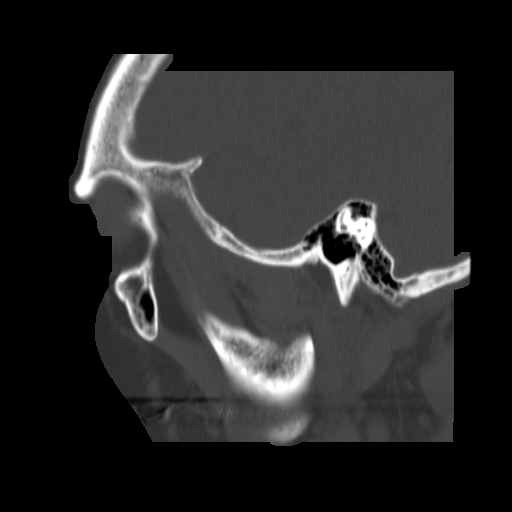
[im 73/83  bone]
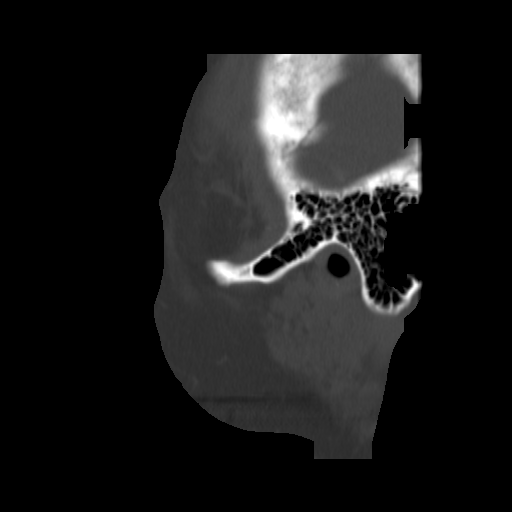
[im 78/83  bone]
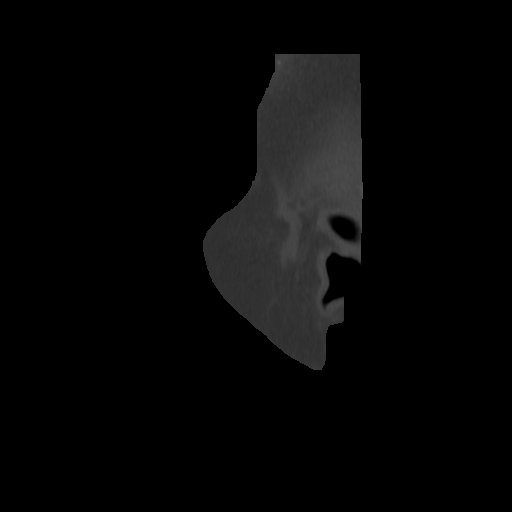

[15 of 37 positions shown; findings below may reference images not displayed]

FINDINGS: Osseous: Paranasal sinuses discussed below.

No acute osseous abnormality identified.

Orbits: Orbital walls are intact.  Negative orbits soft tissues.

Sinuses:

Except for the frontal in ethmoid sinuses described below, the
paranasal sinuses and mastoids are stable and well pneumatized.

There is partial but not complete opacification of the left frontal
sinus which appears unchanged from the recent MRI. The medial half
of the left frontal sinus is opacified along with the left
frontoethmoidal recess. However, the adjacent anterior left ethmoids
are clear. There is no sinus expansion. The material within the left
frontal sinus appears isodense to mildly hyperdense.

Opacification of an anterior right ethmoid air cell is unchanged
from the recent MRI. The right frontal sinus is clear.

The frontal and ethmoid sinuses were clear in 5695.

Soft tissues: Negative orbit and visualized scalp soft tissues.
Negative visualized noncontrast deep soft tissue spaces of the face
aside from scattered small calcifications in both parotid glands.

Limited intracranial: Negative visualized noncontrast brain
parenchyma.
IMPRESSION: 1. Left frontal sinus disease is stable and does not rise to the
level of a mucocele. The medial left frontal sinus an the left
frontoethmoidal recess are opacified. The sinus contents are mildly
hyperdense suggesting inspissated material. The appearance is NOT
suggestive of allergic fungal sinusitis.
2. Isolated opacification of a right anterior ethmoid air cell, also
stable from the recent MRI.
3. Other paranasal sinuses, tympanic cavities, and mastoids are
clear.

## 2018-04-15 IMAGING — US US EXTREM LOW VENOUS BILAT
1 series · 14 of 24 positions shown · non-contrast
Comparison: None.

CLINICAL DATA: Bilateral lower extremity edema for 1 month

EXAM:
BILATERAL LOWER EXTREMITY VENOUS DUPLEX ULTRASOUND
TECHNIQUE: Doppler venous assessment of the left lower extremity deep venous
system was performed, including characterization of spectral flow,
compressibility, and phasicity.

[Series 1: us extrem low venous bilat · 14 of 86 slices shown]
[im 1/86]
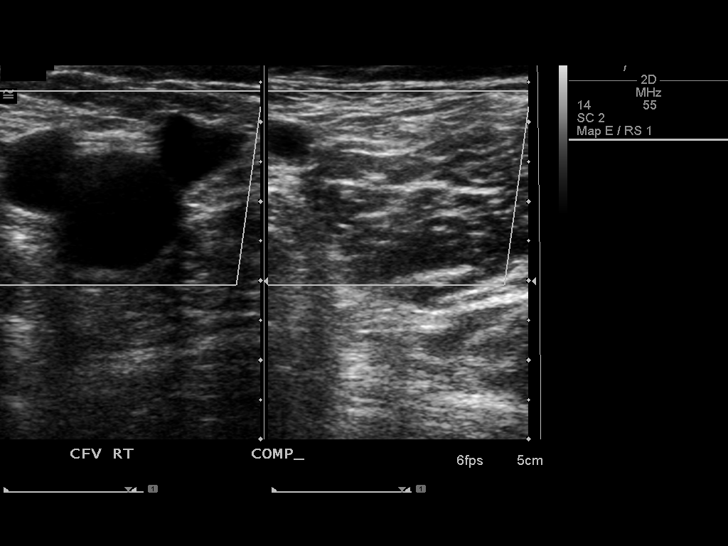
[im 8/86]
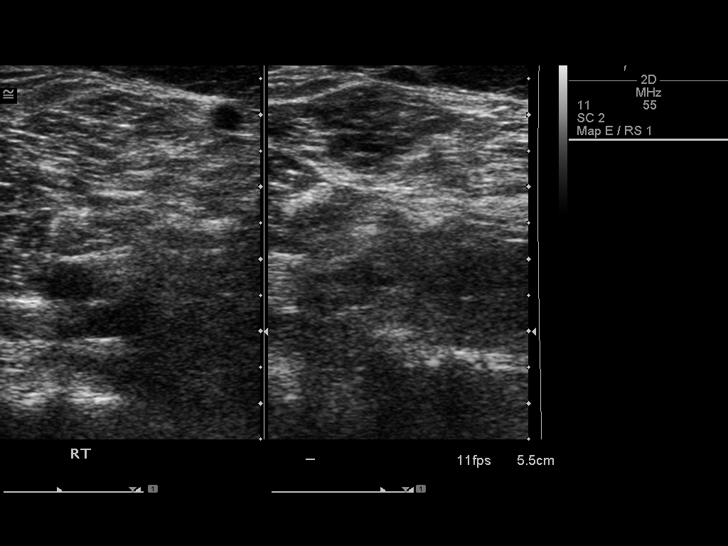
[im 15/86]
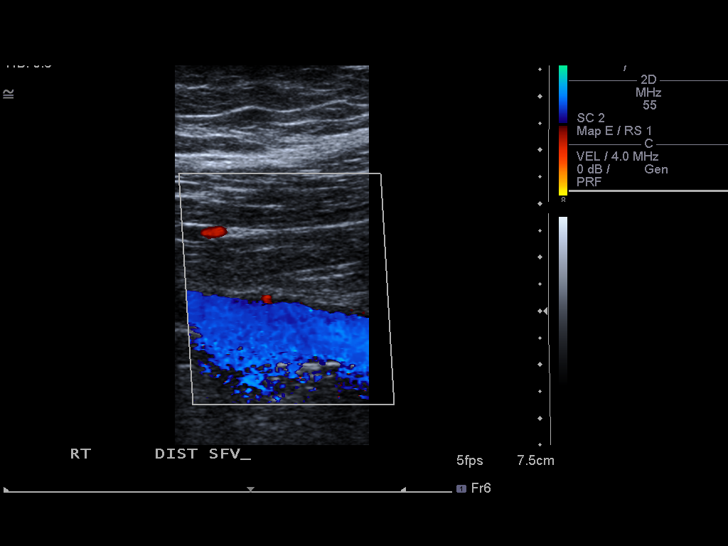
[im 23/86]
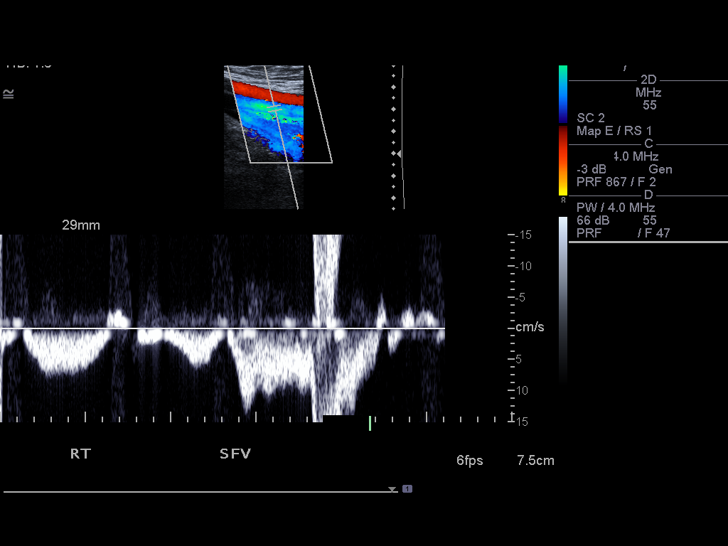
[im 26/86]
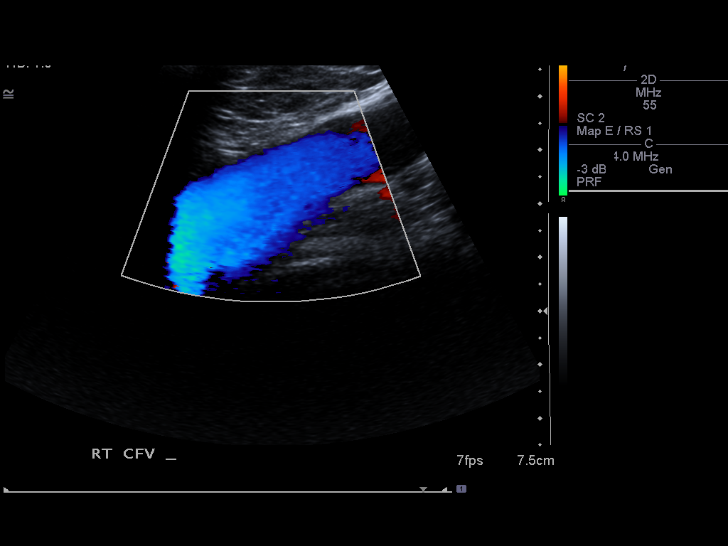
[im 34/86]
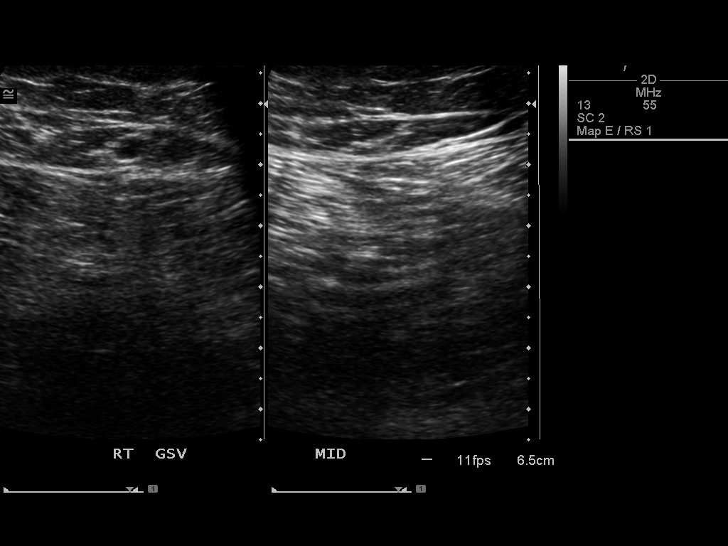
[im 41/86]
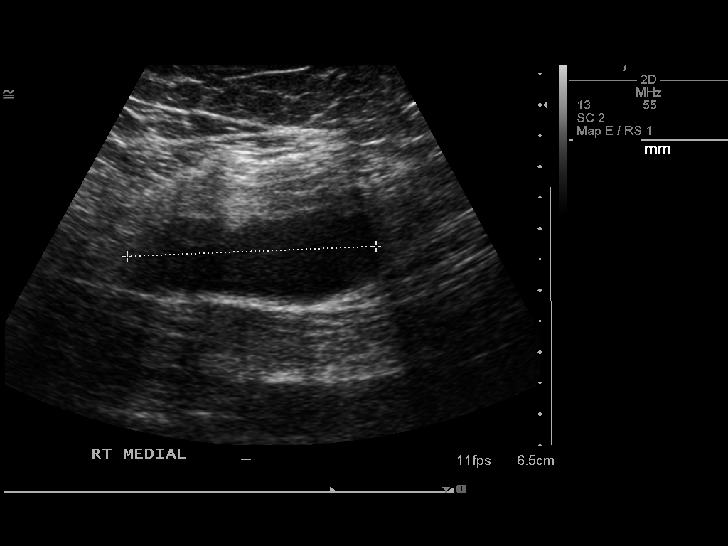
[im 45/86]
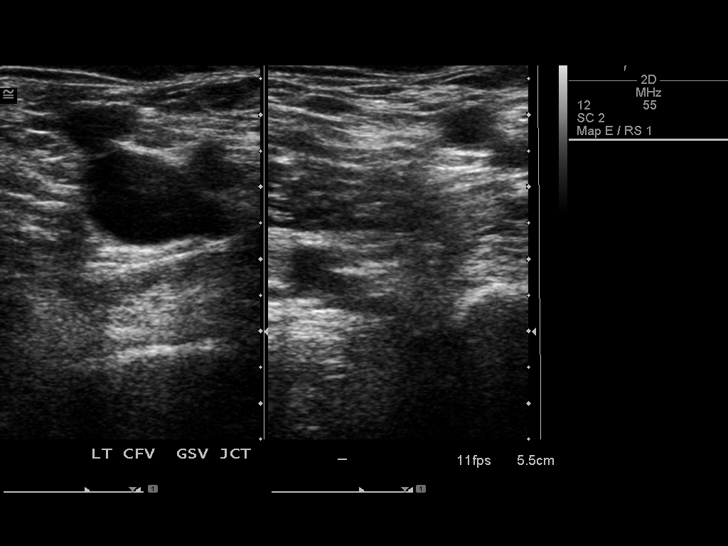
[im 52/86]
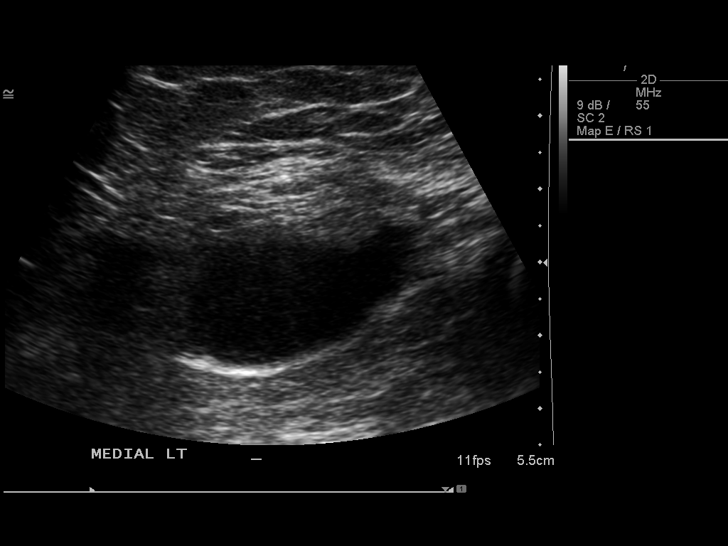
[im 60/86]
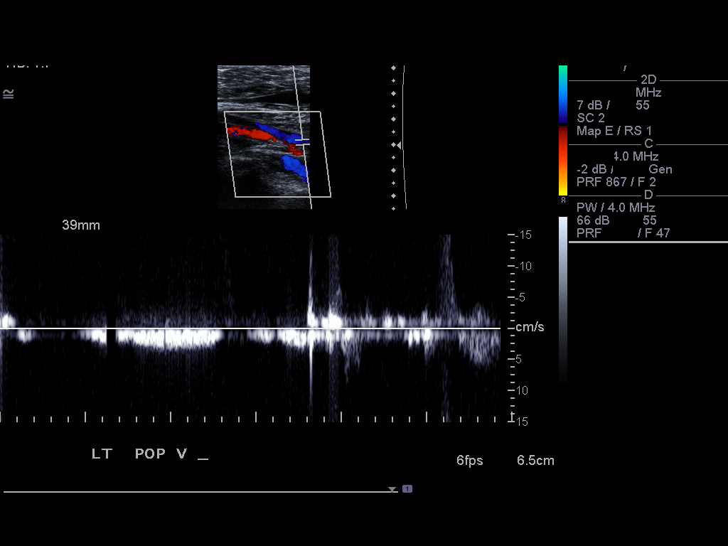
[im 67/86]
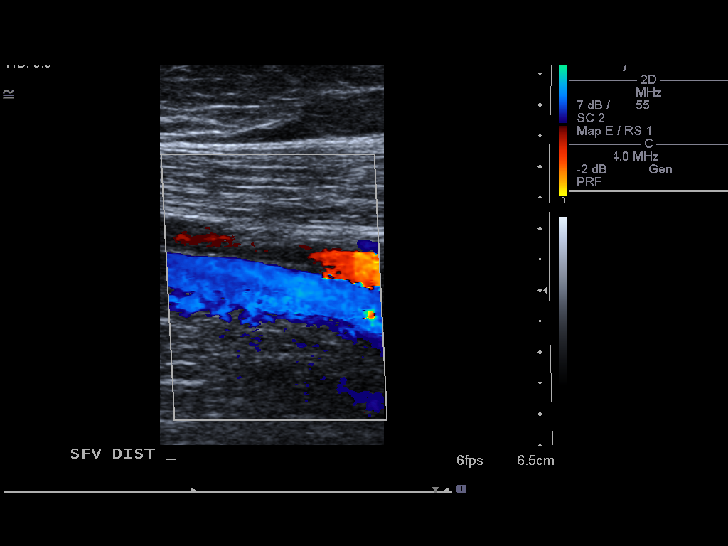
[im 71/86]
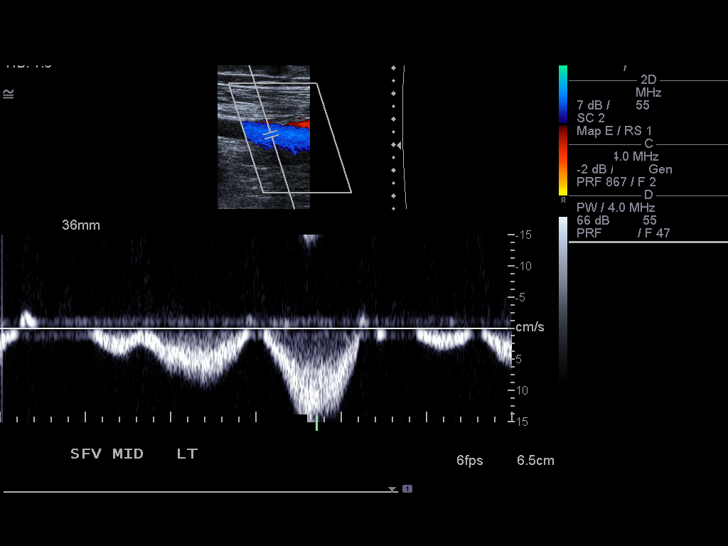
[im 78/86]
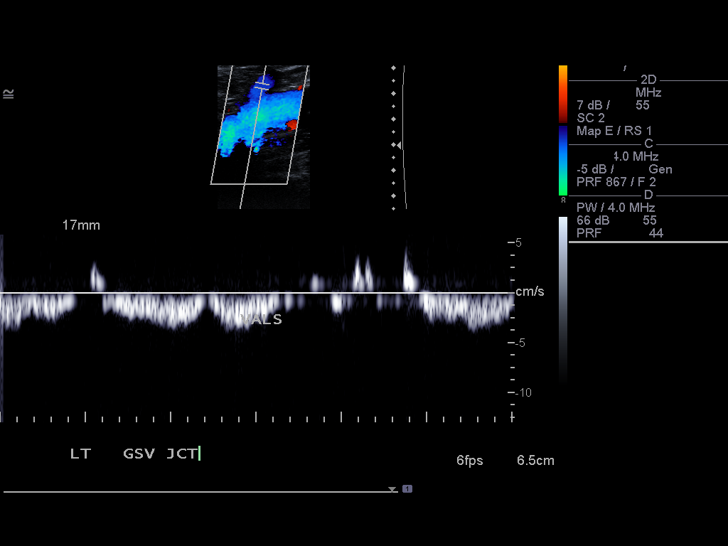
[im 86/86]
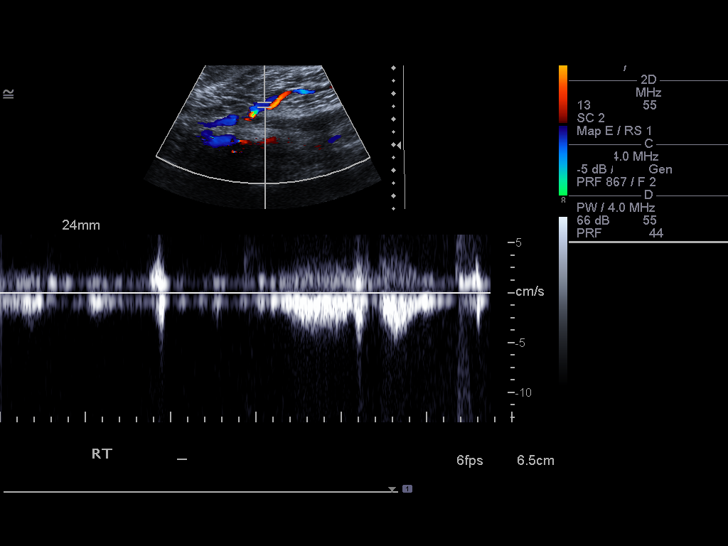

[14 of 24 positions shown; findings below may reference images not displayed]

FINDINGS: There is complete compressibility of the bile common femoral,
femoral, and popliteal veins. Doppler analysis demonstrates
respiratory phasicity and augmentation of flow with calf
compression. No obvious superficial vein or calf vein thrombosis.

There is a small fluid collection medial to the right knee,
measuring 5.4 x 2.6 x 4.0 cm.

There is a small fluid collection medial to left knee measuring
x 1.9 x 3.4 cm.
IMPRESSION: No evidence of lower extremity DVT.

There are fluid collections medial to both the right and left knee
as described. An inflammatory process is not excluded. Knee MRI may
be helpful.

## 2018-04-16 DIAGNOSIS — G894 Chronic pain syndrome: Secondary | ICD-10-CM | POA: Diagnosis not present

## 2018-04-16 DIAGNOSIS — F4542 Pain disorder with related psychological factors: Secondary | ICD-10-CM | POA: Diagnosis not present

## 2018-04-16 DIAGNOSIS — M792 Neuralgia and neuritis, unspecified: Secondary | ICD-10-CM | POA: Diagnosis not present

## 2018-04-16 DIAGNOSIS — G609 Hereditary and idiopathic neuropathy, unspecified: Secondary | ICD-10-CM | POA: Diagnosis not present

## 2018-04-21 ENCOUNTER — Other Ambulatory Visit: Payer: Self-pay | Admitting: Cardiovascular Disease

## 2018-04-22 NOTE — Telephone Encounter (Signed)
Please review for refill . Last Ov 09/2016

## 2018-04-23 NOTE — Telephone Encounter (Signed)
Rx(s) sent to pharmacy electronically.  

## 2018-04-30 ENCOUNTER — Ambulatory Visit: Payer: 59 | Admitting: Cardiovascular Disease

## 2018-04-30 ENCOUNTER — Institutional Professional Consult (permissible substitution): Payer: 59 | Admitting: Neurology

## 2018-05-06 DIAGNOSIS — G894 Chronic pain syndrome: Secondary | ICD-10-CM | POA: Diagnosis not present

## 2018-05-06 DIAGNOSIS — M5030 Other cervical disc degeneration, unspecified cervical region: Secondary | ICD-10-CM | POA: Diagnosis not present

## 2018-05-06 DIAGNOSIS — M25569 Pain in unspecified knee: Secondary | ICD-10-CM | POA: Diagnosis not present

## 2018-05-06 DIAGNOSIS — M47817 Spondylosis without myelopathy or radiculopathy, lumbosacral region: Secondary | ICD-10-CM | POA: Diagnosis not present

## 2018-05-15 DIAGNOSIS — M47812 Spondylosis without myelopathy or radiculopathy, cervical region: Secondary | ICD-10-CM | POA: Diagnosis not present

## 2018-05-20 ENCOUNTER — Other Ambulatory Visit: Payer: Self-pay | Admitting: Cardiovascular Disease

## 2018-05-23 ENCOUNTER — Ambulatory Visit (INDEPENDENT_AMBULATORY_CARE_PROVIDER_SITE_OTHER): Payer: 59 | Admitting: *Deleted

## 2018-05-23 DIAGNOSIS — G43009 Migraine without aura, not intractable, without status migrainosus: Secondary | ICD-10-CM | POA: Diagnosis not present

## 2018-05-23 DIAGNOSIS — Z741 Need for assistance with personal care: Secondary | ICD-10-CM

## 2018-05-23 NOTE — Progress Notes (Signed)
Patient given her 3 scheduled injections of Ajovy 225 mg each. Total 675 mg. She brought these from home. They were administered in 3 separate areas in herLUQ of abdomen. Pt tolerated well,bandaids appliedand aseptic technique used. Next injections due on05.27.20. She will schedule her next injection.  LOT # for all 3: [id number] EXP # for all 3: [id number] NDC: 11914-782-95

## 2018-05-25 ENCOUNTER — Other Ambulatory Visit: Payer: Self-pay | Admitting: Neurology

## 2018-05-30 DIAGNOSIS — I10 Essential (primary) hypertension: Secondary | ICD-10-CM | POA: Diagnosis not present

## 2018-05-30 DIAGNOSIS — G43909 Migraine, unspecified, not intractable, without status migrainosus: Secondary | ICD-10-CM | POA: Diagnosis not present

## 2018-05-30 DIAGNOSIS — M797 Fibromyalgia: Secondary | ICD-10-CM | POA: Diagnosis not present

## 2018-06-05 ENCOUNTER — Other Ambulatory Visit: Payer: Self-pay | Admitting: Cardiovascular Disease

## 2018-06-09 ENCOUNTER — Other Ambulatory Visit: Payer: Self-pay | Admitting: Cardiovascular Disease

## 2018-06-17 DIAGNOSIS — M47812 Spondylosis without myelopathy or radiculopathy, cervical region: Secondary | ICD-10-CM | POA: Diagnosis not present

## 2018-06-21 NOTE — Telephone Encounter (Signed)
Spoke with Dr. Jaynee Eagles. Appt is for new consult for neuropathy. Per Dr. Jaynee Eagles, will need to r/s for in-office visit at a later date. Currently severely limiting visits d/t [phone] pandemic.

## 2018-06-24 NOTE — Telephone Encounter (Signed)
Called patient no answer left voicemail of appt rescheduled for 06/16 advised pt to arrive at 8am for her 8:30appt if she needs to r/s farther out to give the office a call

## 2018-06-24 NOTE — Telephone Encounter (Signed)
Spoke with Dr. Jaynee Eagles. That's fine for patient to r/s to September. Will have a staff member call pt to r/s.

## 2018-06-24 NOTE — Telephone Encounter (Signed)
Pts appt canceled but unable to reschedule until september

## 2018-06-26 ENCOUNTER — Institutional Professional Consult (permissible substitution): Payer: 59 | Admitting: Neurology

## 2018-06-26 DIAGNOSIS — M25561 Pain in right knee: Secondary | ICD-10-CM | POA: Diagnosis not present

## 2018-06-26 DIAGNOSIS — H109 Unspecified conjunctivitis: Secondary | ICD-10-CM | POA: Diagnosis not present

## 2018-06-26 DIAGNOSIS — M1990 Unspecified osteoarthritis, unspecified site: Secondary | ICD-10-CM | POA: Diagnosis not present

## 2018-06-26 DIAGNOSIS — M329 Systemic lupus erythematosus, unspecified: Secondary | ICD-10-CM | POA: Diagnosis not present

## 2018-06-26 DIAGNOSIS — M797 Fibromyalgia: Secondary | ICD-10-CM | POA: Diagnosis not present

## 2018-06-26 DIAGNOSIS — M810 Age-related osteoporosis without current pathological fracture: Secondary | ICD-10-CM | POA: Diagnosis not present

## 2018-06-26 DIAGNOSIS — Z79899 Other long term (current) drug therapy: Secondary | ICD-10-CM | POA: Diagnosis not present

## 2018-07-01 ENCOUNTER — Telehealth: Payer: Self-pay | Admitting: *Deleted

## 2018-07-01 ENCOUNTER — Telehealth: Payer: Self-pay

## 2018-07-01 DIAGNOSIS — M47812 Spondylosis without myelopathy or radiculopathy, cervical region: Secondary | ICD-10-CM | POA: Diagnosis not present

## 2018-07-01 NOTE — Telephone Encounter (Signed)
Called and left a detailed message for the patient about her scheduled appointment for 07/02/18 at 9:15. Let her know in the message that we have made multiple attempts to reach her about switching to a telephone or video visit. Since we have made multiple attempts her appointment will be cancelled and that she can call our office to reschedule for an E-visit

## 2018-07-01 NOTE — Telephone Encounter (Signed)
Called patient.  No answer.

## 2018-07-02 ENCOUNTER — Ambulatory Visit: Payer: 59 | Admitting: Cardiovascular Disease

## 2018-07-04 ENCOUNTER — Telehealth: Payer: Self-pay

## 2018-07-04 NOTE — Telephone Encounter (Signed)
Follow up:   Patient returning call back concerning her appt. Please call patient.

## 2018-07-04 NOTE — Telephone Encounter (Signed)
Virtual Visit Pre-Appointment Phone Call  Steps For Call:  1. Confirm consent - "In the setting of the current Covid19 crisis, you are scheduled for a (phone or video) visit with your provider on (date) at (time).  Just as we do with many in-office visits, in order for you to participate in this visit, we must obtain consent.  If you'd like, I can send this to your mychart (if signed up) or email for you to review.  Otherwise, I can obtain your verbal consent now.  All virtual visits are billed to your insurance company just like a normal visit would be.  By agreeing to a virtual visit, we'd like you to understand that the technology does not allow for your provider to perform an examination, and thus may limit your provider's ability to fully assess your condition.  Finally, though the technology is pretty good, we cannot assure that it will always work on either your or our end, and in the setting of a video visit, we may have to convert it to a phone-only visit.  In either situation, we cannot ensure that we have a secure connection.  Are you willing to proceed?"  2. Give patient instructions for WebEx download to smartphone as below if video visit  3. Advise patient to be prepared with any vital sign or heart rhythm information, their current medicines, and a piece of paper and pen handy for any instructions they may receive the day of their visit  4. Inform patient they will receive a phone call 15 minutes prior to their appointment time (may be from unknown caller ID) so they should be prepared to answer  5. Confirm that appointment type is correct in Epic appointment notes (video vs telephone)    TELEPHONE CALL NOTE  Marie Peterson has been deemed a candidate for a follow-up tele-health visit to limit community exposure during the Covid-19 pandemic. I spoke with the patient via phone to ensure availability of phone/video source, confirm preferred email & phone number, and discuss  instructions and expectations.  I reminded Marie Peterson to be prepared with any vital sign and/or heart rhythm information that could potentially be obtained via home monitoring, at the time of her visit. I reminded Marie Peterson to expect a phone call at the time of her visit if her visit.  Did the patient verbally acknowledge consent to treatment? YES  [name], [place] 07/04/2018 4:03 PM   DOWNLOADING THE [place]  - If Apple, go to [employer] and type in WebEx in the search bar. [place] [employer], the blue/green circle. The app is free but as with any other app downloads, their phone may require them to verify saved payment information or Apple password. The patient does NOT have to create an account.  - If Android, ask patient to go to [employer] and type in WebEx in the search bar. [place] [employer], the blue/green circle. The app is free but as with any other app downloads, their phone may require them to verify saved payment information or Android password. The patient does NOT have to create an account.   CONSENT FOR TELE-HEALTH VISIT - PLEASE REVIEW  I hereby voluntarily request, consent and authorize [facility] and its employed or contracted physicians, physician assistants, nurse practitioners or other licensed health care professionals (the Practitioner), to provide me with telemedicine health care services (the "Services") as deemed necessary by the treating Practitioner. I  acknowledge and consent to receive the Services by the Practitioner via telemedicine. I understand that the telemedicine visit will involve communicating with the Practitioner through live audiovisual communication technology and the disclosure of certain medical information by electronic transmission. I acknowledge that I have been given the opportunity to request an in-person assessment or other available alternative prior to the telemedicine visit and am  voluntarily participating in the telemedicine visit.  I understand that I have the right to withhold or withdraw my consent to the use of telemedicine in the course of my care at any time, without affecting my right to future care or treatment, and that the Practitioner or I may terminate the telemedicine visit at any time. I understand that I have the right to inspect all information obtained and/or recorded in the course of the telemedicine visit and may receive copies of available information for a reasonable fee.  I understand that some of the potential risks of receiving the Services via telemedicine include:  [name] Kitchen Delay or interruption in medical evaluation due to technological equipment failure or disruption; . Information transmitted may not be sufficient (e.g. poor resolution of images) to allow for appropriate medical decision making by the Practitioner; and/or  . In rare instances, security protocols could fail, causing a breach of personal health information.  Furthermore, I acknowledge that it is my responsibility to provide information about my medical history, conditions and care that is complete and accurate to the best of my ability. I acknowledge that Practitioner's advice, recommendations, and/or decision may be based on factors not within their control, such as incomplete or inaccurate data provided by me or distortions of diagnostic images or specimens that may result from electronic transmissions. I understand that the practice of medicine is not an exact science and that Practitioner makes no warranties or guarantees regarding treatment outcomes. I acknowledge that I will receive a copy of this consent concurrently upon execution via email to the email address I last provided but may also request a printed copy by calling the office of [place].    I understand that my insurance will be billed for this visit.   I have read or had this consent read to me. . I understand the  contents of this consent, which adequately explains the benefits and risks of the Services being provided via telemedicine.  . I have been provided ample opportunity to ask questions regarding this consent and the Services and have had my questions answered to my satisfaction. . I give my informed consent for the services to be provided through the use of telemedicine in my medical care  By participating in this telemedicine visit I agree to the above.

## 2018-07-05 ENCOUNTER — Telehealth: Payer: Self-pay

## 2018-07-05 ENCOUNTER — Telehealth (INDEPENDENT_AMBULATORY_CARE_PROVIDER_SITE_OTHER): Payer: 59 | Admitting: Cardiovascular Disease

## 2018-07-05 DIAGNOSIS — R0789 Other chest pain: Secondary | ICD-10-CM | POA: Diagnosis not present

## 2018-07-05 DIAGNOSIS — R001 Bradycardia, unspecified: Secondary | ICD-10-CM | POA: Diagnosis not present

## 2018-07-05 DIAGNOSIS — I10 Essential (primary) hypertension: Secondary | ICD-10-CM | POA: Diagnosis not present

## 2018-07-05 MED ORDER — POTASSIUM CHLORIDE CRYS ER 20 MEQ PO TBCR: 20.0000 meq | EXTENDED_RELEASE_TABLET | Freq: Every day | ORAL | 3 refills | Status: DC

## 2018-07-05 MED ORDER — MAGNESIUM OXIDE 400 (241.3 MG) MG PO TABS: 1.0000 | ORAL_TABLET | Freq: Every day | ORAL | 3 refills | Status: DC

## 2018-07-05 NOTE — Telephone Encounter (Signed)
Patient and/or DPR-approved person aware of AVS instructions and verbalized understanding. Aware AVS released to [facility]

## 2018-07-05 NOTE — Progress Notes (Signed)
Virtual Visit via Video Note   This visit type was conducted due to national recommendations for restrictions regarding the COVID-19 Pandemic (e.g. social distancing) in an effort to limit this patient's exposure and mitigate transmission in our community.  Due to her co-morbid illnesses, this patient is at least at moderate risk for complications without adequate follow up.  This format is felt to be most appropriate for this patient at this time.  All issues noted in this document were discussed and addressed.  A limited physical exam was performed with this format.  Please refer to the patient's chart for her consent to telehealth for [facility].   Evaluation Performed:  Follow-up visit  Date:  07/05/2018   ID:  Marie Peterson, DOB 08/19/1965, MRN [id number]  Patient Location: Home  Provider Location: Home  PCP:  [name], MD  Cardiologist: Dr. Quay Burow Electrophysiologist:  None   Chief Complaint: Fatigue and bradycardia  History of Present Illness:    Marie Peterson is a 53 y.o. female who presents via audio/video conferencing for a telehealth visit today.    Marie Peterson is a 53 y.o. female mildly overweight married an American female mother of 2 daughters one of them graduated from [employer] and works for [employer], and the other graduated from [employer] and got her MBA at state, and currently works at [employer] in [place] who is formally a patient of Dr. Lowella Fairy. She currently is out on disability because of SLE. I last saw her in the office  01/09/2017.Her factors include treated hypertension. Her father had bypass surgery at age 16. She's never had a heart attack or stroke. She denies chest pain or shortness of breath. She does have GERD. Since I saw her in the office one year ago she continues to have occasional atypical chest pain. A routine GXT performed 11/12/14 was entirely normal.   Since I saw her in the office a year ago she did get a 2D  echo 12/28/2016 which was normal and a monitor 01/04/2017 that showed sinus bradycardia with heart rates in the 40s to 60s.  This was performed because of fatigue.  She does get some dyspnea on exertion and I think the majority of her symptoms are related to her rheumatologic disorders including SLE and fibromyalgia which is a recent diagnosis.  She denies chest pain.  The patient does not have symptoms concerning for COVID-19 infection (fever, chills, cough, or new shortness of breath).    Past Medical History:  Diagnosis Date  . Arthritis   . Atrophic vaginitis   . Atypical chest pain    thought to be GERD; myoview 05/17/07-no significant ischemia; echo 07/20/11- normal EF=>55%  . Baker's cyst 03/28/12   venous doppler 03/28/12=no thrombus; baker's cyst at left popliteal fossa  . Chest pain   . CIN I (cervical intraepithelial neoplasia I) 1990  . Degenerative disc disease, cervical 2020   severe, C3-4, 4-5, 5-6. Currently receiving injections by Dr. Vira Blanco of preferred pain management  . Fibroid   . Fibromyalgia   . Hypertension   . Lupus nephritis ([place])   . Migraines   . Neuropathy   . Osteopenia 09/2017   T score -2.0 improved from prior study  . Premature menopause age 68   following chemotherapy for Lupus  . Shingles    Past Surgical History:  Procedure Laterality Date  . cervical fossa injections  2020  . CESAREAN SECTION    . COLPOSCOPY    .  HYSTEROSCOPY     Myomectomy  . MYOMECTOMY     Hysteroscopic myomectomy  . NASAL SINUS SURGERY  01/2016  . TUBAL LIGATION       Current Meds  Medication Sig  . alendronate (FOSAMAX) 70 MG tablet TAKE 1 TABLET BY MOUTH EVERY 7 DAYS WITH A FULL GLASS OF WATER AND ON AN EMPTY STOMACH  . amLODipine (NORVASC) 5 MG tablet Take 5 mg by mouth daily.   [name] Kitchen aspirin 81 MG tablet Take 81 mg by mouth daily.  . BELSOMRA 10 MG TABS take 1 tablet by mouth at bedtime if needed for insomnia  . benazepril (LOTENSIN) 40 MG tablet Take 1 tablet by mouth  daily.  . Cholecalciferol (VITAMIN D PO) Take 2,000 Int'l Units by mouth daily.   . diclofenac sodium (VOLTAREN) 1 % GEL Apply topically.  [name] Kitchen FERREX 150 150 MG capsule Take 1 tablet by mouth daily.  . folic acid (FOLVITE) 017 MCG tablet Take 400 mcg by mouth daily.  . Fremanezumab-vfrm (AJOVY) 225 MG/1.5ML SOSY Inject 675 mg into the skin every 3 (three) months.  . furosemide (LASIX) 40 MG tablet Take 1 tablet by mouth 2 (two) times daily. TAKE 1 AND 1/2 TABLET IN THE MORNING AND 1 TABLET AT NIGHT.  [name] Kitchen gabapentin (NEURONTIN) 600 MG tablet TAKE 1 TABLET BY MOUTH THREE TIMES DAILY  . HYDROcodone-acetaminophen (NORCO) 10-325 MG per tablet Take 1 tablet by mouth every 6 (six) hours as needed for pain. 1-3 Tabs per day as needed  . hydroxychloroquine (PLAQUENIL) 200 MG tablet Take 2 tablets by mouth daily.  [name] Kitchen L-Methylfolate-B6-B12 (METANX PO) Take 1 tablet by mouth daily.  [name] Kitchen lidocaine (LIDODERM) 5 % Place 3 patches onto the skin daily. Remove & Discard patch within 12 hours or as directed by MD (Patient taking differently: Place 3 patches onto the skin daily. Remove & Discard patch within 12 hours or as directed by MD For use as needed)  . magnesium oxide (MAG-OX) 400 (241.3 Mg) MG tablet Take 1 tablet (400 mg total) by mouth daily. Please schedule appointment for refills  . Multiple Vitamin (MULTIVITAMIN) tablet Take 1 tablet by mouth daily.  . nabumetone (RELAFEN) 500 MG tablet Take 500 mg by mouth 2 (two) times daily as needed.  . Omega-3 Fatty Acids (FISH OIL) 1000 MG CAPS Take 2 capsules by mouth daily.  [name] Kitchen omeprazole (PRILOSEC) 40 MG capsule Take 40 mg by mouth daily.  . ondansetron (ZOFRAN-ODT) 4 MG disintegrating tablet dissolve 1 tablet ON TONGUE every 8 hours if needed for nausea and vomiting  . ONZETRA XSAIL 11 MG/NOSEPC EXHP INSTILL 1 SPRAY INTO EACH NOSTRIL AT THE ONSET OF MIGRAINE. MAY REPEAT ONCE MORE AFTER 2 HOURS  . phentermine 37.5 MG capsule Take 37.5 mg by mouth every morning.  .  potassium chloride SA (K-DUR,KLOR-CON) 20 MEQ tablet Take 1 tablet (20 mEq total) by mouth daily. NEED OV.  . predniSONE (DELTASONE) 5 MG tablet Take 5 mg by mouth daily.  . SUMAtriptan (IMITREX) 100 MG tablet TAKE 1 TABLET BY MOUTH AT ONSET OF HEADACHE MAY REPEAT IN 2 HOURS IF HEADACHE CONTINUES  . tolterodine (DETROL LA) 4 MG 24 hr capsule Take 4 mg by mouth daily.     Allergies:   Myrbetriq [mirabegron]   Social History   Tobacco Use  . Smoking status: Never Smoker  . Smokeless tobacco: Never Used  Substance Use Topics  . Alcohol use: No    Alcohol/week: 0.0 standard drinks  . Drug use:  No     Family Hx: The patient's family history includes Asthma in her mother; COPD in her mother; Diabetes in her father; Heart disease in her father; Hypertension in her father and mother.  ROS:   Please see the history of present illness.     All other systems reviewed and are negative.   Prior CV studies:   The following studies were reviewed today:  2D echocardiogram, event monitor performed October 2018  Labs/Other Tests and Data Reviewed:    EKG:  No ECG reviewed.  Recent Labs: No results found for requested labs within last 8760 hours.   Recent Lipid Panel No results found for: CHOL, TRIG, HDL, CHOLHDL, LDLCALC, LDLDIRECT  Wt Readings from Last 3 Encounters:  07/05/18 160 lb 3.2 oz (72.7 kg)  10/04/17 173 lb (78.5 kg)  01/09/17 182 lb (82.6 kg)     Objective:    Vital Signs:  BP 139/80   Pulse (!) 53   Wt 160 lb 3.2 oz (72.7 kg)   BMI 27.50 kg/m    A physical exam was not performed today since this was a telemedicine video visit.  ASSESSMENT & PLAN:    1. Essential hypertension- history of essential hypertension with blood pressure measured at home by herself this morning of 139/80 with a pulse of 53.  She is on amlodipine and benazepril 2. Atypical chest pain- negative GXT performed 11/12/2014.  She has not had any chest pain in the last year. 3. Sinus  bradycardia- history of sinus bradycardia on monitor performed 01/04/2017 with heart rates in the 40s to 60s despite being on no negative chronotropic medications.  COVID-19 Education: The signs and symptoms of COVID-19 were discussed with the patient and how to seek care for testing (follow up with PCP or arrange E-visit).  The importance of social distancing was discussed today.  Time:   Today, I have spent 15 minutes with the patient with telehealth technology discussing the above problems.     Medication Adjustments/Labs and Tests Ordered: Current medicines are reviewed at length with the patient today.  Concerns regarding medicines are outlined above.  Tests Ordered: No orders of the defined types were placed in this encounter.  Medication Changes: No orders of the defined types were placed in this encounter.   Disposition:  Follow up in 1 year(s)  Signed, Quay Burow, MD  07/05/2018 10:29 AM    [facility]

## 2018-07-05 NOTE — Patient Instructions (Signed)

## 2018-07-05 NOTE — Telephone Encounter (Signed)
Pt had f/u appt with dr. Gwenlyn Found on 4/10

## 2018-07-13 ENCOUNTER — Other Ambulatory Visit: Payer: Self-pay | Admitting: Neurology

## 2018-07-19 ENCOUNTER — Other Ambulatory Visit: Payer: Self-pay | Admitting: Neurology

## 2018-07-27 ENCOUNTER — Other Ambulatory Visit: Payer: Self-pay | Admitting: Neurology

## 2018-07-29 ENCOUNTER — Other Ambulatory Visit: Payer: Self-pay | Admitting: *Deleted

## 2018-08-07 DIAGNOSIS — Z0000 Encounter for general adult medical examination without abnormal findings: Secondary | ICD-10-CM | POA: Diagnosis not present

## 2018-08-07 DIAGNOSIS — E559 Vitamin D deficiency, unspecified: Secondary | ICD-10-CM | POA: Diagnosis not present

## 2018-08-07 DIAGNOSIS — I10 Essential (primary) hypertension: Secondary | ICD-10-CM | POA: Diagnosis not present

## 2018-08-07 DIAGNOSIS — E041 Nontoxic single thyroid nodule: Secondary | ICD-10-CM | POA: Diagnosis not present

## 2018-08-14 ENCOUNTER — Telehealth: Payer: Self-pay | Admitting: Neurology

## 2018-08-14 ENCOUNTER — Encounter: Payer: Self-pay | Admitting: *Deleted

## 2018-08-14 NOTE — Telephone Encounter (Signed)
I updated appt note for 6/16 indicating pt is coming into office and sent her a mychart message with the process of check-in, covid19 screening questions, and wearing a mask.

## 2018-08-14 NOTE — Telephone Encounter (Signed)
Noted thanks °

## 2018-08-14 NOTE — Telephone Encounter (Addendum)
(  unable to add to appointment note)5-20 Pt has called, phone rep spoke with RN Romelle Starcher. She stated it would be best for pt to come in and see Dr Jaynee Eagles, pt is agreeable to that. Pt is aware that the doxy.me vv will not take place.Pt has not requested a call back she is aware she is to come into the office for both upcoming appointments.

## 2018-08-20 DIAGNOSIS — M3210 Systemic lupus erythematosus, organ or system involvement unspecified: Secondary | ICD-10-CM | POA: Diagnosis not present

## 2018-08-20 DIAGNOSIS — Z79899 Other long term (current) drug therapy: Secondary | ICD-10-CM | POA: Diagnosis not present

## 2018-08-20 DIAGNOSIS — H04123 Dry eye syndrome of bilateral lacrimal glands: Secondary | ICD-10-CM | POA: Diagnosis not present

## 2018-08-21 ENCOUNTER — Ambulatory Visit (INDEPENDENT_AMBULATORY_CARE_PROVIDER_SITE_OTHER): Payer: 59 | Admitting: *Deleted

## 2018-08-21 ENCOUNTER — Other Ambulatory Visit: Payer: Self-pay

## 2018-08-21 VITALS — Temp 97.3°F

## 2018-08-21 DIAGNOSIS — G43009 Migraine without aura, not intractable, without status migrainosus: Secondary | ICD-10-CM

## 2018-08-21 NOTE — Progress Notes (Signed)
Patient given her 3 scheduled injections of Ajovy 225 mg each. Total 675 mg. She brought these from home. They were administered in 3 separate areas in herRUQ of abdomen. Pt tolerated welland aseptic technique used. Next injections due on08/27/20. Scheduled next injections for that day at 830am. Pt given appt card.

## 2018-09-05 NOTE — Telephone Encounter (Signed)
Spoke with pt and confirmed she is comfortable coming into office for 6/16 8:30 AM appt. Pt aware to bring her own mask and aware of drive-up check-in process. Pt aware temperature will be taken upon arrival along with being asked COVID19 screening questions. She passed the questions today. Pt verbalized appreciation for the call.

## 2018-09-10 ENCOUNTER — Other Ambulatory Visit: Payer: Self-pay

## 2018-09-10 ENCOUNTER — Ambulatory Visit (INDEPENDENT_AMBULATORY_CARE_PROVIDER_SITE_OTHER): Payer: 59 | Admitting: Neurology

## 2018-09-10 ENCOUNTER — Encounter: Payer: Self-pay | Admitting: Neurology

## 2018-09-10 VITALS — BP 129/80 | HR 60 | Temp 97.1°F | Ht 64.0 in | Wt 161.0 lb

## 2018-09-10 DIAGNOSIS — W19XXXA Unspecified fall, initial encounter: Secondary | ICD-10-CM

## 2018-09-10 DIAGNOSIS — E538 Deficiency of other specified B group vitamins: Secondary | ICD-10-CM

## 2018-09-10 DIAGNOSIS — R2689 Other abnormalities of gait and mobility: Secondary | ICD-10-CM

## 2018-09-10 DIAGNOSIS — G629 Polyneuropathy, unspecified: Secondary | ICD-10-CM

## 2018-09-10 DIAGNOSIS — Z131 Encounter for screening for diabetes mellitus: Secondary | ICD-10-CM

## 2018-09-10 DIAGNOSIS — G5603 Carpal tunnel syndrome, bilateral upper limbs: Secondary | ICD-10-CM | POA: Diagnosis not present

## 2018-09-10 NOTE — Patient Instructions (Signed)
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 The pain can be caused by:  Damage to the sensory nerves that send signals to your spinal cord and brain (peripheral nervous system).  Damage to the sensory nerves in your brain or spinal cord (central nervous system). Neuropathic pain can make you more sensitive to pain. Even a minor sensation can feel very painful. This is usually a long-term condition that can be difficult to treat. The type of pain differs from person to person. It may:  Start suddenly (acute), or it may develop slowly and last for a long time (chronic).  Come and go as damaged nerves heal, or it may stay at the same level for years.  Cause emotional distress, loss of sleep, and a lower quality of life. What are the causes? The most common cause of this condition is diabetes. Many other diseases and conditions can also cause neuropathic pain. Causes of neuropathic pain can be classified as:  Toxic. This is caused by medicines and chemicals. The most common cause of toxic neuropathic pain is damage from cancer treatments (chemotherapy).  Metabolic. This can be caused by: ? Diabetes. This is the most common disease that damages the nerves. ? Lack of vitamin B from long-term alcohol abuse.  Traumatic. Any injury that cuts, crushes, or stretches a nerve can cause damage and pain. A common example is feeling pain after losing an arm or leg (phantom limb pain).  Compression-related. If a sensory nerve gets trapped or compressed for a long period of time, the blood supply to the nerve can be cut off.  Vascular. Many blood vessel diseases can cause neuropathic pain by decreasing blood supply and oxygen to  nerves.  Autoimmune. This type of pain results from diseases in which the body's defense system (immune system) mistakenly attacks sensory nerves. Examples of autoimmune diseases that can cause neuropathic pain include lupus and multiple sclerosis.  Infectious. Many types of viral infections can damage sensory nerves and cause pain. Shingles infection is a common cause of this type of pain.  Inherited. Neuropathic pain can be a symptom of many diseases that are passed down through families (genetic). What increases the risk? You are more likely to develop this condition if:  You have diabetes.  You smoke.  You drink too much alcohol.  You are taking certain medicines, including medicines that kill cancer cells (chemotherapy) or that treat immune system disorders. What are the signs or symptoms? The main symptom is pain. Neuropathic pain is often described as:  Burning.  Shock-like.  Stinging.  Hot or cold.  Itching. How is this diagnosed? No single test can diagnose neuropathic pain. It is diagnosed based on:  Physical exam and your symptoms. Your health care provider will ask you about your pain. You may be asked to use a pain scale to describe how bad your pain is.  Tests. These may be done to see if you have a high sensitivity to pain and to help find the cause and location of any sensory nerve damage. They include: ? Nerve conduction studies to test how well nerve signals travel through your sensory nerves (electrodiagnostic testing). ? Stimulating your sensory nerves through electrodes on your skin and measuring the response in your spinal  cord and brain (somatosensory evoked potential).  Imaging studies, such as: ? X-rays. ? CT scan. ? MRI. How is this treated? Treatment for neuropathic pain may change over time. You may need to try different treatment options or a combination of treatments. Some options include:  Treating the underlying cause of the neuropathy,  such as diabetes, kidney disease, or vitamin deficiencies.  Stopping medicines that can cause neuropathy, such as chemotherapy.  Medicine to relieve pain. Medicines may include: ? Prescription or over-the-counter pain medicine. ? Anti-seizure medicine. ? Antidepressant medicines. ? Pain-relieving patches that are applied to painful areas of skin. ? A medicine to numb the area (local anesthetic), which can be injected as a nerve block.  Transcutaneous nerve stimulation. This uses electrical currents to block painful nerve signals. The treatment is painless.  Alternative treatments, such as: ? Acupuncture. ? Meditation. ? Massage. ? Physical therapy. ? Pain management programs. ? Counseling. Follow these instructions at home: Medicines   Take over-the-counter and prescription medicines only as told by your health care provider.  Do not drive or use heavy machinery while taking prescription pain medicine.  If you are taking prescription pain medicine, take actions to prevent or treat constipation. Your health care provider may recommend that you: ? Drink enough fluid to keep your urine pale yellow. ? Eat foods that are high in fiber, such as fresh fruits and vegetables, whole grains, and beans. ? Limit foods that are high in fat and processed sugars, such as fried or sweet foods. ? Take an over-the-counter or prescription medicine for constipation. Lifestyle   Have a good support system at home.  Consider joining a chronic pain support group.  Do not use any products that contain nicotine or tobacco, such as cigarettes and e-cigarettes. If you need help quitting, ask your health care provider.  Do not drink alcohol. General instructions  Learn as much as you can about your condition.  Work closely with all your health care providers to find the treatment plan that works best for you.  Ask your health care provider what activities are safe for you.  Keep all follow-up  visits as told by your health care provider. This is important. Contact a health care provider if:  Your pain treatments are not working.  You are having side effects from your medicines.  You are struggling with tiredness (fatigue), mood changes, depression, or anxiety. Summary  Neuropathic pain is pain caused by damage to the nerves that are responsible for certain sensations in your body (sensory nerves).  Neuropathic pain may come and go as damaged nerves heal, or it may stay at the same level for years.  Neuropathic pain is usually a long-term condition that can be difficult to treat. Consider joining a chronic pain support group. This information is not intended to replace advice given to you by your health care provider. Make sure you discuss any questions you have with your health care provider. Document Released: 12/09/2003 Document Revised: 03/30/2017 Document Reviewed: 03/30/2017 Elsevier Interactive Patient Education  2019 [place] [employer]  Carpal tunnel syndrome is a condition that causes pain in your hand and arm. The carpal tunnel is a narrow area that is on the palm side of your wrist. Repeated wrist motion or certain diseases may cause swelling in the tunnel. This swelling can pinch the main nerve in the wrist (median nerve). What are the causes? This condition may be caused by:  Repeated wrist motions.  Wrist injuries.  Arthritis.  A sac of fluid (cyst) or abnormal growth (tumor) in the carpal tunnel.  Fluid buildup during pregnancy. Sometimes the cause is not known. What increases the risk? The following factors may make you more likely to develop this condition:  Having a job in which you move your wrist in the same way many times. This includes jobs like being a Software engineer or a Scientist, water quality.  Being a woman.  Having other health conditions, such as: ? Diabetes. ? Obesity. ? A thyroid gland that is not active enough (hypothyroidism). ?  Kidney failure. What are the signs or symptoms? Symptoms of this condition include:  A tingling feeling in your fingers.  Tingling or a loss of feeling (numbness) in your hand.  Pain in your entire arm. This pain may get worse when you bend your wrist and elbow for a long time.  Pain in your wrist that goes up your arm to your shoulder.  Pain that goes down into your palm or fingers.  A weak feeling in your hands. You may find it hard to grab and hold items. You may feel worse at night. How is this diagnosed? This condition is diagnosed with a medical history and physical exam. You may also have tests, such as:  Electromyogram (EMG). This test checks the signals that the nerves send to the muscles.  Nerve conduction study. This test checks how well signals pass through your nerves.  Imaging tests, such as X-rays, ultrasound, and MRI. These tests check for what might be the cause of your condition. How is this treated? This condition may be treated with:  Lifestyle changes. You will be asked to stop or change the activity that caused your problem.  Doing exercise and activities that make bones and muscles stronger (physical therapy).  Learning how to use your hand again (occupational therapy).  Medicines for pain and swelling (inflammation). You may have injections in your wrist.  A wrist splint.  Surgery. Follow these instructions at home: If you have a splint:  Wear the splint as told by your doctor. Remove it only as told by your doctor.  Loosen the splint if your fingers: ? Tingle. ? Lose feeling (become numb). ? Turn cold and blue.  Keep the splint clean.  If the splint is not waterproof: ? Do not let it get wet. ? Cover it with a watertight covering when you take a bath or a shower. Managing pain, stiffness, and swelling   If told, put ice on the painful area: ? If you have a removable splint, remove it as told by your doctor. ? Put ice in a plastic bag.  ? Place a towel between your skin and the bag. ? Leave the ice on for 20 minutes, 2-3 times per day. General instructions  Take over-the-counter and prescription medicines only as told by your doctor.  Rest your wrist from any activity that may cause pain. If needed, talk with your boss at work about changes that can help your wrist heal.  Do any exercises as told by your doctor, physical therapist, or occupational therapist.  Keep all follow-up visits as told by your doctor. This is important. Contact a doctor if:  You have new symptoms.  Medicine does not help your pain.  Your symptoms get worse. Get help right away if:  You have very bad numbness or tingling in your wrist or hand. Summary  Carpal tunnel syndrome is a condition that causes pain in your hand and arm.  It is often  caused by repeated wrist motions.  Lifestyle changes and medicines are used to treat this problem. Surgery may help in very bad cases.  Follow your doctor's instructions about wearing a splint, resting your wrist, keeping follow-up visits, and calling for help. This information is not intended to replace advice given to you by your health care provider. Make sure you discuss any questions you have with your health care provider. Document Released: 03/02/2011 Document Revised: 07/20/2017 Document Reviewed: 07/20/2017 Elsevier Interactive Patient Education  2019 [employer].

## 2018-09-10 NOTE — Progress Notes (Signed)
[facility]    Provider:  Dr Jaynee Eagles Referring Provider: [name], MD Primary Care Physician:  [name], MD  CC: Migraines   Interval history 09/10/2018: She is here for a new issues, neuropathy in the hands and feet, poor balance.  She has a past medical history of migraines, hypertension, small fiber neuropathy diagnosed 6 years ago by Dr. Carlyon Shadow, arthritis, lupus nephritis, history of systemic lupus.  I reviewed her chart, in 2014 Dr. Leta Baptist did extensive work-up including ceruloplasmin, copper, IFE and PE, Lyme, sedimentation rate, CRP, B6, angiotensin-converting enzyme, hep C, hep B, HIV, B1, it did not show any other etiology.  She saw Dr. Carlyon Shadow for burning sensation in the feet bilaterally, she had used compounded neuropathy cream and gabapentin.  EMG MRI all labs were negative.  She complained of diffuse numbness and pain in the arms and legs since March 2012.  She has been on gabapentin since 2012 when she was diagnosed with shingles after her right torso.  She has tingling in the hands and feet and her hands get weak at times and she drops things. The same symptoms, no progression. She also has a lot of stress which makes it worse. It waxes and wanes, can go away for a few months and come back. Worse with stress. She is not working, she is on disability, worse with use of hands or on the computer.  She has CTS in both hands, she has had it for a long time.  Interval history 12/26/2016: The nasal powder works (imitrex Animator) use it 3 days a month for severe migraines, Out of 30 days, she has 15 headache days a month. No OTC medications. No medication overuse. No snoring, not excessive fatigue during the day. No morning headaches.   Interval history 05/08/2016: Patient is here for follow up on migraines. She has a PMHx of lupus with glomerulonephritis, osteoarthritis, shingles. She has been seen here in the office for small-fiber neuropathy as well as  migraines.She could not tolerate 25mg  of nortriptyline at night and was placed on 10mg  at night. She did not tolerate the Topamax. Depakote with side effects as well. She is using triptan and zofran for migraines. She is on Lyrica for post-herpetic neuralgia. She had recent sinus surgery with Dr. Minna Merritts. She still has migraines but they are improved. The headaches can linger on and the imitrex helps but doesn;t take it away, zofran helps with the nausea. The migraines progress over an hour to maxumum severity, discussed nose sprays or pwders or injectable but prefers pills.   GHW:EXHB L McLeanis a 53 y.o.femalehere as a referral from Dr. Wonda Olds continued migraines. Migraines started worsening about 2 weeks ago. Before this she was very well controlled. She had migraines every day. She had side effects to the Topamax, withincrease started getting double vision, she went back to 50mg  bid. She feels she has memory loss with the Topamax. She has insomnia, she takes [place] every night, she still has problems. Migraines are on the left side. Pounding in her head. They last most of the day. Daily. +light sensitivity, +sound sensitivity. +nausea. No vomiting. They can be up to 10/10, last week it was awful 10/10 usually 6/10 on average of pain. Imitrex usually works. Advised to take with a zofran. Discussed triggers, discussed other medication possibilities.    Last visit:She has a PMHx of lupus with glomerulonephritis, osteoarthritis, shingles. She has been seen here in the office for small-fiber neuropathy as well as migraines.  She is on the Topamax. She gets migraines twice a month. Migraines are more on the right. She has nausea, light sensitivity. No aura. They can last all day. She is in bed most of the day. She takes the imitrex at the onset and it helps. No side effects from the topamax and imitrex. She uses the Lidoderm patches for her post herpetic neuralgia but it was $75. It gets worse in  the winter time, now it is ok. Pain is better in the summer. Lyrica helped a lot. She is having pains in her feet at the bottom and it wakes her up. She has sharp pains in the heels. The pain is when she walks on her heel. And is tender at night in the heel with sharp shooting pains in the heel. Pain right >left heel.   Medications tried for migraines: Imitrex, Topamax (50 qam and 100mg  qpm, had side effects of memory loss and blurry vision),imitrex   Review of Systems: Patient complains of symptoms per HPI as well as the following symptoms: light sensitivity, blurred vision, joint pain, joint swelling, aching muscles, muscle cramps, rash, dizziness, headache. Pertinent negatives per HPI. All others negative.   Social History   Socioeconomic History  . Marital status: Married    Spouse name: [name]  . Number of children: 2  . Years of education: College  . Highest education level: Not on file  Occupational History    Employer: DISABLED    Comment: disability  Social Needs  . Financial resource strain: Not on file  . Food insecurity    Worry: Not on file    Inability: Not on file  . Transportation needs    Medical: Not on file    Non-medical: Not on file  Tobacco Use  . Smoking status: Never Smoker  . Smokeless tobacco: Never Used  Substance and Sexual Activity  . Alcohol use: No    Alcohol/week: 0.0 standard drinks  . Drug use: No  . Sexual activity: Not Currently    Birth control/protection: Surgical, Post-menopausal    Comment: Tubal lig-1st intercourse 53 yo-Fewer than 5 partners  Lifestyle  . Physical activity    Days per week: Not on file    Minutes per session: Not on file  . Stress: Not on file  Relationships  . Social Herbalist on phone: Not on file    Gets together: Not on file    Attends religious service: Not on file    Active member of club or organization: Not on file    Attends meetings of clubs or organizations: Not on file     Relationship status: Not on file  . Intimate partner violence    Fear of current or ex partner: Not on file    Emotionally abused: Not on file    Physically abused: Not on file    Forced sexual activity: Not on file  Other Topics Concern  . Not on file  Social History Narrative   Patient lives at home with her daughter. She is currently separated (not legally).   Caffeine Use: 1 cup of coffee daily.    Right-handed    Family History  Problem Relation Age of Onset  . Hypertension Mother   . Asthma Mother   . COPD Mother   . Diabetes Father   . Hypertension Father   . Heart disease Father     Past Medical History:  Diagnosis Date  . Arthritis   . Atrophic vaginitis   .  Atypical chest pain    thought to be GERD; myoview 05/17/07-no significant ischemia; echo 07/20/11- normal EF=>55%  . Baker's cyst 03/28/12   venous doppler 03/28/12=no thrombus; baker's cyst at left popliteal fossa  . Chest pain   . CIN I (cervical intraepithelial neoplasia I) 1990  . Degenerative disc disease, cervical 2020   severe, C3-4, 4-5, 5-6. Currently receiving injections by Dr. Vira Blanco of preferred pain management  . Fibroid   . Fibromyalgia   . Hypertension   . Lupus nephritis ([place])   . Migraines   . Neuropathy   . Osteopenia 09/2017   T score -2.0 improved from prior study  . Premature menopause age 21   following chemotherapy for Lupus  . Shingles     Past Surgical History:  Procedure Laterality Date  . cervical fossa injections  2020  . CESAREAN SECTION    . COLPOSCOPY    . HYSTEROSCOPY     Myomectomy  . MYOMECTOMY     Hysteroscopic myomectomy  . NASAL SINUS SURGERY  01/2016  . TUBAL LIGATION      Current Outpatient Medications  Medication Sig Dispense Refill  . AJOVY 225 MG/1.5ML SOSY INJECT 675 MILLIGRAM SUBCUTANEOUSLY EVERY 3 MONTHS 4.5 mL 11  . alendronate (FOSAMAX) 70 MG tablet TAKE 1 TABLET BY MOUTH EVERY 7 DAYS WITH A FULL GLASS OF WATER AND ON AN EMPTY STOMACH 4 tablet 12   . amLODipine (NORVASC) 5 MG tablet Take 5 mg by mouth daily.     [name] Kitchen aspirin 81 MG tablet Take 81 mg by mouth daily.    . BELSOMRA 10 MG TABS take 1 tablet by mouth at bedtime if needed for insomnia  0  . benazepril (LOTENSIN) 40 MG tablet Take 1 tablet by mouth daily.  1  . Cholecalciferol (VITAMIN D PO) Take 2,000 Int'l Units by mouth daily.     . diclofenac sodium (VOLTAREN) 1 % GEL Apply topically.    [name] Kitchen FERREX 150 150 MG capsule Take 1 tablet by mouth daily.  1  . folic acid (FOLVITE) 626 MCG tablet Take 400 mcg by mouth daily.    . furosemide (LASIX) 40 MG tablet Take 1 tablet by mouth 2 (two) times daily. TAKE 1 AND 1/2 TABLET IN THE MORNING AND 1 TABLET AT NIGHT.  0  . gabapentin (NEURONTIN) 600 MG tablet TAKE 1 TABLET BY MOUTH THREE TIMES DAILY 270 tablet 1  . hydroxychloroquine (PLAQUENIL) 200 MG tablet Take 2 tablets by mouth daily.  0  . L-Methylfolate-B6-B12 (METANX PO) Take 1 tablet by mouth 2 (two) times daily.     [name] Kitchen lidocaine (LIDODERM) 5 % Place 3 patches onto the skin daily. Remove & Discard patch within 12 hours or as directed by MD (Patient taking differently: Place 2 patches onto the skin daily. Remove & Discard patch within 12 hours or as directed by MD For use as needed) 90 patch 6  . magnesium oxide (MAG-OX) 400 (241.3 Mg) MG tablet Take 1 tablet (400 mg total) by mouth daily. Please schedule appointment for refills 90 tablet 3  . Multiple Vitamin (MULTIVITAMIN) tablet Take 1 tablet by mouth daily.    . nabumetone (RELAFEN) 500 MG tablet Take 500 mg by mouth 2 (two) times daily as needed.  0  . Omega-3 Fatty Acids (FISH OIL) 1000 MG CAPS Take 2 capsules by mouth daily.    [name] Kitchen omeprazole (PRILOSEC) 40 MG capsule Take 40 mg by mouth daily.  1  . ondansetron (ZOFRAN-ODT) 4 MG  disintegrating tablet DISSOLVE 1 TABLET ON TONGUE EVERY 8 HOURS IF NEEDED FOR NAUSEA AND VOMITING 20 tablet 11  . ONZETRA XSAIL 11 MG/NOSEPC EXHP INSTILL 1 SPRAY IN EACH NOSTRIL AT THE ONSET OF MIGRAINE, MAY  REPEAT ONCE MORE AFTER 2 HOURS AS NEEDED 16 each 0  . oxyCODONE-acetaminophen (PERCOCET) 7.5-325 MG tablet Take 1 tablet by mouth every 8 (eight) hours.    . phentermine 37.5 MG capsule Take 37.5 mg by mouth every morning.    . potassium chloride SA (K-DUR,KLOR-CON) 20 MEQ tablet Take 1 tablet (20 mEq total) by mouth daily. NEED OV. 90 tablet 3  . predniSONE (DELTASONE) 5 MG tablet Take 5 mg by mouth daily.    . SUMAtriptan (IMITREX) 100 MG tablet TAKE 1 TABLET BY MOUTH AT ONSET OF HEADACHE MAY REPEAT IN 2 HOURS IF HEADACHE CONTINUES 10 tablet 11  . tolterodine (DETROL LA) 4 MG 24 hr capsule Take 4 mg by mouth daily.     No current facility-administered medications for this visit.     Allergies as of 09/10/2018 - Review Complete 09/10/2018  Allergen Reaction Noted  . Cymbalta [duloxetine hcl]  09/10/2018  . Myrbetriq [mirabegron] Itching 10/21/2014    Vitals: BP 129/80 (BP Location: Right Arm, Patient Position: Sitting)   Pulse 60   Temp (!) 97.1 F (36.2 C)   Ht 5\' 4"  (1.626 m)   Wt 161 lb (73 kg)   BMI 27.64 kg/m  Last Weight:  Wt Readings from Last 1 Encounters:  09/10/18 161 lb (73 kg)   Last Height:   Ht Readings from Last 1 Encounters:  09/10/18 5\' 4"  (1.626 m)    Physical exam: Exam: Gen: NAD, conversant, well nourised, obese, well groomed                     CV: RRR, no MRG. No Carotid Bruits. Mild ankle peripheral edema, warm, nontender Eyes: Conjunctivae clear without exudates or hemorrhage  Neuro: Detailed Neurologic Exam  Speech:    Speech is normal; fluent and spontaneous with normal comprehension.  Cognition:    The patient is oriented to person, place, and time;     recent and remote memory intact;     language fluent;     normal attention, concentration,     fund of knowledge Cranial Nerves:    The pupils are equal, round, and reactive to light. The fundi are normal and spontaneous venous pulsations are present. Visual fields are full to finger  confrontation. Extraocular movements are intact. Trigeminal sensation is intact and the muscles of mastication are normal. The face is symmetric. The palate elevates in the midline. Hearing intact. Voice is normal. Shoulder shrug is normal. The tongue has normal motion without fasciculations.   Coordination:    Normal finger to nose and heel to shin. Normal rapid alternating movements.   Gait:    Heel-toe and tandem gait are normal.   Motor Observation:    No asymmetry, no atrophy, and no involuntary movements noted. Tone:    Normal muscle tone.    Posture:    Posture is normal. normal erect    Strength:    Strength is V/V in the upper and lower limbs.      Sensation: intact to LT, pin prick distally     Reflex Exam:  DTR's:    Trace Aj otherwise deep tendon reflexes in the upper and lower extremities are normal bilaterally possibly slightly brisk for age but not abnormal.  Toes:  The toes are downgoing bilaterally.   Clonus:    Clonus is absent.  + phalen's maneuver   Assessment/Plan:53 year old with migraines. Side effects to Topamax, depakote, nortriptyline. Fortunately her migraine frequency is reduced after sinus surgery with Dr. Ernesto Rutherford and on CGRP.  Here for long standing CTS in the upper extremities and numbness and tingling in the lower extremities diagnsed with small-fiber neuropathy and thoroughly evaluated  CTS: Not interested in surgery, conservative measure, wrist braces Small fiber Neuropathy in feet: Diagnosed with small-fiber neuropathy. Exam is normal, doing well on gabapentin continue. Check b12 and hgbaic Anxiety, stress in life: recommend therapy, she is in a separation and reports stress and memory loss Imbalance and fall: PT for balanc  Orders Placed This Encounter  Procedures  . Hemoglobin A1c  . B12 and Folate Panel  . Methylmalonic acid, serum  . Ambulatory referral to Physical Therapy    Sarina Ill, MD  [facility] [address] [place], [place] [address]  Phone [phone] Fax [phone]  A total of 40 minutes was spent face-to-face with this patient. Over half this time was spent on counseling patient on the  1. Small fiber neuropathy   2. Bilateral carpal tunnel syndrome   3. Screening for diabetes mellitus   4. B12 deficiency   5. Imbalance   6. Fall, initial encounter    diagnosis and different diagnostic and therapeutic options, counseling and coordination of care, risks ans benefits of management, compliance, or risk factor reduction and education.

## 2018-09-15 LAB — HEMOGLOBIN A1C
Est. average glucose Bld gHb Est-mCnc: 108 mg/dL
Hgb A1c MFr Bld: 5.4 % (ref 4.8–5.6)

## 2018-09-15 LAB — METHYLMALONIC ACID, SERUM: Methylmalonic Acid: 167 nmol/L (ref 0–378)

## 2018-09-15 LAB — B12 AND FOLATE PANEL
Folate: >20 ng/mL (ref >3.0)
Vitamin B-12: >2000 pg/mL — ABNORMAL HIGH (ref 232–1245)

## 2018-09-25 DIAGNOSIS — M810 Age-related osteoporosis without current pathological fracture: Secondary | ICD-10-CM | POA: Diagnosis not present

## 2018-09-25 DIAGNOSIS — M329 Systemic lupus erythematosus, unspecified: Secondary | ICD-10-CM | POA: Diagnosis not present

## 2018-09-25 DIAGNOSIS — M25561 Pain in right knee: Secondary | ICD-10-CM | POA: Diagnosis not present

## 2018-09-25 DIAGNOSIS — H109 Unspecified conjunctivitis: Secondary | ICD-10-CM | POA: Diagnosis not present

## 2018-09-25 DIAGNOSIS — M1990 Unspecified osteoarthritis, unspecified site: Secondary | ICD-10-CM | POA: Diagnosis not present

## 2018-09-25 DIAGNOSIS — Z79899 Other long term (current) drug therapy: Secondary | ICD-10-CM | POA: Diagnosis not present

## 2018-09-25 DIAGNOSIS — M797 Fibromyalgia: Secondary | ICD-10-CM | POA: Diagnosis not present

## 2018-10-07 ENCOUNTER — Other Ambulatory Visit: Payer: Self-pay

## 2018-10-08 ENCOUNTER — Encounter: Payer: Self-pay | Admitting: Gynecology

## 2018-10-08 ENCOUNTER — Ambulatory Visit (INDEPENDENT_AMBULATORY_CARE_PROVIDER_SITE_OTHER): Payer: 59 | Admitting: Gynecology

## 2018-10-08 VITALS — BP 122/76 | Ht 64.0 in | Wt 160.0 lb

## 2018-10-08 DIAGNOSIS — N952 Postmenopausal atrophic vaginitis: Secondary | ICD-10-CM

## 2018-10-08 DIAGNOSIS — M8580 Other specified disorders of bone density and structure, unspecified site: Secondary | ICD-10-CM

## 2018-10-08 DIAGNOSIS — Z01419 Encounter for gynecological examination (general) (routine) without abnormal findings: Secondary | ICD-10-CM | POA: Diagnosis not present

## 2018-10-08 MED ORDER — ALENDRONATE SODIUM 70 MG PO TABS: ORAL_TABLET | ORAL | 12 refills | Status: DC

## 2018-10-08 NOTE — Patient Instructions (Signed)
Schedule your mammogram.  Follow-up in 1 year for annual exam. 

## 2018-10-08 NOTE — Progress Notes (Signed)
    Marie Peterson 1966-03-01 [id number]        53 y.o.  [id number] for annual gynecologic exam.  Without gynecologic complaints  Past medical history,surgical history, problem list, medications, allergies, family history and social history were all reviewed and documented as reviewed in the EPIC chart.  ROS:  Performed with pertinent positives and negatives included in the history, assessment and plan.   Additional significant findings : None   Exam: [name] assistant Vitals:   10/08/18 0957  BP: 122/76  Weight: 160 lb (72.6 kg)  Height: 5\' 4"  (1.626 m)   Body mass index is 27.46 kg/m.  General appearance:  Normal affect, orientation and appearance. Skin: Grossly normal HEENT: Without gross lesions.  No cervical or supraclavicular adenopathy. Thyroid normal.  Lungs:  Clear without wheezing, rales or rhonchi Cardiac: RR, without RMG Abdominal:  Soft, nontender, without masses, guarding, rebound, organomegaly or hernia Breasts:  Examined lying and sitting without masses, retractions, discharge or axillary adenopathy. Pelvic:  Ext, BUS, Vagina: With atrophic changes  Cervix: Flush with upper vagina  Uterus: Difficult to palpate but no gross masses or tenderness  Adnexa: Without masses or tenderness    Anus and perineum: Normal   Rectovaginal: Normal sphincter tone without palpated masses or tenderness.    Assessment/Plan:  53 y.o. G17P2002 female for annual gynecologic exam.   1. Postmenopausal.  Having some hot flashes on and off.  No bleeding.  Will monitor her symptoms for now.  Consider HRT if worsens.  Will check to make sure she had TSH checked at her primary physician's office. 2. Osteopenia.  DEXA 2017 T score -2.3.  Started on alendronate 2017 due to worsening osteopenia and daily prednisone.  Follow-up DEXA 2019 T score -2.0.  Will continue on alendronate for now.  Refill x1 year provided.  Plan on repeat DEXA next year at 2-year interval.  Reports having vitamin D  level done at her primary physician's office. 3. Pap smear 2019.  Pap smear done today.  Has had negative Pap smears for the last 5 years in a row to include a negative HPV.  We will go ahead and space her Pap smears on a little bit by doing them every other year and plan on repeating her Pap smear next year. 4. Mammography 08/2017.  Reminded patient she is overdue and she will go ahead and schedule this.  Breast exam normal today. 5. Colonoscopy 2017.  Repeat at their recommended interval. 6. Health maintenance.  No routine lab work done as patient does this elsewhere.  Follow-up 1 year, sooner as needed.   Anastasio Auerbach MD, 10:25 AM 10/08/2018

## 2018-10-09 ENCOUNTER — Other Ambulatory Visit: Payer: Self-pay | Admitting: Gynecology

## 2018-10-18 ENCOUNTER — Other Ambulatory Visit: Payer: Self-pay | Admitting: Gynecology

## 2018-10-18 DIAGNOSIS — Z1231 Encounter for screening mammogram for malignant neoplasm of breast: Secondary | ICD-10-CM

## 2018-10-31 ENCOUNTER — Other Ambulatory Visit: Payer: Self-pay | Admitting: Neurology

## 2018-10-31 ENCOUNTER — Ambulatory Visit (INDEPENDENT_AMBULATORY_CARE_PROVIDER_SITE_OTHER): Payer: 59 | Admitting: Family Medicine

## 2018-10-31 ENCOUNTER — Encounter: Payer: Self-pay | Admitting: Family Medicine

## 2018-10-31 ENCOUNTER — Other Ambulatory Visit: Payer: Self-pay

## 2018-10-31 VITALS — BP 118/71 | HR 54 | Temp 96.4°F | Ht 64.0 in | Wt 161.6 lb

## 2018-10-31 DIAGNOSIS — G43019 Migraine without aura, intractable, without status migrainosus: Secondary | ICD-10-CM | POA: Diagnosis not present

## 2018-10-31 MED ORDER — VENLAFAXINE HCL ER 37.5 MG PO CP24: 37.5000 mg | ORAL_CAPSULE | Freq: Every day | ORAL | 5 refills | Status: DC

## 2018-10-31 NOTE — Progress Notes (Signed)
PATIENT: Marie Peterson DOB: July 05, 1965  REASON FOR VISIT: follow up HISTORY FROM: patient  Chief Complaint  Patient presents with   Follow-up    room 2, alone. Head aches & Neuropathy. "Still has migraines often"   Headache     HISTORY OF PRESENT ILLNESS: Today 10/31/18 Marie Peterson is a 53 y.o. female here today for follow up. She has had an increase in migraines. She is having daily headaches. She reports at least 2-3 migrainous headaches each week with light/sound sensitivity and nausea. Heat and stress make headaches worse. She is going through a separation currently and has noticed migraines are worse over the past month.  She is using Ajovy injections (3injections every three months). She is taking gabapentin 600mg  TID for neuropathy. She is taking Percocet 7.5/325mg  for severe DDD prescribed by PCP. She is also seeing rheumatology for Lupus.  She uses Zomig for abortive therapy that does ease up the headache. She occasionally takes Zofran for nausea with migraine.   She is taking Belsomra for sleep. She does not feel it is helping her. She continues to wake frequently throughout the night. She does not snore. She is admittedly more depressed. She has not spoken with PCP due to him being out of the office for medical leave. She is not taking any antidepressants at this time. She does not remember being on anything in the past with the exception of Cymbalta for pain. This medications did cause mild lip swelling. No respiratory involvement , no swollen tongue or airway. No trouble breathing.   HISTORY: (copied from Dr Cathren Laine note on 09/10/2018)  Interval history 09/10/2018: She is here for a new issues, neuropathy in the hands and feet, poor balance.  She has a past medical history of migraines, hypertension, small fiber neuropathy diagnosed 6 years ago by Dr. Carlyon Shadow, arthritis, lupus nephritis, history of systemic lupus.  I reviewed her chart, in 2014 Dr. Leta Baptist did extensive  work-up including ceruloplasmin, copper, IFE and PE, Lyme, sedimentation rate, CRP, B6, angiotensin-converting enzyme, hep C, hep B, HIV, B1, it did not show any other etiology.  She saw Dr. Carlyon Shadow for burning sensation in the feet bilaterally, she had used compounded neuropathy cream and gabapentin.  EMG MRI all labs were negative.  She complained of diffuse numbness and pain in the arms and legs since March 2012.  She has been on gabapentin since 2012 when she was diagnosed with shingles after her right torso.  She has tingling in the hands and feet and her hands get weak at times and she drops things. The same symptoms, no progression. She also has a lot of stress which makes it worse. It waxes and wanes, can go away for a few months and come back. Worse with stress. She is not working, she is on disability, worse with use of hands or on the computer.  She has CTS in both hands, she has had it for a long time.  Interval history 12/26/2016: The nasal powder works (imitrex Animator) use it 3 days a month for severe migraines, Out of 30 days, she has 15 headache days a month. No OTC medications. No medication overuse. No snoring, not excessive fatigue during the day. No morning headaches.   Interval history 05/08/2016: Patient is here for follow up on migraines. She has a PMHx of lupus with glomerulonephritis, osteoarthritis, shingles. She has been seen here in the office for small-fiber neuropathy as well as migraines.She could not tolerate 25mg  of  nortriptyline at night and was placed on 10mg  at night. She did not tolerate the Topamax. Depakote with side effects as well. She is using triptan and zofran for migraines. She is on Lyrica for post-herpetic neuralgia. She had recent sinus surgery with Dr. Minna Merritts. She still has migraines but they are improved. The headaches can linger on and the imitrex helps but doesn;t take it away, zofran helps with the nausea. The migraines progress over an hour  to maxumum severity, discussed nose sprays or pwders or injectable but prefers pills.   ZOX:WRUE L Peterson a 53 y.o.femalehere as a referral from Dr. Wonda Olds continued migraines. Migraines started worsening about 2 weeks ago. Before this she was very well controlled. She had migraines every day. She had side effects to the Topamax, withincrease started getting double vision, she went back to 50mg  bid. She feels she has memory loss with the Topamax. She has insomnia, she takes [place] every night, she still has problems. Migraines are on the left side. Pounding in her head. They last most of the day. Daily. +light sensitivity, +sound sensitivity. +nausea. No vomiting. They can be up to 10/10, last week it was awful 10/10 usually 6/10 on average of pain. Imitrex usually works. Advised to take with a zofran. Discussed triggers, discussed other medication possibilities.    Last visit:She has a PMHx of lupus with glomerulonephritis, osteoarthritis, shingles. She has been seen here in the office for small-fiber neuropathy as well as migraines. She is on the Topamax. She gets migraines twice a month. Migraines are more on the right. She has nausea, light sensitivity. No aura. They can last all day. She is in bed most of the day. She takes the imitrex at the onset and it helps. No side effects from the topamax and imitrex. She uses the Lidoderm patches for her post herpetic neuralgia but it was $75. It gets worse in the winter time, now it is ok. Pain is better in the summer. Lyrica helped a lot. She is having pains in her feet at the bottom and it wakes her up. She has sharp pains in the heels. The pain is when she walks on her heel. And is tender at night in the heel with sharp shooting pains in the heel. Pain right >left heel.   Medications tried for migraines: Imitrex, Topamax (50 qam and 100mg  qpm, had side effects of memory loss and blurry vision),imitrex   REVIEW OF SYSTEMS: Out of a complete 14  system review of symptoms, the patient complains only of the following symptoms, headaches, dizziness, numbness, weakness and all other reviewed systems are negative.   ALLERGIES: Allergies  Allergen Reactions   Cymbalta [Duloxetine Hcl]     Lip swelling   Myrbetriq [Mirabegron] Itching    HOME MEDICATIONS: Outpatient Medications Prior to Visit  Medication Sig Dispense Refill   AJOVY 225 MG/1.5ML SOSY INJECT 675 MILLIGRAM SUBCUTANEOUSLY EVERY 3 MONTHS 4.5 mL 11   alendronate (FOSAMAX) 70 MG tablet TAKE 1 TABLET BY MOUTH WITH A FULL GLASS OF WATER ON AN EMPTY STOMACH EVERY WEEK 4 tablet 12   amLODipine (NORVASC) 5 MG tablet Take 5 mg by mouth daily.      aspirin 81 MG tablet Take 81 mg by mouth daily.     BELSOMRA 10 MG TABS take 1 tablet by mouth at bedtime if needed for insomnia  0   benazepril (LOTENSIN) 40 MG tablet Take 1 tablet by mouth daily.  1   Cholecalciferol (VITAMIN D PO)  Take 2,000 Int'l Units by mouth daily.      diclofenac sodium (VOLTAREN) 1 % GEL Apply topically.     FERREX 150 150 MG capsule Take 1 tablet by mouth daily.  1   folic acid (FOLVITE) 810 MCG tablet Take 400 mcg by mouth daily.     furosemide (LASIX) 40 MG tablet Take 1 tablet by mouth 2 (two) times daily. TAKE 1 AND 1/2 TABLET IN THE MORNING AND 1 TABLET AT NIGHT.  0   gabapentin (NEURONTIN) 600 MG tablet TAKE 1 TABLET BY MOUTH THREE TIMES DAILY 270 tablet 1   hydroxychloroquine (PLAQUENIL) 200 MG tablet Take 2 tablets by mouth daily.  0   L-Methylfolate-B6-B12 (METANX PO) Take 1 tablet by mouth 2 (two) times daily.      lidocaine (LIDODERM) 5 % Place 3 patches onto the skin daily. Remove & Discard patch within 12 hours or as directed by MD (Patient taking differently: Place 2 patches onto the skin daily. Remove & Discard patch within 12 hours or as directed by MD For use as needed) 90 patch 6   magnesium oxide (MAG-OX) 400 (241.3 Mg) MG tablet Take 1 tablet (400 mg total) by mouth daily.  Please schedule appointment for refills 90 tablet 3   Multiple Vitamin (MULTIVITAMIN) tablet Take 1 tablet by mouth daily.     nabumetone (RELAFEN) 500 MG tablet Take 500 mg by mouth 2 (two) times daily as needed.  0   Omega-3 Fatty Acids (FISH OIL) 1000 MG CAPS Take 2 capsules by mouth daily.     omeprazole (PRILOSEC) 40 MG capsule Take 40 mg by mouth daily.  1   ondansetron (ZOFRAN-ODT) 4 MG disintegrating tablet DISSOLVE 1 TABLET ON TONGUE EVERY 8 HOURS IF NEEDED FOR NAUSEA AND VOMITING 20 tablet 11   ONZETRA XSAIL 11 MG/NOSEPC EXHP INSTILL 1 SPRAY IN EACH NOSTRIL AT THE ONSET OF MIGRAINE, MAY REPEAT ONCE MORE AFTER 2 HOURS AS NEEDED 16 each 0   oxyCODONE-acetaminophen (PERCOCET) 7.5-325 MG tablet Take 1 tablet by mouth every 8 (eight) hours.     phentermine (ADIPEX-P) 37.5 MG tablet TK 1 T PO QD     phentermine 37.5 MG capsule Take 37.5 mg by mouth every morning.     potassium chloride SA (K-DUR,KLOR-CON) 20 MEQ tablet Take 1 tablet (20 mEq total) by mouth daily. NEED OV. 90 tablet 3   predniSONE (DELTASONE) 5 MG tablet Take 5 mg by mouth daily.     SUMAtriptan (IMITREX) 100 MG tablet TAKE 1 TABLET BY MOUTH AT ONSET OF HEADACHE MAY REPEAT IN 2 HOURS IF HEADACHE CONTINUES 10 tablet 11   tolterodine (DETROL LA) 4 MG 24 hr capsule Take 4 mg by mouth daily.     No facility-administered medications prior to visit.     PAST MEDICAL HISTORY: Past Medical History:  Diagnosis Date   Arthritis    Atrophic vaginitis    Atypical chest pain    thought to be GERD; myoview 05/17/07-no significant ischemia; echo 07/20/11- normal EF=>55%   Baker's cyst 03/28/12   venous doppler 03/28/12=no thrombus; baker's cyst at left popliteal fossa   Chest pain    CIN I (cervical intraepithelial neoplasia I) 1990   Degenerative disc disease, cervical 2020   severe, C3-4, 4-5, 5-6. Currently receiving injections by Dr. Vira Blanco of preferred pain management   Fibroid    Fibromyalgia     Hypertension    Lupus nephritis ([place])    Migraines    Neuropathy  Osteopenia 09/2017   T score -2.0 improved from prior study   Premature menopause age 18   following chemotherapy for Lupus   Shingles     PAST SURGICAL HISTORY: Past Surgical History:  Procedure Laterality Date   cervical fossa injections  2020   CESAREAN SECTION     COLPOSCOPY     HYSTEROSCOPY     Myomectomy   MYOMECTOMY     Hysteroscopic myomectomy   NASAL SINUS SURGERY  01/2016   TUBAL LIGATION      FAMILY HISTORY: Family History  Problem Relation Age of Onset   Hypertension Mother    Asthma Mother    COPD Mother    Diabetes Father    Hypertension Father    Heart disease Father     SOCIAL HISTORY: Social History   Socioeconomic History   Marital status: Married    Spouse name: [name]   Number of children: 2   Years of education: Secretary/administrator   Highest education level: Not on file  Occupational History    Employer: DISABLED    Comment: disability  Scientist, product/process development strain: Not on file   Food insecurity    Worry: Not on file    Inability: Not on file   Transportation needs    Medical: Not on file    Non-medical: Not on file  Tobacco Use   Smoking status: Never Smoker   Smokeless tobacco: Never Used  Substance and Sexual Activity   Alcohol use: No    Alcohol/week: 0.0 standard drinks   Drug use: No   Sexual activity: Not Currently    Birth control/protection: Surgical, Post-menopausal    Comment: Tubal lig-1st intercourse 53 yo-Fewer than 5 partners  Lifestyle   Physical activity    Days per week: Not on file    Minutes per session: Not on file   Stress: Not on file  Relationships   Social connections    Talks on phone: Not on file    Gets together: Not on file    Attends religious service: Not on file    Active member of club or organization: Not on file    Attends meetings of clubs or organizations: Not on file     Relationship status: Not on file   Intimate partner violence    Fear of current or ex partner: Not on file    Emotionally abused: Not on file    Physically abused: Not on file    Forced sexual activity: Not on file  Other Topics Concern   Not on file  Social History Narrative   Patient lives at home with her daughter. She is currently separated (not legally).   Caffeine Use: 1 cup of coffee daily.    Right-handed      PHYSICAL EXAM  Vitals:   10/31/18 0825  BP: 118/71  Pulse: (!) 54  Temp: (!) 96.4 F (35.8 C)  Weight: 161 lb 9.6 oz (73.3 kg)  Height: 5\' 4"  (1.626 m)   Body mass index is 27.74 kg/m.  Generalized: Well developed, in no acute distress  Neurological examination  Mentation: Alert oriented to time, place, history taking. Follows all commands speech and language fluent Cranial nerve II-XII: Pupils were equal round reactive to light. Extraocular movements were full, visual field were full on confrontational test. Facial sensation and strength were normal. Uvula tongue midline. Head turning and shoulder shrug  were normal and symmetric. Motor: The motor testing reveals 5 over 5 strength of  all 4 extremities. Good symmetric motor tone is noted throughout.  Sensory: Sensory testing is intact to soft touch on all 4 extremities. No evidence of extinction is noted.  Coordination: Cerebellar testing reveals good finger-nose-finger and heel-to-shin bilaterally.  Gait and station: Gait is normal.   DIAGNOSTIC DATA (LABS, IMAGING, TESTING) - I reviewed patient records, labs, notes, testing and imaging myself where available.  No flowsheet data found.   Lab Results  Component Value Date   WBC 3.6 (L) 07/01/2013   HGB 12.8 07/01/2013   HCT 39.3 07/01/2013   MCV 94.2 07/01/2013   PLT 188 07/01/2013      Component Value Date/Time   NA 140 09/10/2015 0833   NA 142 07/01/2013 1104   K 3.5 09/10/2015 0833   K 4.2 07/01/2013 1104   CL 105 09/10/2015 0833   CO2 27  09/10/2015 0833   CO2 26 07/01/2013 1104   GLUCOSE 61 (L) 09/10/2015 0833   GLUCOSE 94 07/01/2013 1104   BUN 18 09/10/2015 0833   BUN 12.7 07/01/2013 1104   CREATININE 0.98 09/10/2015 0833   CREATININE 0.9 07/01/2013 1104   CALCIUM 9.0 09/10/2015 0833   CALCIUM 9.5 07/01/2013 1104   PROT 6.0 (L) 09/10/2015 0833   PROT 6.7 07/01/2013 1104   ALBUMIN 3.6 09/10/2015 0833   ALBUMIN 3.9 07/01/2013 1104   AST 23 09/10/2015 0833   AST 47 (H) 07/01/2013 1104   ALT 20 09/10/2015 0833   ALT 36 07/01/2013 1104   ALKPHOS 61 09/10/2015 0833   ALKPHOS 80 07/01/2013 1104   BILITOT 0.4 09/10/2015 0833   BILITOT 0.48 07/01/2013 1104   GFRNONAA >60 12/14/2010 1851   GFRAA >60 12/14/2010 1851   No results found for: CHOL, HDL, LDLCALC, LDLDIRECT, TRIG, CHOLHDL Lab Results  Component Value Date   HGBA1C 5.4 09/10/2018   Lab Results  Component Value Date   VITAMINB12 >2000 (H) 09/10/2018   Lab Results  Component Value Date   TSH 1.27 09/10/2015    ASSESSMENT AND PLAN 53 y.o. year old female  has a past medical history of Arthritis, Atrophic vaginitis, Atypical chest pain, Baker's cyst (03/28/12), Chest pain, CIN I (cervical intraepithelial neoplasia I) (1990), Degenerative disc disease, cervical (2020), Fibroid, Fibromyalgia, Hypertension, Lupus nephritis ([place]), Migraines, Neuropathy, Osteopenia (09/2017), Premature menopause (age 19), and Shingles. here with     ICD-10-CM   1. Intractable migraine without aura and without status migrainosus  G43.019     Ermalinda has noticed increased frequency and intensity of migraines for the pat month. She contributes worsening to anxiety and stress. We will continue Ajovy injection and gabapentin for neuropathy. We will start venlafaxine 37.5mg  daily. I feel this may also help with anxiety and depression symptoms. She will see Jobie Quaker in October. Zomig for abortive therapy as prescribed. Avoid OTC analgesics. We have discussed potentia for rebound headaches  with newly prescribed Percocet multiple times a day for chronic pain. Also, headaches are potential side effect with phentermine. She verbalizes understanding. We will follow up in 3 months, sooner if needed.    No orders of the defined types were placed in this encounter.    Meds ordered this encounter  Medications   venlafaxine XR (EFFEXOR XR) 37.5 MG 24 hr capsule    Sig: Take 1 capsule (37.5 mg total) by mouth daily with breakfast.    Dispense:  30 capsule    Refill:  5    Order Specific Question:   Supervising Provider    Answer:  AHERN, ANTONIA B [[id number]]      I spent 15 minutes with the patient. 50% of this time was spent counseling and educating patient on plan of care and medications.    Debbora Presto, FNP-C 10/31/2018, 9:23 AM [facility] [address], [place], [place] [address] [phone]

## 2018-10-31 NOTE — Patient Instructions (Signed)
Continue Ajovy and gabapentin for prevention  Continue Zomig for abortive therapy  Start Effexor 37.5mg  daily with food  Follow up in 3 months   Venlafaxine tablets What is this medicine? VENLAFAXINE (VEN la fax een) is used to treat depression, anxiety and panic disorder. This medicine may be used for other purposes; ask your health care provider or pharmacist if you have questions. COMMON BRAND NAME(S): Effexor What should I tell my health care provider before I take this medicine? They need to know if you have any of these conditions:  bleeding disorders  glaucoma  heart disease  high blood pressure  high cholesterol  kidney disease  liver disease  low levels of sodium in the blood  mania or bipolar disorder  seizures  suicidal thoughts, plans, or attempt; a previous suicide attempt by you or a family  take medicines that treat or prevent blood clots  thyroid disease  an unusual or allergic reaction to venlafaxine, desvenlafaxine, other medicines, foods, dyes, or preservatives  pregnant or trying to get pregnant  breast-feeding How should I use this medicine? Take this medicine by mouth with a glass of water. Follow the directions on the prescription label. Take it with food. Take your medicine at regular intervals. Do not take your medicine more often than directed. Do not stop taking this medicine suddenly except upon the advice of your doctor. Stopping this medicine too quickly may cause serious side effects or your condition may worsen. A special MedGuide will be given to you by the pharmacist with each prescription and refill. Be sure to read this information carefully each time. Talk to your pediatrician regarding the use of this medicine in children. Special care may be needed. Overdosage: If you think you have taken too much of this medicine contact a poison control center or emergency room at once. NOTE: This medicine is only for you. Do not share  this medicine with others. What if I miss a dose? If you miss a dose, take it as soon as you can. If it is almost time for your next dose, take only that dose. Do not take double or extra doses. What may interact with this medicine? Do not take this medicine with any of the following medications:  certain medicines for fungal infections like fluconazole, itraconazole, ketoconazole, posaconazole, voriconazole  cisapride  desvenlafaxine  dronedarone  duloxetine  levomilnacipran  linezolid  MAOIs like Carbex, Eldepryl, Marplan, Nardil, and Parnate  methylene blue (injected into a vein)  milnacipran  pimozide  thioridazine This medicine may also interact with the following medications:  amphetamines  aspirin and aspirin-like medicines  certain medicines for depression, anxiety, or psychotic disturbances  certain medicines for migraine headaches like almotriptan, eletriptan, frovatriptan, naratriptan, rizatriptan, sumatriptan, zolmitriptan  certain medicines for sleep  certain medicines that treat or prevent blood clots like dalteparin, enoxaparin, warfarin  cimetidine  clozapine  diuretics  fentanyl  furazolidone  indinavir  isoniazid  lithium  metoprolol  NSAIDS, medicines for pain and inflammation, like ibuprofen or naproxen  other medicines that prolong the QT interval (cause an abnormal heart rhythm) like dofetilide, ziprasidone  procarbazine  rasagiline  supplements like St. John's wort, kava kava, valerian  tramadol  tryptophan This list may not describe all possible interactions. Give your health care provider a list of all the medicines, herbs, non-prescription drugs, or dietary supplements you use. Also tell them if you smoke, drink alcohol, or use illegal drugs. Some items may interact with your medicine. What should I  watch for while using this medicine? Tell your doctor if your symptoms do not get better or if they get worse. Visit  your doctor or health care professional for regular checks on your progress. Because it may take several weeks to see the full effects of this medicine, it is important to continue your treatment as prescribed by your doctor. Patients and their families should watch out for new or worsening thoughts of suicide or depression. Also watch out for sudden changes in feelings such as feeling anxious, agitated, panicky, irritable, hostile, aggressive, impulsive, severely restless, overly excited and hyperactive, or not being able to sleep. If this happens, especially at the beginning of treatment or after a change in dose, call your health care professional. This medicine can cause an increase in blood pressure. Check with your doctor for instructions on monitoring your blood pressure while taking this medicine. You may get drowsy or dizzy. Do not drive, use machinery, or do anything that needs mental alertness until you know how this medicine affects you. Do not stand or sit up quickly, especially if you are an older patient. This reduces the risk of dizzy or fainting spells. Alcohol may interfere with the effect of this medicine. Avoid alcoholic drinks. Your mouth may get dry. Chewing sugarless gum, sucking hard candy and drinking plenty of water will help. Contact your doctor if the problem does not go away or is severe. What side effects may I notice from receiving this medicine? Side effects that you should report to your doctor or health care professional as soon as possible:  allergic reactions like skin rash, itching or hives, swelling of the face, lips, or tongue  anxious  breathing problems  confusion  changes in vision  chest pain  confusion  elevated mood, decreased need for sleep, racing thoughts, impulsive behavior  eye pain  fast, irregular heartbeat  feeling faint or lightheaded, falls  feeling agitated, angry, or irritable  hallucination, loss of contact with reality  high  blood pressure  loss of balance or coordination  palpitations  redness, blistering, peeling or loosening of the skin, including inside the mouth  restlessness, pacing, inability to keep still  seizures  stiff muscles  suicidal thoughts or other mood changes  trouble passing urine or change in the amount of urine  trouble sleeping  unusual bleeding or bruising  unusually weak or tired  vomiting Side effects that usually do not require medical attention (report to your doctor or health care professional if they continue or are bothersome):  change in sex drive or performance  change in appetite or weight  constipation  dizziness  dry mouth  headache  increased sweating  nausea  tired This list may not describe all possible side effects. Call your doctor for medical advice about side effects. You may report side effects to FDA at 1-800-FDA-1088. Where should I keep my medicine? Keep out of the reach of children. Store at a controlled temperature between 20 and 25 degrees C (68 and 77 degrees F), in a dry place. Throw away any unused medicine after the expiration date. NOTE: This sheet is a summary. It may not cover all possible information. If you have questions about this medicine, talk to your doctor, pharmacist, or health care provider.  2020 Elsevier/Gold Standard (2018-03-05 12:08:23)   Major Depressive Disorder, Adult Major depressive disorder (MDD) is a mental health condition. MDD often makes you feel sad, hopeless, or helpless. MDD can also cause symptoms in your body. MDD can  affect your:  Work.  School.  Relationships.  Other normal activities. MDD can range from mild to very bad. It may occur once (single episode MDD). It can also occur many times (recurrent MDD). The main symptoms of MDD often include:  Feeling sad, depressed, or irritable most of the time.  Loss of interest. MDD symptoms also include:  Sleeping too much or too little.   Eating too much or too little.  A change in your weight.  Feeling tired (fatigue) or having low energy.  Feeling worthless.  Feeling guilty.  Trouble making decisions.  Trouble thinking clearly.  Thoughts of suicide or harming others.  Feeling weak.  Feeling agitated.  Keeping yourself from being around other people (isolation). Follow these instructions at home: Activity  Do these things as told by your doctor: ? Go back to your normal activities. ? Exercise regularly. ? Spend time outdoors. Alcohol  Talk with your doctor about how alcohol can affect your antidepressant medicines.  Do not drink alcohol. Or, limit how much alcohol you drink. ? This means no more than 1 drink a day for nonpregnant women and 2 drinks a day for men. One drink equals one of these:  12 oz of beer.  5 oz of wine.  1 oz of hard liquor. General instructions  Take over-the-counter and prescription medicines only as told by your doctor.  Eat a healthy diet.  Get plenty of sleep.  Find activities that you enjoy. Make time to do them.  Think about joining a support group. Your doctor may be able to suggest a group for you.  Keep all follow-up visits as told by your doctor. This is important. Where to find more information:  [employer] on Mental Illness: ? www.nami.[place]: ? https://carter.com/  National Suicide Prevention Lifeline: ? 862-429-8111. This is free, 24-hour help. Contact a doctor if:  Your symptoms get worse.  You have new symptoms. Get help right away if:  You self-harm.  You see, hear, taste, smell, or feel things that are not present (hallucinate). If you ever feel like you may hurt yourself or others, or have thoughts about taking your own life, get help right away. You can go to your nearest emergency department or call:  Your local emergency services (911 in the [place]  A suicide crisis helpline, such  as the National Suicide Prevention Lifeline: ? 603 757 9638. This is open 24 hours a day. This information is not intended to replace advice given to you by your health care provider. Make sure you discuss any questions you have with your health care provider. Document Released: 02/22/2015 Document Revised: 02/23/2017 Document Reviewed: 11/28/2015 Elsevier Patient Education  2020 [employer].   Migraine Headache A migraine headache is a very strong throbbing pain on one side or both sides of your head. This type of headache can also cause other symptoms. It can last from 4 hours to 3 days. Talk with your doctor about what things may bring on (trigger) this condition. What are the causes? The exact cause of this condition is not known. This condition may be triggered or caused by:  Drinking alcohol.  Smoking.  Taking medicines, such as: ? Medicine used to treat chest pain (nitroglycerin). ? Birth control pills. ? Estrogen. ? Some blood pressure medicines.  Eating or drinking certain products.  Doing physical activity. Other things that may trigger a migraine headache include:  Having a menstrual period.  Pregnancy.  Hunger.  Stress.  Not getting enough sleep or getting too much sleep.  Weather changes.  Tiredness (fatigue). What increases the risk?  Being 53-59 years old.  Being female.  Having a family history of migraine headaches.  Being Caucasian.  Having depression or anxiety.  Being very overweight. What are the signs or symptoms?  A throbbing pain. This pain may: ? Happen in any area of the head, such as on one side or both sides. ? Make it hard to do daily activities. ? Get worse with physical activity. ? Get worse around bright lights or loud noises.  Other symptoms may include: ? Feeling sick to your stomach (nauseous). ? Vomiting. ? Dizziness. ? Being sensitive to bright lights, loud noises, or smells.  Before you get a migraine  headache, you may get warning signs (an aura). An aura may include: ? Seeing flashing lights or having blind spots. ? Seeing bright spots, halos, or zigzag lines. ? Having tunnel vision or blurred vision. ? Having numbness or a tingling feeling. ? Having trouble talking. ? Having weak muscles.  Some people have symptoms after a migraine headache (postdromal phase), such as: ? Tiredness. ? Trouble thinking (concentrating). How is this treated?  Taking medicines that: ? Relieve pain. ? Relieve the feeling of being sick to your stomach. ? Prevent migraine headaches.  Treatment may also include: ? Having acupuncture. ? Avoiding foods that bring on migraine headaches. ? Learning ways to control your body functions (biofeedback). ? Therapy to help you know and deal with negative thoughts (cognitive behavioral therapy). Follow these instructions at home: Medicines  Take over-the-counter and prescription medicines only as told by your doctor.  Ask your doctor if the medicine prescribed to you: ? Requires you to avoid driving or using heavy machinery. ? Can cause trouble pooping (constipation). You may need to take these steps to prevent or treat trouble pooping:  Drink enough fluid to keep your pee (urine) pale yellow.  Take over-the-counter or prescription medicines.  Eat foods that are high in fiber. These include beans, whole grains, and fresh fruits and vegetables.  Limit foods that are high in fat and sugar. These include fried or sweet foods. Lifestyle  Do not drink alcohol.  Do not use any products that contain nicotine or tobacco, such as cigarettes, e-cigarettes, and chewing tobacco. If you need help quitting, ask your doctor.  Get at least 8 hours of sleep every night.  Limit and deal with stress. General instructions      Keep a journal to find out what may bring on your migraine headaches. For example, write down: ? What you eat and drink. ? How much sleep  you get. ? Any change in what you eat or drink. ? Any change in your medicines.  If you have a migraine headache: ? Avoid things that make your symptoms worse, such as bright lights. ? It may help to lie down in a dark, quiet room. ? Do not drive or use heavy machinery. ? Ask your doctor what activities are safe for you.  Keep all follow-up visits as told by your doctor. This is important. Contact a doctor if:  You get a migraine headache that is different or worse than others you have had.  You have more than 15 headache days in one month. Get help right away if:  Your migraine headache gets very bad.  Your migraine headache lasts longer than 72 hours.  You have a fever.  You have a stiff neck.  You  have trouble seeing.  Your muscles feel weak or like you cannot control them.  You start to lose your balance a lot.  You start to have trouble walking.  You pass out (faint).  You have a seizure. Summary  A migraine headache is a very strong throbbing pain on one side or both sides of your head. These headaches can also cause other symptoms.  This condition may be treated with medicines and changes to your lifestyle.  Keep a journal to find out what may bring on your migraine headaches.  Contact a doctor if you get a migraine headache that is different or worse than others you have had.  Contact your doctor if you have more than 15 headache days in a month. This information is not intended to replace advice given to you by your health care provider. Make sure you discuss any questions you have with your health care provider. Document Released: 12/21/2007 Document Revised: 07/05/2018 Document Reviewed: 04/25/2018 Elsevier Patient Education  2020 [employer].

## 2018-11-04 NOTE — Telephone Encounter (Signed)
This encounter was created in error - please disregard.

## 2018-11-17 NOTE — Progress Notes (Signed)
Made any corrections needed, and agree with history, physical, neuro exam,assessment and plan as stated.     Antonia Ahern, MD [facility]  

## 2018-11-18 ENCOUNTER — Other Ambulatory Visit: Payer: Self-pay | Admitting: Neurology

## 2018-11-21 ENCOUNTER — Other Ambulatory Visit: Payer: Self-pay

## 2018-11-21 ENCOUNTER — Ambulatory Visit (INDEPENDENT_AMBULATORY_CARE_PROVIDER_SITE_OTHER): Payer: 59 | Admitting: *Deleted

## 2018-11-21 VITALS — Temp 95.3°F

## 2018-11-21 DIAGNOSIS — G43009 Migraine without aura, not intractable, without status migrainosus: Secondary | ICD-10-CM | POA: Diagnosis not present

## 2018-11-21 DIAGNOSIS — Z741 Need for assistance with personal care: Secondary | ICD-10-CM

## 2018-11-21 NOTE — Progress Notes (Signed)
Patient given her 3 scheduled injections of Ajovy 225 mg each. Total 675 mg. She brought these from home. They were administered in 3 separate areas in herLLQ of abdomen. Pt tolerated well and aseptic technique used. Next injections due in 3 months. She will schedule her next injection.   225 mg x 2: LOT- [id number], EXP: [id number] 225 mg x 1: LOT- [id number], EXP: [id number]

## 2018-12-04 ENCOUNTER — Ambulatory Visit
Admission: RE | Admit: 2018-12-04 | Discharge: 2018-12-04 | Disposition: A | Discharge status: Home / Self Care | Payer: 59 | Source: Ambulatory Visit | Attending: Gynecology | Admitting: Gynecology

## 2018-12-04 ENCOUNTER — Other Ambulatory Visit: Payer: Self-pay

## 2018-12-04 DIAGNOSIS — Z1231 Encounter for screening mammogram for malignant neoplasm of breast: Secondary | ICD-10-CM

## 2018-12-09 DIAGNOSIS — I10 Essential (primary) hypertension: Secondary | ICD-10-CM | POA: Diagnosis not present

## 2018-12-09 DIAGNOSIS — Z0000 Encounter for general adult medical examination without abnormal findings: Secondary | ICD-10-CM | POA: Diagnosis not present

## 2018-12-09 DIAGNOSIS — E785 Hyperlipidemia, unspecified: Secondary | ICD-10-CM | POA: Diagnosis not present

## 2018-12-16 DIAGNOSIS — F329 Major depressive disorder, single episode, unspecified: Secondary | ICD-10-CM | POA: Diagnosis not present

## 2018-12-16 DIAGNOSIS — I10 Essential (primary) hypertension: Secondary | ICD-10-CM | POA: Diagnosis not present

## 2018-12-17 ENCOUNTER — Encounter: Payer: Self-pay | Admitting: Family Medicine

## 2018-12-18 ENCOUNTER — Encounter: Payer: Self-pay | Admitting: Gynecology

## 2018-12-19 ENCOUNTER — Other Ambulatory Visit: Payer: Self-pay | Admitting: Family Medicine

## 2018-12-19 MED ORDER — NURTEC 75 MG PO TBDP: 75.0000 mg | ORAL_TABLET | Freq: Every day | ORAL | 11 refills | Status: DC | PRN

## 2018-12-24 ENCOUNTER — Encounter: Payer: Self-pay | Admitting: *Deleted

## 2018-12-24 ENCOUNTER — Telehealth: Payer: Self-pay | Admitting: *Deleted

## 2018-12-24 NOTE — Telephone Encounter (Signed)
Nurtec coverage denied: the requested med and /or diagnosis are not a covered benefit and excluded from coverage in accordance with the terms and conditions of your plan benefit. Will message patient and make sure she can use savings card.

## 2018-12-24 NOTE — Telephone Encounter (Signed)
Started Nrtec PA on  [facility], [id number], dx: [id number], tried: imitrex, nortriptyline, topamax, depakote, zomig, venlafaxine. Information sent to [facility]. [facility] is reviewing your PA request. Typically an electronic response will be received within 72 hours. To check for an update later, open this request from your dashboard

## 2018-12-24 NOTE — Telephone Encounter (Signed)
Received my chart reply for patient that she did get Nurtec and her insurance paid for it.

## 2019-01-13 DIAGNOSIS — I10 Essential (primary) hypertension: Secondary | ICD-10-CM | POA: Diagnosis not present

## 2019-01-13 DIAGNOSIS — Z23 Encounter for immunization: Secondary | ICD-10-CM | POA: Diagnosis not present

## 2019-01-13 DIAGNOSIS — R05 Cough: Secondary | ICD-10-CM | POA: Diagnosis not present

## 2019-01-13 DIAGNOSIS — F329 Major depressive disorder, single episode, unspecified: Secondary | ICD-10-CM | POA: Diagnosis not present

## 2019-01-20 DIAGNOSIS — I10 Essential (primary) hypertension: Secondary | ICD-10-CM | POA: Diagnosis not present

## 2019-01-20 DIAGNOSIS — M329 Systemic lupus erythematosus, unspecified: Secondary | ICD-10-CM | POA: Diagnosis not present

## 2019-01-20 DIAGNOSIS — Z87448 Personal history of other diseases of urinary system: Secondary | ICD-10-CM | POA: Diagnosis not present

## 2019-01-27 DIAGNOSIS — M542 Cervicalgia: Secondary | ICD-10-CM | POA: Diagnosis not present

## 2019-01-27 DIAGNOSIS — M810 Age-related osteoporosis without current pathological fracture: Secondary | ICD-10-CM | POA: Diagnosis not present

## 2019-01-27 DIAGNOSIS — Z79899 Other long term (current) drug therapy: Secondary | ICD-10-CM | POA: Diagnosis not present

## 2019-01-27 DIAGNOSIS — M25561 Pain in right knee: Secondary | ICD-10-CM | POA: Diagnosis not present

## 2019-01-27 DIAGNOSIS — M79642 Pain in left hand: Secondary | ICD-10-CM | POA: Diagnosis not present

## 2019-01-27 DIAGNOSIS — M25562 Pain in left knee: Secondary | ICD-10-CM | POA: Diagnosis not present

## 2019-01-27 DIAGNOSIS — M329 Systemic lupus erythematosus, unspecified: Secondary | ICD-10-CM | POA: Diagnosis not present

## 2019-01-27 DIAGNOSIS — M79641 Pain in right hand: Secondary | ICD-10-CM | POA: Diagnosis not present

## 2019-01-27 DIAGNOSIS — M1990 Unspecified osteoarthritis, unspecified site: Secondary | ICD-10-CM | POA: Diagnosis not present

## 2019-01-27 DIAGNOSIS — M797 Fibromyalgia: Secondary | ICD-10-CM | POA: Diagnosis not present

## 2019-01-30 ENCOUNTER — Ambulatory Visit: Payer: 59 | Admitting: Family Medicine

## 2019-01-30 ENCOUNTER — Encounter: Payer: Self-pay | Admitting: Family Medicine

## 2019-01-30 ENCOUNTER — Telehealth: Payer: Self-pay

## 2019-01-30 NOTE — Telephone Encounter (Signed)
Patient was a no call/no show for their appointment today.   

## 2019-02-01 ENCOUNTER — Encounter: Payer: Self-pay | Admitting: Family Medicine

## 2019-02-10 DIAGNOSIS — R05 Cough: Secondary | ICD-10-CM | POA: Diagnosis not present

## 2019-02-10 DIAGNOSIS — G43909 Migraine, unspecified, not intractable, without status migrainosus: Secondary | ICD-10-CM | POA: Diagnosis not present

## 2019-02-10 DIAGNOSIS — M329 Systemic lupus erythematosus, unspecified: Secondary | ICD-10-CM | POA: Diagnosis not present

## 2019-02-10 DIAGNOSIS — E559 Vitamin D deficiency, unspecified: Secondary | ICD-10-CM | POA: Diagnosis not present

## 2019-02-10 DIAGNOSIS — Z79899 Other long term (current) drug therapy: Secondary | ICD-10-CM | POA: Diagnosis not present

## 2019-02-10 DIAGNOSIS — I10 Essential (primary) hypertension: Secondary | ICD-10-CM | POA: Diagnosis not present

## 2019-02-10 DIAGNOSIS — E041 Nontoxic single thyroid nodule: Secondary | ICD-10-CM | POA: Diagnosis not present

## 2019-02-14 DIAGNOSIS — F331 Major depressive disorder, recurrent, moderate: Secondary | ICD-10-CM | POA: Diagnosis not present

## 2019-02-14 DIAGNOSIS — F419 Anxiety disorder, unspecified: Secondary | ICD-10-CM | POA: Diagnosis not present

## 2019-02-17 DIAGNOSIS — M542 Cervicalgia: Secondary | ICD-10-CM | POA: Diagnosis not present

## 2019-02-17 DIAGNOSIS — M6281 Muscle weakness (generalized): Secondary | ICD-10-CM | POA: Diagnosis not present

## 2019-02-17 DIAGNOSIS — M25562 Pain in left knee: Secondary | ICD-10-CM | POA: Diagnosis not present

## 2019-02-17 DIAGNOSIS — M25561 Pain in right knee: Secondary | ICD-10-CM | POA: Diagnosis not present

## 2019-02-18 ENCOUNTER — Ambulatory Visit (INDEPENDENT_AMBULATORY_CARE_PROVIDER_SITE_OTHER): Payer: 59 | Admitting: *Deleted

## 2019-02-18 ENCOUNTER — Other Ambulatory Visit: Payer: Self-pay

## 2019-02-18 VITALS — Temp 97.0°F

## 2019-02-18 DIAGNOSIS — G43009 Migraine without aura, not intractable, without status migrainosus: Secondary | ICD-10-CM

## 2019-02-18 DIAGNOSIS — Z741 Need for assistance with personal care: Secondary | ICD-10-CM

## 2019-02-18 NOTE — Progress Notes (Signed)
Patient was given her 3 scheduled injections of Ajovy 225 mg each. Total 675 mg. She brought these from home. They were administered in RUQ of abdomen. Pt tolerated well and aseptic technique used. Next injections scheduled for 05/21/19 @ 8:15 AM.  225 mg x 3: LOT- [id number], EXP: [id number]

## 2019-02-24 DIAGNOSIS — M6281 Muscle weakness (generalized): Secondary | ICD-10-CM | POA: Diagnosis not present

## 2019-02-24 DIAGNOSIS — M542 Cervicalgia: Secondary | ICD-10-CM | POA: Diagnosis not present

## 2019-02-24 DIAGNOSIS — M25562 Pain in left knee: Secondary | ICD-10-CM | POA: Diagnosis not present

## 2019-02-24 DIAGNOSIS — M25561 Pain in right knee: Secondary | ICD-10-CM | POA: Diagnosis not present

## 2019-03-03 DIAGNOSIS — M25562 Pain in left knee: Secondary | ICD-10-CM | POA: Diagnosis not present

## 2019-03-03 DIAGNOSIS — M25561 Pain in right knee: Secondary | ICD-10-CM | POA: Diagnosis not present

## 2019-03-03 DIAGNOSIS — M6281 Muscle weakness (generalized): Secondary | ICD-10-CM | POA: Diagnosis not present

## 2019-03-03 DIAGNOSIS — M542 Cervicalgia: Secondary | ICD-10-CM | POA: Diagnosis not present

## 2019-03-11 ENCOUNTER — Encounter: Payer: Self-pay | Admitting: Family Medicine

## 2019-03-11 ENCOUNTER — Ambulatory Visit (INDEPENDENT_AMBULATORY_CARE_PROVIDER_SITE_OTHER): Payer: 59 | Admitting: Family Medicine

## 2019-03-11 VITALS — BP 154/97 | HR 56 | Temp 97.3°F | Ht 64.0 in | Wt 169.0 lb

## 2019-03-11 DIAGNOSIS — F411 Generalized anxiety disorder: Secondary | ICD-10-CM

## 2019-03-11 DIAGNOSIS — G8929 Other chronic pain: Secondary | ICD-10-CM

## 2019-03-11 DIAGNOSIS — I10 Essential (primary) hypertension: Secondary | ICD-10-CM

## 2019-03-11 DIAGNOSIS — G629 Polyneuropathy, unspecified: Secondary | ICD-10-CM | POA: Diagnosis not present

## 2019-03-11 DIAGNOSIS — G43009 Migraine without aura, not intractable, without status migrainosus: Secondary | ICD-10-CM

## 2019-03-11 MED ORDER — GABAPENTIN 600 MG PO TABS: ORAL_TABLET | ORAL | 1 refills | Status: DC

## 2019-03-11 NOTE — Patient Instructions (Addendum)
We will continue Ajovy every three months  We will increase gabapentin to 600mg  in am, 600mg  at lunch and 1200mg  at bedtime                                                     Continue Nurtec for abortive therapy   Please follow up with me in 3 months, sooner if needed      Migraine Headache A migraine headache is a very strong throbbing pain on one side or both sides of your head. This type of headache can also cause other symptoms. It can last from 4 hours to 3 days. Talk with your doctor about what things may bring on (trigger) this condition. What are the causes? The exact cause of this condition is not known. This condition may be triggered or caused by:  Drinking alcohol.  Smoking.  Taking medicines, such as: ? Medicine used to treat chest pain (nitroglycerin). ? Birth control pills. ? Estrogen. ? Some blood pressure medicines.  Eating or drinking certain products.  Doing physical activity. Other things that may trigger a migraine headache include:  Having a menstrual period.  Pregnancy.  Hunger.  Stress.  Not getting enough sleep or getting too much sleep.  Weather changes.  Tiredness (fatigue). What increases the risk?  Being 27-45 years old.  Being female.  Having a family history of migraine headaches.  Being Caucasian.  Having depression or anxiety.  Being very overweight. What are the signs or symptoms?  A throbbing pain. This pain may: ? Happen in any area of the head, such as on one side or both sides. ? Make it hard to do daily activities. ? Get worse with physical activity. ? Get worse around bright lights or loud noises.  Other symptoms may include: ? Feeling sick to your stomach (nauseous). ? Vomiting. ? Dizziness. ? Being sensitive to bright lights, loud noises, or smells.  Before you get a migraine headache, you may get warning signs (an aura). An aura may include: ? Seeing flashing lights or having blind spots. ? Seeing  bright spots, halos, or zigzag lines. ? Having tunnel vision or blurred vision. ? Having numbness or a tingling feeling. ? Having trouble talking. ? Having weak muscles.  Some people have symptoms after a migraine headache (postdromal phase), such as: ? Tiredness. ? Trouble thinking (concentrating). How is this treated?  Taking medicines that: ? Relieve pain. ? Relieve the feeling of being sick to your stomach. ? Prevent migraine headaches.  Treatment may also include: ? Having acupuncture. ? Avoiding foods that bring on migraine headaches. ? Learning ways to control your body functions (biofeedback). ? Therapy to help you know and deal with negative thoughts (cognitive behavioral therapy). Follow these instructions at home: Medicines  Take over-the-counter and prescription medicines only as told by your doctor.  Ask your doctor if the medicine prescribed to you: ? Requires you to avoid driving or using heavy machinery. ? Can cause trouble pooping (constipation). You may need to take these steps to prevent or treat trouble pooping:  Drink enough fluid to keep your pee (urine) pale yellow.  Take over-the-counter or prescription medicines.  Eat foods that are high in fiber. These include beans, whole grains, and fresh fruits and vegetables.  Limit foods that are high in fat and sugar. These include fried  or sweet foods. Lifestyle  Do not drink alcohol.  Do not use any products that contain nicotine or tobacco, such as cigarettes, e-cigarettes, and chewing tobacco. If you need help quitting, ask your doctor.  Get at least 8 hours of sleep every night.  Limit and deal with stress. General instructions      Keep a journal to find out what may bring on your migraine headaches. For example, write down: ? What you eat and drink. ? How much sleep you get. ? Any change in what you eat or drink. ? Any change in your medicines.  If you have a migraine headache: ? Avoid  things that make your symptoms worse, such as bright lights. ? It may help to lie down in a dark, quiet room. ? Do not drive or use heavy machinery. ? Ask your doctor what activities are safe for you.  Keep all follow-up visits as told by your doctor. This is important. Contact a doctor if:  You get a migraine headache that is different or worse than others you have had.  You have more than 15 headache days in one month. Get help right away if:  Your migraine headache gets very bad.  Your migraine headache lasts longer than 72 hours.  You have a fever.  You have a stiff neck.  You have trouble seeing.  Your muscles feel weak or like you cannot control them.  You start to lose your balance a lot.  You start to have trouble walking.  You pass out (faint).  You have a seizure. Summary  A migraine headache is a very strong throbbing pain on one side or both sides of your head. These headaches can also cause other symptoms.  This condition may be treated with medicines and changes to your lifestyle.  Keep a journal to find out what may bring on your migraine headaches.  Contact a doctor if you get a migraine headache that is different or worse than others you have had.  Contact your doctor if you have more than 15 headache days in a month. This information is not intended to replace advice given to you by your health care provider. Make sure you discuss any questions you have with your health care provider. Document Released: 12/21/2007 Document Revised: 07/05/2018 Document Reviewed: 04/25/2018 Elsevier Patient Education  2020 [employer].

## 2019-03-11 NOTE — Progress Notes (Addendum)
PATIENT: Marie Peterson DOB: 01/26/1966  REASON FOR VISIT: follow up HISTORY FROM: patient  Chief Complaint  Patient presents with  . Follow-up    RM 2, alone. No questions No concerns. Stressed with family health matters, states its causing migraines.      HISTORY OF PRESENT ILLNESS: Today 03/11/19 Marie Peterson is a 52 y.o. female here today for follow up. She is under more stress recently with her dad's new diagnosis of prostate and brain cancer. She is one of 12 children and feels that they all look to her for support. She has noticed headaches are worse. Same headache but more frequent. She continues to do well with Ajovy every three months, gabapentin 600mg  three times daily and Nurtec for abortive therapy. BP has been elevated. She is currently separated from her husband. She is followed by [name]. Effexor was recently switched to Trintellix. She feels that Trintellix is making her sick on her stomach. She is followed closely by  PCP as well. She is taking hydralazine, amlodipine and benazepril for HTN. She is on chronic opioids for pain management.   HISTORY: (copied from my note on 10/31/2018)  Marie Peterson is a 53 y.o. female here today for follow up. She has had an increase in migraines. She is having daily headaches. She reports at least 2-3 migrainous headaches each week with light/sound sensitivity and nausea. Heat and stress make headaches worse. She is going through a separation currently and has noticed migraines are worse over the past month.  She is using Ajovy injections (3injections every three months). She is taking gabapentin 600mg  TID for neuropathy. She is taking Percocet 7.5/325mg  for severe DDD prescribed by PCP. She is also seeing rheumatology for Lupus.  She uses Zomig for abortive therapy that does ease up the headache. She occasionally takes Zofran for nausea with migraine.   She is taking Belsomra for sleep. She does not feel it is helping her. She continues  to wake frequently throughout the night. She does not snore. She is admittedly more depressed. She has not spoken with PCP due to him being out of the office for medical leave. She is not taking any antidepressants at this time. She does not remember being on anything in the past with the exception of Cymbalta for pain. This medications did cause mild lip swelling. No respiratory involvement , no swollen tongue or airway. No trouble breathing.   HISTORY: (copied from Dr Cathren Laine note on 09/10/2018)  Interval history 09/10/2018: She is here for a new issues, neuropathy in the hands and feet, poor balance.She has a past medical history of migraines, hypertension, small fiber neuropathy diagnosed 6 years ago by Dr. Carlyon Shadow, arthritis, lupus nephritis, history of systemic lupus. I reviewed her chart, in 2014 Dr. Leta Baptist did extensive work-up including ceruloplasmin, copper, IFE and PE, Lyme, sedimentation rate, CRP, B6, angiotensin-converting enzyme, hep C, hep B, HIV, B1, it did not show any other etiology. She saw Dr. Carlyon Shadow for burning sensation in the feet bilaterally, she had used compounded neuropathy cream and gabapentin. EMG MRI all labs were negative. She complained of diffuse numbness and pain in the arms and legs since March 2012. She has been on gabapentin since 2012 when she was diagnosed with shingles after her right torso.  She has tingling in the hands and feet and her hands get weak at times and she drops things. The same symptoms, no progression. She also has a lot of stress which makes  it worse. It waxes and wanes, can go away for a few months and come back. Worse with stress. She is not working, she is on disability, worse with use of hands or on the computer. She has CTS in both hands, she has had it for a long time.  Interval history 12/26/2016: The nasal powder works (imitrex Animator) use it 3 days a month for severe migraines, Out of 30 days, she has 15 headache days a  month. No OTC medications. No medication overuse. No snoring, not excessive fatigue during the day. No morning headaches.   Interval history 05/08/2016: Patient is here for follow up on migraines. She has a PMHx of lupus with glomerulonephritis, osteoarthritis, shingles. She has been seen here in the office for small-fiber neuropathy as well as migraines.She could not tolerate 25mg  of nortriptyline at night and was placed on 10mg  at night. She did not tolerate the Topamax. Depakote with side effects as well. She is using triptan and zofran for migraines. She is on Lyrica for post-herpetic neuralgia. She had recent sinus surgery with Dr. Minna Merritts. She still has migraines but they are improved. The headaches can linger on and the imitrex helps but doesn;t take it away, zofran helps with the nausea. The migraines progress over an hour to maxumum severity, discussed nose sprays or pwders or injectable but prefers pills.   Marie Peterson a 52 y.o.femalehere as a referral from Dr. Wonda Olds continued migraines. Migraines started worsening about 2 weeks ago. Before this she was very well controlled. She had migraines every day. She had side effects to the Topamax, withincrease started getting double vision, she went back to 50mg  bid. She feels she has memory loss with the Topamax. She has insomnia, she takes [place] every night, she still has problems. Migraines are on the left side. Pounding in her head. They last most of the day. Daily. +light sensitivity, +sound sensitivity. +nausea. No vomiting. They can be up to 10/10, last week it was awful 10/10 usually 6/10 on average of pain. Imitrex usually works. Advised to take with a zofran. Discussed triggers, discussed other medication possibilities.    Last visit:She has a PMHx of lupus with glomerulonephritis, osteoarthritis, shingles. She has been seen here in the office for small-fiber neuropathy as well as migraines. She is on the Topamax. She  gets migraines twice a month. Migraines are more on the right. She has nausea, light sensitivity. No aura. They can last all day. She is in bed most of the day. She takes the imitrex at the onset and it helps. No side effects from the topamax and imitrex. She uses the Lidoderm patches for her post herpetic neuralgia but it was $75. It gets worse in the winter time, now it is ok. Pain is better in the summer. Lyrica helped a lot. She is having pains in her feet at the bottom and it wakes her up. She has sharp pains in the heels. The pain is when she walks on her heel. And is tender at night in the heel with sharp shooting pains in the heel. Pain right >left heel.   Medications tried for migraines: Imitrex, Topamax (50 qam and 100mg  qpm, had side effects of memory loss and blurry vision),imitrex  REVIEW OF SYSTEMS: Out of a complete 14 system review of symptoms, the patient complains only of the following symptoms, headaches and all other reviewed systems are negative.  ALLERGIES: Allergies  Allergen Reactions  . Cymbalta [Duloxetine Hcl]  Lip swelling  . Myrbetriq [Mirabegron] Itching    HOME MEDICATIONS: Outpatient Medications Prior to Visit  Medication Sig Dispense Refill  . AJOVY 225 MG/1.5ML SOSY INJECT 675 MILLIGRAM SUBCUTANEOUSLY EVERY 3 MONTHS 4.5 mL 11  . alendronate (FOSAMAX) 70 MG tablet TAKE 1 TABLET BY MOUTH WITH A FULL GLASS OF WATER ON AN EMPTY STOMACH EVERY WEEK 4 tablet 12  . amLODipine (NORVASC) 5 MG tablet Take 5 mg by mouth daily.     [name] Kitchen aspirin 81 MG tablet Take 81 mg by mouth daily.    . BELSOMRA 10 MG TABS 20 mg.   0  . benazepril (LOTENSIN) 40 MG tablet Take 1 tablet by mouth daily.  1  . Cholecalciferol (VITAMIN D PO) Take 2,000 Int'l Units by mouth daily.     . diclofenac sodium (VOLTAREN) 1 % GEL Apply topically.    [name] Kitchen FERREX 150 150 MG capsule Take 1 tablet by mouth daily.  1  . folic acid (FOLVITE) A999333 MCG tablet Take 400 mcg by mouth daily.    . furosemide  (LASIX) 40 MG tablet Take 1 tablet by mouth 2 (two) times daily. TAKE 1 AND 1/2 TABLET IN THE MORNING AND 1 TABLET AT NIGHT.  0  . hydrALAZINE (APRESOLINE) 10 MG tablet Take 10 mg by mouth 2 (two) times daily.    . hydroxychloroquine (PLAQUENIL) 200 MG tablet Take 200 mg by mouth 2 (two) times daily.   0  . L-Methylfolate-B6-B12 (METANX PO) Take 1 tablet by mouth 2 (two) times daily.     [name] Kitchen lidocaine (LIDODERM) 5 % Place 3 patches onto the skin daily. Remove & Discard patch within 12 hours or as directed by MD (Patient taking differently: Place 2 patches onto the skin daily. Remove & Discard patch within 12 hours or as directed by MD For use as needed) 90 patch 6  . magnesium oxide (MAG-OX) 400 (241.3 Mg) MG tablet Take 1 tablet (400 mg total) by mouth daily. Please schedule appointment for refills 90 tablet 3  . Multiple Vitamin (MULTIVITAMIN) tablet Take 1 tablet by mouth daily.    . nabumetone (RELAFEN) 500 MG tablet Take 500 mg by mouth 2 (two) times daily as needed.  0  . Omega-3 Fatty Acids (FISH OIL) 1000 MG CAPS Take 2 capsules by mouth daily.    [name] Kitchen omeprazole (PRILOSEC) 40 MG capsule Take 40 mg by mouth daily.  1  . ondansetron (ZOFRAN-ODT) 4 MG disintegrating tablet DISSOLVE 1 TABLET ON TONGUE EVERY 8 HOURS IF NEEDED FOR NAUSEA AND VOMITING 20 tablet 11  . oxyCODONE-acetaminophen (PERCOCET) 7.5-325 MG tablet Take 1 tablet by mouth every 8 (eight) hours.    . phentermine (ADIPEX-P) 37.5 MG tablet TK 1 T PO QD    . potassium chloride SA (K-DUR,KLOR-CON) 20 MEQ tablet Take 1 tablet (20 mEq total) by mouth daily. NEED OV. 90 tablet 3  . predniSONE (DELTASONE) 5 MG tablet Take 5 mg by mouth daily.    . Rimegepant Sulfate (NURTEC) 75 MG TBDP Take 75 mg by mouth daily as needed (take for abortive therapy of migraine, no more than 1 tablet in 24 hours or 10 per month). 10 tablet 11  . tolterodine (DETROL LA) 4 MG 24 hr capsule Take 4 mg by mouth daily.    [name] Kitchen vortioxetine HBr (TRINTELLIX) 10 MG TABS  tablet Take 10 mg by mouth daily.    [name] Kitchen gabapentin (NEURONTIN) 600 MG tablet TAKE 1 TABLET BY MOUTH THREE TIMES DAILY 270 tablet 1  .  ONZETRA XSAIL 11 MG/NOSEPC EXHP INSTILL 1 SPRAY IN EACH NOSTRIL AT THE ONSET OF MIGRAINE, MAY REPEAT ONCE MORE AFTER 2 HOURS AS NEEDED 16 each 0  . SUMAtriptan (IMITREX) 100 MG tablet TAKE 1 TABLET BY MOUTH AT ONSET OF HEADACHE MAY REPEAT IN 2 HOURS IF HEADACHE CONTINUES 10 tablet 11  . phentermine 37.5 MG capsule Take 37.5 mg by mouth every morning.     No facility-administered medications prior to visit.    PAST MEDICAL HISTORY: Past Medical History:  Diagnosis Date  . Arthritis   . Atrophic vaginitis   . Atypical chest pain    thought to be GERD; myoview 05/17/07-no significant ischemia; echo 07/20/11- normal EF=>55%  . Baker's cyst 03/28/12   venous doppler 03/28/12=no thrombus; baker's cyst at left popliteal fossa  . Chest pain   . CIN I (cervical intraepithelial neoplasia I) 1990  . Degenerative disc disease, cervical 2020   severe, C3-4, 4-5, 5-6. Currently receiving injections by Dr. Vira Blanco of preferred pain management  . Fibroid   . Fibromyalgia   . Hypertension   . Lupus nephritis ([place])   . Migraines   . Neuropathy   . Osteopenia 09/2017   T score -2.0 improved from prior study  . Premature menopause age 28   following chemotherapy for Lupus  . Shingles     PAST SURGICAL HISTORY: Past Surgical History:  Procedure Laterality Date  . cervical fossa injections  2020  . CESAREAN SECTION    . COLPOSCOPY    . HYSTEROSCOPY     Myomectomy  . MYOMECTOMY     Hysteroscopic myomectomy  . NASAL SINUS SURGERY  01/2016  . TUBAL LIGATION      FAMILY HISTORY: Family History  Problem Relation Age of Onset  . Hypertension Mother   . Asthma Mother   . COPD Mother   . Diabetes Father   . Hypertension Father   . Heart disease Father     SOCIAL HISTORY: Social History   Socioeconomic History  . Marital status: Married    Spouse name: [name]   . Number of children: 2  . Years of education: College  . Highest education level: Not on file  Occupational History    Employer: DISABLED    Comment: disability  Tobacco Use  . Smoking status: Never Smoker  . Smokeless tobacco: Never Used  Substance and Sexual Activity  . Alcohol use: No    Alcohol/week: 0.0 standard drinks  . Drug use: No  . Sexual activity: Not Currently    Birth control/protection: Surgical, Post-menopausal    Comment: Tubal lig-1st intercourse 53 yo-Fewer than 5 partners  Other Topics Concern  . Not on file  Social History Narrative   Patient lives at home with her daughter. She is currently separated (not legally).   Caffeine Use: 1 cup of coffee daily.    Right-handed   Social Determinants of Health   Financial Resource Strain:   . Difficulty of Paying Living Expenses: Not on file  Food Insecurity:   . Worried About Charity fundraiser in the Last Year: Not on file  . Ran Out of Food in the Last Year: Not on file  Transportation Needs:   . Lack of Transportation (Medical): Not on file  . Lack of Transportation (Non-Medical): Not on file  Physical Activity:   . Days of Exercise per Week: Not on file  . Minutes of Exercise per Session: Not on file  Stress:   . Feeling of Stress :  Not on file  Social Connections:   . Frequency of Communication with Friends and Family: Not on file  . Frequency of Social Gatherings with Friends and Family: Not on file  . Attends Religious Services: Not on file  . Active Member of Clubs or Organizations: Not on file  . Attends Archivist Meetings: Not on file  . Marital Status: Not on file  Intimate Partner Violence:   . Fear of Current or Ex-Partner: Not on file  . Emotionally Abused: Not on file  . Physically Abused: Not on file  . Sexually Abused: Not on file      PHYSICAL EXAM  Vitals:   03/11/19 1512  BP: (!) 154/97  Pulse: (!) 56  Temp: (!) 97.3 F (36.3 C)  Weight: 169 lb (76.7 kg)    Height: 5\' 4"  (1.626 m)   Body mass index is 29.01 kg/m.  Generalized: Well developed, in no acute distress  Cardiology: normal rate and rhythm, no murmur noted Respiratory: clear to auscultation bilaterally  Neurological examination  Mentation: Alert oriented to time, place, history taking. Follows all commands speech and language fluent Cranial nerve II-XII: Pupils were equal round reactive to light. Extraocular movements were full, visual field were full on confrontational test.  Motor: The motor testing reveals 5 over 5 strength of all 4 extremities. Good symmetric motor tone is noted throughout.  Gait and station: Gait is normal.    DIAGNOSTIC DATA (LABS, IMAGING, TESTING) - I reviewed patient records, labs, notes, testing and imaging myself where available.  No flowsheet data found.   Lab Results  Component Value Date   WBC 3.6 (L) 07/01/2013   HGB 12.8 07/01/2013   HCT 39.3 07/01/2013   MCV 94.2 07/01/2013   PLT 188 07/01/2013      Component Value Date/Time   NA 140 09/10/2015 0833   NA 142 07/01/2013 1104   K 3.5 09/10/2015 0833   K 4.2 07/01/2013 1104   CL 105 09/10/2015 0833   CO2 27 09/10/2015 0833   CO2 26 07/01/2013 1104   GLUCOSE 61 (L) 09/10/2015 0833   GLUCOSE 94 07/01/2013 1104   BUN 18 09/10/2015 0833   BUN 12.7 07/01/2013 1104   CREATININE 0.98 09/10/2015 0833   CREATININE 0.9 07/01/2013 1104   CALCIUM 9.0 09/10/2015 0833   CALCIUM 9.5 07/01/2013 1104   PROT 6.0 (L) 09/10/2015 0833   PROT 6.7 07/01/2013 1104   ALBUMIN 3.6 09/10/2015 0833   ALBUMIN 3.9 07/01/2013 1104   AST 23 09/10/2015 0833   AST 47 (H) 07/01/2013 1104   ALT 20 09/10/2015 0833   ALT 36 07/01/2013 1104   ALKPHOS 61 09/10/2015 0833   ALKPHOS 80 07/01/2013 1104   BILITOT 0.4 09/10/2015 0833   BILITOT 0.48 07/01/2013 1104   GFRNONAA >60 12/14/2010 1851   GFRAA >60 12/14/2010 1851   No results found for: CHOL, HDL, LDLCALC, LDLDIRECT, TRIG, CHOLHDL Lab Results  Component  Value Date   HGBA1C 5.4 09/10/2018   Lab Results  Component Value Date   VITAMINB12 >2000 (H) 09/10/2018   Lab Results  Component Value Date   TSH 1.27 09/10/2015       ASSESSMENT AND PLAN 53 y.o. year old female  has a past medical history of Arthritis, Atrophic vaginitis, Atypical chest pain, Baker's cyst (03/28/12), Chest pain, CIN I (cervical intraepithelial neoplasia I) (1990), Degenerative disc disease, cervical (2020), Fibroid, Fibromyalgia, Hypertension, Lupus nephritis ([place]), Migraines, Neuropathy, Osteopenia (09/2017), Premature menopause (age 19), and Shingles.  here with     ICD-10-CM   1. Migraine without aura and without status migrainosus, not intractable  G43.009   2. Small fiber neuropathy  G62.9   3. Other chronic pain  G89.29   4. Benign essential hypertension  I10   5. Generalized anxiety disorder  F41.1     Unfortunately, Nicole Kindred has experienced an increase in frequency of her migraine headaches.  She feels that this is directly correlated with increased stress due to her dad's recent cancer diagnosis.  She is the main source of support for her 12 siblings.  She is separated husband.  She is caring for her daughters at home.  She is working closely with Jobie Quaker for management of anxiety and depression.  I have advised that she continue to work very closely as Trintellix has not been as effective for her.  She does have follow-up this week and will discuss this with Ms. Ysidro Evert.  She will also follow-up closely with her primary care provider as her blood pressures are elevated.  She was recently started on hydralazine and feels that this will help.  We have discussed adding additional medications.  I am concerned about adding propranolol due to her current pulse of 54.  I am concerned that topiramate may worsen depression.  We will steer clear of the amitriptyline or nortriptyline as she is currently on Trintellix.  She does feel that gabapentin has helped with neuropathic  pain as well as headaches.  Although she is on multiple pain medications, we have discussed this in detail.  We will increase gabapentin to 600 mg in the morning, 600 mg at lunch and 1200 mg in the evenings.  She was advised not to take these medications in close proximity to oxycodone.  She will report any concerns of increased sedation or other new/concerning symptoms as directed.  Pulmonary therapy is advised for stress management.  She will follow-up with me in 3 months, sooner if needed.  She verbalizes understanding and agreement with this plan.   No orders of the defined types were placed in this encounter.    Meds ordered this encounter  Medications  . gabapentin (NEURONTIN) 600 MG tablet    Sig: Take 600mg  in am, 600mg  at lunch and 1200mg  at bedtime    Dispense:  270 tablet    Refill:  1    Order Specific Question:   Supervising Provider    Answer:   JAMAYIA, MCGLOCKLIN [id number]      I spent 15 minutes with the patient. 50% of this time was spent counseling and educating patient on plan of care and medications.    Debbora Presto, FNP-C 03/11/2019, 4:18 PM [facility] [address], [place], [place] [address] [phone]  Made any corrections needed, and agree with history, physical, neuro exam,assessment and plan as stated.     Sarina Ill, MD [facility]  Made any corrections needed, and agree with history, physical, neuro exam,assessment and plan as stated.     Sarina Ill, MD [facility]

## 2019-03-19 ENCOUNTER — Ambulatory Visit: Payer: 59 | Admitting: Family Medicine

## 2019-03-31 DIAGNOSIS — F331 Major depressive disorder, recurrent, moderate: Secondary | ICD-10-CM | POA: Diagnosis not present

## 2019-04-28 ENCOUNTER — Telehealth: Payer: Self-pay

## 2019-04-28 NOTE — Telephone Encounter (Signed)
Started PA for Ajovy 225mg /1.5 ML on CMM [id number] - PA Case ID: [id number] - Rx #[id number]  Awaiting response.

## 2019-04-29 DIAGNOSIS — M810 Age-related osteoporosis without current pathological fracture: Secondary | ICD-10-CM | POA: Diagnosis not present

## 2019-04-29 DIAGNOSIS — M797 Fibromyalgia: Secondary | ICD-10-CM | POA: Diagnosis not present

## 2019-04-29 DIAGNOSIS — M329 Systemic lupus erythematosus, unspecified: Secondary | ICD-10-CM | POA: Diagnosis not present

## 2019-04-29 DIAGNOSIS — M79672 Pain in left foot: Secondary | ICD-10-CM | POA: Diagnosis not present

## 2019-04-29 DIAGNOSIS — M79671 Pain in right foot: Secondary | ICD-10-CM | POA: Diagnosis not present

## 2019-04-29 DIAGNOSIS — M1990 Unspecified osteoarthritis, unspecified site: Secondary | ICD-10-CM | POA: Diagnosis not present

## 2019-04-29 DIAGNOSIS — M542 Cervicalgia: Secondary | ICD-10-CM | POA: Diagnosis not present

## 2019-04-29 DIAGNOSIS — Z79899 Other long term (current) drug therapy: Secondary | ICD-10-CM | POA: Diagnosis not present

## 2019-04-29 NOTE — Telephone Encounter (Addendum)
Received denial on the ajovy. Not a covered benefit.  Please advise.  [place] [id number].

## 2019-04-29 NOTE — Telephone Encounter (Addendum)
I spoke to pt and she will log into mychart and look at the directions and if any questions will call us back. Pt will let me know what she decides after speaking to her insurance about approved drug.

## 2019-04-29 NOTE — Telephone Encounter (Signed)
We can try Amovig or Emgality based on insurance approval. She can try copay card as well if appropriate.

## 2019-04-29 NOTE — Telephone Encounter (Signed)
I sent a mychart message to see what she can find out about her approved drugs on her insurance.

## 2019-04-30 NOTE — Telephone Encounter (Signed)
I called pt and she had not gotten message on mychart about coverage of other CRGP options,  I relayed that received denial on ajovy.  She may try to use savings card for ajovy or if this does not pan out will call insurance about what alternative they will cover.  She will get back to me on mychart.

## 2019-05-07 ENCOUNTER — Ambulatory Visit (INDEPENDENT_AMBULATORY_CARE_PROVIDER_SITE_OTHER): Payer: 59

## 2019-05-07 ENCOUNTER — Ambulatory Visit (INDEPENDENT_AMBULATORY_CARE_PROVIDER_SITE_OTHER): Payer: 59 | Admitting: Podiatry

## 2019-05-07 ENCOUNTER — Other Ambulatory Visit: Payer: Self-pay

## 2019-05-07 DIAGNOSIS — S92502A Displaced unspecified fracture of left lesser toe(s), initial encounter for closed fracture: Secondary | ICD-10-CM | POA: Diagnosis not present

## 2019-05-07 DIAGNOSIS — M79672 Pain in left foot: Secondary | ICD-10-CM

## 2019-05-07 DIAGNOSIS — M778 Other enthesopathies, not elsewhere classified: Secondary | ICD-10-CM

## 2019-05-08 ENCOUNTER — Other Ambulatory Visit: Payer: Self-pay | Admitting: Podiatry

## 2019-05-08 ENCOUNTER — Encounter: Payer: Self-pay | Admitting: Podiatry

## 2019-05-08 DIAGNOSIS — M778 Other enthesopathies, not elsewhere classified: Secondary | ICD-10-CM

## 2019-05-08 NOTE — Progress Notes (Signed)
Subjective:  Patient ID: Marie Peterson, female    DOB: 1965-10-22,  MRN: [id number]  Chief Complaint  Patient presents with  . Foot Pain    pt is here for left foot pain, pt states that the pain has been going on for about 2 weeks, pt states that it is painful to the touch. pt also states that she has a history of a fracture as well.    54 y.o. female presents with the above complaint.  Patient presents with a follow-up of left second toe pain.  Patient states that she hit/stop the wall hard and has been hurting her since last 2 weeks.  She she states that it is painful to touch.  Patient has tried icing it does not help.  Patient denies any other acute complaints.  She was to get it evaluated to make sure it is not broken.  She would like to know if there is any other treatment options for this.   Review of Systems: Negative except as noted in the HPI. Denies N/V/F/Ch.  Past Medical History:  Diagnosis Date  . Arthritis   . Atrophic vaginitis   . Atypical chest pain    thought to be GERD; myoview 05/17/07-no significant ischemia; echo 07/20/11- normal EF=>55%  . Baker's cyst 03/28/12   venous doppler 03/28/12=no thrombus; baker's cyst at left popliteal fossa  . Chest pain   . CIN I (cervical intraepithelial neoplasia I) 1990  . Degenerative disc disease, cervical 2020   severe, C3-4, 4-5, 5-6. Currently receiving injections by Dr. Vira Blanco of preferred pain management  . Fibroid   . Fibromyalgia   . Hypertension   . Lupus nephritis ([place])   . Migraines   . Neuropathy   . Osteopenia 09/2017   T score -2.0 improved from prior study  . Premature menopause age 27   following chemotherapy for Lupus  . Shingles     Current Outpatient Medications:  .  AJOVY 225 MG/1.5ML SOSY, INJECT 675 MILLIGRAM SUBCUTANEOUSLY EVERY 3 MONTHS, Disp: 4.5 mL, Rfl: 11 .  alendronate (FOSAMAX) 70 MG tablet, TAKE 1 TABLET BY MOUTH WITH A FULL GLASS OF WATER ON AN EMPTY STOMACH EVERY WEEK, Disp: 4 tablet, Rfl:  12 .  amLODipine (NORVASC) 2.5 MG tablet, Take 2.5 mg by mouth daily., Disp: , Rfl:  .  amLODipine (NORVASC) 5 MG tablet, Take 5 mg by mouth daily. , Disp: , Rfl:  .  aspirin 81 MG tablet, Take 81 mg by mouth daily., Disp: , Rfl:  .  BELSOMRA 10 MG TABS, 20 mg. , Disp: , Rfl: 0 .  benazepril (LOTENSIN) 40 MG tablet, Take 1 tablet by mouth daily., Disp: , Rfl: 1 .  benzonatate (TESSALON) 100 MG capsule, Take 100 mg by mouth 3 (three) times daily., Disp: , Rfl:  .  Cholecalciferol (VITAMIN D PO), Take 2,000 Int'l Units by mouth daily. , Disp: , Rfl:  .  diclofenac sodium (VOLTAREN) 1 % GEL, Apply topically., Disp: , Rfl:  .  FERREX 150 150 MG capsule, Take 1 tablet by mouth daily., Disp: , Rfl: 1 .  folic acid (FOLVITE) A999333 MCG tablet, Take 400 mcg by mouth daily., Disp: , Rfl:  .  furosemide (LASIX) 40 MG tablet, Take 1 tablet by mouth 2 (two) times daily. TAKE 1 AND 1/2 TABLET IN THE MORNING AND 1 TABLET AT NIGHT., Disp: , Rfl: 0 .  gabapentin (NEURONTIN) 600 MG tablet, Take 600mg  in am, 600mg  at lunch and 1200mg  at bedtime,  Disp: 270 tablet, Rfl: 1 .  hydrALAZINE (APRESOLINE) 10 MG tablet, Take 10 mg by mouth 2 (two) times daily., Disp: , Rfl:  .  hydroxychloroquine (PLAQUENIL) 200 MG tablet, Take 200 mg by mouth 2 (two) times daily. , Disp: , Rfl: 0 .  L-Methylfolate-B6-B12 (METANX PO), Take 1 tablet by mouth 2 (two) times daily. , Disp: , Rfl:  .  lidocaine (LIDODERM) 5 %, Place 3 patches onto the skin daily. Remove & Discard patch within 12 hours or as directed by MD (Patient taking differently: Place 2 patches onto the skin daily. Remove & Discard patch within 12 hours or as directed by MD For use as needed), Disp: 90 patch, Rfl: 6 .  magnesium oxide (MAG-OX) 400 (241.3 Mg) MG tablet, Take 1 tablet (400 mg total) by mouth daily. Please schedule appointment for refills, Disp: 90 tablet, Rfl: 3 .  Multiple Vitamin (MULTIVITAMIN) tablet, Take 1 tablet by mouth daily., Disp: , Rfl:  .  nabumetone  (RELAFEN) 500 MG tablet, Take 500 mg by mouth 2 (two) times daily as needed., Disp: , Rfl: 0 .  neomycin-polymyxin b-dexamethasone (MAXITROL) 3.5-10000-0.1 OINT, Place 1 application into the right eye at bedtime., Disp: , Rfl:  .  Omega-3 Fatty Acids (FISH OIL) 1000 MG CAPS, Take 2 capsules by mouth daily., Disp: , Rfl:  .  omeprazole (PRILOSEC) 40 MG capsule, Take 40 mg by mouth daily., Disp: , Rfl: 1 .  ondansetron (ZOFRAN-ODT) 4 MG disintegrating tablet, DISSOLVE 1 TABLET ON TONGUE EVERY 8 HOURS IF NEEDED FOR NAUSEA AND VOMITING, Disp: 20 tablet, Rfl: 11 .  oxyCODONE-acetaminophen (PERCOCET) 7.5-325 MG tablet, Take 1 tablet by mouth every 8 (eight) hours., Disp: , Rfl:  .  phentermine (ADIPEX-P) 37.5 MG tablet, TK 1 T PO QD, Disp: , Rfl:  .  potassium chloride SA (K-DUR,KLOR-CON) 20 MEQ tablet, Take 1 tablet (20 mEq total) by mouth daily. NEED OV., Disp: 90 tablet, Rfl: 3 .  predniSONE (DELTASONE) 5 MG tablet, Take 5 mg by mouth daily., Disp: , Rfl:  .  Rimegepant Sulfate (NURTEC) 75 MG TBDP, Take 75 mg by mouth daily as needed (take for abortive therapy of migraine, no more than 1 tablet in 24 hours or 10 per month)., Disp: 10 tablet, Rfl: 11 .  tolterodine (DETROL LA) 4 MG 24 hr capsule, Take 4 mg by mouth daily., Disp: , Rfl:  .  venlafaxine XR (EFFEXOR-XR) 37.5 MG 24 hr capsule, Take 37.5 mg by mouth daily., Disp: , Rfl:  .  vortioxetine HBr (TRINTELLIX) 10 MG TABS tablet, Take 10 mg by mouth daily., Disp: , Rfl:  .  XIIDRA 5 % SOLN, Place 1 drop into both eyes 2 (two) times daily., Disp: , Rfl:  .  phentermine 37.5 MG capsule, Take 37.5 mg by mouth every morning., Disp: , Rfl:   Social History   Tobacco Use  Smoking Status Never Smoker  Smokeless Tobacco Never Used    Allergies  Allergen Reactions  . Cymbalta [Duloxetine Hcl]     Lip swelling  . Myrbetriq [Mirabegron] Itching   Objective:  There were no vitals filed for this visit. There is no height or weight on file to  calculate BMI. Constitutional Well developed. Well nourished.  Vascular Dorsalis pedis pulses palpable bilaterally. Posterior tibial pulses palpable bilaterally. Capillary refill normal to all digits.  No cyanosis or clubbing noted. Pedal hair growth normal.  Neurologic Normal speech. Oriented to person, place, and time. Epicritic sensation to light touch grossly present  bilaterally.  Dermatologic Nails well groomed and normal in appearance. No open wounds. No skin lesions.  Orthopedic:  Left second toe pain on palpation.  Pain with range of motion of the second toe especially at the interphalangeal joint joint.  No pain at the metatarsophalangeal joint.  Pain with resisted and active range of motion with the extensors and flexors.   Radiographs: 2 views of skeletally mature adult foot.  There is a nondisplaced proximal phalanx head fracture in good alignment.  No other fractures noted.  No other bony abnormalities identified. Assessment:   1. Foot pain, left   2. Closed fracture of phalanx of left second toe, initial encounter    Plan:  Patient was evaluated and treated and all questions answered.  Left second digit proximal phalanx fracture nondisplaced -I explained to the patient the etiology of fractures and various treatment options associated with it.  I believe patient will benefit from buddy splinting with hallux to provide [place] stable scaffold to allow for proper healing.  I instructed her and educated in proper body splinting.  She states that she will continue to buddy splint her toe to the big toe. -I also believe patient will benefit from a surgical shoe to prevent bending/torsional forces. -Surgical shoe was dispensed. -I will see her evaluated back in 4 weeks to assess her healing.  Return in about 4 weeks (around 06/04/2019) for Fracture F/U - get xray.

## 2019-05-10 ENCOUNTER — Other Ambulatory Visit: Payer: Self-pay | Admitting: Cardiovascular Disease

## 2019-05-20 ENCOUNTER — Other Ambulatory Visit: Payer: Self-pay | Admitting: Neurology

## 2019-05-21 ENCOUNTER — Ambulatory Visit (INDEPENDENT_AMBULATORY_CARE_PROVIDER_SITE_OTHER): Payer: Self-pay | Admitting: *Deleted

## 2019-05-21 ENCOUNTER — Other Ambulatory Visit: Payer: Self-pay

## 2019-05-21 DIAGNOSIS — G43009 Migraine without aura, not intractable, without status migrainosus: Secondary | ICD-10-CM

## 2019-05-21 DIAGNOSIS — Z741 Need for assistance with personal care: Secondary | ICD-10-CM

## 2019-05-21 DIAGNOSIS — Z0289 Encounter for other administrative examinations: Secondary | ICD-10-CM

## 2019-05-21 NOTE — Progress Notes (Signed)
Patient was given her 3 scheduled injections of Ajovy 225 mg each. Total 675 mg. She brought these from home. They were administered in LLQ of abdomen. Pt tolerated wellandaseptic technique used. Next injections scheduled for 08/18/19 @ 8:15 AM.  LOT: [id number], [id number]  & [id number], [id number]

## 2019-06-02 DIAGNOSIS — F331 Major depressive disorder, recurrent, moderate: Secondary | ICD-10-CM | POA: Diagnosis not present

## 2019-06-02 DIAGNOSIS — F419 Anxiety disorder, unspecified: Secondary | ICD-10-CM | POA: Diagnosis not present

## 2019-06-06 ENCOUNTER — Other Ambulatory Visit: Payer: Self-pay

## 2019-06-06 ENCOUNTER — Encounter: Payer: Self-pay | Admitting: Podiatry

## 2019-06-06 ENCOUNTER — Ambulatory Visit (INDEPENDENT_AMBULATORY_CARE_PROVIDER_SITE_OTHER): Payer: 59

## 2019-06-06 ENCOUNTER — Ambulatory Visit (INDEPENDENT_AMBULATORY_CARE_PROVIDER_SITE_OTHER): Payer: 59 | Admitting: Podiatry

## 2019-06-06 DIAGNOSIS — M722 Plantar fascial fibromatosis: Secondary | ICD-10-CM

## 2019-06-06 DIAGNOSIS — B353 Tinea pedis: Secondary | ICD-10-CM

## 2019-06-06 DIAGNOSIS — S92502A Displaced unspecified fracture of left lesser toe(s), initial encounter for closed fracture: Secondary | ICD-10-CM | POA: Diagnosis not present

## 2019-06-06 MED ORDER — CLOTRIMAZOLE-BETAMETHASONE 1-0.05 % EX CREA: 1.0000 "application " | TOPICAL_CREAM | Freq: Two times a day (BID) | CUTANEOUS | 0 refills | Status: DC

## 2019-06-06 NOTE — Progress Notes (Signed)
Subjective:  Patient ID: Marie Peterson, female    DOB: [birth year],  MRN: [id number]  Chief Complaint  Patient presents with  . Foot Injury    pt is here for a fracture f/u of the left foot, pt is doing better but states that her pain is a 5 out of 10 on the pain scale,    54 y.o. female presents with the above complaint.  She states that she is doing better overall from the foot fracture.  But she still has pain associated with it.  Patient splits her pain scale 5 out of 10.  The surgical shoe has been helping a lot.  Overall patient pain is decreasing to the fracture site.  She has a secondary complaint of left plantar fasciitis.  Has progressively gotten worse.  Is likely due to the surgical shoe.  She states the pain is 5 out of 10.  She has not tried anything she has not done any stretching exercises.  She also has a tertiary complaint of left athlete's foot.  She states that there is itching associated with it.  There is scaliness associated.  She has not tried any kind of medication or cream for it.  She would like to know what other treatment options for plantar fasciitis as well as left foot athlete's foot.   Review of Systems: Negative except as noted in the HPI. Denies N/V/F/Ch.  Past Medical History:  Diagnosis Date  . Arthritis   . Atrophic vaginitis   . Atypical chest pain    thought to be GERD; myoview 05/17/07-no significant ischemia; echo 07/20/11- normal EF=>55%  . Baker's cyst 03/28/12   venous doppler 03/28/12=no thrombus; baker's cyst at left popliteal fossa  . Chest pain   . CIN I (cervical intraepithelial neoplasia I) 1990  . Degenerative disc disease, cervical 2020   severe, C3-4, 4-5, 5-6. Currently receiving injections by Dr. Vira Blanco of preferred pain management  . Fibroid   . Fibromyalgia   . Hypertension   . Lupus nephritis ([place])   . Migraines   . Neuropathy   . Osteopenia 09/2017   T score -2.0 improved from prior study  . Premature menopause age 19   following  chemotherapy for Lupus  . Shingles     Current Outpatient Medications:  .  AJOVY 225 MG/1.5ML SOSY, INJECT 675 MILLIGRAM SUBCUTANEOUSLY EVERY 3 MONTHS, Disp: 4.5 mL, Rfl: 11 .  alendronate (FOSAMAX) 70 MG tablet, TAKE 1 TABLET BY MOUTH WITH A FULL GLASS OF WATER ON AN EMPTY STOMACH EVERY WEEK, Disp: 4 tablet, Rfl: 12 .  amLODipine (NORVASC) 2.5 MG tablet, Take 2.5 mg by mouth daily., Disp: , Rfl:  .  amLODipine (NORVASC) 5 MG tablet, Take 5 mg by mouth daily. , Disp: , Rfl:  .  aspirin 81 MG tablet, Take 81 mg by mouth daily., Disp: , Rfl:  .  BELSOMRA 10 MG TABS, 20 mg. , Disp: , Rfl: 0 .  benazepril (LOTENSIN) 40 MG tablet, Take 1 tablet by mouth daily., Disp: , Rfl: 1 .  benzonatate (TESSALON) 100 MG capsule, Take 100 mg by mouth 3 (three) times daily., Disp: , Rfl:  .  Cholecalciferol (VITAMIN D PO), Take 2,000 Int'l Units by mouth daily. , Disp: , Rfl:  .  diclofenac sodium (VOLTAREN) 1 % GEL, Apply topically., Disp: , Rfl:  .  FERREX 150 150 MG capsule, Take 1 tablet by mouth daily., Disp: , Rfl: 1 .  folic acid (FOLVITE) A999333 MCG tablet, Take  400 mcg by mouth daily., Disp: , Rfl:  .  furosemide (LASIX) 40 MG tablet, Take 1 tablet by mouth 2 (two) times daily. TAKE 1 AND 1/2 TABLET IN THE MORNING AND 1 TABLET AT NIGHT., Disp: , Rfl: 0 .  gabapentin (NEURONTIN) 600 MG tablet, Take 600mg  in am, 600mg  at lunch and 1200mg  at bedtime, Disp: 270 tablet, Rfl: 1 .  hydrALAZINE (APRESOLINE) 10 MG tablet, Take 10 mg by mouth 2 (two) times daily., Disp: , Rfl:  .  hydroxychloroquine (PLAQUENIL) 200 MG tablet, Take 200 mg by mouth 2 (two) times daily. , Disp: , Rfl: 0 .  L-Methylfolate-B6-B12 (METANX PO), Take 1 tablet by mouth 2 (two) times daily. , Disp: , Rfl:  .  lidocaine (LIDODERM) 5 %, Place 3 patches onto the skin daily. Remove & Discard patch within 12 hours or as directed by MD (Patient taking differently: Place 2 patches onto the skin daily. Remove & Discard patch within 12 hours or as  directed by MD For use as needed), Disp: 90 patch, Rfl: 6 .  LORazepam (ATIVAN) 0.5 MG tablet, Take 0.5 mg by mouth daily as needed., Disp: , Rfl:  .  MAGNESIUM-OXIDE 400 (241.3 Mg) MG tablet, TAKE 1 TABLET BY MOUTH EVERY DAY**PLEASE SCHEDULE APPOINTMENT FOR REFILLS**, Disp: 90 tablet, Rfl: 0 .  Multiple Vitamin (MULTIVITAMIN) tablet, Take 1 tablet by mouth daily., Disp: , Rfl:  .  nabumetone (RELAFEN) 500 MG tablet, Take 500 mg by mouth 2 (two) times daily as needed., Disp: , Rfl: 0 .  neomycin-polymyxin b-dexamethasone (MAXITROL) 3.5-10000-0.1 OINT, Place 1 application into the right eye at bedtime., Disp: , Rfl:  .  Omega-3 Fatty Acids (FISH OIL) 1000 MG CAPS, Take 2 capsules by mouth daily., Disp: , Rfl:  .  omeprazole (PRILOSEC) 40 MG capsule, Take 40 mg by mouth daily., Disp: , Rfl: 1 .  ondansetron (ZOFRAN-ODT) 4 MG disintegrating tablet, DISSOLVE 1 TABLET ON TONGUE EVERY 8 HOURS IF NEEDED FOR NAUSEA AND VOMITING, Disp: 20 tablet, Rfl: 11 .  oxyCODONE-acetaminophen (PERCOCET) 7.5-325 MG tablet, Take 1 tablet by mouth every 8 (eight) hours., Disp: , Rfl:  .  phentermine (ADIPEX-P) 37.5 MG tablet, TK 1 T PO QD, Disp: , Rfl:  .  phentermine 37.5 MG capsule, Take 37.5 mg by mouth every morning., Disp: , Rfl:  .  potassium chloride SA (K-DUR,KLOR-CON) 20 MEQ tablet, Take 1 tablet (20 mEq total) by mouth daily. NEED OV., Disp: 90 tablet, Rfl: 3 .  predniSONE (DELTASONE) 5 MG tablet, Take 5 mg by mouth daily., Disp: , Rfl:  .  Rimegepant Sulfate (NURTEC) 75 MG TBDP, Take 75 mg by mouth daily as needed (take for abortive therapy of migraine, no more than 1 tablet in 24 hours or 10 per month)., Disp: 10 tablet, Rfl: 11 .  tolterodine (DETROL LA) 4 MG 24 hr capsule, Take 4 mg by mouth daily., Disp: , Rfl:  .  venlafaxine XR (EFFEXOR-XR) 37.5 MG 24 hr capsule, Take 37.5 mg by mouth daily., Disp: , Rfl:  .  vortioxetine HBr (TRINTELLIX) 10 MG TABS tablet, Take 10 mg by mouth daily., Disp: , Rfl:  .   XIIDRA 5 % SOLN, Place 1 drop into both eyes 2 (two) times daily., Disp: , Rfl:  .  clotrimazole-betamethasone (LOTRISONE) cream, Apply 1 application topically 2 (two) times daily., Disp: 30 g, Rfl: 0  Social History   Tobacco Use  Smoking Status Never Smoker  Smokeless Tobacco Never Used    Allergies  Allergen Reactions  . Cymbalta [Duloxetine Hcl]     Lip swelling  . Myrbetriq [Mirabegron] Itching   Objective:  There were no vitals filed for this visit. There is no height or weight on file to calculate BMI. Constitutional Well developed. Well nourished.  Vascular Dorsalis pedis pulses palpable bilaterally. Posterior tibial pulses palpable bilaterally. Capillary refill normal to all digits.  No cyanosis or clubbing noted. Pedal hair growth normal.  Neurologic Normal speech. Oriented to person, place, and time. Epicritic sensation to light touch grossly present bilaterally.  Dermatologic Nails well groomed and normal in appearance. No open wounds. Epidermal lysis of the left plantar foot with associated subjective itching noted.  Orthopedic: Normal joint ROM without pain or crepitus bilaterally. No visible deformities. Tender to palpation at the calcaneal tuber left. No pain with calcaneal squeeze left. Ankle ROM full range of motion left. Silfverskiold Test: negative left.  Mild pain on palpation to the left dorsal aspect of the second digit at the PIPJ joint.  Mild pain with range of motion at the MPJ and PIPJ joint of the second toe.   Radiographs: 3 views of skeletally mature adult foot: Fracture line at the head of the proximal phalanx noted.  No callus formation noted.  Appears to be nondisplaced.  No posterior plantar heel spurring noted.  No other bony abnormalities identified.  Assessment:   1. Closed fracture of phalanx of left second toe, initial encounter    Plan:  Patient was evaluated and treated and all questions answered.  Plantar Fasciitis, left -  XR reviewed as above.  - Educated on icing and stretching. Instructions given.  - Injection delivered to the plantar fascia as below. - DME: Plantar Fascial Brace - Pharmacologic management: Meloxicam/Medrol Dose Pak. Educated on risks/benefits and proper taking of medication.  Procedure: Injection Tendon/Ligament Location: Left plantar fascia at the glabrous junction; medial approach. Skin Prep: alcohol Injectate: 0.5 cc 0.5% marcaine plain, 0.5 cc of 1% Lidocaine, 0.5 cc kenalog 10. Disposition: Patient tolerated procedure well. Injection site dressed with a band-aid.  Left second digit proximal phalanx fracture nondisplaced -I explained to the patient the etiology of fractures and various treatment options associated with it.  I believe patient will benefit from buddy splinting with hallux to provide [place] stable scaffold to allow for proper healing.  I instructed her and educated in proper body splinting.  She states that she will continue to buddy splint her toe to the big toe. -Clinically patient is improving therefore I will treat her accordingly.  Radiographically there is no callus formation or any signs of healing however clinically her pain has gone down considerably.  Patient will continue her surgical shoe for now.  Athlete's foot left -I discussed with the patient the etiology of athlete's foot and various treatment options associated with it.  Given that there is itching associated with it I believe this is likely fungal in nature and therefore patient will benefit from clotrimazole betamethasone cream.  Patient will apply daily to the affected area.  Patient states understanding and will do so  No follow-ups on file.   No follow-ups on file.

## 2019-06-11 ENCOUNTER — Ambulatory Visit: Payer: 59 | Admitting: Family Medicine

## 2019-06-16 ENCOUNTER — Telehealth: Payer: Self-pay

## 2019-06-16 ENCOUNTER — Encounter: Payer: Self-pay | Admitting: Family Medicine

## 2019-06-16 NOTE — Telephone Encounter (Signed)
Stated a PA on CMM for pts Nurtec awaiting determination.   Key: [id number]

## 2019-06-17 ENCOUNTER — Telehealth: Payer: Self-pay

## 2019-06-17 NOTE — Telephone Encounter (Signed)
Received fax from [employer]...  Decision Notes: "the requested medication and/or diagnosis are not a covered benefit and are excluded from coverage in accordance with the terms and conditions of your plan benefit, this request had been administratively denied."  "If the treating physician would like to discuss this coverage decision with the physician or healthcare professional reviewer, please call [employer] prior authorization department at [phone]  With route to provider to see if there is a alternative medication.   I also checked GoodRx, the cheapest I found were [facility] and [facility]. [facility]:  $886.62 [employer]: [id number]

## 2019-06-17 NOTE — Telephone Encounter (Signed)
Please let patient know that insurance is declining coverage. They have covered it in the past, did she change plans?

## 2019-06-17 NOTE — Telephone Encounter (Signed)
Reached out to patient via phone call. (please see previous message(s)) Relayed over that the the medication was excluded from plan. Cannot get around excluded.  I also informed her of the prices using the GoodRx card.   the cheapest I found were [facility] and [facility]. [facility]:  [id number] [employer]: [id number]  I also informed her that the provider is OOO and I reached out to see what her plan of action is.   Awaiting to hear back from Amy.

## 2019-06-19 MED ORDER — UBRELVY 50 MG PO TABS: 50.0000 mg | ORAL_TABLET | Freq: Every day | ORAL | 11 refills | Status: DC | PRN

## 2019-06-19 NOTE — Telephone Encounter (Signed)
Yes. She should be able to access online

## 2019-06-19 NOTE — Addendum Note (Signed)
Addended by: Debbora Presto L on: 06/19/2019 04:37 PM   Modules accepted: Orders

## 2019-06-19 NOTE — Telephone Encounter (Signed)
I will reach back out to the patient in regards to insurance. Marie Peterson also informed me that Marie Peterson has a copay card and/or the pt can go to [email or web address] to get it at a discounted price.  Is that something you would suggest the pt do?  Please advise.

## 2019-06-19 NOTE — Telephone Encounter (Signed)
Called and spoke with the pt. She said that he insurance has not changed. However, she is okay with the Ubrelvy. And I informed her about the Website and Copay card.

## 2019-06-20 ENCOUNTER — Telehealth: Payer: Self-pay | Admitting: Cardiovascular Disease

## 2019-06-20 NOTE — Telephone Encounter (Signed)
LMOM RE: F/U Visit--- AF 

## 2019-06-24 ENCOUNTER — Encounter (HOSPITAL_COMMUNITY): Payer: Self-pay | Admitting: Emergency Medicine

## 2019-06-24 ENCOUNTER — Other Ambulatory Visit: Payer: Self-pay

## 2019-06-24 ENCOUNTER — Other Ambulatory Visit: Payer: Self-pay | Admitting: Cardiovascular Disease

## 2019-06-24 ENCOUNTER — Ambulatory Visit (INDEPENDENT_AMBULATORY_CARE_PROVIDER_SITE_OTHER): Payer: 59

## 2019-06-24 ENCOUNTER — Ambulatory Visit (HOSPITAL_COMMUNITY)
Admission: EM | Admit: 2019-06-24 | Discharge: 2019-06-24 | Disposition: A | Discharge status: Home / Self Care | Payer: 59 | Attending: Physician Assistant | Admitting: Physician Assistant

## 2019-06-24 DIAGNOSIS — S8002XA Contusion of left knee, initial encounter: Secondary | ICD-10-CM | POA: Diagnosis not present

## 2019-06-24 DIAGNOSIS — W19XXXA Unspecified fall, initial encounter: Secondary | ICD-10-CM

## 2019-06-24 DIAGNOSIS — M25562 Pain in left knee: Secondary | ICD-10-CM

## 2019-06-24 DIAGNOSIS — S8992XA Unspecified injury of left lower leg, initial encounter: Secondary | ICD-10-CM | POA: Diagnosis not present

## 2019-06-24 DIAGNOSIS — S62232A Other displaced fracture of base of first metacarpal bone, left hand, initial encounter for closed fracture: Secondary | ICD-10-CM | POA: Diagnosis not present

## 2019-06-24 DIAGNOSIS — S62202A Unspecified fracture of first metacarpal bone, left hand, initial encounter for closed fracture: Secondary | ICD-10-CM | POA: Diagnosis not present

## 2019-06-24 MED ORDER — ACETAMINOPHEN 325 MG PO TABS
975.0000 mg | ORAL_TABLET | Freq: Once | ORAL | Status: AC
  Administered 2019-06-24: 975 mg via ORAL

## 2019-06-24 MED ORDER — ACETAMINOPHEN 325 MG PO TABS
ORAL_TABLET | ORAL | Status: AC
  Filled 2019-06-24: qty 3

## 2019-06-24 NOTE — ED Triage Notes (Signed)
Pt here with trip and fall today with left thumb and wrist pain; bilateral knee abrasions

## 2019-06-24 NOTE — Telephone Encounter (Signed)
Rep with [employer] called to verify if [place] appeal form for NURTEC was received  cb#[id number]  Ref#[id number]

## 2019-06-24 NOTE — ED Provider Notes (Signed)
Marie Peterson    CSN: [id number] Arrival date & time: 06/24/19  [id number]      History   Chief Complaint Chief Complaint  Patient presents with  . Fall  . Wrist Pain    HPI Marie Peterson is a 54 y.o. female.   Patient reports to urgent care for evaluation after a fall this morning.  She reports she was walking in a parking lot when she tripped and fell.  She reports injuring her left hand primarily.  Her pain is primarily over the proximal thumb.  There has been swelling.  It hurts to move.  She denies numbness or tingling.  She also reports when she fell onto her knees.  She reports her left knee struck first and then the right.  She reports more pain over the left knee as opposed to the right.  There is pain with full extension of the knee she reports.  There is a small abrasion she reports on the left knee.  It has been painful to walk.  However she has been able to bear weight.  She denies hitting her head.  She denies pain at the elbow or her forearm.  Of note patient is a lupus patient on long-term prednisone therapy and reports she had issues with osteoporosis.  She is also on Fosamax.     Past Medical History:  Diagnosis Date  . Arthritis   . Atrophic vaginitis   . Atypical chest pain    thought to be GERD; myoview 05/17/07-no significant ischemia; echo 07/20/11- normal EF=>55%  . Baker's cyst 03/28/12   venous doppler 03/28/12=no thrombus; baker's cyst at left popliteal fossa  . Chest pain   . CIN I (cervical intraepithelial neoplasia I) 1990  . Degenerative disc disease, cervical 2020   severe, C3-4, 4-5, 5-6. Currently receiving injections by Dr. Vira Blanco of preferred pain management  . Fibroid   . Fibromyalgia   . Hypertension   . Lupus nephritis ([place])   . Migraines   . Neuropathy   . Osteopenia 09/2017   T score -2.0 improved from prior study  . Premature menopause age 25   following chemotherapy for Lupus  . Shingles     Patient Active Problem List    Diagnosis Date Noted  . Bilateral carpal tunnel syndrome 09/10/2018  . Sinus bradycardia on ECG 12/19/2016  . History of systemic lupus erythematosus (SLE) ([place]) 12/19/2016  . Migraine headache 10/12/2015  . Shingles   . Atypical chest pain 10/21/2014  . Essential hypertension 03/07/2013  . Small fiber neuropathy 10/22/2012  . Thoracic or lumbosacral neuritis or radiculitis, unspecified 10/22/2012  . History of shingles 10/22/2012  . Hyperreflexia 10/22/2012  . Arthritis   . Atrophic vaginitis   . Migraines   . Lupus nephritis ([place])   . Fibroid     Past Surgical History:  Procedure Laterality Date  . cervical fossa injections  2020  . CESAREAN SECTION    . COLPOSCOPY    . HYSTEROSCOPY     Myomectomy  . MYOMECTOMY     Hysteroscopic myomectomy  . NASAL SINUS SURGERY  01/2016  . TUBAL LIGATION      OB History    Gravida  2   Para  2   Term  2   Preterm      AB      Living  2     SAB      TAB      Ectopic  Multiple      Live Births               Home Medications    Prior to Admission medications   Medication Sig Start Date End Date Taking? Authorizing Provider  AJOVY 225 MG/1.5ML SOSY INJECT 675 MILLIGRAM SUBCUTANEOUSLY EVERY 3 MONTHS 07/27/18   Melvenia Beam, MD  alendronate (FOSAMAX) 70 MG tablet TAKE 1 TABLET BY MOUTH WITH A FULL GLASS OF WATER ON AN EMPTY STOMACH EVERY WEEK 10/09/18   Fontaine, Belinda Block, MD  amLODipine (NORVASC) 2.5 MG tablet Take 2.5 mg by mouth daily. 04/22/19   [provider]  amLODipine (NORVASC) 5 MG tablet Take 5 mg by mouth daily.     [provider]  aspirin 81 MG tablet Take 81 mg by mouth daily.    [provider]  BELSOMRA 10 MG TABS 20 mg.  04/29/16   [provider]  benazepril (LOTENSIN) 40 MG tablet Take 1 tablet by mouth daily. 02/02/14   [provider]  benzonatate (TESSALON) 100 MG capsule Take 100 mg by mouth 3 (three) times daily. 05/04/19   [provider]  Cholecalciferol (VITAMIN D PO) Take 2,000 Int'l Units by mouth daily.     [provider]  clotrimazole-betamethasone (LOTRISONE) cream Apply 1 application topically 2 (two) times daily. 06/06/19   Felipa Furnace, DPM  diclofenac sodium (VOLTAREN) 1 % GEL Apply topically.    [provider]  FERREX 150 150 MG capsule Take 1 tablet by mouth daily. 01/30/14   [provider]  folic acid (FOLVITE) A999333 MCG tablet Take 400 mcg by mouth daily.    [provider]  furosemide (LASIX) 40 MG tablet Take 1 tablet by mouth 2 (two) times daily. TAKE 1 AND 1/2 TABLET IN THE MORNING AND 1 TABLET AT NIGHT. 02/16/14   [provider]  gabapentin (NEURONTIN) 600 MG tablet Take 600mg  in am, 600mg  at lunch and 1200mg  at bedtime 03/11/19   Lomax, Amy, NP  hydrALAZINE (APRESOLINE) 10 MG tablet Take 10 mg by mouth 2 (two) times daily.    [provider]  hydroxychloroquine (PLAQUENIL) 200 MG tablet Take 200 mg by mouth 2 (two) times daily.  01/28/14   [provider]  L-Methylfolate-B6-B12 (METANX PO) Take 1 tablet by mouth 2 (two) times daily.     [provider]  lidocaine (LIDODERM) 5 % Place 3 patches onto the skin daily. Remove & Discard patch within 12 hours or as directed by MD Patient taking differently: Place 2 patches onto the skin daily. Remove & Discard patch within 12 hours or as directed by MD For use as needed 05/02/14   Melvenia Beam, MD  LORazepam (ATIVAN) 0.5 MG tablet Take 0.5 mg by mouth daily as needed. 06/03/19   [provider]  MAGNESIUM-OXIDE 400 (241.3 Mg) MG tablet TAKE 1 TABLET BY MOUTH EVERY DAY**PLEASE SCHEDULE APPOINTMENT FOR REFILLS** 05/12/19   Lorretta Harp, MD  Multiple Vitamin (MULTIVITAMIN) tablet Take 1 tablet by mouth daily.    [provider]  nabumetone (RELAFEN) 500 MG tablet Take 500 mg by mouth 2 (two) times daily as needed. 12/12/13   [provider]  neomycin-polymyxin  b-dexamethasone (MAXITROL) 3.5-10000-0.1 OINT Place 1 application into the right eye at bedtime. 02/28/19   [provider]  Omega-3 Fatty Acids (FISH OIL) 1000 MG CAPS Take 2 capsules by mouth daily.    [provider]  omeprazole (PRILOSEC) 40 MG capsule  Take 40 mg by mouth daily. 05/04/16   [provider]  ondansetron (ZOFRAN-ODT) 4 MG disintegrating tablet DISSOLVE 1 TABLET ON TONGUE EVERY 8 HOURS IF NEEDED FOR NAUSEA AND VOMITING 07/14/18   Melvenia Beam, MD  oxyCODONE-acetaminophen (PERCOCET) 7.5-325 MG tablet Take 1 tablet by mouth every 8 (eight) hours.    [provider]  phentermine (ADIPEX-P) 37.5 MG tablet TK 1 T PO QD 10/01/18   [provider]  phentermine 37.5 MG capsule Take 37.5 mg by mouth every morning.    [provider]  potassium chloride SA (KLOR-CON) 20 MEQ tablet TAKE 1 TABLET(20 MEQ) BY MOUTH DAILY 06/24/19   Lorretta Harp, MD  predniSONE (DELTASONE) 5 MG tablet Take 5 mg by mouth daily.    [provider]  Rimegepant Sulfate (NURTEC) 75 MG TBDP Take 75 mg by mouth daily as needed (take for abortive therapy of migraine, no more than 1 tablet in 24 hours or 10 per month). 12/19/18   Lomax, Amy, NP  tolterodine (DETROL LA) 4 MG 24 hr capsule Take 4 mg by mouth daily. 06/02/18   [provider]  Ubrogepant (UBRELVY) 50 MG TABS Take 50 mg by mouth daily as needed. Take one tablet at onset of headache, may repeat 1 tablet in 2 hours, no more than 2 tablets in 24 hours 06/19/19   Lomax, Amy, NP  venlafaxine XR (EFFEXOR-XR) 37.5 MG 24 hr capsule Take 37.5 mg by mouth daily. 03/20/19   [provider]  vortioxetine HBr (TRINTELLIX) 10 MG TABS tablet Take 10 mg by mouth daily.    [provider]  XIIDRA 5 % SOLN Place 1 drop into both eyes 2 (two) times daily. 03/03/19   [provider]    Family History Family History  Problem Relation Age of Onset  . Hypertension Mother   . Asthma  Mother   . COPD Mother   . Diabetes Father   . Hypertension Father   . Heart disease Father     Social History Social History   Tobacco Use  . Smoking status: Never Smoker  . Smokeless tobacco: Never Used  Substance Use Topics  . Alcohol use: No    Alcohol/week: 0.0 standard drinks  . Drug use: No     Allergies   Cymbalta [duloxetine hcl] and Myrbetriq [mirabegron]   Review of Systems Review of Systems  Musculoskeletal:       See HPI  Neurological: Negative for dizziness and headaches.  Hematological: Negative for adenopathy. Does not bruise/bleed easily.  All other systems reviewed and are negative.    Physical Exam Triage Vital Signs ED Triage Vitals  Enc Vitals Group     BP 06/24/19 1007 131/75     Pulse Rate 06/24/19 1007 81     Resp 06/24/19 1007 16     Temp 06/24/19 1007 98.4 F (36.9 C)     Temp Source 06/24/19 1007 Oral     SpO2 06/24/19 1007 100 %     Weight --      Height --      Head Circumference --      Peak Flow --      Pain Score 06/24/19 1019 7     Pain Loc --      Pain Edu? --      Excl. in [place]? --    No data found.  Updated Vital Signs BP 131/75 (BP Location: Left Arm)   Pulse 81   Temp 98.4  F (36.9 C) (Oral)   Resp 16   SpO2 100%   Visual Acuity Right Eye Distance:   Left Eye Distance:   Bilateral Distance:    Right Eye Near:   Left Eye Near:    Bilateral Near:     Physical Exam Vitals and nursing note reviewed.  Constitutional:      General: She is not in acute distress.    Appearance: She is well-developed. She is not ill-appearing.  HENT:     Head: Normocephalic and atraumatic.  Eyes:     Conjunctiva/sclera: Conjunctivae normal.  Cardiovascular:     Rate and Rhythm: Normal rate.  Pulmonary:     Effort: Pulmonary effort is normal. No respiratory distress.  Musculoskeletal:     Cervical back: Neck supple.     Comments: Left first MCP joint with swelling and ecchymosis.  There is no obvious deformity other than  swelling ecchymosis.  Patient able to freely move the distal phalanx of the thumb.  Sensation cap refill normal.  There is ecchymosis and tenderness overlying the left patella.  There is superficial abrasion of the left patella.  It was advised to extension flexion left knee.  Knee stable with anterior and posterior drawer.  There is mild tenderness over the right patella with a small amount of medial ecchymosis.  Knee joint stable  Skin:    General: Skin is warm and dry.  Neurological:     Mental Status: She is alert.      UC Treatments / Results  Labs (all labs ordered are listed, but only abnormal results are displayed) Labs Reviewed - No data to display  EKG   Radiology DG Knee Complete 4 Views Left  Result Date: 06/24/2019 CLINICAL DATA:  Pain following fall EXAM: LEFT KNEE - COMPLETE 4+ VIEW COMPARISON:  March 05, 2007 FINDINGS: Frontal, lateral, and bilateral oblique views were obtained. No fracture or dislocation. No joint effusion. There is mild narrowing of the patellofemoral joint and medial compartment. There is spurring in all compartments. No erosive change. IMPRESSION: Osteoarthritic change with spurring in all compartments. Joint space narrowing medially and in the patellofemoral joint noted. No fracture, dislocation, or effusion. Electronically Signed   By: Lowella Grip III M.D.   On: 06/24/2019 11:05   DG Hand Complete Left  Result Date: 06/24/2019 CLINICAL DATA:  Pain following fall EXAM: LEFT HAND - COMPLETE 3+ VIEW COMPARISON:  January 27, 2019 FINDINGS: Frontal, oblique, and lateral views were obtained. There is a comminuted fracture of the proximal aspect of the first metacarpal with impaction at the fracture site. There are several displaced fracture fragments in this area. No other fractures are evident. No dislocation. Joint spaces appear normal. No erosive change. IMPRESSION: Comminuted fracture proximal aspect of first metacarpal with impaction at the  fracture site and several displaced fracture fragments. No other fracture. No dislocation. No appreciable arthropathy. These results will be called to the ordering clinician or representative by the Radiologist Assistant, and communication documented in the PACS or [employer]. Electronically Signed   By: Lowella Grip III M.D.   On: 06/24/2019 11:03    Procedures Procedures (including critical care time)  Medications Ordered in UC Medications  acetaminophen (TYLENOL) tablet 975 mg (975 mg Oral Given 06/24/19 1104)    Initial Impression / Assessment and Plan / UC Course  I have reviewed the triage vital signs and the nursing notes.  Pertinent labs & imaging results that were available during my care of the patient  were reviewed by me and considered in my medical decision making (see chart for details).     #Fall #Displaced fracture first metacarpal #Knee contusion Is a 54 year old female past medical history of lupus and osteoporosis likely secondary to prednisone use presenting for evaluation of injuries after a fall.  Radiographic evidence of fracture proximal first metacarpal.  X-ray left knee was negative for acute fracture.  Discussed management options with orthopedics no fracture.  It was determined to place in thumb spica and have patient follow-up with orthopedic surgeon this week.  Per chart review discussed with patient she is already on a Percocet 7.5 mg dose every 8 hours at home.  Discussed that she may had 2 regular strength Tylenol to this dosing regimen if she needs additional pain management. -Patient verbalized understanding plan will follow up with orthopedics. Final Clinical Impressions(s) / UC Diagnoses   Final diagnoses:  Fall, initial encounter  Other closed displaced fracture of base of first metacarpal bone of left hand, initial encounter  Contusion of left knee, initial encounter     Discharge Instructions     Wear the splint until you have been  evaluated by orthopedics.  You may take 2 regular strength Tylenol in addition to your oxycodone regimen at home.  These follow-up with the orthopedic office supplied the be seen.    ED Prescriptions    None     PDMP not reviewed this encounter.   Purnell Shoemaker, PA-C 06/24/19 2323

## 2019-06-24 NOTE — Discharge Instructions (Signed)
Wear the splint until you have been evaluated by orthopedics.  You may take 2 regular strength Tylenol in addition to your oxycodone regimen at home.  These follow-up with the orthopedic office supplied the be seen.

## 2019-06-25 NOTE — Telephone Encounter (Signed)
Per previous note, plan exclusion.  Appeal not done. Cancelled in CMM.

## 2019-06-27 DIAGNOSIS — S62222A Displaced Rolando's fracture, left hand, initial encounter for closed fracture: Secondary | ICD-10-CM | POA: Diagnosis not present

## 2019-06-27 DIAGNOSIS — M79645 Pain in left finger(s): Secondary | ICD-10-CM | POA: Diagnosis not present

## 2019-07-04 ENCOUNTER — Encounter: Payer: Self-pay | Admitting: Podiatry

## 2019-07-04 ENCOUNTER — Ambulatory Visit (INDEPENDENT_AMBULATORY_CARE_PROVIDER_SITE_OTHER): Payer: 59

## 2019-07-04 ENCOUNTER — Other Ambulatory Visit: Payer: Self-pay

## 2019-07-04 ENCOUNTER — Ambulatory Visit (INDEPENDENT_AMBULATORY_CARE_PROVIDER_SITE_OTHER): Payer: 59 | Admitting: Podiatry

## 2019-07-04 VITALS — Temp 97.2°F | Resp 14

## 2019-07-04 DIAGNOSIS — M79672 Pain in left foot: Secondary | ICD-10-CM

## 2019-07-04 DIAGNOSIS — M2141 Flat foot [pes planus] (acquired), right foot: Secondary | ICD-10-CM | POA: Diagnosis not present

## 2019-07-04 DIAGNOSIS — M2142 Flat foot [pes planus] (acquired), left foot: Secondary | ICD-10-CM | POA: Diagnosis not present

## 2019-07-04 DIAGNOSIS — M21961 Unspecified acquired deformity of right lower leg: Secondary | ICD-10-CM

## 2019-07-04 DIAGNOSIS — M21962 Unspecified acquired deformity of left lower leg: Secondary | ICD-10-CM

## 2019-07-04 DIAGNOSIS — M722 Plantar fascial fibromatosis: Secondary | ICD-10-CM

## 2019-07-04 DIAGNOSIS — S92502A Displaced unspecified fracture of left lesser toe(s), initial encounter for closed fracture: Secondary | ICD-10-CM | POA: Diagnosis not present

## 2019-07-04 NOTE — Progress Notes (Signed)
Subjective:  Patient ID: Marie Peterson, female    DOB: 01/13/1966,  MRN: [id number]  Chief Complaint  Patient presents with  . Foot Problem    the left foot is doing better and the shoe does seem to be helping     54 y.o. female presents with the above complaint.  Patient states that she does not have pain at the foot fracture anymore.  She was here for follow-up of foot fracture to the second digit.  She also had following up from left plantar fasciitis though been treating.  Patient states that she is 70% better from plantar fasciitis.  She states that shoes are helping as well as the injection that I gave her last time is helping.  She states the brace has been also helping.  She denies any other acute complaints.   Review of Systems: Negative except as noted in the HPI. Denies N/V/F/Ch.  Past Medical History:  Diagnosis Date  . Arthritis   . Atrophic vaginitis   . Atypical chest pain    thought to be GERD; myoview 05/17/07-no significant ischemia; echo 07/20/11- normal EF=>55%  . Baker's cyst 03/28/12   venous doppler 03/28/12=no thrombus; baker's cyst at left popliteal fossa  . Chest pain   . CIN I (cervical intraepithelial neoplasia I) 1990  . Degenerative disc disease, cervical 2020   severe, C3-4, 4-5, 5-6. Currently receiving injections by Dr. Vira Blanco of preferred pain management  . Fibroid   . Fibromyalgia   . Hypertension   . Lupus nephritis ([place])   . Migraines   . Neuropathy   . Osteopenia 09/2017   T score -2.0 improved from prior study  . Premature menopause age 9   following chemotherapy for Lupus  . Shingles     Current Outpatient Medications:  .  AJOVY 225 MG/1.5ML SOSY, INJECT 675 MILLIGRAM SUBCUTANEOUSLY EVERY 3 MONTHS, Disp: 4.5 mL, Rfl: 11 .  alendronate (FOSAMAX) 70 MG tablet, TAKE 1 TABLET BY MOUTH WITH A FULL GLASS OF WATER ON AN EMPTY STOMACH EVERY WEEK, Disp: 4 tablet, Rfl: 12 .  amLODipine (NORVASC) 2.5 MG tablet, Take 2.5 mg by mouth daily., Disp: ,  Rfl:  .  amLODipine (NORVASC) 5 MG tablet, Take 5 mg by mouth daily. , Disp: , Rfl:  .  aspirin 81 MG tablet, Take 81 mg by mouth daily., Disp: , Rfl:  .  BELSOMRA 10 MG TABS, 20 mg. , Disp: , Rfl: 0 .  benazepril (LOTENSIN) 40 MG tablet, Take 1 tablet by mouth daily., Disp: , Rfl: 1 .  benzonatate (TESSALON) 100 MG capsule, Take 100 mg by mouth 3 (three) times daily., Disp: , Rfl:  .  Cholecalciferol (VITAMIN D PO), Take 2,000 Int'l Units by mouth daily. , Disp: , Rfl:  .  clotrimazole-betamethasone (LOTRISONE) cream, Apply 1 application topically 2 (two) times daily., Disp: 30 g, Rfl: 0 .  diclofenac sodium (VOLTAREN) 1 % GEL, Apply topically., Disp: , Rfl:  .  FERREX 150 150 MG capsule, Take 1 tablet by mouth daily., Disp: , Rfl: 1 .  folic acid (FOLVITE) A999333 MCG tablet, Take 400 mcg by mouth daily., Disp: , Rfl:  .  furosemide (LASIX) 40 MG tablet, Take 1 tablet by mouth 2 (two) times daily. TAKE 1 AND 1/2 TABLET IN THE MORNING AND 1 TABLET AT NIGHT., Disp: , Rfl: 0 .  gabapentin (NEURONTIN) 600 MG tablet, Take 600mg  in am, 600mg  at lunch and 1200mg  at bedtime, Disp: 270 tablet, Rfl: 1 .  hydrALAZINE (APRESOLINE) 10 MG tablet, Take 10 mg by mouth 2 (two) times daily., Disp: , Rfl:  .  hydroxychloroquine (PLAQUENIL) 200 MG tablet, Take 200 mg by mouth 2 (two) times daily. , Disp: , Rfl: 0 .  L-Methylfolate-B6-B12 (METANX PO), Take 1 tablet by mouth 2 (two) times daily. , Disp: , Rfl:  .  lidocaine (LIDODERM) 5 %, Place 3 patches onto the skin daily. Remove & Discard patch within 12 hours or as directed by MD (Patient taking differently: Place 2 patches onto the skin daily. Remove & Discard patch within 12 hours or as directed by MD For use as needed), Disp: 90 patch, Rfl: 6 .  LORazepam (ATIVAN) 0.5 MG tablet, Take 0.5 mg by mouth daily as needed., Disp: , Rfl:  .  MAGNESIUM-OXIDE 400 (241.3 Mg) MG tablet, TAKE 1 TABLET BY MOUTH EVERY DAY**PLEASE SCHEDULE APPOINTMENT FOR REFILLS**, Disp: 90  tablet, Rfl: 0 .  Multiple Vitamin (MULTIVITAMIN) tablet, Take 1 tablet by mouth daily., Disp: , Rfl:  .  nabumetone (RELAFEN) 500 MG tablet, Take 500 mg by mouth 2 (two) times daily as needed., Disp: , Rfl: 0 .  neomycin-polymyxin b-dexamethasone (MAXITROL) 3.5-10000-0.1 OINT, Place 1 application into the right eye at bedtime., Disp: , Rfl:  .  Omega-3 Fatty Acids (FISH OIL) 1000 MG CAPS, Take 2 capsules by mouth daily., Disp: , Rfl:  .  omeprazole (PRILOSEC) 40 MG capsule, Take 40 mg by mouth daily., Disp: , Rfl: 1 .  ondansetron (ZOFRAN-ODT) 4 MG disintegrating tablet, DISSOLVE 1 TABLET ON TONGUE EVERY 8 HOURS IF NEEDED FOR NAUSEA AND VOMITING, Disp: 20 tablet, Rfl: 11 .  oxyCODONE-acetaminophen (PERCOCET) 7.5-325 MG tablet, Take 1 tablet by mouth every 8 (eight) hours., Disp: , Rfl:  .  phentermine (ADIPEX-P) 37.5 MG tablet, TK 1 T PO QD, Disp: , Rfl:  .  phentermine 37.5 MG capsule, Take 37.5 mg by mouth every morning., Disp: , Rfl:  .  potassium chloride SA (KLOR-CON) 20 MEQ tablet, TAKE 1 TABLET(20 MEQ) BY MOUTH DAILY, Disp: 90 tablet, Rfl: 3 .  predniSONE (DELTASONE) 5 MG tablet, Take 5 mg by mouth daily., Disp: , Rfl:  .  Rimegepant Sulfate (NURTEC) 75 MG TBDP, Take 75 mg by mouth daily as needed (take for abortive therapy of migraine, no more than 1 tablet in 24 hours or 10 per month)., Disp: 10 tablet, Rfl: 11 .  tolterodine (DETROL LA) 4 MG 24 hr capsule, Take 4 mg by mouth daily., Disp: , Rfl:  .  Ubrogepant (UBRELVY) 50 MG TABS, Take 50 mg by mouth daily as needed. Take one tablet at onset of headache, may repeat 1 tablet in 2 hours, no more than 2 tablets in 24 hours, Disp: 10 tablet, Rfl: 11 .  venlafaxine XR (EFFEXOR-XR) 37.5 MG 24 hr capsule, Take 37.5 mg by mouth daily., Disp: , Rfl:  .  vortioxetine HBr (TRINTELLIX) 10 MG TABS tablet, Take 10 mg by mouth daily., Disp: , Rfl:  .  XIIDRA 5 % SOLN, Place 1 drop into both eyes 2 (two) times daily., Disp: , Rfl:   Social History    Tobacco Use  Smoking Status Never Smoker  Smokeless Tobacco Never Used    Allergies  Allergen Reactions  . Cymbalta [Duloxetine Hcl]     Lip swelling  . Myrbetriq [Mirabegron] Itching   Objective:   Vitals:   07/04/19 0826  Resp: 14  Temp: (!) 97.2 F (36.2 C)   There is no height or weight  on file to calculate BMI. Constitutional Well developed. Well nourished.  Vascular Dorsalis pedis pulses palpable bilaterally. Posterior tibial pulses palpable bilaterally. Capillary refill normal to all digits.  No cyanosis or clubbing noted. Pedal hair growth normal.  Neurologic Normal speech. Oriented to person, place, and time. Epicritic sensation to light touch grossly present bilaterally.  Dermatologic Nails well groomed and normal in appearance. No open wounds. Epidermal lysis of the left plantar foot with associated subjective itching noted.  Orthopedic: Normal joint ROM without pain or crepitus bilaterally. No visible deformities. Tender to palpation at the calcaneal tuber left. No pain with calcaneal squeeze left. Ankle ROM full range of motion left. Silfverskiold Test: negative left.  No pain on palpation to the left dorsal aspect of the second digit at the PIPJ joint.  No pain with range of motion at the MPJ and PIPJ joint of the second toe.   Radiographs: 3 views of skeletally mature adult foot: Fracture line at the head of the proximal phalanx noted.  No callus formation noted.  Appears to be nondisplaced.  No posterior plantar heel spurring noted.  No other bony abnormalities identified.  Assessment:   1. Closed fracture of phalanx of left second toe, initial encounter   2. Plantar fasciitis of left foot   3. Foot pain, left   4. Pes planus of both feet   5. Foot deformity, bilateral    Plan:  Patient was evaluated and treated and all questions answered.  Plantar Fasciitis, left - XR reviewed as above.  - Educated on icing and stretching. Instructions  given.  - Injection delivered to the plantar fascia as below. - DME: Night splint - Pharmacologic management: None. Educated on risks/benefits and proper taking of medication.   Pes planus semiflexible -I explained to the patient the etiology of pes planus deformity and various treatment options were discussed extensively.  I believe patient will benefit from custom-made orthotics to help support the arch of the foot address the plantar fasciitis as well as the calcaneal valgus.  She will be scheduled to see Liliane Channel for custom-made orthotics   Procedure: Injection Tendon/Ligament Location: Left plantar fascia at the glabrous junction; medial approach. Skin Prep: alcohol Injectate: 0.5 cc 0.5% marcaine plain, 0.5 cc of 1% Lidocaine, 0.5 cc kenalog 10. Disposition: Patient tolerated procedure well. Injection site dressed with a band-aid.  Left second digit proximal phalanx fracture nondisplaced -Resolved.  Even though radiographically it is not healed given that patient has healed clinically I will hold off on any surgical intervention at this time.   Athlete's foot left -I discussed with the patient the etiology of athlete's foot and various treatment options associated with it.  Given that there is itching associated with it I believe this is likely fungal in nature and therefore patient will benefit from clotrimazole betamethasone cream.  Patient will apply daily to the affected area.  Patient states understanding and will do so   Return in about 4 weeks (around 08/01/2019), or See Dr. Posey Pronto, for See Liliane Channel for orthotics ASAP.

## 2019-07-07 DIAGNOSIS — S62222A Displaced Rolando's fracture, left hand, initial encounter for closed fracture: Secondary | ICD-10-CM | POA: Diagnosis not present

## 2019-07-07 DIAGNOSIS — X58XXXA Exposure to other specified factors, initial encounter: Secondary | ICD-10-CM | POA: Diagnosis not present

## 2019-07-07 DIAGNOSIS — Y999 Unspecified external cause status: Secondary | ICD-10-CM | POA: Diagnosis not present

## 2019-07-14 ENCOUNTER — Other Ambulatory Visit: Payer: 59

## 2019-07-17 ENCOUNTER — Other Ambulatory Visit: Payer: Self-pay

## 2019-07-17 ENCOUNTER — Ambulatory Visit (INDEPENDENT_AMBULATORY_CARE_PROVIDER_SITE_OTHER): Payer: 59 | Admitting: Orthotics

## 2019-07-17 DIAGNOSIS — M2142 Flat foot [pes planus] (acquired), left foot: Secondary | ICD-10-CM | POA: Diagnosis not present

## 2019-07-17 DIAGNOSIS — M2141 Flat foot [pes planus] (acquired), right foot: Secondary | ICD-10-CM

## 2019-07-17 DIAGNOSIS — M722 Plantar fascial fibromatosis: Secondary | ICD-10-CM | POA: Diagnosis not present

## 2019-07-17 DIAGNOSIS — S92502A Displaced unspecified fracture of left lesser toe(s), initial encounter for closed fracture: Secondary | ICD-10-CM

## 2019-07-17 NOTE — Progress Notes (Signed)

## 2019-07-22 DIAGNOSIS — S62222A Displaced Rolando's fracture, left hand, initial encounter for closed fracture: Secondary | ICD-10-CM | POA: Diagnosis not present

## 2019-07-22 DIAGNOSIS — Z4789 Encounter for other orthopedic aftercare: Secondary | ICD-10-CM | POA: Diagnosis not present

## 2019-08-01 ENCOUNTER — Encounter: Payer: Self-pay | Admitting: Podiatry

## 2019-08-01 ENCOUNTER — Other Ambulatory Visit: Payer: Self-pay | Admitting: Neurology

## 2019-08-01 ENCOUNTER — Ambulatory Visit (INDEPENDENT_AMBULATORY_CARE_PROVIDER_SITE_OTHER): Payer: 59 | Admitting: Podiatry

## 2019-08-01 ENCOUNTER — Other Ambulatory Visit: Payer: Self-pay

## 2019-08-01 DIAGNOSIS — M722 Plantar fascial fibromatosis: Secondary | ICD-10-CM

## 2019-08-01 DIAGNOSIS — M2141 Flat foot [pes planus] (acquired), right foot: Secondary | ICD-10-CM | POA: Diagnosis not present

## 2019-08-01 DIAGNOSIS — M2142 Flat foot [pes planus] (acquired), left foot: Secondary | ICD-10-CM | POA: Diagnosis not present

## 2019-08-01 NOTE — Progress Notes (Signed)
Subjective:  Patient ID: Marie Peterson, female    DOB: Apr 28, 1965,  MRN: [id number]  Chief Complaint  Patient presents with  . Foot Pain    pt is here for left foot pain f/u, pt no longer has any pain, orthotics are in, pt has no other comments or concerns    54 y.o. female presents with the above complaint.  Patient presents with follow-up of left plantar fasciitis.  She states that she does not have any pain.  The injection effort-induced her pain considerably.  She states that the brace has also helped tremendously.  She is also here to pick up her orthotics today.  She denies any other acute complaints.  Pain is 0 out of 10.   Review of Systems: Negative except as noted in the HPI. Denies N/V/F/Ch.  Past Medical History:  Diagnosis Date  . Arthritis   . Atrophic vaginitis   . Atypical chest pain    thought to be GERD; myoview 05/17/07-no significant ischemia; echo 07/20/11- normal EF=>55%  . Baker's cyst 03/28/12   venous doppler 03/28/12=no thrombus; baker's cyst at left popliteal fossa  . Chest pain   . CIN I (cervical intraepithelial neoplasia I) 1990  . Degenerative disc disease, cervical 2020   severe, C3-4, 4-5, 5-6. Currently receiving injections by Dr. Vira Blanco of preferred pain management  . Fibroid   . Fibromyalgia   . Hypertension   . Lupus nephritis ([place])   . Migraines   . Neuropathy   . Osteopenia 09/2017   T score -2.0 improved from prior study  . Premature menopause age 66   following chemotherapy for Lupus  . Shingles     Current Outpatient Medications:  .  AJOVY 225 MG/1.5ML SOSY, INJECT 675 MILLIGRAM SUBCUTANEOUSLY EVERY 3 MONTHS, Disp: 4.5 mL, Rfl: 11 .  alendronate (FOSAMAX) 70 MG tablet, TAKE 1 TABLET BY MOUTH WITH A FULL GLASS OF WATER ON AN EMPTY STOMACH EVERY WEEK, Disp: 4 tablet, Rfl: 12 .  amLODipine (NORVASC) 2.5 MG tablet, Take 2.5 mg by mouth daily., Disp: , Rfl:  .  amLODipine (NORVASC) 5 MG tablet, Take 5 mg by mouth daily. , Disp: , Rfl:  .   aspirin 81 MG tablet, Take 81 mg by mouth daily., Disp: , Rfl:  .  BELSOMRA 10 MG TABS, 20 mg. , Disp: , Rfl: 0 .  benazepril (LOTENSIN) 40 MG tablet, Take 1 tablet by mouth daily., Disp: , Rfl: 1 .  benzonatate (TESSALON) 100 MG capsule, Take 100 mg by mouth 3 (three) times daily., Disp: , Rfl:  .  Cholecalciferol (VITAMIN D PO), Take 2,000 Int'l Units by mouth daily. , Disp: , Rfl:  .  clotrimazole-betamethasone (LOTRISONE) cream, Apply 1 application topically 2 (two) times daily., Disp: 30 g, Rfl: 0 .  diclofenac sodium (VOLTAREN) 1 % GEL, Apply topically., Disp: , Rfl:  .  FERREX 150 150 MG capsule, Take 1 tablet by mouth daily., Disp: , Rfl: 1 .  folic acid (FOLVITE) A999333 MCG tablet, Take 400 mcg by mouth daily., Disp: , Rfl:  .  furosemide (LASIX) 40 MG tablet, Take 1 tablet by mouth 2 (two) times daily. TAKE 1 AND 1/2 TABLET IN THE MORNING AND 1 TABLET AT NIGHT., Disp: , Rfl: 0 .  gabapentin (NEURONTIN) 600 MG tablet, Take 600mg  in am, 600mg  at lunch and 1200mg  at bedtime, Disp: 270 tablet, Rfl: 1 .  hydrALAZINE (APRESOLINE) 10 MG tablet, Take 10 mg by mouth 2 (two) times daily., Disp: ,  Rfl:  .  hydroxychloroquine (PLAQUENIL) 200 MG tablet, Take 200 mg by mouth 2 (two) times daily. , Disp: , Rfl: 0 .  L-Methylfolate-B6-B12 (METANX PO), Take 1 tablet by mouth 2 (two) times daily. , Disp: , Rfl:  .  lidocaine (LIDODERM) 5 %, Place 3 patches onto the skin daily. Remove & Discard patch within 12 hours or as directed by MD (Patient taking differently: Place 2 patches onto the skin daily. Remove & Discard patch within 12 hours or as directed by MD For use as needed), Disp: 90 patch, Rfl: 6 .  LORazepam (ATIVAN) 0.5 MG tablet, Take 0.5 mg by mouth daily as needed., Disp: , Rfl:  .  MAGNESIUM-OXIDE 400 (241.3 Mg) MG tablet, TAKE 1 TABLET BY MOUTH EVERY DAY**PLEASE SCHEDULE APPOINTMENT FOR REFILLS**, Disp: 90 tablet, Rfl: 0 .  Multiple Vitamin (MULTIVITAMIN) tablet, Take 1 tablet by mouth daily.,  Disp: , Rfl:  .  nabumetone (RELAFEN) 500 MG tablet, Take 500 mg by mouth 2 (two) times daily as needed., Disp: , Rfl: 0 .  neomycin-polymyxin b-dexamethasone (MAXITROL) 3.5-10000-0.1 OINT, Place 1 application into the right eye at bedtime., Disp: , Rfl:  .  Omega-3 Fatty Acids (FISH OIL) 1000 MG CAPS, Take 2 capsules by mouth daily., Disp: , Rfl:  .  omeprazole (PRILOSEC) 40 MG capsule, Take 40 mg by mouth daily., Disp: , Rfl: 1 .  ondansetron (ZOFRAN-ODT) 4 MG disintegrating tablet, DISSOLVE 1 TABLET ON TONGUE EVERY 8 HOURS IF NEEDED FOR NAUSEA AND VOMITING, Disp: 20 tablet, Rfl: 11 .  oxyCODONE-acetaminophen (PERCOCET) 7.5-325 MG tablet, Take 1 tablet by mouth every 8 (eight) hours., Disp: , Rfl:  .  phentermine (ADIPEX-P) 37.5 MG tablet, TK 1 T PO QD, Disp: , Rfl:  .  phentermine 37.5 MG capsule, Take 37.5 mg by mouth every morning., Disp: , Rfl:  .  potassium chloride SA (KLOR-CON) 20 MEQ tablet, TAKE 1 TABLET(20 MEQ) BY MOUTH DAILY, Disp: 90 tablet, Rfl: 3 .  predniSONE (DELTASONE) 5 MG tablet, Take 5 mg by mouth daily., Disp: , Rfl:  .  Rimegepant Sulfate (NURTEC) 75 MG TBDP, Take 75 mg by mouth daily as needed (take for abortive therapy of migraine, no more than 1 tablet in 24 hours or 10 per month)., Disp: 10 tablet, Rfl: 11 .  sertraline (ZOLOFT) 100 MG tablet, sertraline 100 mg tablet  TAKE 1 TABLET BY MOUTH EVERY DAY, Disp: , Rfl:  .  tolterodine (DETROL LA) 4 MG 24 hr capsule, Take 4 mg by mouth daily., Disp: , Rfl:  .  Ubrogepant (UBRELVY) 50 MG TABS, Take 50 mg by mouth daily as needed. Take one tablet at onset of headache, may repeat 1 tablet in 2 hours, no more than 2 tablets in 24 hours, Disp: 10 tablet, Rfl: 11 .  venlafaxine XR (EFFEXOR-XR) 37.5 MG 24 hr capsule, Take 37.5 mg by mouth daily., Disp: , Rfl:  .  vortioxetine HBr (TRINTELLIX) 10 MG TABS tablet, Take 10 mg by mouth daily., Disp: , Rfl:  .  XIIDRA 5 % SOLN, Place 1 drop into both eyes 2 (two) times daily., Disp: , Rfl:    Social History   Tobacco Use  Smoking Status Never Smoker  Smokeless Tobacco Never Used    Allergies  Allergen Reactions  . Cymbalta [Duloxetine Hcl]     Lip swelling  . Myrbetriq [Mirabegron] Itching   Objective:   There were no vitals filed for this visit. There is no height or weight on file  to calculate BMI. Constitutional Well developed. Well nourished.  Vascular Dorsalis pedis pulses palpable bilaterally. Posterior tibial pulses palpable bilaterally. Capillary refill normal to all digits.  No cyanosis or clubbing noted. Pedal hair growth normal.  Neurologic Normal speech. Oriented to person, place, and time. Epicritic sensation to light touch grossly present bilaterally.  Dermatologic Nails well groomed and normal in appearance. No open wounds. Epidermal lysis of the left plantar foot with associated subjective itching noted.  Orthopedic: Normal joint ROM without pain or crepitus bilaterally. No visible deformities. No Tender to palpation at the calcaneal tuber left. No pain with calcaneal squeeze left. Ankle ROM full range of motion left. Silfverskiold Test: negative left.  No pain on palpation to the left dorsal aspect of the second digit at the PIPJ joint.  No pain with range of motion at the MPJ and PIPJ joint of the second toe.   Radiographs: 3 views of skeletally mature adult foot: Fracture line at the head of the proximal phalanx noted.  No callus formation noted.  Appears to be nondisplaced.  No posterior plantar heel spurring noted.  No other bony abnormalities identified.  Assessment:   1. Plantar fasciitis of left foot   2. Pes planus of both feet    Plan:  Patient was evaluated and treated and all questions answered.  Plantar Fasciitis, left -Clinically resolved.  Orthotics were dispensed and have asked the patient to break them in properly.  Patient states understanding will do so. -I will see her back as needed or if any other foot and ankle  issues arises in the future.   Pes planus semiflexible -I explained to the patient the etiology of pes planus deformity and various treatment options were discussed extensively.  I believe patient will benefit from custom-made orthotics to help support the arch of the foot address the plantar fasciitis as well as the calcaneal valgus.   -Orthotics were dispensed as directed.  Good correction of gait foot mechanics were noted.    Athlete's foot left -Resolved   No follow-ups on file.

## 2019-08-05 DIAGNOSIS — M25642 Stiffness of left hand, not elsewhere classified: Secondary | ICD-10-CM | POA: Diagnosis not present

## 2019-08-07 ENCOUNTER — Other Ambulatory Visit: Payer: Self-pay | Admitting: Cardiovascular Disease

## 2019-08-07 ENCOUNTER — Other Ambulatory Visit: Payer: 59 | Admitting: Orthotics

## 2019-08-07 ENCOUNTER — Telehealth: Payer: Self-pay | Admitting: *Deleted

## 2019-08-07 NOTE — Telephone Encounter (Signed)
Initiated [place] for Ajovy. Determination pending.

## 2019-08-08 ENCOUNTER — Other Ambulatory Visit: Payer: Self-pay

## 2019-08-08 ENCOUNTER — Encounter: Payer: Self-pay | Admitting: Cardiovascular Disease

## 2019-08-08 ENCOUNTER — Ambulatory Visit (INDEPENDENT_AMBULATORY_CARE_PROVIDER_SITE_OTHER): Payer: 59 | Admitting: Cardiovascular Disease

## 2019-08-08 DIAGNOSIS — I10 Essential (primary) hypertension: Secondary | ICD-10-CM

## 2019-08-08 LAB — HEPATIC FUNCTION PANEL
ALT: 20 IU/L (ref 0–32)
AST: 28 IU/L (ref 0–40)
Albumin: 4.1 g/dL (ref 3.8–4.9)
Alkaline Phosphatase: 101 IU/L (ref 39–117)
Bilirubin Total: 0.4 mg/dL (ref 0.0–1.2)
Bilirubin, Direct: 0.1 mg/dL (ref 0.00–0.40)
Total Protein: 6.9 g/dL (ref 6.0–8.5)

## 2019-08-08 LAB — LIPID PANEL
Chol/HDL Ratio: 2.7 ratio (ref 0.0–4.4)
Cholesterol, Total: 191 mg/dL (ref 100–199)
HDL: 70 mg/dL (ref >39)
LDL Chol Calc (NIH): 111 mg/dL — ABNORMAL HIGH (ref 0–99)
Triglycerides: 54 mg/dL (ref 0–149)
VLDL Cholesterol Cal: 10 mg/dL (ref 5–40)

## 2019-08-08 MED ORDER — AMLODIPINE BESYLATE 5 MG PO TABS: 5.0000 mg | ORAL_TABLET | Freq: Every day | ORAL | 3 refills | Status: DC

## 2019-08-08 NOTE — Patient Instructions (Signed)
Medication Instructions:  INCREASE AMLODIPINE TO 5 MG ONCE DAILY= 2 OF THE 2.5 MG TABLETS ONCE DAILY  *If you need a refill on your cardiac medications before your next appointment, please call your pharmacy*   Lab Work: Your physician recommends that you HAVE LAB WORK TODAY  If you have labs (blood work) drawn today and your tests are completely normal, you will receive your results only by: [name] Kitchen MyChart Message (if you have MyChart) OR . A paper copy in the mail If you have any lab test that is abnormal or we need to change your treatment, we will call you to review the results.   Follow-Up: At [facility], you and your health needs are our priority.  As part of our continuing mission to provide you with exceptional heart care, we have created designated Provider Care Teams.  These Care Teams include your primary Cardiologist (physician) and Advanced Practice Providers (APPs -  Physician Assistants and Nurse Practitioners) who all work together to provide you with the care you need, when you need it.  We recommend signing up for the patient portal called "MyChart".  Sign up information is provided on this After Visit Summary.  MyChart is used to connect with patients for Virtual Visits (Telemedicine).  Patients are able to view lab/test results, encounter notes, upcoming appointments, etc.  Non-urgent messages can be sent to your provider as well.   To learn more about what you can do with MyChart, go to [email or web address].    Your next appointment:   12 month(s)  The format for your next appointment:   Either In Person or Virtual  Provider:   You may see Quay Burow MD or one of the following Advanced Practice Providers on your designated Care Team:    Kerin Ransom, PA-C  [place], [place]  Coletta Memos, [place]    Other Instructions  TRACK BLOOD PRESSURE ONCE DAILY AND CALL IF CONSISTENTLY ABOVE 130/85

## 2019-08-08 NOTE — Progress Notes (Signed)
08/08/2019 Marie Peterson   1965/03/29  [id number]  Primary Physician Marie Gravel, MD Primary Cardiologist: Marie Harp MD Marie Peterson, [place]  HPI:  Marie Peterson is a 54 y.o.  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 in [place] who is formally a patient of Dr. Lowella Peterson. She currently is out on disability because of SLE. I last saw her virtually via telemedicine visit 07/05/2018.[name] KitchenHer factors include treated hypertension. Her father had bypass surgery at age 88. She's never had a heart attack or stroke. She denies chest pain or shortness of breath. She does have GERD. Since I saw her in the office one year ago she continues to have occasional atypical chest pain. A routine GXT performed 11/12/14 was entirely normal.   We obtained a 2D echo 12/28/2016 which was normal and a monitor 01/04/2017 that showed sinus bradycardia with heart rates in the 40s to 60s.  This was performed because of fatigue.  She does get some dyspnea on exertion and I think the majority of her symptoms are related to her rheumatologic disorders including SLE and fibromyalgia which is a recent diagnosis  Apparently her lupus and fibromyalgia are stable since I saw her a year ago.  She denies chest pain or shortness of breath.  She is under a lot of stress personally at home.  Current Meds  Medication Sig  . AJOVY 225 MG/1.5ML SOSY INJECT 675MG  UNDER THE SKIN EVERY 3 MONTHS  . alendronate (FOSAMAX) 70 MG tablet TAKE 1 TABLET BY MOUTH WITH A FULL GLASS OF WATER ON AN EMPTY STOMACH EVERY WEEK  . amLODipine (NORVASC) 5 MG tablet Take 5 mg by mouth daily.   [name] Kitchen aspirin 81 MG tablet Take 81 mg by mouth daily.  . BELSOMRA 10 MG TABS 20 mg.   . benazepril (LOTENSIN) 40 MG tablet Take 1 tablet by mouth daily.  .  benzonatate (TESSALON) 100 MG capsule Take 100 mg by mouth 3 (three) times daily.  . Cholecalciferol (VITAMIN D PO) Take 2,000 Int'l Units by mouth daily.   . clotrimazole-betamethasone (LOTRISONE) cream Apply 1 application topically 2 (two) times daily.  . diclofenac sodium (VOLTAREN) 1 % GEL Apply topically.  [name] Kitchen FERREX 150 150 MG capsule Take 1 tablet by mouth daily.  . folic acid (FOLVITE) A999333 MCG tablet Take 400 mcg by mouth daily.  . furosemide (LASIX) 40 MG tablet Take 1 tablet by mouth 2 (two) times daily. TAKE 1 AND 1/2 TABLET IN THE MORNING AND 1 TABLET AT NIGHT.  [name] Kitchen gabapentin (NEURONTIN) 600 MG tablet Take 600mg  in am, 600mg  at lunch and 1200mg  at bedtime  . hydrALAZINE (APRESOLINE) 10 MG tablet Take 10 mg by mouth 2 (two) times daily.  . hydroxychloroquine (PLAQUENIL) 200 MG tablet Take 200 mg by mouth 2 (two) times daily.   [name] Kitchen L-Methylfolate-B6-B12 (METANX PO) Take 1 tablet by mouth 2 (two) times daily.   [name] Kitchen lidocaine (LIDODERM) 5 % Place 3 patches onto the skin daily. Remove & Discard patch within 12 hours or as directed by MD (Patient taking differently: Place 2 patches onto the skin daily. Remove & Discard patch within 12 hours or as directed by MD For use as needed)  . LORazepam (ATIVAN) 0.5 MG tablet Take 0.5 mg by mouth daily as needed.  [name] Kitchen  MAGNESIUM-OXIDE 400 (241.3 Mg) MG tablet TAKE 1 TABLET BY MOUTH EVERY DAY**PLEASE SCHEDULE APPOINTMENT FOR REFILLS**  . Multiple Vitamin (MULTIVITAMIN) tablet Take 1 tablet by mouth daily.  . nabumetone (RELAFEN) 500 MG tablet Take 500 mg by mouth 2 (two) times daily as needed.  . neomycin-polymyxin b-dexamethasone (MAXITROL) 3.5-10000-0.1 OINT Place 1 application into the right eye at bedtime.  . Omega-3 Fatty Acids (FISH OIL) 1000 MG CAPS Take 2 capsules by mouth daily.  [name] Kitchen omeprazole (PRILOSEC) 40 MG capsule Take 40 mg by mouth daily.  . ondansetron (ZOFRAN-ODT) 4 MG disintegrating tablet DISSOLVE 1 TABLET ON THE TONGUE EVERY 8 HOURS AS NEEDED  FOR NAUSEA OR VOMITING  . oxyCODONE-acetaminophen (PERCOCET) 7.5-325 MG tablet Take 1 tablet by mouth every 8 (eight) hours.  . phentermine (ADIPEX-P) 37.5 MG tablet TK 1 T PO QD  . phentermine 37.5 MG capsule Take 37.5 mg by mouth every morning.  . potassium chloride SA (KLOR-CON) 20 MEQ tablet TAKE 1 TABLET(20 MEQ) BY MOUTH DAILY  . predniSONE (DELTASONE) 5 MG tablet Take 5 mg by mouth daily.  . Rimegepant Sulfate (NURTEC) 75 MG TBDP Take 75 mg by mouth daily as needed (take for abortive therapy of migraine, no more than 1 tablet in 24 hours or 10 per month).  . sertraline (ZOLOFT) 100 MG tablet sertraline 100 mg tablet  TAKE 1 TABLET BY MOUTH EVERY DAY  . tolterodine (DETROL LA) 4 MG 24 hr capsule Take 4 mg by mouth daily.  [name] Kitchen Ubrogepant (UBRELVY) 50 MG TABS Take 50 mg by mouth daily as needed. Take one tablet at onset of headache, may repeat 1 tablet in 2 hours, no more than 2 tablets in 24 hours  . venlafaxine XR (EFFEXOR-XR) 37.5 MG 24 hr capsule Take 37.5 mg by mouth daily.  [name] Kitchen vortioxetine HBr (TRINTELLIX) 10 MG TABS tablet Take 10 mg by mouth daily.  [name] Kitchen XIIDRA 5 % SOLN Place 1 drop into both eyes 2 (two) times daily.  . [DISCONTINUED] amLODipine (NORVASC) 2.5 MG tablet Take 2.5 mg by mouth daily.     Allergies  Allergen Reactions  . Cymbalta [Duloxetine Hcl]     Lip swelling  . Myrbetriq [Mirabegron] Itching    Social History   Socioeconomic History  . Marital status: Married    Spouse name: [name]  . Number of children: 2  . Years of education: College  . Highest education level: Not on file  Occupational History    Employer: DISABLED    Comment: disability  Tobacco Use  . Smoking status: Never Smoker  . Smokeless tobacco: Never Used  Substance and Sexual Activity  . Alcohol use: No    Alcohol/week: 0.0 standard drinks  . Drug use: No  . Sexual activity: Not Currently    Birth control/protection: Surgical, Post-menopausal    Comment: Tubal lig-1st intercourse 54  yo-Fewer than 5 partners  Other Topics Concern  . Not on file  Social History Narrative   Patient lives at home with her daughter. She is currently separated (not legally).   Caffeine Use: 1 cup of coffee daily.    Right-handed   Social Determinants of Health   Financial Resource Strain:   . Difficulty of Paying Living Expenses:   Food Insecurity:   . Worried About Charity fundraiser in the Last Year:   . Arboriculturist in the Last Year:   Transportation Needs:   . Film/video editor (Medical):   [name] Kitchen Lack of Transportation (Non-Medical):  Physical Activity:   . Days of Exercise per Week:   . Minutes of Exercise per Session:   Stress:   . Feeling of Stress :   Social Connections:   . Frequency of Communication with Friends and Family:   . Frequency of Social Gatherings with Friends and Family:   . Attends Religious Services:   . Active Member of Clubs or Organizations:   . Attends Archivist Meetings:   [name] Kitchen Marital Status:   Intimate Partner Violence:   . Fear of Current or Ex-Partner:   . Emotionally Abused:   [name] Kitchen Physically Abused:   . Sexually Abused:      Review of Systems: General: negative for chills, fever, night sweats or weight changes.  Cardiovascular: negative for chest pain, dyspnea on exertion, edema, orthopnea, palpitations, paroxysmal nocturnal dyspnea or shortness of breath Dermatological: negative for rash Respiratory: negative for cough or wheezing Urologic: negative for hematuria Abdominal: negative for nausea, vomiting, diarrhea, bright red blood per rectum, melena, or hematemesis Neurologic: negative for visual changes, syncope, or dizziness All other systems reviewed and are otherwise negative except as noted above.    Blood pressure 140/90, pulse 61, temperature (!) 93.9 F (34.4 C), height 5\' 4"  (1.626 m), weight 176 lb (79.8 kg).  General appearance: alert and no distress Neck: no adenopathy, no carotid bruit, no JVD, supple,  symmetrical, trachea midline and thyroid not enlarged, symmetric, no tenderness/mass/nodules Lungs: clear to auscultation bilaterally Heart: regular rate and rhythm, S1, S2 normal, no murmur, click, rub or gallop Extremities: extremities normal, atraumatic, no cyanosis or edema Pulses: 2+ and symmetric Skin: Skin color, texture, turgor normal. No rashes or lesions Neurologic: Alert and oriented X 3, normal strength and tone. Normal symmetric reflexes. Normal coordination and gait  EKG sinus rhythm at 61 without ST or T wave changes.  I personally reviewed this EKG.  ASSESSMENT AND PLAN:   Essential hypertension History of essential hypertension blood pressure measured today 140/90.  She is on low-dose amlodipine, benazepril and hydralazine.  I am going to increase her amlodipine from 2.5 to 5 mg a day and have her check her blood pressure at home.      Marie Harp MD FACP,FACC,FAHA, [facility] 08/08/2019 8:42 AM

## 2019-08-08 NOTE — Assessment & Plan Note (Signed)
History of essential hypertension blood pressure measured today 140/90.  She is on low-dose amlodipine, benazepril and hydralazine.  I am going to increase her amlodipine from 2.5 to 5 mg a day and have her check her blood pressure at home.

## 2019-08-10 ENCOUNTER — Encounter: Payer: Self-pay | Admitting: Family Medicine

## 2019-08-11 MED ORDER — NURTEC 75 MG PO TBDP: 75.0000 mg | ORAL_TABLET | Freq: Every day | ORAL | 11 refills | Status: DC | PRN

## 2019-08-11 NOTE — Addendum Note (Signed)
Addended by: Debbora Presto L on: 08/11/2019 04:32 PM   Modules accepted: Orders

## 2019-08-12 ENCOUNTER — Telehealth: Payer: Self-pay | Admitting: Cardiovascular Disease

## 2019-08-12 NOTE — Telephone Encounter (Signed)
Called and spoke with pt she states she was instructed to notify Dr.Berry if her BP was > 130/85 List of Bps: 158/109    151/103   151/97    153/100   138/87    Denies sob, cp, blurry vision, headache, or feeling like she is going to pass out. She reports having taken all of her BP medications for this morning. Notified I would send this message to [facility] for review. Pt verbalized understanding with no other questions at this time.

## 2019-08-12 NOTE — Telephone Encounter (Signed)
New message   Pt c/o BP issue: STAT if pt c/o blurred vision, one-sided weakness or slurred speech  1. What are your last 5 BP readings?158/109  151/103   151/97  153/100 138/87  2. Are you having any other symptoms (ex. Dizziness, headache, blurred vision, passed out)? No   3. What is your BP issue? B/p is elevated.

## 2019-08-13 ENCOUNTER — Telehealth: Payer: Self-pay | Admitting: *Deleted

## 2019-08-13 DIAGNOSIS — M25642 Stiffness of left hand, not elsewhere classified: Secondary | ICD-10-CM | POA: Diagnosis not present

## 2019-08-13 NOTE — Telephone Encounter (Signed)
Received detemination of denial [employer] RX [phone]. PA # [id number].  not covered benefit and excluded from coverage.

## 2019-08-13 NOTE — Telephone Encounter (Signed)
Per CMM states drug search not available??  I called pharmacy and she was able to get on 08-07-19 a ajovy 225mg /1.5mg  injection.

## 2019-08-13 NOTE — Telephone Encounter (Signed)
Initiated [place] for Nrutec 75mg  tabs. Determination pending.

## 2019-08-13 NOTE — Telephone Encounter (Signed)
Noted. Message sent to schedulers to schedule appt with pharm D

## 2019-08-13 NOTE — Telephone Encounter (Signed)
Please arrange for her to see a Pharm D to review BP readings, meds , and make necessary adjustments

## 2019-08-14 ENCOUNTER — Encounter: Payer: Self-pay | Admitting: *Deleted

## 2019-08-18 ENCOUNTER — Ambulatory Visit (INDEPENDENT_AMBULATORY_CARE_PROVIDER_SITE_OTHER): Payer: 59 | Admitting: *Deleted

## 2019-08-18 DIAGNOSIS — G43009 Migraine without aura, not intractable, without status migrainosus: Secondary | ICD-10-CM | POA: Diagnosis not present

## 2019-08-18 DIAGNOSIS — Z741 Need for assistance with personal care: Secondary | ICD-10-CM

## 2019-08-18 NOTE — Progress Notes (Signed)
Patientwasgiven her 3 scheduled injections of Ajovy 225 mg each. Total 675 mg. She brought these from home. They were administered inRLQ ofabdomen. Pt tolerated wellandaseptic technique used. Next injectionsscheduled for 11/18/19 @ 8:15 AM.  [id number], [id number]

## 2019-08-19 DIAGNOSIS — S62222A Displaced Rolando's fracture, left hand, initial encounter for closed fracture: Secondary | ICD-10-CM | POA: Diagnosis not present

## 2019-08-19 DIAGNOSIS — Z4789 Encounter for other orthopedic aftercare: Secondary | ICD-10-CM | POA: Diagnosis not present

## 2019-08-19 DIAGNOSIS — M25642 Stiffness of left hand, not elsewhere classified: Secondary | ICD-10-CM | POA: Diagnosis not present

## 2019-08-20 ENCOUNTER — Telehealth: Payer: Self-pay | Admitting: Cardiovascular Disease

## 2019-08-20 NOTE — Telephone Encounter (Signed)
LMTCB to schedule with PharmD HTN

## 2019-08-20 NOTE — Telephone Encounter (Signed)
-----   Message from Gwendel Hanson sent at 08/14/2019  2:39 PM EDT ----- Regarding: HTN referral  ----- Message ----- From: June, Sarah O, RN Sent: 08/13/2019   5:29 PM EDT To: Cv Div Nl Scheduling  Please schedule pt with Pharmacy for blood pressure review per Dr.Berry. thank you!

## 2019-08-21 NOTE — Progress Notes (Signed)
Provider Consideration [employer]) from has been faxed back to [employer] at [phone]  Fax confirmation recieved

## 2019-08-22 DIAGNOSIS — E041 Nontoxic single thyroid nodule: Secondary | ICD-10-CM | POA: Diagnosis not present

## 2019-08-22 DIAGNOSIS — I10 Essential (primary) hypertension: Secondary | ICD-10-CM | POA: Diagnosis not present

## 2019-08-22 DIAGNOSIS — E559 Vitamin D deficiency, unspecified: Secondary | ICD-10-CM | POA: Diagnosis not present

## 2019-08-22 DIAGNOSIS — Z79899 Other long term (current) drug therapy: Secondary | ICD-10-CM | POA: Diagnosis not present

## 2019-08-26 DIAGNOSIS — E785 Hyperlipidemia, unspecified: Secondary | ICD-10-CM | POA: Diagnosis not present

## 2019-08-26 DIAGNOSIS — M1990 Unspecified osteoarthritis, unspecified site: Secondary | ICD-10-CM | POA: Diagnosis not present

## 2019-08-26 DIAGNOSIS — M5030 Other cervical disc degeneration, unspecified cervical region: Secondary | ICD-10-CM | POA: Diagnosis not present

## 2019-08-26 DIAGNOSIS — M329 Systemic lupus erythematosus, unspecified: Secondary | ICD-10-CM | POA: Diagnosis not present

## 2019-08-26 DIAGNOSIS — M797 Fibromyalgia: Secondary | ICD-10-CM | POA: Diagnosis not present

## 2019-08-26 DIAGNOSIS — G43909 Migraine, unspecified, not intractable, without status migrainosus: Secondary | ICD-10-CM | POA: Diagnosis not present

## 2019-08-26 DIAGNOSIS — E010 Iodine-deficiency related diffuse (endemic) goiter: Secondary | ICD-10-CM | POA: Diagnosis not present

## 2019-08-26 DIAGNOSIS — M810 Age-related osteoporosis without current pathological fracture: Secondary | ICD-10-CM | POA: Diagnosis not present

## 2019-08-26 DIAGNOSIS — G4700 Insomnia, unspecified: Secondary | ICD-10-CM | POA: Diagnosis not present

## 2019-08-26 DIAGNOSIS — R001 Bradycardia, unspecified: Secondary | ICD-10-CM | POA: Diagnosis not present

## 2019-08-26 DIAGNOSIS — E041 Nontoxic single thyroid nodule: Secondary | ICD-10-CM | POA: Diagnosis not present

## 2019-08-26 DIAGNOSIS — I10 Essential (primary) hypertension: Secondary | ICD-10-CM | POA: Diagnosis not present

## 2019-08-27 ENCOUNTER — Institutional Professional Consult (permissible substitution): Payer: 59 | Admitting: Neurology

## 2019-09-01 DIAGNOSIS — Z79899 Other long term (current) drug therapy: Secondary | ICD-10-CM | POA: Diagnosis not present

## 2019-09-01 DIAGNOSIS — M329 Systemic lupus erythematosus, unspecified: Secondary | ICD-10-CM | POA: Diagnosis not present

## 2019-09-01 DIAGNOSIS — M810 Age-related osteoporosis without current pathological fracture: Secondary | ICD-10-CM | POA: Diagnosis not present

## 2019-09-01 DIAGNOSIS — M79642 Pain in left hand: Secondary | ICD-10-CM | POA: Diagnosis not present

## 2019-09-01 DIAGNOSIS — M797 Fibromyalgia: Secondary | ICD-10-CM | POA: Diagnosis not present

## 2019-09-01 DIAGNOSIS — M1990 Unspecified osteoarthritis, unspecified site: Secondary | ICD-10-CM | POA: Diagnosis not present

## 2019-09-02 DIAGNOSIS — M25642 Stiffness of left hand, not elsewhere classified: Secondary | ICD-10-CM | POA: Diagnosis not present

## 2019-09-08 DIAGNOSIS — M25642 Stiffness of left hand, not elsewhere classified: Secondary | ICD-10-CM | POA: Diagnosis not present

## 2019-09-09 ENCOUNTER — Other Ambulatory Visit: Payer: Self-pay | Admitting: Neurology

## 2019-09-11 ENCOUNTER — Ambulatory Visit (INDEPENDENT_AMBULATORY_CARE_PROVIDER_SITE_OTHER): Payer: 59 | Admitting: Neurology

## 2019-09-11 ENCOUNTER — Encounter: Payer: Self-pay | Admitting: Neurology

## 2019-09-11 ENCOUNTER — Other Ambulatory Visit: Payer: Self-pay

## 2019-09-11 VITALS — BP 133/82 | HR 57 | Ht 64.0 in | Wt 181.0 lb

## 2019-09-11 DIAGNOSIS — M3214 Glomerular disease in systemic lupus erythematosus: Secondary | ICD-10-CM | POA: Diagnosis not present

## 2019-09-11 DIAGNOSIS — G43101 Migraine with aura, not intractable, with status migrainosus: Secondary | ICD-10-CM

## 2019-09-11 DIAGNOSIS — G253 Myoclonus: Secondary | ICD-10-CM | POA: Insufficient documentation

## 2019-09-11 DIAGNOSIS — M329 Systemic lupus erythematosus, unspecified: Secondary | ICD-10-CM

## 2019-09-11 DIAGNOSIS — R519 Headache, unspecified: Secondary | ICD-10-CM | POA: Diagnosis not present

## 2019-09-11 DIAGNOSIS — R001 Bradycardia, unspecified: Secondary | ICD-10-CM | POA: Diagnosis not present

## 2019-09-11 NOTE — Progress Notes (Signed)
SLEEP MEDICINE CLINIC    Provider:  Larey Seat, MD  Primary Care Physician:  [name], [place] [address]     Referring Provider: [name], [place],  [place] [id number]          Chief Complaint according to patient   Patient presents with:    . New Patient (Initial Visit)     RM 2, alone. Daughter slept at her house for several nights and noted her jerking in her sleep, and irregular breathing. She is separated from spouse, her father passed in May 2021. Stressed with family health matters, states its causing migraines.       HISTORY OF PRESENT ILLNESS:  Marie Peterson is a 54  year-old [place] or [place] American female patient established with Dr. Jaynee Eagles and seen here upon a referral on 09/11/2019 from Dr Maudie Mercury for sleep disordered breathing. Chief concern according to patient :  Marie Peterson reports that her daughter has slept for a while at her house and noticed her to twitch and move during her sleep may be more of a trembling.  She also noted irregular breathing and pauses in her sleep breathing.  For this reason she is referred for evaluation of possible sleep apnea.  Dr. [name] referred.  I reviewed the patient's medication she is on Percocet, she is still on oxycodone.   Prednisone, amlodipine Benzapril, alendronate, omeprazole, Tessalon Perles as needed furosemide 40 mg actually 1.5 tablets in the morning and 1 in the night, hydralazine twice a day, no tach, Belsomra 20 mg Trintellix 10 mg not also on gabapentin 3 times daily. A lot of medications with sleep inducing or sleep changing potential.   Marie Peterson is a right -handed Black or [place] American female with a possible sleep disorder.  She has a past medical history of Lupus, Arthritis, Atrophic vaginitis, Atypical chest pain, Baker's cyst (03/28/12), Chest pain, CIN I (cervical intraepithelial neoplasia I) (1990), Degenerative disc disease,  cervical (2020), Fibroid, "Fibromyalgia", Hypertension, Lupus nephritis ([place]), Migraines, Neuropathy, Osteopenia (09/2017), Premature menopause (age 32), and Shingles.    Sleep relevant medical history: Nocturia- diuretic induced 3-4 times , No Sleep walking, sinus surgery    Family medical /sleep history: niece is another family member on CPAP with OSA.   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 Family status is separated. Tobacco use- none .  ETOH use ;none , Caffeine intake in form of Coffee( 1 cup daily  Soda( none) Tea ( rare ) nor energy drinks. Regular exercise - none .     Sleep habits are as follows: The patient's dinner time is between 6 PM. The patient goes to bed at variable times , 10-11 PM and struggles to sleep-once asleep continues to sleep for 1-3 hours, wakes for many bathroom breaks.   The preferred sleep position is supine of on her side, with the support of 2 pillows. She sleeps with a boot, plantar fasciitis.   Dreams are reportedly frequent.   8.30 AM is the usual rise time. The patient wakes up spontaneously between 4.30 and 5 AM , but tries to get more sleep.   She reports not feeling refreshed or restored in AM, with symptoms such as dry mouth , morning headaches , and residual fatigue. Naps are taken frequently, lasting from 1-2 hours , feels these are  less refreshing than nocturnal sleep. She reportedly yawns al through the day.   "Addendum Amy Lomax/ Dr Jaynee Eagles have followed for Migraine and here is the last note from 03-11-2019: Marie Peterson is a 54 y.o. female here today for follow up. She is under more stress recently with her dad's new diagnosis of prostate and brain cancer. She is one of 12 children and feels that they all look to her for support. She has noticed headaches are worse. Same headache but more frequent. She continues to do well with Ajovy every three months, gabapentin 600mg  three  times daily and Nurtec for abortive therapy. BP has been elevated. She is currently separated from her husband. She is followed by [name]. Effexor was recently switched to Trintellix. She feels that Trintellix is making her sick on her stomach. She is followed closely by  PCP as well. She is taking hydralazine, amlodipine and benazepril for HTN. She is on chronic opioids for pain management. "   Review of Systems: Out of a complete 14 system review, the patient complains of only the following symptoms, and all other reviewed systems are negative.:  Fatigue, sleepiness , snoring, fragmented sleep,  Depression , chronic, major. Insomnia delayed sleep onset and  early , waking up. Nocturia.   How likely are you to doze in the following situations: 0 = not likely, 1 = slight chance, 2 = moderate chance, 3 = high chance   Sitting and Reading? Watching Television? Sitting inactive in a public place (theater or meeting)? As a passenger in a car for an hour without a break? Lying down in the afternoon when circumstances permit? Sitting and talking to someone? Sitting quietly after lunch without alcohol? In a car, while stopped for a few minutes in traffic?   Total = 8/ 24 points,  She does not  describe an irresistible urge to go to sleep.    FSS endorsed at 49/ 63 points.   Social History   Socioeconomic History  . Marital status: Married    Spouse name: [name]  . Number of children: 2  . Years of education: College  . Highest education level: Not on file  Occupational History    Employer: DISABLED    Comment: disability  Tobacco Use  . Smoking status: Never Smoker  . Smokeless tobacco: Never Used  Vaping Use  . Vaping Use: Never used  Substance and Sexual Activity  . Alcohol use: No    Alcohol/week: 0.0 standard drinks  . Drug use: No  . Sexual activity: Not Currently    Birth control/protection: Surgical, Post-menopausal    Comment: Tubal lig-1st intercourse 54 yo-Fewer  than 5 partners  Other Topics Concern  . Not on file  Social History Narrative   Patient lives at home with her daughter. She is currently separated (not legally).   Caffeine Use: 1 cup of coffee daily.    Right-handed   Social Determinants of Health   Financial Resource Strain:   . Difficulty of Paying Living Expenses:   Food Insecurity:   . Worried About Charity fundraiser in the Last Year:   . Arboriculturist in the Last Year:   Transportation Needs:   . Film/video editor (Medical):   [name] Kitchen Lack of Transportation (Non-Medical):   Physical Activity:   . Days of Exercise per Week:   . Minutes of Exercise per Session:   Stress:   . Feeling of Stress :   Social Connections:   .  Frequency of Communication with Friends and Family:   . Frequency of Social Gatherings with Friends and Family:   . Attends Religious Services:   . Active Member of Clubs or Organizations:   . Attends Archivist Meetings:   [name] Kitchen Marital Status:     Family History  Problem Relation Age of Onset  . Hypertension Mother   . Asthma Mother   . COPD Mother   . Diabetes Father   . Hypertension Father   . Heart disease Father     Past Medical History:  Diagnosis Date  . Arthritis   . Atrophic vaginitis   . Atypical chest pain    thought to be GERD; myoview 05/17/07-no significant ischemia; echo 07/20/11- normal EF=>55%  . Baker's cyst 03/28/12   venous doppler 03/28/12=no thrombus; baker's cyst at left popliteal fossa  . Chest pain   . CIN I (cervical intraepithelial neoplasia I) 1990  . Degenerative disc disease, cervical 2020   severe, C3-4, 4-5, 5-6. Currently receiving injections by Dr. Vira Blanco of preferred pain management  . Fibroid   . Fibromyalgia dr Vira Blanco   . Hypertension   . Lupus nephritis ([place])   . Migraines  Dr Jaynee Eagles    . Neuropathy   . Osteopenia 09/2017   T score -2.0 improved from prior study  . Premature menopause age 14   following chemotherapy for Lupus  . Shingles      Past Surgical History:  Procedure Laterality Date  . cervical fossa injections  2020  . CESAREAN SECTION    . COLPOSCOPY    . HYSTEROSCOPY     Myomectomy  . MYOMECTOMY     Hysteroscopic myomectomy  . NASAL SINUS SURGERY  01/2016  . TUBAL LIGATION       Current Outpatient Medications on File Prior to Visit  Medication Sig Dispense Refill  . AJOVY 225 MG/1.5ML SOSY INJECT 675MG  UNDER THE SKIN EVERY 3 MONTHS 4.5 mL 11  . alendronate (FOSAMAX) 70 MG tablet TAKE 1 TABLET BY MOUTH WITH A FULL GLASS OF WATER ON AN EMPTY STOMACH EVERY WEEK 4 tablet 12  . amLODipine (NORVASC) 5 MG tablet Take 1 tablet (5 mg total) by mouth daily. 90 tablet 3  . aspirin 81 MG tablet Take 81 mg by mouth daily.    . BELSOMRA 10 MG TABS 20 mg.   0  . benazepril (LOTENSIN) 40 MG tablet Take 1 tablet by mouth daily.  1  . benzonatate (TESSALON) 100 MG capsule Take 100 mg by mouth 3 (three) times daily.    . Cholecalciferol (VITAMIN D PO) Take 2,000 Int'l Units by mouth daily.     . clotrimazole-betamethasone (LOTRISONE) cream Apply 1 application topically 2 (two) times daily. 30 g 0  . diclofenac sodium (VOLTAREN) 1 % GEL Apply topically.    [name] Kitchen FERREX 150 150 MG capsule Take 1 tablet by mouth daily.  1  . folic acid (FOLVITE) 269 MCG tablet Take 400 mcg by mouth daily.    . furosemide (LASIX) 40 MG tablet Take 1 tablet by mouth 2 (two) times daily. TAKE 1 AND 1/2 TABLET IN THE MORNING AND 1 TABLET AT NIGHT.  0  . gabapentin (NEURONTIN) 600 MG tablet Take 600mg  in am, 600mg  at lunch and 1200mg  at bedtime 270 tablet 1  . hydrALAZINE (APRESOLINE) 10 MG tablet Take 10 mg by mouth 2 (two) times daily.    . hydroxychloroquine (PLAQUENIL) 200 MG tablet Take 200 mg by mouth 2 (two) times daily.  0  . L-Methylfolate-B6-B12 (METANX PO) Take 1 tablet by mouth 2 (two) times daily.     [name] Kitchen lidocaine (LIDODERM) 5 % Place 3 patches onto the skin daily. Remove & Discard patch within 12 hours or as directed by MD (Patient taking  differently: Place 2 patches onto the skin daily. Remove & Discard patch within 12 hours or as directed by MD For use as needed) 90 patch 6  . LORazepam (ATIVAN) 0.5 MG tablet Take 0.5 mg by mouth daily as needed.    [name] Kitchen MAGNESIUM-OXIDE 400 (241.3 Mg) MG tablet TAKE 1 TABLET BY MOUTH EVERY DAY**PLEASE SCHEDULE APPOINTMENT FOR REFILLS** 90 tablet 0  . Multiple Vitamin (MULTIVITAMIN) tablet Take 1 tablet by mouth daily.    . nabumetone (RELAFEN) 500 MG tablet Take 500 mg by mouth 2 (two) times daily as needed.  0  . neomycin-polymyxin b-dexamethasone (MAXITROL) 3.5-10000-0.1 OINT Place 1 application into the right eye at bedtime.    . Omega-3 Fatty Acids (FISH OIL) 1000 MG CAPS Take 2 capsules by mouth daily.    [name] Kitchen omeprazole (PRILOSEC) 40 MG capsule Take 40 mg by mouth daily.  1  . ondansetron (ZOFRAN-ODT) 4 MG disintegrating tablet DISSOLVE 1 TABLET ON THE TONGUE EVERY 8 HOURS AS NEEDED FOR NAUSEA OR VOMITING 20 tablet 11  . oxyCODONE-acetaminophen (PERCOCET) 7.5-325 MG tablet Take 1 tablet by mouth every 8 (eight) hours.    . potassium chloride SA (KLOR-CON) 20 MEQ tablet TAKE 1 TABLET(20 MEQ) BY MOUTH DAILY 90 tablet 3  . predniSONE (DELTASONE) 5 MG tablet Take 5 mg by mouth daily.    . Rimegepant Sulfate (NURTEC) 75 MG TBDP Take 75 mg by mouth daily as needed (take for abortive therapy of migraine, no more than 1 tablet in 24 hours or 10 per month). 10 tablet 11  . sertraline (ZOLOFT) 100 MG tablet sertraline 100 mg tablet  TAKE 1 TABLET BY MOUTH EVERY DAY    . sertraline (ZOLOFT) 100 MG tablet Take 100 mg by mouth daily.    [name] Kitchen tolterodine (DETROL LA) 4 MG 24 hr capsule Take 4 mg by mouth daily.    [name] Kitchen venlafaxine XR (EFFEXOR-XR) 37.5 MG 24 hr capsule Take 37.5 mg by mouth daily.    [name] Kitchen XIIDRA 5 % SOLN Place 1 drop into both eyes 2 (two) times daily.     No current facility-administered medications on file prior to visit.    Allergies  Allergen Reactions  . Ubrogepant Swelling    Lip swelling     . Cymbalta [Duloxetine Hcl]     Lip swelling  . Myrbetriq [Mirabegron] Itching    Physical exam:  Today's Vitals   09/11/19 0914  BP: 133/82  Pulse: (!) 57  Weight: 181 lb (82.1 kg)  Height: 5\' 4"  (1.626 m)   Body mass index is 31.07 kg/m.   Wt Readings from Last 3 Encounters:  09/11/19 181 lb (82.1 kg)  08/08/19 176 lb (79.8 kg)  03/11/19 169 lb (76.7 kg)     Ht Readings from Last 3 Encounters:  09/11/19 5\' 4"  (1.626 m)  08/08/19 5\' 4"  (1.626 m)  03/11/19 5\' 4"  (1.626 m)      General: The patient is awake, alert and appears not in acute distress. The patient is well groomed. Head: Normocephalic, atraumatic. Neck is supple. Mallampati 2,  neck circumference: 15 inches . Nasal airflow  patent.  Retrognathia is seen.  Dental status: native  Cardiovascular:  Regular rate and cardiac rhythm by pulse,  without distended neck veins. Respiratory: Lungs are clear to auscultation.  Skin:  Without evidence of ankle edema, or rash. Trunk: The patient's posture is erect.   Neurologic exam : The patient is awake and alert, oriented to place and time.   Memory subjective described as intact.  Attention span & concentration ability appears normal.  Speech is fluent,  without  dysarthria, dysphonia or aphasia.  Mood and affect are appropriate.   Cranial nerves: no loss of smell or taste reported  Pupils are equal and briskly reactive to light. Funduscopic exam deferred.   Extraocular movements in vertical and horizontal planes were intact . No Diplopia. Visual fields by finger perimetry are intact. Hearing was intact to soft voice and finger rubbing.    Facial sensation intact to fine touch.  Facial motor strength is symmetric and tongue and uvula move midline.  Neck ROM : rotation, tilt and flexion extension were normal for age and shoulder shrug was symmetrical.  Left shoulder appears lower.    Motor exam:  Symmetric bulk, tone and ROM.  good relaxation.  Normal tone  without cog- wheeling, symmetric grip strength .   Sensory:  Fine touch, pinprick and vibration were tested  and  normal.  Proprioception tested in the upper extremities was normal.   Coordination: Rapid alternating movements in the fingers/hands were of normal speed.  The Finger-to-nose maneuver was intact without evidence of ataxia, dysmetria or tremor.   Gait and station: Patient could rise unassisted from a seated position, walked without assistive device.  Toe and heel walk were deferred.  Deep tendon reflexes: in the  upper and lower extremities are symmetric and intact.  Babinski response was deferred,      After spending a total time of  45  minutes face to face and additional time for physical and neurologic examination, review of laboratory studies,  personal review of imaging studies, reports and results of other testing and review of referral information / records as far as provided in visit, I have established the following assessments:  1)  Daughter witnessed twitching/ trembling in her mother's sleep , but there are episodes of tremor/ jerking in daytime - not strictly sleep related.  2)  Sleep breathing was irregular , no report of snoring.  3)  Insomnia is chronic and related to psychosocial stressors, ruminating thoughts.    My Plan is to proceed with:  1) I would prefer an attended sleep study to evaluate for sleep movements, but there are no reported sleep behaviors or parasomnias.   2)  HST will be permitted by [facility], yet we are booked for several weeks out form here. I assured Marie Peterson she will be tested by later July - early August.   I would like to thank [name], MD and [name], [place],  [place] [address] for allowing me to meet with and to take care of this pleasant patient.   II plan to follow up either personally or through our NP within 2-3 month post test .   CC: I will share my notes with PCP   Electronically  signed by: Larey Seat, MD 09/11/2019 9:15 AM  [facility] [place] certified by The [employer] of Sleep Medicine and Diplomate of the [employer] of Sleep Medicine. Board certified In Neurology through the [employer], Fellow of the [employer] of Neurology. Medical Director of [employer].

## 2019-09-16 DIAGNOSIS — M25642 Stiffness of left hand, not elsewhere classified: Secondary | ICD-10-CM | POA: Diagnosis not present

## 2019-09-23 DIAGNOSIS — M25642 Stiffness of left hand, not elsewhere classified: Secondary | ICD-10-CM | POA: Diagnosis not present

## 2019-09-26 DIAGNOSIS — S62222A Displaced Rolando's fracture, left hand, initial encounter for closed fracture: Secondary | ICD-10-CM | POA: Diagnosis not present

## 2019-09-26 DIAGNOSIS — Z4789 Encounter for other orthopedic aftercare: Secondary | ICD-10-CM | POA: Diagnosis not present

## 2019-09-29 NOTE — Progress Notes (Signed)
10/01/2019 Marie Peterson 1965/05/03 [id number]   HPI:  Marie Peterson is a 54 y.o. female patient of Dr Gwenlyn Found,  who presents today for hypertension clinic evaluation.  In addition to hypertension, her medial history is significant for migraine headaches and SLE.  She was seen by Dr. Gwenlyn Found in May and found to have a BP of 140/90.  At that time he increased her amlodipine from 2.5 to 5 mg daily.   She returns today for CVRR evalauation.    She notes that she has had migraines for many years, as well as arthritis and fibromyalgia.  Also, more stress lately, as her father recently passed away and she is currently going thru a divorce.  Confirmed medication list, including that she takes nabumetone 500 mg twice daily on a regular basis.  No GI issues from long-term use at this time.    Blood Pressure Goal:  130/80  Current Medications: benazepril 40 mg qd (am), amlodipine 5 mg qd (an), hydralazine 10 mg bid  Family Hx: both parents with hypertension; father died earlier this year at 66 from cancer; mother living at 81, 2 children 38, 66 w/o issues, siblings also healthy.    Social Hx: no tobacco, no alcohol; 1 cup coffee in the mornings  Diet:  Mostly home cooked, with occasional take out; some added salt not often; mix of f/v (moslty fresh, occasional frozen); protein mainly chicken, lean ground beef, fish;   Exercise: no regular exercise; some PT for finger injury  Home BP readings: 120's at home, nothing at 140 since saw Dr. Gwenlyn Found; diastolic mainly 70-78'M  Intolerances: no cardiac medication intolerances  Labs:  11/2018:  Na 144, K 4.2, Glu 79,BUN 16,SCr 0.84  Wt Readings from Last 3 Encounters:  09/30/19 180 lb 12.8 oz (82 kg)  09/11/19 181 lb (82.1 kg)  08/08/19 176 lb (79.8 kg)   BP Readings from Last 3 Encounters:  09/30/19 140/88  09/11/19 133/82  08/08/19 140/90   Pulse Readings from Last 3 Encounters:  09/30/19 (!) 54  09/11/19 (!) 57  08/08/19 61    Current  Outpatient Medications  Medication Sig Dispense Refill  . AJOVY 225 MG/1.5ML SOSY INJECT 675MG  UNDER THE SKIN EVERY 3 MONTHS 4.5 mL 11  . alendronate (FOSAMAX) 70 MG tablet TAKE 1 TABLET BY MOUTH WITH A FULL GLASS OF WATER ON AN EMPTY STOMACH EVERY WEEK 4 tablet 12  . amLODipine (NORVASC) 5 MG tablet Take 1 tablet (5 mg total) by mouth daily. 90 tablet 3  . aspirin 81 MG tablet Take 81 mg by mouth daily.    . BELSOMRA 10 MG TABS 20 mg.   0  . benazepril (LOTENSIN) 40 MG tablet Take 1 tablet by mouth daily.  1  . benzonatate (TESSALON) 100 MG capsule Take 100 mg by mouth 3 (three) times daily.    . Cholecalciferol (VITAMIN D PO) Take 2,000 Int'l Units by mouth daily.     . clotrimazole-betamethasone (LOTRISONE) cream Apply 1 application topically 2 (two) times daily. 30 g 0  . diclofenac sodium (VOLTAREN) 1 % GEL Apply topically.    [name] Kitchen FERREX 150 150 MG capsule Take 1 tablet by mouth daily.  1  . folic acid (FOLVITE) 754 MCG tablet Take 400 mcg by mouth daily.    . furosemide (LASIX) 40 MG tablet Take 1 tablet by mouth 2 (two) times daily. TAKE 1 AND 1/2 TABLET IN THE MORNING AND 1 TABLET AT NIGHT.  0  .  gabapentin (NEURONTIN) 600 MG tablet Take 600mg  in am, 600mg  at lunch and 1200mg  at bedtime 270 tablet 1  . hydrALAZINE (APRESOLINE) 10 MG tablet Take 10 mg by mouth 2 (two) times daily.    . hydroxychloroquine (PLAQUENIL) 200 MG tablet Take 200 mg by mouth 2 (two) times daily.   0  . L-Methylfolate-B6-B12 (METANX PO) Take 1 tablet by mouth 2 (two) times daily.     [name] Kitchen lidocaine (LIDODERM) 5 % Place 3 patches onto the skin daily. Remove & Discard patch within 12 hours or as directed by MD (Patient taking differently: Place 2 patches onto the skin daily. Remove & Discard patch within 12 hours or as directed by MD For use as needed) 90 patch 6  . LORazepam (ATIVAN) 0.5 MG tablet Take 0.5 mg by mouth daily as needed.    [name] Kitchen MAGNESIUM-OXIDE 400 (241.3 Mg) MG tablet TAKE 1 TABLET BY MOUTH EVERY  DAY**PLEASE SCHEDULE APPOINTMENT FOR REFILLS** 90 tablet 0  . Multiple Vitamin (MULTIVITAMIN) tablet Take 1 tablet by mouth daily.    . nabumetone (RELAFEN) 500 MG tablet Take 500 mg by mouth 2 (two) times daily as needed.  0  . Omega-3 Fatty Acids (FISH OIL) 1000 MG CAPS Take 2 capsules by mouth daily.    [name] Kitchen omeprazole (PRILOSEC) 40 MG capsule Take 40 mg by mouth daily.  1  . ondansetron (ZOFRAN-ODT) 4 MG disintegrating tablet DISSOLVE 1 TABLET ON THE TONGUE EVERY 8 HOURS AS NEEDED FOR NAUSEA OR VOMITING 20 tablet 11  . oxyCODONE-acetaminophen (PERCOCET) 7.5-325 MG tablet Take 1 tablet by mouth every 8 (eight) hours.    . potassium chloride SA (KLOR-CON) 20 MEQ tablet TAKE 1 TABLET(20 MEQ) BY MOUTH DAILY 90 tablet 3  . predniSONE (DELTASONE) 5 MG tablet Take 5 mg by mouth daily.    . Rimegepant Sulfate (NURTEC) 75 MG TBDP Take 75 mg by mouth daily as needed (take for abortive therapy of migraine, no more than 1 tablet in 24 hours or 10 per month). 10 tablet 11  . sertraline (ZOLOFT) 100 MG tablet sertraline 100 mg tablet  TAKE 1 TABLET BY MOUTH EVERY DAY    . tolterodine (DETROL LA) 4 MG 24 hr capsule Take 4 mg by mouth daily.    [name] Kitchen XIIDRA 5 % SOLN Place 1 drop into both eyes 2 (two) times daily.    [name] Kitchen venlafaxine XR (EFFEXOR-XR) 37.5 MG 24 hr capsule Take 37.5 mg by mouth daily. (Patient not taking: Reported on 09/30/2019)     No current facility-administered medications for this visit.    Allergies  Allergen Reactions  . Ubrogepant Swelling    Lip swelling   . Cymbalta [Duloxetine Hcl]     Lip swelling  . Myrbetriq [Mirabegron] Itching    Past Medical History:  Diagnosis Date  . Arthritis   . Atrophic vaginitis   . Atypical chest pain    thought to be GERD; myoview 05/17/07-no significant ischemia; echo 07/20/11- normal EF=>55%  . Baker's cyst 03/28/12   venous doppler 03/28/12=no thrombus; baker's cyst at left popliteal fossa  . Chest pain   . CIN I (cervical intraepithelial neoplasia  I) 1990  . Degenerative disc disease, cervical 2020   severe, C3-4, 4-5, 5-6. Currently receiving injections by Dr. Vira Blanco of preferred pain management  . Fibroid   . Fibromyalgia   . Hypertension   . Lupus nephritis ([place])   . Migraines   . Neuropathy   . Osteopenia 09/2017   T score -2.0  improved from prior study  . Premature menopause age 82   following chemotherapy for Lupus  . Shingles     Blood pressure 140/88, pulse (!) 54, height 5\' 4"  (1.626 m), weight 180 lb 12.8 oz (82 kg).  Essential hypertension Patient with mainly diastolic hypertension, currently taking benazepril 40 mg, amlodipine 5 mg, both once daily, and hydralazine 10 mg bid.  To start I am going to have her move either the benazepril or amlodipine to evenings, so that the 2 meds are 12 hours apart.  The hydralazine is probably not adding any benefit at such a low dose, and is not one of the better meds at specifically reducing diastolic readings.  We will see her again in 4-6 weeks for follow up.  She will need to check home BP readings on a daily basis and bring those, along with her meter, to her next appointment.  At that time, if all is accurate, we can consider discontinuing the hydralazine and adding a low dose of chlorthalidone (12.5 mg) to get better diastolic control.  Patient agreeable with this plan.   Tommy Medal PharmD CPP [facility] [address] [place], [place] [address] [phone]

## 2019-09-30 ENCOUNTER — Other Ambulatory Visit: Payer: Self-pay

## 2019-09-30 ENCOUNTER — Ambulatory Visit (INDEPENDENT_AMBULATORY_CARE_PROVIDER_SITE_OTHER): Payer: 59 | Admitting: Pharmacist Clinician (PhC)/ Clinical Pharmacy Specialist

## 2019-09-30 DIAGNOSIS — I10 Essential (primary) hypertension: Secondary | ICD-10-CM

## 2019-09-30 NOTE — Patient Instructions (Signed)
Return for a a follow up appointment August 17  Check your blood pressure at home daily and keep record of the readings.  Take your BP meds as follows:  Switch one of your BP meds (either benazepril or amlodipine) to evenings.    Continue with all other medications  Bring all of your meds, your BP cuff and your record of home blood pressures to your next appointment.  Exercise as you're able, try to walk approximately 30 minutes per day.  Keep salt intake to a minimum, especially watch canned and prepared boxed foods.  Eat more fresh fruits and vegetables and fewer canned items.  Avoid eating in fast food restaurants.    HOW TO TAKE YOUR BLOOD PRESSURE: . Rest 5 minutes before taking your blood pressure. .  Don't smoke or drink caffeinated beverages for at least 30 minutes before. . Take your blood pressure before (not after) you eat. . Sit comfortably with your back supported and both feet on the floor (don't cross your legs). . Elevate your arm to heart level on a table or a desk. . Use the proper sized cuff. It should fit smoothly and snugly around your bare upper arm. There should be enough room to slip a fingertip under the cuff. The bottom edge of the cuff should be 1 inch above the crease of the elbow. . Ideally, take 3 measurements at one sitting and record the average.

## 2019-10-01 ENCOUNTER — Encounter: Payer: Self-pay | Admitting: Pharmacist Clinician (PhC)/ Clinical Pharmacy Specialist

## 2019-10-01 DIAGNOSIS — F331 Major depressive disorder, recurrent, moderate: Secondary | ICD-10-CM | POA: Diagnosis not present

## 2019-10-01 NOTE — Assessment & Plan Note (Signed)
Patient with mainly diastolic hypertension, currently taking benazepril 40 mg, amlodipine 5 mg, both once daily, and hydralazine 10 mg bid.  To start I am going to have her move either the benazepril or amlodipine to evenings, so that the 2 meds are 12 hours apart.  The hydralazine is probably not adding any benefit at such a low dose, and is not one of the better meds at specifically reducing diastolic readings.  We will see her again in 4-6 weeks for follow up.  She will need to check home BP readings on a daily basis and bring those, along with her meter, to her next appointment.  At that time, if all is accurate, we can consider discontinuing the hydralazine and adding a low dose of chlorthalidone (12.5 mg) to get better diastolic control.  Patient agreeable with this plan.

## 2019-10-09 ENCOUNTER — Other Ambulatory Visit: Payer: Self-pay

## 2019-10-09 ENCOUNTER — Encounter: Payer: 59 | Admitting: Gynecology

## 2019-10-09 ENCOUNTER — Ambulatory Visit (INDEPENDENT_AMBULATORY_CARE_PROVIDER_SITE_OTHER): Payer: 59 | Admitting: Obstetrics and Gynecology

## 2019-10-09 ENCOUNTER — Encounter: Payer: Self-pay | Admitting: Obstetrics and Gynecology

## 2019-10-09 ENCOUNTER — Encounter: Payer: 59 | Admitting: Obstetrics and Gynecology

## 2019-10-09 VITALS — BP 122/80 | Ht 64.0 in | Wt 184.0 lb

## 2019-10-09 DIAGNOSIS — M8580 Other specified disorders of bone density and structure, unspecified site: Secondary | ICD-10-CM | POA: Diagnosis not present

## 2019-10-09 DIAGNOSIS — Z01419 Encounter for gynecological examination (general) (routine) without abnormal findings: Secondary | ICD-10-CM

## 2019-10-09 NOTE — Progress Notes (Signed)
Marie Peterson September 07, 1965 [id number]  SUBJECTIVE:  54 y.o. [id number] female for annual routine gynecologic exam. She has no gynecologic concerns.  Current Outpatient Medications  Medication Sig Dispense Refill  . AJOVY 225 MG/1.5ML SOSY INJECT 675MG  UNDER THE SKIN EVERY 3 MONTHS 4.5 mL 11  . alendronate (FOSAMAX) 70 MG tablet TAKE 1 TABLET BY MOUTH WITH A FULL GLASS OF WATER ON AN EMPTY STOMACH EVERY WEEK 4 tablet 12  . amLODipine (NORVASC) 5 MG tablet Take 1 tablet (5 mg total) by mouth daily. 90 tablet 3  . aspirin 81 MG tablet Take 81 mg by mouth daily.    . BELSOMRA 10 MG TABS 20 mg.   0  . benazepril (LOTENSIN) 40 MG tablet Take 1 tablet by mouth daily.  1  . benzonatate (TESSALON) 100 MG capsule Take 100 mg by mouth 3 (three) times daily.    . Cholecalciferol (VITAMIN D PO) Take 2,000 Int'l Units by mouth daily.     . clotrimazole-betamethasone (LOTRISONE) cream Apply 1 application topically 2 (two) times daily. 30 g 0  . diclofenac sodium (VOLTAREN) 1 % GEL Apply topically.    [name] Kitchen FERREX 150 150 MG capsule Take 1 tablet by mouth daily.  1  . folic acid (FOLVITE) 188 MCG tablet Take 400 mcg by mouth daily.    . furosemide (LASIX) 40 MG tablet Take 1 tablet by mouth 2 (two) times daily. TAKE 1 AND 1/2 TABLET IN THE MORNING AND 1 TABLET AT NIGHT.  0  . gabapentin (NEURONTIN) 600 MG tablet Take 600mg  in am, 600mg  at lunch and 1200mg  at bedtime 270 tablet 1  . hydrALAZINE (APRESOLINE) 10 MG tablet Take 10 mg by mouth 2 (two) times daily.    . hydroxychloroquine (PLAQUENIL) 200 MG tablet Take 200 mg by mouth 2 (two) times daily.   0  . L-Methylfolate-B6-B12 (METANX PO) Take 1 tablet by mouth 2 (two) times daily.     [name] Kitchen lidocaine (LIDODERM) 5 % Place 3 patches onto the skin daily. Remove & Discard patch within 12 hours or as directed by MD (Patient taking differently: Place 2 patches onto the skin daily. Remove & Discard patch within 12 hours or as directed by MD For use as needed) 90 patch 6    . LORazepam (ATIVAN) 0.5 MG tablet Take 0.5 mg by mouth daily as needed.    [name] Kitchen MAGNESIUM-OXIDE 400 (241.3 Mg) MG tablet TAKE 1 TABLET BY MOUTH EVERY DAY**PLEASE SCHEDULE APPOINTMENT FOR REFILLS** 90 tablet 0  . Multiple Vitamin (MULTIVITAMIN) tablet Take 1 tablet by mouth daily.    . nabumetone (RELAFEN) 500 MG tablet Take 500 mg by mouth 2 (two) times daily as needed.  0  . Omega-3 Fatty Acids (FISH OIL) 1000 MG CAPS Take 2 capsules by mouth daily.    [name] Kitchen omeprazole (PRILOSEC) 40 MG capsule Take 40 mg by mouth daily.  1  . ondansetron (ZOFRAN-ODT) 4 MG disintegrating tablet DISSOLVE 1 TABLET ON THE TONGUE EVERY 8 HOURS AS NEEDED FOR NAUSEA OR VOMITING 20 tablet 11  . oxyCODONE-acetaminophen (PERCOCET) 7.5-325 MG tablet Take 1 tablet by mouth every 8 (eight) hours.    . potassium chloride SA (KLOR-CON) 20 MEQ tablet TAKE 1 TABLET(20 MEQ) BY MOUTH DAILY 90 tablet 3  . predniSONE (DELTASONE) 5 MG tablet Take 5 mg by mouth daily.    . Rimegepant Sulfate (NURTEC) 75 MG TBDP Take 75 mg by mouth daily as needed (take for abortive therapy of migraine, no more than 1  tablet in 24 hours or 10 per month). 10 tablet 11  . sertraline (ZOLOFT) 100 MG tablet sertraline 100 mg tablet  TAKE 1 TABLET BY MOUTH EVERY DAY    . tolterodine (DETROL LA) 4 MG 24 hr capsule Take 4 mg by mouth daily.    [name] Kitchen XIIDRA 5 % SOLN Place 1 drop into both eyes 2 (two) times daily.    [name] Kitchen venlafaxine XR (EFFEXOR-XR) 37.5 MG 24 hr capsule Take 37.5 mg by mouth daily. (Patient not taking: Reported on 09/30/2019)     No current facility-administered medications for this visit.   Allergies: Ubrogepant, Cymbalta [duloxetine hcl], and Myrbetriq [mirabegron]  No LMP recorded. Patient is postmenopausal.  Past medical history,surgical history, problem list, medications, allergies, family history and social history were all reviewed and documented as reviewed in the EPIC chart.  ROS:  Feeling well. No dyspnea or chest pain on exertion.  No  abdominal pain, change in bowel habits, black or bloody stools.  No urinary tract symptoms. GYN ROS: no abnormal bleeding, pelvic pain or discharge, no breast pain or new or enlarging lumps on self exam.No neurological complaints.    OBJECTIVE:  BP 122/80   Ht 5\' 4"  (1.626 m)   Wt 184 lb (83.5 kg)   BMI 31.58 kg/m  The patient appears well, alert, oriented x 3, in no distress. ENT normal.  Neck supple. No cervical or supraclavicular adenopathy or thyromegaly.  Lungs are clear, good air entry, no wheezes, rhonchi or rales. S1 and S2 normal, no murmurs, regular rate and rhythm.  Abdomen soft without tenderness, guarding, mass or organomegaly.  Neurological is normal, no focal findings.  BREAST EXAM: breasts appear normal, no suspicious masses, no skin or nipple changes or axillary nodes  PELVIC EXAM: VULVA: normal appearing vulva with no masses, tenderness or lesions, VAGINA: normal appearing vagina with normal color and discharge, no lesions, CERVIX: atrophic cervix flush with upper vagina, without discharge or lesions, UTERUS: difficult to palpate but no gross abnormality in size, shape, consistency and nontender, ADNEXA: normal adnexa in size, nontender and no masses  Aurora Mask (DNP student) present during the examination and performed the breast and pelvic exam with me in attendance to confirm the exam findings   ASSESSMENT:  54 y.o. [id number] here for annual gynecologic exam  PLAN:   1. Postmenopausal.  Having intermittent hot flashes.  No vaginal bleeding.  Will monitor symptoms for now and we discussed black cohosh Estroven and soy isoflavones as potential OTC remedies.  She will consider HRT if the symptoms get worse. 2. Pap smear 2020.  History of CIN-1 in 1990, normal Pap smears since then.  Tracking with every other year Pap smears per her preference.  Next Pap smear due 2022. 3. History of leiomyoma.  No palpable abnormalities on her exam, recommend continued annual  follow-up. 4. Mammogram 11/2018.  Normal breast exam today.  She is reminded to schedule an annual mammogram this year when due. 5. Colonoscopy 2017.  Recommended that she follow up at the recommended interval.  6. Osteopenia.  DEXA 2017 T score -2.3.  Started on alendronate 2017 due to worsening osteopenia and daily prednisone.  Follow-up DEXA 2019 T score -2.0.  Will continue on alendronate for now.  Will call when she needs any refills.  Next DEXA recommended this year so she plans to schedule this. 7. Health maintenance.  No labs today as she normally has these completed elsewhere.  Return annually or sooner, prn.  Joseph Pierini MD  10/09/19  

## 2019-10-10 ENCOUNTER — Other Ambulatory Visit: Payer: Self-pay

## 2019-10-10 MED ORDER — ALENDRONATE SODIUM 70 MG PO TABS: 70.0000 mg | ORAL_TABLET | ORAL | 5 refills | Status: DC

## 2019-10-16 DIAGNOSIS — M25562 Pain in left knee: Secondary | ICD-10-CM | POA: Diagnosis not present

## 2019-10-16 DIAGNOSIS — M238X2 Other internal derangements of left knee: Secondary | ICD-10-CM | POA: Diagnosis not present

## 2019-10-16 DIAGNOSIS — M170 Bilateral primary osteoarthritis of knee: Secondary | ICD-10-CM | POA: Diagnosis not present

## 2019-10-16 DIAGNOSIS — M1712 Unilateral primary osteoarthritis, left knee: Secondary | ICD-10-CM | POA: Diagnosis not present

## 2019-10-16 DIAGNOSIS — M1711 Unilateral primary osteoarthritis, right knee: Secondary | ICD-10-CM | POA: Diagnosis not present

## 2019-10-16 DIAGNOSIS — M2392 Unspecified internal derangement of left knee: Secondary | ICD-10-CM | POA: Diagnosis not present

## 2019-10-16 DIAGNOSIS — M25561 Pain in right knee: Secondary | ICD-10-CM | POA: Diagnosis not present

## 2019-10-19 ENCOUNTER — Ambulatory Visit (INDEPENDENT_AMBULATORY_CARE_PROVIDER_SITE_OTHER): Payer: 59 | Admitting: Neurology

## 2019-10-19 DIAGNOSIS — R519 Headache, unspecified: Secondary | ICD-10-CM | POA: Diagnosis not present

## 2019-10-19 DIAGNOSIS — G4733 Obstructive sleep apnea (adult) (pediatric): Secondary | ICD-10-CM

## 2019-10-19 DIAGNOSIS — R001 Bradycardia, unspecified: Secondary | ICD-10-CM

## 2019-10-19 DIAGNOSIS — M329 Systemic lupus erythematosus, unspecified: Secondary | ICD-10-CM

## 2019-10-19 DIAGNOSIS — G253 Myoclonus: Secondary | ICD-10-CM

## 2019-10-19 DIAGNOSIS — M3214 Glomerular disease in systemic lupus erythematosus: Secondary | ICD-10-CM

## 2019-10-19 DIAGNOSIS — G43101 Migraine with aura, not intractable, with status migrainosus: Secondary | ICD-10-CM

## 2019-10-21 ENCOUNTER — Encounter: Payer: Self-pay | Admitting: Family Medicine

## 2019-10-23 ENCOUNTER — Telehealth: Payer: Self-pay | Admitting: Family Medicine

## 2019-10-23 MED ORDER — GABAPENTIN 600 MG PO TABS: ORAL_TABLET | ORAL | 0 refills | Status: DC

## 2019-10-23 NOTE — Telephone Encounter (Signed)
Pt request refill gabapentin (NEURONTIN) 600 MG tablet at [facility] [phone]. Pt stated, nurse told me I had to schedule an appt before I could get a refill for gabapentin (NEURONTIN) 600 MG tablet.

## 2019-10-23 NOTE — Telephone Encounter (Signed)
Refilled gabapentin x 3 months with note to pharmacy: must keep Oct FU.

## 2019-10-30 DIAGNOSIS — M1712 Unilateral primary osteoarthritis, left knee: Secondary | ICD-10-CM | POA: Diagnosis not present

## 2019-10-31 ENCOUNTER — Ambulatory Visit (INDEPENDENT_AMBULATORY_CARE_PROVIDER_SITE_OTHER): Payer: 59 | Admitting: Podiatry

## 2019-10-31 ENCOUNTER — Other Ambulatory Visit: Payer: Self-pay

## 2019-10-31 DIAGNOSIS — M722 Plantar fascial fibromatosis: Secondary | ICD-10-CM

## 2019-10-31 DIAGNOSIS — B353 Tinea pedis: Secondary | ICD-10-CM | POA: Diagnosis not present

## 2019-10-31 MED ORDER — CLOTRIMAZOLE-BETAMETHASONE 1-0.05 % EX CREA: 1.0000 "application " | TOPICAL_CREAM | Freq: Two times a day (BID) | CUTANEOUS | 0 refills | Status: DC

## 2019-11-02 ENCOUNTER — Other Ambulatory Visit: Payer: Self-pay | Admitting: Cardiovascular Disease

## 2019-11-02 ENCOUNTER — Encounter: Payer: Self-pay | Admitting: Podiatry

## 2019-11-02 NOTE — Progress Notes (Signed)
Subjective:  Patient ID: Marie Peterson, female    DOB: [birth year],  MRN: [id number]  Chief Complaint  Patient presents with  . Foot Pain    pt is here for possible plantar fasciitis of the left foot.    54 y.o. female presents with the above complaint.  Patient presents with recurrence of plantar fasciitis to the left side.  Patient states that it started to get worse.  Patient states is painful when walking on it.  The orthotics are helping but she states that she walked on uneven surfaces without orthotics which she had could have led to reaggravation of the plantar fasciitis.  She states her pain scale is 8 out of 10.  Patient has been wearing night splint but has not helped.  She also has secondary, right foot athlete's foot which is itching and would like to discuss treatment options for it.  She denies any other acute complaints.   Review of Systems: Negative except as noted in the HPI. Denies N/V/F/Ch.  Past Medical History:  Diagnosis Date  . Arthritis   . Atrophic vaginitis   . Atypical chest pain    thought to be GERD; myoview 05/17/07-no significant ischemia; echo 07/20/11- normal EF=>55%  . Baker's cyst 03/28/12   venous doppler 03/28/12=no thrombus; baker's cyst at left popliteal fossa  . Chest pain   . CIN I (cervical intraepithelial neoplasia I) 1990  . Degenerative disc disease, cervical 2020   severe, C3-4, 4-5, 5-6. Currently receiving injections by Dr. Vira Blanco of preferred pain management  . Fibroid   . Fibromyalgia   . Hypertension   . Lupus nephritis ([place])   . Migraines   . Neuropathy   . Osteopenia 09/2017   T score -2.0 improved from prior study  . Premature menopause age 48   following chemotherapy for Lupus  . Shingles     Current Outpatient Medications:  .  AJOVY 225 MG/1.5ML SOSY, INJECT 675MG  UNDER THE SKIN EVERY 3 MONTHS, Disp: 4.5 mL, Rfl: 11 .  alendronate (FOSAMAX) 70 MG tablet, TAKE 1 TABLET BY MOUTH WITH A FULL GLASS OF WATER ON AN EMPTY STOMACH  EVERY WEEK, Disp: 4 tablet, Rfl: 12 .  alendronate (FOSAMAX) 70 MG tablet, Take 1 tablet (70 mg total) by mouth every 7 (seven) days. Take with a full glass of water on an empty stomach., Disp: 4 tablet, Rfl: 5 .  amLODipine (NORVASC) 2.5 MG tablet, Take 2.5 mg by mouth daily., Disp: , Rfl:  .  amLODipine (NORVASC) 5 MG tablet, Take 1 tablet (5 mg total) by mouth daily., Disp: 90 tablet, Rfl: 3 .  aspirin 81 MG tablet, Take 81 mg by mouth daily., Disp: , Rfl:  .  BELSOMRA 10 MG TABS, 20 mg. , Disp: , Rfl: 0 .  benazepril (LOTENSIN) 40 MG tablet, Take 1 tablet by mouth daily., Disp: , Rfl: 1 .  benzonatate (TESSALON) 100 MG capsule, Take 100 mg by mouth 3 (three) times daily., Disp: , Rfl:  .  Cholecalciferol (VITAMIN D PO), Take 2,000 Int'l Units by mouth daily. , Disp: , Rfl:  .  clotrimazole-betamethasone (LOTRISONE) cream, Apply 1 application topically 2 (two) times daily., Disp: 30 g, Rfl: 0 .  clotrimazole-betamethasone (LOTRISONE) cream, Apply 1 application topically 2 (two) times daily., Disp: 30 g, Rfl: 0 .  diclofenac sodium (VOLTAREN) 1 % GEL, Apply topically., Disp: , Rfl:  .  FERREX 150 150 MG capsule, Take 1 tablet by mouth daily., Disp: , Rfl: 1 .  folic acid (FOLVITE) 470 MCG tablet, Take 400 mcg by mouth daily., Disp: , Rfl:  .  furosemide (LASIX) 40 MG tablet, Take 1 tablet by mouth 2 (two) times daily. TAKE 1 AND 1/2 TABLET IN THE MORNING AND 1 TABLET AT NIGHT., Disp: , Rfl: 0 .  gabapentin (NEURONTIN) 600 MG tablet, Take 600mg  in am, 600mg  at lunch and 1200mg  at bedtime, Disp: 270 tablet, Rfl: 0 .  hydrALAZINE (APRESOLINE) 10 MG tablet, Take 10 mg by mouth 2 (two) times daily., Disp: , Rfl:  .  hydroxychloroquine (PLAQUENIL) 200 MG tablet, Take 200 mg by mouth 2 (two) times daily. , Disp: , Rfl: 0 .  L-Methylfolate-B6-B12 (METANX PO), Take 1 tablet by mouth 2 (two) times daily. , Disp: , Rfl:  .  lidocaine (LIDODERM) 5 %, Place 3 patches onto the skin daily. Remove & Discard  patch within 12 hours or as directed by MD (Patient taking differently: Place 2 patches onto the skin daily. Remove & Discard patch within 12 hours or as directed by MD For use as needed), Disp: 90 patch, Rfl: 6 .  LORazepam (ATIVAN) 0.5 MG tablet, Take 0.5 mg by mouth daily as needed., Disp: , Rfl:  .  MAGNESIUM-OXIDE 400 (241.3 Mg) MG tablet, TAKE 1 TABLET BY MOUTH EVERY DAY**PLEASE SCHEDULE APPOINTMENT FOR REFILLS**, Disp: 90 tablet, Rfl: 0 .  Multiple Vitamin (MULTIVITAMIN) tablet, Take 1 tablet by mouth daily., Disp: , Rfl:  .  nabumetone (RELAFEN) 500 MG tablet, Take 500 mg by mouth 2 (two) times daily as needed., Disp: , Rfl: 0 .  Omega-3 Fatty Acids (FISH OIL) 1000 MG CAPS, Take 2 capsules by mouth daily., Disp: , Rfl:  .  omeprazole (PRILOSEC) 40 MG capsule, Take 40 mg by mouth daily., Disp: , Rfl: 1 .  ondansetron (ZOFRAN-ODT) 4 MG disintegrating tablet, DISSOLVE 1 TABLET ON THE TONGUE EVERY 8 HOURS AS NEEDED FOR NAUSEA OR VOMITING, Disp: 20 tablet, Rfl: 11 .  oxyCODONE-acetaminophen (PERCOCET) 7.5-325 MG tablet, Take 1 tablet by mouth every 8 (eight) hours., Disp: , Rfl:  .  phentermine (ADIPEX-P) 37.5 MG tablet, Take 37.5 mg by mouth daily., Disp: , Rfl:  .  potassium chloride SA (KLOR-CON) 20 MEQ tablet, TAKE 1 TABLET(20 MEQ) BY MOUTH DAILY, Disp: 90 tablet, Rfl: 3 .  predniSONE (DELTASONE) 5 MG tablet, Take 5 mg by mouth daily., Disp: , Rfl:  .  Rimegepant Sulfate (NURTEC) 75 MG TBDP, Take 75 mg by mouth daily as needed (take for abortive therapy of migraine, no more than 1 tablet in 24 hours or 10 per month)., Disp: 10 tablet, Rfl: 11 .  sertraline (ZOLOFT) 100 MG tablet, sertraline 100 mg tablet  TAKE 1 TABLET BY MOUTH EVERY DAY, Disp: , Rfl:  .  tolterodine (DETROL LA) 4 MG 24 hr capsule, Take 4 mg by mouth daily., Disp: , Rfl:  .  venlafaxine XR (EFFEXOR-XR) 37.5 MG 24 hr capsule, Take 37.5 mg by mouth daily. , Disp: , Rfl:  .  XIIDRA 5 % SOLN, Place 1 drop into both eyes 2 (two)  times daily., Disp: , Rfl:   Social History   Tobacco Use  Smoking Status Never Smoker  Smokeless Tobacco Never Used    Allergies  Allergen Reactions  . Ubrogepant Swelling    Lip swelling   . Cymbalta [Duloxetine Hcl]     Lip swelling  . Myrbetriq [Mirabegron] Itching   Objective:  There were no vitals filed for this visit. There is no height  or weight on file to calculate BMI. Constitutional Well developed. Well nourished.  Vascular Dorsalis pedis pulses palpable bilaterally. Posterior tibial pulses palpable bilaterally. Capillary refill normal to all digits.  No cyanosis or clubbing noted. Pedal hair growth normal.  Neurologic Normal speech. Oriented to person, place, and time. Epicritic sensation to light touch grossly present bilaterally.  Dermatologic Nails well groomed and normal in appearance. No open wounds. Epidermolytic scaly patches noted to the right lower extremity consistent with athlete's foot subjective findings of itching noted.  Orthopedic: Normal joint ROM without pain or crepitus bilaterally. No visible deformities. Tender to palpation at the calcaneal tuber left. No pain with calcaneal squeeze left. Ankle ROM diminished range of motion left. Silfverskiold Test: positive left.   Radiographs: None  Assessment:  No diagnosis found. Plan:  Patient was evaluated and treated and all questions answered.  Athlete's foot right -I explained patient the etiology of athlete's foot and various treatment options were extensively discussed with the patient.  Given that this is mild to moderate in nature I believe she will benefit from over-the-counter cream to help decrease the fungal growth and therefore itching associated.  Lotrisone cream was sent to the pharmacy.  Patient will apply twice a day.  Plantar Fasciitis, left recurrence - XR reviewed as above.  - Educated on icing and stretching. Instructions given.  - Injection delivered to the plantar  fascia as below. - DME: Place her self back in plantar Fascial Brace - Pharmacologic management: None  Procedure: Injection Tendon/Ligament Location: Left plantar fascia at the glabrous junction; medial approach. Skin Prep: alcohol Injectate: 0.5 cc 0.5% marcaine plain, 0.5 cc of 1% Lidocaine, 0.5 cc kenalog 10. Disposition: Patient tolerated procedure well. Injection site dressed with a band-aid.  No follow-ups on file.

## 2019-11-04 ENCOUNTER — Ambulatory Visit (INDEPENDENT_AMBULATORY_CARE_PROVIDER_SITE_OTHER): Payer: 59

## 2019-11-04 ENCOUNTER — Telehealth: Payer: Self-pay | Admitting: Neurology

## 2019-11-04 ENCOUNTER — Other Ambulatory Visit: Payer: Self-pay | Admitting: Obstetrics and Gynecology

## 2019-11-04 ENCOUNTER — Other Ambulatory Visit: Payer: Self-pay

## 2019-11-04 DIAGNOSIS — Z01419 Encounter for gynecological examination (general) (routine) without abnormal findings: Secondary | ICD-10-CM

## 2019-11-04 DIAGNOSIS — M8589 Other specified disorders of bone density and structure, multiple sites: Secondary | ICD-10-CM

## 2019-11-04 DIAGNOSIS — Z780 Asymptomatic menopausal state: Secondary | ICD-10-CM | POA: Diagnosis not present

## 2019-11-04 DIAGNOSIS — M8580 Other specified disorders of bone density and structure, unspecified site: Secondary | ICD-10-CM

## 2019-11-04 NOTE — Telephone Encounter (Signed)
Called the patient and reviewed the sleep study in detail with her reviewing there was no organic sleep disorder that was found to be present. Advised the patient that she would recommend making sure establishing good sleep hygiene habits. Patient was encouraged to make [place] aware if the feels like she still has concerns in which case Dr Brett Fairy would recommend cognitive behavior therapy. Patient states that she was just wanted to confirm there was no apnea or heart issues present and was happy report was negative of that. Pt verbalized understanding. Pt had no questions at this time but was encouraged to call back if questions arise.

## 2019-11-04 NOTE — Progress Notes (Signed)
Time spent below 89% saturation equaled 28 minutes.  The arousals were noted as: 46 were spontaneous, 0 were associated with PLMs, 0 were associated with respiratory events. No snoring was heart.  The patient had a total of 0 Periodic Limb Movements.   EKG with isolated PACs.   IMPRESSION:   1. No evidence of Obstructive Sleep Apnea (OSA) 2. No evidence of Periodic Limb Movement Disorder (PLMD) 3. Non-specific abnormal EKG.    RECOMMENDATIONS:  Frequent spontaneous arousals were unrelated to physiological causes. Sleep habits, such as a late bedtime, or discomfort related arousals can play a role. Implementing good sleep habits and addressing pain/ discomfort or anxiety, all can help improve sleep time and quality.  PCP : Please consider cognitive behavior therapy.

## 2019-11-04 NOTE — Telephone Encounter (Signed)
-----   Message from Larey Seat, MD sent at 11/04/2019 12:04 PM EDT ----- Time spent below 89% saturation equaled 28 minutes.  The arousals were noted as: 46 were spontaneous, 0 were associated with PLMs, 0 were associated with respiratory events. No snoring was heart.  The patient had a total of 0 Periodic Limb Movements.   EKG with isolated PACs.   IMPRESSION:   1. No evidence of Obstructive Sleep Apnea (OSA) 2. No evidence of Periodic Limb Movement Disorder (PLMD) 3. Non-specific abnormal EKG.    RECOMMENDATIONS:  Frequent spontaneous arousals were unrelated to physiological causes. Sleep habits, such as a late bedtime, or discomfort related arousals can play a role. Implementing good sleep habits and addressing pain/ discomfort or anxiety, all can help improve sleep time and quality.  PCP : Please consider cognitive behavior therapy.

## 2019-11-04 NOTE — Procedures (Signed)
PATIENT'S NAME:  Marie Peterson, Marie Peterson DOB:      1965-11-15      MR#:    [id number]     DATE OF RECORDING: 10/19/2019 AL  REFERRING M.D.:  [name] MD Study Performed:   Baseline Polysomnogram HISTORY:  This 54 year-old  Marie Peterson female patient is established with Dr. Jaynee Eagles and seen here upon a referral on 09/11/2019 from Dr Maudie Mercury for sleep disordered breathing.  Chief concern according to patient :   Marie Peterson reports that while her daughter has slept at her house, she had noticed her to twitch and move during her sleep -may be more of a "trembling".  She also noted irregular breathing and pauses in her sleep breathing.  She is referred for evaluation of possible sleep apnea.  Dr. [name] referred.   I reviewed the patient's medication she is on Percocet, she is still on oxycodone.  She has a medical history of Lupus, Arthritis, Atypical chest pain, Baker's cyst (03/28/12), Chest pain, CIN I (1990), Degenerative disc disease, cervical (2020), Hypertension, Lupus nephritis ([place]), Migraines, Osteopenia (09/2017), Premature menopause (age 36), and Shingles.    Sleep relevant medical history: Nocturia- diuretic induced 3-4 times, No Sleep walking, sinus surgery.  Family medical /sleep history: niece is another family member on CPAP with OSA.   The patient endorsed the Epworth Sleepiness Scale at 8 points.   The patient's weight 181 pounds with a height of 64 (inches), resulting in a BMI of 30.9 kg/m2. The patient's neck circumference measured 15 inches.  CURRENT MEDICATIONS: Ajovy, Fosamax, Norvasc, Aspirin, Belsomra, Lotensin, Tessalon, Lotrisone, Voltaren, Folvite, Lasix, Neurontin, Apresoline, Plaquenil, Metanx, Lidoderm, Ativan, Relafen, Maxitrol, Prilosec, Percocet, Deltasone, Nurtec, Zoloft, Detrol, Effexor   PROCEDURE:  This is a multichannel digital polysomnogram utilizing the Somnostar 11.2 system.  Electrodes and sensors were applied and monitored per AASM Specifications.   EEG, EOG, Chin and  Limb EMG, were sampled at 200 Hz.  ECG, Snore and Nasal Pressure, Thermal Airflow, Respiratory Effort, CPAP Flow and Pressure, Oximetry was sampled at 50 Hz. Digital video and audio were recorded.      BASELINE STUDY: Lights Out was at 21:34 and Lights On at 05:01.  Total recording time (TRT) was 447 minutes, with a total sleep time (TST) of 247 minutes.   The patient's sleep latency was 210.5 minutes, extremely prolonged.  REM latency was 236 minutes.  The sleep efficiency was 55.3 %.     SLEEP ARCHITECTURE: WASO (Wake after sleep onset) was 161.5 minutes.  There were 13 minutes in Stage N1, 67 minutes Stage N2, 151.5 minutes Stage N3 and 15.5 minutes in Stage REM.  The percentage of Stage N1 was 5.3%, Stage N2 was 27.1%, Stage N3 was 61.3% and Stage R (REM sleep) was 6.3%.     RESPIRATORY ANALYSIS:  There were a total of 2 respiratory events:  0 obstructive apneas, 0 central apneas and 0 mixed apneas with a total of 0 apneas and an apnea index (AI) of 0 /hour. There were 2 hypopneas with a hypopnea index of .5 /hour.  The total APNEA/HYPOPNEA INDEX (AHI) was 0.5/hour.  0 events occurred in REM sleep and 4 events in NREM. The REM AHI was  0 /hour, versus a non-REM AHI of 0.5. The patient spent 206.5 minutes of total sleep time in the supine position and 41 minutes in non-supine. The supine AHI was 0.6 versus a non-supine AHI of 0.0.  OXYGEN SATURATION & C02:  The Wake baseline 02 saturation was  96%, with the lowest being 86%. Time spent below 89% saturation equaled 28 minutes.  The arousals were noted as: 46 were spontaneous, 0 were associated with PLMs, 0 were associated with respiratory events. No snoring was heart.  The patient had a total of 0 Periodic Limb Movements.   EKG with isolated PACs.   IMPRESSION:   1. No evidence of Obstructive Sleep Apnea (OSA) 2. No evidence of Periodic Limb Movement Disorder (PLMD) 3. Non-specific abnormal EKG.    RECOMMENDATIONS:  Frequent spontaneous  arousals were unrelated to physiological causes. Sleep habits, such as a late bedtime, or discomfort related arousals can play a role. Implementing good sleep habits and addressing pain/ discomfort or anxiety, all can help improve sleep time and quality.  PCP : Please consider cognitive behavior therapy.   I certify that I have reviewed the entire raw data recording prior to the issuance of this report in accordance with the Standards of Accreditation of the [place] [employer] (AASM)    Larey Seat, MD Diplomat, [place] Board of [employer]iat[employer] Neurology  Diplomat, [place] [employer] Market researcher, [facility] [employer]

## 2019-11-05 DIAGNOSIS — M2242 Chondromalacia patellae, left knee: Secondary | ICD-10-CM | POA: Diagnosis not present

## 2019-11-05 DIAGNOSIS — M25561 Pain in right knee: Secondary | ICD-10-CM | POA: Diagnosis not present

## 2019-11-11 ENCOUNTER — Other Ambulatory Visit: Payer: Self-pay

## 2019-11-11 ENCOUNTER — Ambulatory Visit (INDEPENDENT_AMBULATORY_CARE_PROVIDER_SITE_OTHER): Payer: 59 | Admitting: Pharmacist

## 2019-11-11 VITALS — BP 132/76 | HR 63 | Resp 15 | Ht 64.0 in | Wt 180.4 lb

## 2019-11-11 DIAGNOSIS — I10 Essential (primary) hypertension: Secondary | ICD-10-CM | POA: Diagnosis not present

## 2019-11-11 MED ORDER — AMLODIPINE BESY-BENAZEPRIL HCL 5-40 MG PO CAPS: 1.0000 | ORAL_CAPSULE | Freq: Every day | ORAL | 1 refills | Status: DC

## 2019-11-11 MED ORDER — CHLORTHALIDONE 25 MG PO TABS: 12.5000 mg | ORAL_TABLET | Freq: Every day | ORAL | 1 refills | Status: DC

## 2019-11-11 NOTE — Progress Notes (Signed)
HPI:  RASCHELLE WISENBAKER is a 54 y.o. female patient of Dr Gwenlyn Found,  who presents today for hypertension follow up.  In addition to hypertension, her medial history is significant for migraine headaches and SLE.    She notes that she has had migraines for many years, as well as arthritis and fibromyalgia.  Confirmed medication list, including that she takes nabumetone 500 mg twice daily on a regular basis.  No GI issues from long-term use at this time.  Reports compliance with current therapy and denies dizziness, swelling, chest pain or blurry vision.  Blood Pressure Goal:  130/80  Current Medications:  benazepril 40 mg daily  amlodipine 5 mg daily  hydralazine 10 mg twice daily  Family Hx: both parents with hypertension; father died earlier this year at 31 from cancer; mother living at 42, 2 children 64, 56 w/o issues, siblings also healthy.    Social Hx: no tobacco, no alcohol; 1 cup coffee in the mornings  Diet:  Mostly home cooked, with occasional take out; some added salt not often; mix of f/v (moslty fresh, occasional frozen); protein mainly chicken, lean ground beef, fish;   Exercise: no regular exercise; some PT for finger injury  Home BP readings:  15 readings, average 130/83, HR range 50-59bpm  Intolerances: no cardiac medication intolerances  Labs:  11/2018:  Na 144, K 4.2, Glu 79,BUN 16,SCr 0.84  Wt Readings from Last 3 Encounters:  11/11/19 180 lb 6.4 oz (81.8 kg)  10/09/19 184 lb (83.5 kg)  09/30/19 180 lb 12.8 oz (82 kg)   BP Readings from Last 3 Encounters:  11/11/19 132/76  10/09/19 122/80  09/30/19 140/88   Pulse Readings from Last 3 Encounters:  11/11/19 63  09/30/19 (!) 54  09/11/19 (!) 57    Current Outpatient Medications  Medication Sig Dispense Refill   AJOVY 225 MG/1.5ML SOSY INJECT 675MG  UNDER THE SKIN EVERY 3 MONTHS 4.5 mL 11   alendronate (FOSAMAX) 70 MG tablet Take 1 tablet (70 mg total) by mouth every 7 (seven) days. Take with a full glass  of water on an empty stomach. 4 tablet 5   aspirin 81 MG tablet Take 81 mg by mouth daily.     benzonatate (TESSALON) 100 MG capsule Take 100 mg by mouth 3 (three) times daily.     Cholecalciferol (VITAMIN D PO) Take 2,000 Int'l Units by mouth daily.      clotrimazole-betamethasone (LOTRISONE) cream Apply 1 application topically 2 (two) times daily. 30 g 0   diclofenac sodium (VOLTAREN) 1 % GEL Apply topically.     FERREX 150 150 MG capsule Take 1 tablet by mouth daily.  1   folic acid (FOLVITE) 163 MCG tablet Take 400 mcg by mouth daily.     furosemide (LASIX) 40 MG tablet Take 1 tablet by mouth 2 (two) times daily. TAKE 1 AND 1/2 TABLET IN THE MORNING AND 1 TABLET AT NIGHT.  0   gabapentin (NEURONTIN) 600 MG tablet Take 600mg  in am, 600mg  at lunch and 1200mg  at bedtime 270 tablet 0   hydrALAZINE (APRESOLINE) 10 MG tablet Take 10 mg by mouth 2 (two) times daily.     hydroxychloroquine (PLAQUENIL) 200 MG tablet Take 200 mg by mouth 2 (two) times daily.   0   L-Methylfolate-B6-B12 (METANX PO) Take 1 tablet by mouth 2 (two) times daily.      lidocaine (LIDODERM) 5 % Place 3 patches onto the skin daily. Remove & Discard patch within 12  hours or as directed by MD (Patient taking differently: Place 2 patches onto the skin daily. Remove & Discard patch within 12 hours or as directed by MD For use as needed) 90 patch 6   LORazepam (ATIVAN) 0.5 MG tablet Take 0.5 mg by mouth daily as needed.     magnesium oxide (MAGNESIUM-OXIDE) 400 (241.3 Mg) MG tablet Take 1 tablet (400 mg total) by mouth daily. 90 tablet 3   Multiple Vitamin (MULTIVITAMIN) tablet Take 1 tablet by mouth daily.     nabumetone (RELAFEN) 500 MG tablet Take 500 mg by mouth 2 (two) times daily as needed.  0   Omega-3 Fatty Acids (FISH OIL) 1000 MG CAPS Take 2 capsules by mouth daily.     omeprazole (PRILOSEC) 40 MG capsule Take 40 mg by mouth daily.  1   ondansetron (ZOFRAN-ODT) 4 MG disintegrating tablet DISSOLVE 1  TABLET ON THE TONGUE EVERY 8 HOURS AS NEEDED FOR NAUSEA OR VOMITING 20 tablet 11   oxyCODONE-acetaminophen (PERCOCET) 7.5-325 MG tablet Take 1 tablet by mouth every 8 (eight) hours.     phentermine (ADIPEX-P) 37.5 MG tablet Take 37.5 mg by mouth daily.     potassium chloride SA (KLOR-CON) 20 MEQ tablet TAKE 1 TABLET(20 MEQ) BY MOUTH DAILY 90 tablet 3   predniSONE (DELTASONE) 5 MG tablet Take 5 mg by mouth daily.     Rimegepant Sulfate (NURTEC) 75 MG TBDP Take 75 mg by mouth daily as needed (take for abortive therapy of migraine, no more than 1 tablet in 24 hours or 10 per month). 10 tablet 11   sertraline (ZOLOFT) 100 MG tablet sertraline 100 mg tablet  TAKE 1 TABLET BY MOUTH EVERY DAY     Suvorexant (BELSOMRA) 20 MG TABS Take 20 mg by mouth Nightly. Take one tablet at bedtime daily     tolterodine (DETROL LA) 4 MG 24 hr capsule Take 4 mg by mouth daily.     venlafaxine XR (EFFEXOR-XR) 37.5 MG 24 hr capsule Take 37.5 mg by mouth daily.      XIIDRA 5 % SOLN Place 1 drop into both eyes 2 (two) times daily.     amLODipine-benazepril (LOTREL) 5-40 MG capsule Take 1 capsule by mouth at bedtime. 90 capsule 1   chlorthalidone (HYGROTON) 25 MG tablet Take 0.5 tablets (12.5 mg total) by mouth daily. 15 tablet 1   No current facility-administered medications for this visit.    Allergies  Allergen Reactions   Ubrogepant Swelling    Lip swelling    Cymbalta [Duloxetine Hcl]     Lip swelling   Myrbetriq [Mirabegron] Itching    Past Medical History:  Diagnosis Date   Arthritis    Atrophic vaginitis    Atypical chest pain    thought to be GERD; myoview 05/17/07-no significant ischemia; echo 07/20/11- normal EF=>55%   Baker's cyst 03/28/12   venous doppler 03/28/12=no thrombus; baker's cyst at left popliteal fossa   Chest pain    CIN I (cervical intraepithelial neoplasia I) 1990   Degenerative disc disease, cervical 2020   severe, C3-4, 4-5, 5-6. Currently receiving injections  by Dr. Vira Blanco of preferred pain management   Fibroid    Fibromyalgia    Hypertension    Lupus nephritis ([place])    Migraines    Neuropathy    Osteopenia 09/2017   T score -2.0 improved from prior study   Premature menopause age 30   following chemotherapy for Lupus   Shingles     Blood pressure  132/76, pulse 63, resp. rate 15, height 5\' 4"  (1.626 m), weight 180 lb 6.4 oz (81.8 kg), SpO2 97 %.  Essential hypertension Blood pressure remains slightly above goal and patient continues to tolerate current therapy without adverse side effect reported. Will change benazepril and amlodipine to a combined Lotrel capsule (amlodipine 5mg /benazepril 40mg ) 1 capsule daily, and add chlorthalidone 12.5mg  every morning to therapy. Will continue hydralazine 10mg  twice daily and repeat BMEt in 2 weeks.  PLn to increase chlorthalidone to 25mg  daily during next office visit if renal function remains stable and additional BP control needed.    Andric Kerce Rodriguez-Guzman PharmD, BCPS, [place] [address] [place] [address] 11/12/2019 1:34 PM

## 2019-11-11 NOTE — Patient Instructions (Addendum)
Return for a  follow up appointment in 4 weeks  Go to the lab in 2 weeks  Check your blood pressure at home daily (if able) and keep record of the readings.  Take your BP meds as follows:  *STOP taking amlodipine 5mg  *STOP taking benazepril 40mg  *START taking amlodipine/benazepril 5mg /40mg  at bedtime* *START taking chlorthalidone 12.5mg  (1/2 tablet) every morning* *CONTINUE taking hydralazine 10mg  twice daily*  Bring your record of home blood pressures to your next appointment.  Exercise as you're able, try to walk approximately 30 minutes per day.  Keep salt intake to a minimum, especially watch canned and prepared boxed foods.  Eat more fresh fruits and vegetables and fewer canned items.  Avoid eating in fast food restaurants.    HOW TO TAKE YOUR BLOOD PRESSURE: . Rest 5 minutes before taking your blood pressure. .  Don't smoke or drink caffeinated beverages for at least 30 minutes before. . Take your blood pressure before (not after) you eat. . Sit comfortably with your back supported and both feet on the floor (don't cross your legs). . Elevate your arm to heart level on a table or a desk. . Use the proper sized cuff. It should fit smoothly and snugly around your bare upper arm. There should be enough room to slip a fingertip under the cuff. The bottom edge of the cuff should be 1 inch above the crease of the elbow. . Ideally, take 3 measurements at one sitting and record the average.

## 2019-11-12 ENCOUNTER — Encounter: Payer: Self-pay | Admitting: Pharmacist

## 2019-11-12 NOTE — Assessment & Plan Note (Signed)
Blood pressure remains slightly above goal and patient continues to tolerate current therapy without adverse side effect reported. Will change benazepril and amlodipine to a combined Lotrel capsule (amlodipine 5mg /benazepril 40mg ) 1 capsule daily, and add chlorthalidone 12.5mg  every morning to therapy. Will continue hydralazine 10mg  twice daily and repeat BMEt in 2 weeks.  PLn to increase chlorthalidone to 25mg  daily during next office visit if renal function remains stable and additional BP control needed.

## 2019-11-18 ENCOUNTER — Ambulatory Visit (INDEPENDENT_AMBULATORY_CARE_PROVIDER_SITE_OTHER): Payer: 59 | Admitting: *Deleted

## 2019-11-18 ENCOUNTER — Encounter: Payer: Self-pay | Admitting: *Deleted

## 2019-11-18 DIAGNOSIS — G43009 Migraine without aura, not intractable, without status migrainosus: Secondary | ICD-10-CM

## 2019-11-18 DIAGNOSIS — Z741 Need for assistance with personal care: Secondary | ICD-10-CM

## 2019-11-18 NOTE — Progress Notes (Signed)
Patientwasgiven her 3 scheduled injections of Ajovy 225 mg each. Total 675 mg. She brought these from home. They were administered inLUQofabdomen. Pt tolerated wellandaseptic technique used. Next injectionsscheduled for11/24/21 @ 8:15 AM.  PEA:[id number], PBA:[id number]

## 2019-11-25 LAB — BASIC METABOLIC PANEL
BUN/Creatinine Ratio: 22 (ref 9–23)
BUN: 20 mg/dL (ref 6–24)
CO2: 27 mmol/L (ref 20–29)
Calcium: 10.1 mg/dL (ref 8.7–10.2)
Chloride: 98 mmol/L (ref 96–106)
Creatinine, Ser: 0.89 mg/dL (ref 0.57–1.00)
GFR calc Af Amer: 84 mL/min/{1.73_m2} (ref >59)
GFR calc non Af Amer: 73 mL/min/{1.73_m2} (ref >59)
Glucose: 68 mg/dL (ref 65–99)
Potassium: 3.8 mmol/L (ref 3.5–5.2)
Sodium: 140 mmol/L (ref 134–144)

## 2019-11-28 ENCOUNTER — Ambulatory Visit: Payer: 59 | Admitting: Podiatry

## 2019-12-02 ENCOUNTER — Telehealth: Payer: Self-pay | Admitting: Cardiovascular Disease

## 2019-12-02 NOTE — Telephone Encounter (Signed)
Spoke to patient she stated pharmacist prescribed Chorthalidone.She started having swelling in lips this past Saturday..Advised to stop taking.I will send message to pharmacist for advice.

## 2019-12-02 NOTE — Telephone Encounter (Signed)
Please, stop taking medication. Add to allergy list. We will discuss alternative during follow up in 1 week

## 2019-12-02 NOTE — Telephone Encounter (Signed)
Spoke to patient Marie Peterson's advice given.Advised to keep appointment scheduled with pharmacy 9/14 at 9:00 am.

## 2019-12-02 NOTE — Telephone Encounter (Signed)
    Pt c/o medication issue:  1. Name of Medication: chlorthalidone (HYGROTON) 25 MG tablet  2. How are you currently taking this medication (dosage and times per day)? Take 0.5 tablets (12.5 mg total) by mouth daily.  3. Are you having a reaction (difficulty breathing--STAT)?   4. What is your medication issue? Pt said she might having allergic reaction with this medication, she said when she took it her lips started to swell up

## 2019-12-09 ENCOUNTER — Other Ambulatory Visit: Payer: Self-pay

## 2019-12-09 ENCOUNTER — Ambulatory Visit (INDEPENDENT_AMBULATORY_CARE_PROVIDER_SITE_OTHER): Payer: 59 | Admitting: Pharmacist Clinician (PhC)/ Clinical Pharmacy Specialist

## 2019-12-09 DIAGNOSIS — I10 Essential (primary) hypertension: Secondary | ICD-10-CM

## 2019-12-09 NOTE — Patient Instructions (Signed)
  Check your blood pressure at home 3-4 times each week and keep record of the readings.  Take your BP meds as follows:  Continue with all current medications.  If your diastolic blood pressure (bottom number) averages above 80 consistently, please give [place] a call.  For questions or concerns call Aliyha Fornes/Raquel at [phone]  Bring all of your meds, your BP cuff and your record of home blood pressures to your next appointment.  Exercise as you're able, try to walk approximately 30 minutes per day.  Keep salt intake to a minimum, especially watch canned and prepared boxed foods.  Eat more fresh fruits and vegetables and fewer canned items.  Avoid eating in fast food restaurants.    HOW TO TAKE YOUR BLOOD PRESSURE: . Rest 5 minutes before taking your blood pressure. .  Don't smoke or drink caffeinated beverages for at least 30 minutes before. . Take your blood pressure before (not after) you eat. . Sit comfortably with your back supported and both feet on the floor (don't cross your legs). . Elevate your arm to heart level on a table or a desk. . Use the proper sized cuff. It should fit smoothly and snugly around your bare upper arm. There should be enough room to slip a fingertip under the cuff. The bottom edge of the cuff should be 1 inch above the crease of the elbow. . Ideally, take 3 measurements at one sitting and record the average.

## 2019-12-09 NOTE — Assessment & Plan Note (Signed)
Patient with essential hypertension, currently well controlled on amlodipine/benazepril and hydralazine.  Patient to continue with current regimen and was asked to continue with regular home BP monitoring.  She should average her readings each week, and if diastolic readings trend > 80 she will need to contact the office for follow up.

## 2019-12-09 NOTE — Progress Notes (Signed)
HPI:  Marie Peterson is a 54 y.o. female patient of Dr Gwenlyn Found,  who presents today for hypertension follow up.  In addition to hypertension, her medial history is significant for migraine headaches and SLE.  At her initial CVRR visit it was noted that she has had migraines for many years, as well as arthritis and fibromyalgia.  Confirmed medication list, including that she takes nabumetone 500 mg twice daily on a regular basis.  No GI issues from long-term use at this time.  Reports compliance with current therapy and denies dizziness, swelling, chest pain or blurry vision.  For ease of use, her amlodipine and benazepril were combined into a single tablet at her last visit.  Chlorthalidone 12.5 mg was added,as she was not quite to BP goal.  Unfortunately this caused her to have some swelling around her mouth and lips and had to be discontinued.  She continued with the amlodipine/benazpril and hydralazine.    Today she returns for follow up.   She has been feeling well overall and has a list of home BP readings with her today.    At last visit combined amlodipine and benazepril to single tablet and added chlorthalidone 12.5 mg daily. Caused angioedema and cleare once sto[[ed  Blood Pressure Goal:  130/80  Current Medications: Amlodipine/benazepril 5/40 qd hydralazine 10 mg twice daily  Family Hx: both parents with hypertension; father died earlier this year at 39 from cancer; mother living at 14, 2 children 36, 68 w/o issues, siblings also healthy.    Social Hx: no tobacco, no alcohol; 1 cup coffee in the mornings  Diet:  Mostly home cooked, with occasional take out; some added salt not often; mix of f/v (moslty fresh, occasional frozen); protein mainly chicken, lean ground beef, fish;   Exercise: no regular exercise; some PT for finger injury  Home BP readings:  21 readings, average 118/80,   HR range 49-61 bpm (previous appointment) 15 readings, average 130/83, HR range  50-59bpm  Intolerances: no cardiac medication intolerances  Labs:  10/2019:  Na 140, K 3.8, Glu 68, BUN 18, SCr 0.89  11/2018:  Na 144, K 4.2, Glu 79,BUN 16, SCr 0.84  Wt Readings from Last 3 Encounters:  12/09/19 179 lb 3.2 oz (81.3 kg)  11/11/19 180 lb 6.4 oz (81.8 kg)  10/09/19 184 lb (83.5 kg)   BP Readings from Last 3 Encounters:  12/09/19 124/80  11/11/19 132/76  10/09/19 122/80   Pulse Readings from Last 3 Encounters:  12/09/19 66  11/11/19 63  09/30/19 (!) 54    Current Outpatient Medications  Medication Sig Dispense Refill   AJOVY 225 MG/1.5ML SOSY INJECT 675MG  UNDER THE SKIN EVERY 3 MONTHS 4.5 mL 11   alendronate (FOSAMAX) 70 MG tablet Take 1 tablet (70 mg total) by mouth every 7 (seven) days. Take with a full glass of water on an empty stomach. 4 tablet 5   amLODipine-benazepril (LOTREL) 5-40 MG capsule Take 1 capsule by mouth at bedtime. 90 capsule 1   aspirin 81 MG tablet Take 81 mg by mouth daily.     benzonatate (TESSALON) 100 MG capsule Take 100 mg by mouth 3 (three) times daily.     Cholecalciferol (VITAMIN D PO) Take 2,000 Int'l Units by mouth daily.      clotrimazole-betamethasone (LOTRISONE) cream Apply 1 application topically 2 (two) times daily. 30 g 0   diclofenac sodium (VOLTAREN) 1 % GEL Apply topically.     FERREX 150 150 MG  capsule Take 1 tablet by mouth daily.  1   folic acid (FOLVITE) 323 MCG tablet Take 400 mcg by mouth daily.     furosemide (LASIX) 40 MG tablet Take 1 tablet by mouth 2 (two) times daily. TAKE 1 AND 1/2 TABLET IN THE MORNING AND 1 TABLET AT NIGHT.  0   gabapentin (NEURONTIN) 600 MG tablet Take 600mg  in am, 600mg  at lunch and 1200mg  at bedtime 270 tablet 0   hydrALAZINE (APRESOLINE) 10 MG tablet Take 10 mg by mouth 2 (two) times daily.     hydroxychloroquine (PLAQUENIL) 200 MG tablet Take 200 mg by mouth 2 (two) times daily.   0   L-Methylfolate-B6-B12 (METANX PO) Take 1 tablet by mouth 2 (two) times daily.       lidocaine (LIDODERM) 5 % Place 3 patches onto the skin daily. Remove & Discard patch within 12 hours or as directed by MD (Patient taking differently: Place 2 patches onto the skin daily. Remove & Discard patch within 12 hours or as directed by MD For use as needed) 90 patch 6   LORazepam (ATIVAN) 0.5 MG tablet Take 0.5 mg by mouth daily as needed.     magnesium oxide (MAGNESIUM-OXIDE) 400 (241.3 Mg) MG tablet Take 1 tablet (400 mg total) by mouth daily. 90 tablet 3   Multiple Vitamin (MULTIVITAMIN) tablet Take 1 tablet by mouth daily.     nabumetone (RELAFEN) 500 MG tablet Take 500 mg by mouth 2 (two) times daily as needed.  0   Omega-3 Fatty Acids (FISH OIL) 1000 MG CAPS Take 2 capsules by mouth daily.     omeprazole (PRILOSEC) 40 MG capsule Take 40 mg by mouth daily.  1   ondansetron (ZOFRAN-ODT) 4 MG disintegrating tablet DISSOLVE 1 TABLET ON THE TONGUE EVERY 8 HOURS AS NEEDED FOR NAUSEA OR VOMITING 20 tablet 11   oxyCODONE-acetaminophen (PERCOCET) 7.5-325 MG tablet Take 1 tablet by mouth every 8 (eight) hours.     phentermine (ADIPEX-P) 37.5 MG tablet Take 37.5 mg by mouth daily.     potassium chloride SA (KLOR-CON) 20 MEQ tablet TAKE 1 TABLET(20 MEQ) BY MOUTH DAILY 90 tablet 3   predniSONE (DELTASONE) 5 MG tablet Take 5 mg by mouth every 3 (three) days. Take one tablet every 3 days     Rimegepant Sulfate (NURTEC) 75 MG TBDP Take 75 mg by mouth daily as needed (take for abortive therapy of migraine, no more than 1 tablet in 24 hours or 10 per month). 10 tablet 11   sertraline (ZOLOFT) 100 MG tablet sertraline 100 mg tablet  TAKE 1 TABLET BY MOUTH EVERY DAY     Suvorexant (BELSOMRA) 20 MG TABS Take 20 mg by mouth Nightly. Take one tablet at bedtime daily     tolterodine (DETROL LA) 4 MG 24 hr capsule Take 4 mg by mouth daily.     venlafaxine XR (EFFEXOR-XR) 37.5 MG 24 hr capsule Take 37.5 mg by mouth daily.      XIIDRA 5 % SOLN Place 1 drop into both eyes 2 (two) times daily.      No current facility-administered medications for this visit.    Allergies  Allergen Reactions   Ubrogepant Swelling    Lip swelling    Cymbalta [Duloxetine Hcl]     Lip swelling   Hygroton [Chlorthalidone] Swelling    Swelling in lips   Myrbetriq [Mirabegron] Itching    Past Medical History:  Diagnosis Date   Arthritis    Atrophic vaginitis  Atypical chest pain    thought to be GERD; myoview 05/17/07-no significant ischemia; echo 07/20/11- normal EF=>55%   Baker's cyst 03/28/12   venous doppler 03/28/12=no thrombus; baker's cyst at left popliteal fossa   Chest pain    CIN I (cervical intraepithelial neoplasia I) 1990   Degenerative disc disease, cervical 2020   severe, C3-4, 4-5, 5-6. Currently receiving injections by Dr. Vira Blanco of preferred pain management   Fibroid    Fibromyalgia    Hypertension    Lupus nephritis ([place])    Migraines    Neuropathy    Osteopenia 09/2017   T score -2.0 improved from prior study   Premature menopause age 13   following chemotherapy for Lupus   Shingles     Blood pressure 124/80, pulse 66, resp. rate 16, height 5\' 4"  (1.626 m), weight 179 lb 3.2 oz (81.3 kg), SpO2 95 %.  Essential hypertension Patient with essential hypertension, currently well controlled on amlodipine/benazepril and hydralazine.  Patient to continue with current regimen and was asked to continue with regular home BP monitoring.  She should average her readings each week, and if diastolic readings trend > 80 she will need to contact the office for follow up.      Tommy Medal PharmD CPP [facility] [address] [place] [address] 12/09/2019 3:31 PM

## 2019-12-12 ENCOUNTER — Other Ambulatory Visit: Payer: Self-pay

## 2019-12-12 ENCOUNTER — Encounter: Payer: Self-pay | Admitting: Podiatry

## 2019-12-12 ENCOUNTER — Ambulatory Visit (INDEPENDENT_AMBULATORY_CARE_PROVIDER_SITE_OTHER): Payer: 59 | Admitting: Podiatry

## 2019-12-12 DIAGNOSIS — M722 Plantar fascial fibromatosis: Secondary | ICD-10-CM | POA: Diagnosis not present

## 2019-12-12 DIAGNOSIS — M7732 Calcaneal spur, left foot: Secondary | ICD-10-CM | POA: Diagnosis not present

## 2019-12-12 NOTE — Progress Notes (Signed)
Subjective:  Patient ID: Marie Peterson, female    DOB: 01/17/1966,  MRN: [id number]  No chief complaint on file.   54 y.o. female presents with the above complaint.  Patient presents with a follow-up of left plantar fasciitis.  Patient states is doing a lot better about greater than 80% improved.  She states she still has some residual pain.  Overall she has done really well.  She has been wearing her brace as well.  She denies any other acute complaints.  Review of Systems: Negative except as noted in the HPI. Denies N/V/F/Ch.  Past Medical History:  Diagnosis Date  . Arthritis   . Atrophic vaginitis   . Atypical chest pain    thought to be GERD; myoview 05/17/07-no significant ischemia; echo 07/20/11- normal EF=>55%  . Baker's cyst 03/28/12   venous doppler 03/28/12=no thrombus; baker's cyst at left popliteal fossa  . Chest pain   . CIN I (cervical intraepithelial neoplasia I) 1990  . Degenerative disc disease, cervical 2020   severe, C3-4, 4-5, 5-6. Currently receiving injections by Dr. Vira Blanco of preferred pain management  . Fibroid   . Fibromyalgia   . Hypertension   . Lupus nephritis ([place])   . Migraines   . Neuropathy   . Osteopenia 09/2017   T score -2.0 improved from prior study  . Premature menopause age 84   following chemotherapy for Lupus  . Shingles     Current Outpatient Medications:  .  AJOVY 225 MG/1.5ML SOSY, INJECT 675MG  UNDER THE SKIN EVERY 3 MONTHS, Disp: 4.5 mL, Rfl: 11 .  alendronate (FOSAMAX) 70 MG tablet, Take 1 tablet (70 mg total) by mouth every 7 (seven) days. Take with a full glass of water on an empty stomach., Disp: 4 tablet, Rfl: 5 .  amLODipine-benazepril (LOTREL) 5-40 MG capsule, Take 1 capsule by mouth at bedtime., Disp: 90 capsule, Rfl: 1 .  aspirin 81 MG tablet, Take 81 mg by mouth daily., Disp: , Rfl:  .  benzonatate (TESSALON) 100 MG capsule, Take 100 mg by mouth 3 (three) times daily., Disp: , Rfl:  .  Cholecalciferol (VITAMIN D PO), Take 2,000  Int'l Units by mouth daily. , Disp: , Rfl:  .  clotrimazole-betamethasone (LOTRISONE) cream, Apply 1 application topically 2 (two) times daily., Disp: 30 g, Rfl: 0 .  diclofenac sodium (VOLTAREN) 1 % GEL, Apply topically., Disp: , Rfl:  .  FERREX 150 150 MG capsule, Take 1 tablet by mouth daily., Disp: , Rfl: 1 .  folic acid (FOLVITE) 742 MCG tablet, Take 400 mcg by mouth daily., Disp: , Rfl:  .  furosemide (LASIX) 40 MG tablet, Take 1 tablet by mouth 2 (two) times daily. TAKE 1 AND 1/2 TABLET IN THE MORNING AND 1 TABLET AT NIGHT., Disp: , Rfl: 0 .  gabapentin (NEURONTIN) 600 MG tablet, Take 600mg  in am, 600mg  at lunch and 1200mg  at bedtime, Disp: 270 tablet, Rfl: 0 .  hydrALAZINE (APRESOLINE) 10 MG tablet, Take 10 mg by mouth 2 (two) times daily., Disp: , Rfl:  .  hydroxychloroquine (PLAQUENIL) 200 MG tablet, Take 200 mg by mouth 2 (two) times daily. , Disp: , Rfl: 0 .  L-Methylfolate-B6-B12 (METANX PO), Take 1 tablet by mouth 2 (two) times daily. , Disp: , Rfl:  .  lidocaine (LIDODERM) 5 %, Place 3 patches onto the skin daily. Remove & Discard patch within 12 hours or as directed by MD (Patient taking differently: Place 2 patches onto the skin daily. Remove &  Discard patch within 12 hours or as directed by MD For use as needed), Disp: 90 patch, Rfl: 6 .  LORazepam (ATIVAN) 0.5 MG tablet, Take 0.5 mg by mouth daily as needed., Disp: , Rfl:  .  magnesium oxide (MAGNESIUM-OXIDE) 400 (241.3 Mg) MG tablet, Take 1 tablet (400 mg total) by mouth daily., Disp: 90 tablet, Rfl: 3 .  Multiple Vitamin (MULTIVITAMIN) tablet, Take 1 tablet by mouth daily., Disp: , Rfl:  .  nabumetone (RELAFEN) 500 MG tablet, Take 500 mg by mouth 2 (two) times daily as needed., Disp: , Rfl: 0 .  Omega-3 Fatty Acids (FISH OIL) 1000 MG CAPS, Take 2 capsules by mouth daily., Disp: , Rfl:  .  omeprazole (PRILOSEC) 40 MG capsule, Take 40 mg by mouth daily., Disp: , Rfl: 1 .  ondansetron (ZOFRAN-ODT) 4 MG disintegrating tablet,  DISSOLVE 1 TABLET ON THE TONGUE EVERY 8 HOURS AS NEEDED FOR NAUSEA OR VOMITING, Disp: 20 tablet, Rfl: 11 .  oxyCODONE-acetaminophen (PERCOCET) 7.5-325 MG tablet, Take 1 tablet by mouth every 8 (eight) hours., Disp: , Rfl:  .  phentermine (ADIPEX-P) 37.5 MG tablet, Take 37.5 mg by mouth daily., Disp: , Rfl:  .  potassium chloride SA (KLOR-CON) 20 MEQ tablet, TAKE 1 TABLET(20 MEQ) BY MOUTH DAILY, Disp: 90 tablet, Rfl: 3 .  predniSONE (DELTASONE) 5 MG tablet, Take 5 mg by mouth every 3 (three) days. Take one tablet every 3 days, Disp: , Rfl:  .  Rimegepant Sulfate (NURTEC) 75 MG TBDP, Take 75 mg by mouth daily as needed (take for abortive therapy of migraine, no more than 1 tablet in 24 hours or 10 per month)., Disp: 10 tablet, Rfl: 11 .  sertraline (ZOLOFT) 100 MG tablet, sertraline 100 mg tablet  TAKE 1 TABLET BY MOUTH EVERY DAY, Disp: , Rfl:  .  Suvorexant (BELSOMRA) 20 MG TABS, Take 20 mg by mouth Nightly. Take one tablet at bedtime daily, Disp: , Rfl:  .  tolterodine (DETROL LA) 4 MG 24 hr capsule, Take 4 mg by mouth daily., Disp: , Rfl:  .  venlafaxine XR (EFFEXOR-XR) 37.5 MG 24 hr capsule, Take 37.5 mg by mouth daily. , Disp: , Rfl:  .  XIIDRA 5 % SOLN, Place 1 drop into both eyes 2 (two) times daily., Disp: , Rfl:   Social History   Tobacco Use  Smoking Status Never Smoker  Smokeless Tobacco Never Used    Allergies  Allergen Reactions  . Ubrogepant Swelling    Lip swelling   . Cymbalta [Duloxetine Hcl]     Lip swelling  . Hygroton [Chlorthalidone] Swelling    Swelling in lips  . Myrbetriq [Mirabegron] Itching   Objective:  There were no vitals filed for this visit. There is no height or weight on file to calculate BMI. Constitutional Well developed. Well nourished.  Vascular Dorsalis pedis pulses palpable bilaterally. Posterior tibial pulses palpable bilaterally. Capillary refill normal to all digits.  No cyanosis or clubbing noted. Pedal hair growth normal.  Neurologic  Normal speech. Oriented to person, place, and time. Epicritic sensation to light touch grossly present bilaterally.  Dermatologic Nails well groomed and normal in appearance. No open wounds. Epidermolytic scaly patches noted to the right lower extremity consistent with athlete's foot subjective findings of itching noted.  Orthopedic: Normal joint ROM without pain or crepitus bilaterally. No visible deformities. Mild tender to palpation at the calcaneal tuber left. No pain with calcaneal squeeze left. Ankle ROM diminished range of motion left. Silfverskiold Test: positive  left.   Radiographs: None  Assessment:   1. Plantar fasciitis of left foot   2. Heel spur, left    Plan:  Patient was evaluated and treated and all questions answered.  Athlete's foot right -I explained patient the etiology of athlete's foot and various treatment options were extensively discussed with the patient.  Given that this is mild to moderate in nature I believe she will benefit from over-the-counter cream to help decrease the fungal growth and therefore itching associated.  Lotrisone cream was sent to the pharmacy.  Patient will apply twice a day.  Plantar Fasciitis, left recurrence - XR reviewed as above.  - Educated on icing and stretching. Instructions given.  -Second injection delivered to the plantar fascia as below. - DME: Can transition out of the plantar fascial brace into sneakers and orthotics - Pharmacologic management: None  Procedure: Injection Tendon/Ligament Location: Left plantar fascia at the glabrous junction; medial approach. Skin Prep: alcohol Injectate: 0.5 cc 0.5% marcaine plain, 0.5 cc of 1% Lidocaine, 0.5 cc kenalog 10. Disposition: Patient tolerated procedure well. Injection site dressed with a band-aid.  No follow-ups on file.

## 2019-12-29 ENCOUNTER — Other Ambulatory Visit: Payer: Self-pay | Admitting: *Deleted

## 2019-12-29 MED ORDER — GABAPENTIN 600 MG PO TABS: ORAL_TABLET | ORAL | 0 refills | Status: DC

## 2019-12-30 DIAGNOSIS — S62222A Displaced Rolando's fracture, left hand, initial encounter for closed fracture: Secondary | ICD-10-CM | POA: Diagnosis not present

## 2019-12-31 ENCOUNTER — Other Ambulatory Visit: Payer: Self-pay | Admitting: Obstetrics and Gynecology

## 2019-12-31 ENCOUNTER — Other Ambulatory Visit: Payer: Self-pay | Admitting: Otolaryngology

## 2019-12-31 DIAGNOSIS — Z1231 Encounter for screening mammogram for malignant neoplasm of breast: Secondary | ICD-10-CM

## 2020-01-05 ENCOUNTER — Telehealth: Payer: Self-pay

## 2020-01-05 NOTE — Telephone Encounter (Signed)
Called and advised pt of increasing hydralazine to 20mg  bid and the pt voiced understanding and we scheduled her htn clinic 4 wk f/up

## 2020-01-05 NOTE — Telephone Encounter (Signed)
Please increase hydralazine dose to 20mg  twice daily and schedule follow up in 3-4 weeks with HTN clinic

## 2020-01-05 NOTE — Telephone Encounter (Signed)
Pt called into let you know that she would like for you to call her back regarding pcp dcd amlodipine benazapril tablet combo due to swelling. just taking amlodipine only but bottom number is increasing and she would like to know what to do

## 2020-01-08 ENCOUNTER — Other Ambulatory Visit: Payer: Self-pay | Admitting: Cardiovascular Disease

## 2020-01-14 NOTE — Progress Notes (Addendum)
Chief Complaint  Patient presents with  . Follow-up    rm 6  . Migraine    Pt said her migraine are more but she has been under a lot of stess     HISTORY OF PRESENT ILLNESS: Today 01/15/20  Marie Peterson is a 54 y.o. female here today for follow up for migraines. Sleep eval was normal. She continues  Ajovy and gabapentin 600/600/1200. She was started on Effexor by psychiatry but had an allergic reaction. She is now taking sertraline. She continues Nurtec for abortive therapy and feels that it helps with pain but does not abort migraine. She continues to have daily tension headaches. She has about 8 migraines per month. She admits stress is contributor. She is followed closely by [name]. She has chronic pain on oxycodone TID.    HISTORY (copied from Dr Edwena Felty note on 09/11/2019)  Marie Peterson a 54  year-old [place] or [place] American female patientestablished with Dr. Jaynee Peterson and seen here upon a referralon 09/11/2019 from Dr Maudie Mercury for sleep disordered breathing. Chiefconcernaccording to patient :  [name] reports that her daughter has slept for a while at her house and noticed her to twitch and move during her sleep may be more of a trembling.  She also noted irregular breathing and pauses in her sleep breathing.  For this reason she is referred for evaluation of possible sleep apnea.  Dr. [name] referred.  I reviewed the patient's medication she is on Percocet, she is still on oxycodone.   Prednisone, amlodipine Benzapril, alendronate, omeprazole, Tessalon Perles as needed furosemide 40 mg actually 1.5 tablets in the morning and 1 in the night, hydralazine twice a day, no tach, Belsomra 20 mg Trintellix 10 mg not also on gabapentin 3 times daily. A lot of medications with sleep inducing or sleep changing potential.   Marie Peterson isa right -handed Black or [place] American female with a possible sleep disorder. She has a past medical history of Lupus, Arthritis,  Atrophic vaginitis, Atypical chest pain, Baker's cyst (03/28/12), Chest pain, CIN I (cervical intraepithelial neoplasia I) (1990), Degenerative disc disease, cervical (2020), Fibroid, "Fibromyalgia", Hypertension, Lupus nephritis ([place]), Migraines, Neuropathy, Osteopenia (09/2017), Premature menopause (age 16), and Shingles.  Sleeprelevant medical history: Nocturia- diuretic induced 3-4 times , No Sleep walking, sinus surgery  Familymedical /sleep history:niece is another family member on CPAP with OSA.  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 Family status is separated. Tobacco use- none . ETOH use ;none , Caffeine intake in form of Coffee( 1 cup daily  Soda( none) Tea ( rare ) nor energy drinks. Regular exercise - none .   Sleep habits are as follows:The patient's dinner time is between 6 PM. The patient goes to bed at variable times , 10-11 PM and struggles to sleep-once asleep continues to sleep for 1-3 hours, wakes for many bathroom breaks.   The preferred sleep position is supine of on her side, with the support of 2 pillows. She sleeps with a boot, plantar fasciitis.   Dreams are reportedly frequent.   8.30 AM is the usual rise time. The patient wakes up spontaneously between 4.30 and 5 AM , but tries to get more sleep.   She reports not feeling refreshed or restored in AM, with symptoms such as dry mouth , morning headaches , and residual fatigue. Naps are taken frequently, lasting from 1-2 hours , feels  these are less refreshing than nocturnal sleep. She reportedly yawns al through the day.   "Addendum Marie Peterson/ Dr Marie Peterson have followed for Migraine and here is the last note from 03-11-2019: Marie Peterson a 54 y.o.femalehere today for follow up. She is under more stress recently with her dad's new diagnosis of prostate and brain cancer. She is one of 12 children and feels that they all look to her for  support. She has noticed headaches are worse. Same headache but more frequent. She continues to do well with Ajovy every three months, gabapentin 600mg  three times daily and Nurtec for abortive therapy. BP has been elevated. She is currently separated from her husband. She is followed by [name]. Effexor was recently switched to Trintellix. She feels that Trintellix is making her sick on her stomach. She is followed closely by PCP as well. She is taking hydralazine, amlodipine and benazepril for HTN. She is on chronic opioids for pain management. "  History (copied from my note on 03/11/2019)  Marie Peterson is a 54 y.o. female here today for follow up. She is under more stress recently with her dad's new diagnosis of prostate and brain cancer. She is one of 12 children and feels that they all look to her for support. She has noticed headaches are worse. Same headache but more frequent. She continues to do well with Ajovy every three months, gabapentin 600mg  three times daily and Nurtec for abortive therapy. BP has been elevated. She is currently separated from her husband. She is followed by [name]. Effexor was recently switched to Trintellix. She feels that Trintellix is making her sick on her stomach. She is followed closely by  PCP as well. She is taking hydralazine, amlodipine and benazepril for HTN. She is on chronic opioids for pain management.   HISTORY: (copied from my note on 10/31/2018)  Marie Peterson a 54 y.o.femalehere today for follow up. She has had an increase in migraines. She is having daily headaches. She reports at least 2-3 migrainous headaches each week with light/sound sensitivity and nausea. Heat and stress make headaches worse. She is going through a separation currently and has noticed migraines are worse over the past month. She is using Ajovy injections (3injections every three months). She is taking gabapentin 600mg  TID for neuropathy. She is taking Percocet 7.5/325mg   for severe DDD prescribed by PCP. She is also seeing rheumatology for Lupus. She uses Zomig for abortive therapy that does ease up the headache. She occasionally takes Zofran for nausea with migraine.   She is taking Belsomra for sleep. She does not feel it is helping her. She continues to wake frequently throughout the night. She does not snore. She is admittedly more depressed. She has not spoken with PCP due to him being out of the office for medical leave. She is not taking any antidepressants at this time. She does not remember being on anything in the past with the exception of Cymbalta for pain. This medications did cause mild lip swelling. No respiratory involvement , no swollen tongue or airway. No trouble breathing.   REVIEW OF SYSTEMS: Out of a complete 14 system review of symptoms, the patient complains only of the following symptoms, headaches, chronic pain, anxiety and depression and all other reviewed systems are negative.   ALLERGIES: Allergies  Allergen Reactions  . Ubrogepant Swelling    Lip swelling   . Cymbalta [Duloxetine Hcl]     Lip swelling  . Hygroton [Chlorthalidone] Swelling  Swelling in lips  . Myrbetriq [Mirabegron] Itching     HOME MEDICATIONS: Outpatient Medications Prior to Visit  Medication Sig Dispense Refill  . AJOVY 225 MG/1.5ML SOSY INJECT 675MG  UNDER THE SKIN EVERY 3 MONTHS 4.5 mL 11  . alendronate (FOSAMAX) 70 MG tablet Take 1 tablet (70 mg total) by mouth every 7 (seven) days. Take with a full glass of water on an empty stomach. 4 tablet 5  . amLODipine-benazepril (LOTREL) 5-40 MG capsule Take 1 capsule by mouth at bedtime. 90 capsule 1  . aspirin 81 MG tablet Take 81 mg by mouth daily.    . benzonatate (TESSALON) 100 MG capsule Take 100 mg by mouth 3 (three) times daily.    . Cholecalciferol (VITAMIN D PO) Take 2,000 Int'l Units by mouth daily.     . clotrimazole-betamethasone (LOTRISONE) cream Apply 1 application topically 2 (two) times  daily. 30 g 0  . diclofenac sodium (VOLTAREN) 1 % GEL Apply topically.    [name] Kitchen FERREX 150 150 MG capsule Take 1 tablet by mouth daily.  1  . folic acid (FOLVITE) 644 MCG tablet Take 400 mcg by mouth daily.    . furosemide (LASIX) 40 MG tablet Take 1 tablet by mouth 2 (two) times daily. TAKE 1 AND 1/2 TABLET IN THE MORNING AND 1 TABLET AT NIGHT.  0  . gabapentin (NEURONTIN) 600 MG tablet Take 600mg  in am, 600mg  at lunch and 1200mg  at bedtime 360 tablet 0  . hydrALAZINE (APRESOLINE) 10 MG tablet Take 10 mg by mouth 2 (two) times daily.    . hydroxychloroquine (PLAQUENIL) 200 MG tablet Take 200 mg by mouth 2 (two) times daily.   0  . L-Methylfolate-B6-B12 (METANX PO) Take 1 tablet by mouth 2 (two) times daily.     [name] Kitchen lidocaine (LIDODERM) 5 % Place 3 patches onto the skin daily. Remove & Discard patch within 12 hours or as directed by MD (Patient taking differently: Place 2 patches onto the skin daily. Remove & Discard patch within 12 hours or as directed by MD For use as needed) 90 patch 6  . LORazepam (ATIVAN) 0.5 MG tablet Take 0.5 mg by mouth daily as needed.    . magnesium oxide (MAGNESIUM-OXIDE) 400 (241.3 Mg) MG tablet Take 1 tablet (400 mg total) by mouth daily. 90 tablet 3  . Multiple Vitamin (MULTIVITAMIN) tablet Take 1 tablet by mouth daily.    . Omega-3 Fatty Acids (FISH OIL) 1000 MG CAPS Take 2 capsules by mouth daily.    [name] Kitchen omeprazole (PRILOSEC) 40 MG capsule Take 40 mg by mouth daily.  1  . ondansetron (ZOFRAN-ODT) 4 MG disintegrating tablet DISSOLVE 1 TABLET ON THE TONGUE EVERY 8 HOURS AS NEEDED FOR NAUSEA OR VOMITING 20 tablet 11  . oxyCODONE-acetaminophen (PERCOCET) 7.5-325 MG tablet Take 1 tablet by mouth every 8 (eight) hours.    . potassium chloride SA (KLOR-CON) 20 MEQ tablet TAKE 1 TABLET(20 MEQ) BY MOUTH DAILY 90 tablet 3  . predniSONE (DELTASONE) 5 MG tablet Take 5 mg by mouth every 3 (three) days. Take one tablet every 3 days    . Rimegepant Sulfate (NURTEC) 75 MG TBDP Take 75 mg  by mouth daily as needed (take for abortive therapy of migraine, no more than 1 tablet in 24 hours or 10 per month). 10 tablet 11  . sertraline (ZOLOFT) 100 MG tablet sertraline 100 mg tablet  TAKE 1 TABLET BY MOUTH EVERY DAY    . Suvorexant (BELSOMRA) 20 MG TABS Take 20  mg by mouth Nightly. Take one tablet at bedtime daily    . tolterodine (DETROL LA) 4 MG 24 hr capsule Take 4 mg by mouth daily.    [name] Kitchen XIIDRA 5 % SOLN Place 1 drop into both eyes 2 (two) times daily.    . nabumetone (RELAFEN) 500 MG tablet Take 500 mg by mouth 2 (two) times daily as needed.  0  . phentermine (ADIPEX-P) 37.5 MG tablet Take 37.5 mg by mouth daily.    [name] Kitchen venlafaxine XR (EFFEXOR-XR) 37.5 MG 24 hr capsule Take 37.5 mg by mouth daily.      No facility-administered medications prior to visit.     PAST MEDICAL HISTORY: Past Medical History:  Diagnosis Date  . Arthritis   . Atrophic vaginitis   . Atypical chest pain    thought to be GERD; myoview 05/17/07-no significant ischemia; echo 07/20/11- normal EF=>55%  . Baker's cyst 03/28/12   venous doppler 03/28/12=no thrombus; baker's cyst at left popliteal fossa  . Chest pain   . CIN I (cervical intraepithelial neoplasia I) 1990  . Degenerative disc disease, cervical 2020   severe, C3-4, 4-5, 5-6. Currently receiving injections by Dr. Vira Blanco of preferred pain management  . Fibroid   . Fibromyalgia   . Hypertension   . Lupus nephritis ([place])   . Migraines   . Neuropathy   . Osteopenia 09/2017   T score -2.0 improved from prior study  . Premature menopause age 64   following chemotherapy for Lupus  . Shingles      PAST SURGICAL HISTORY: Past Surgical History:  Procedure Laterality Date  . cervical fossa injections  2020  . CESAREAN SECTION    . COLPOSCOPY    . HYSTEROSCOPY     Myomectomy  . MYOMECTOMY     Hysteroscopic myomectomy  . NASAL SINUS SURGERY  01/2016  . thumb surg  07/07/2019  . TUBAL LIGATION       FAMILY HISTORY: Family History  Problem  Relation Age of Onset  . Hypertension Mother   . Asthma Mother   . COPD Mother   . Diabetes Father   . Hypertension Father   . Heart disease Father   . Cancer Father        prostate     SOCIAL HISTORY: Social History   Socioeconomic History  . Marital status: Married    Spouse name: [name]  . Number of children: 2  . Years of education: College  . Highest education level: Not on file  Occupational History    Employer: DISABLED    Comment: disability  Tobacco Use  . Smoking status: Never Smoker  . Smokeless tobacco: Never Used  Vaping Use  . Vaping Use: Never used  Substance and Sexual Activity  . Alcohol use: No    Alcohol/week: 0.0 standard drinks  . Drug use: No  . Sexual activity: Not Currently    Birth control/protection: Surgical, Post-menopausal    Comment: Tubal lig-1st intercourse 54 yo-Fewer than 5 partners  Other Topics Concern  . Not on file  Social History Narrative   Patient lives at home with her daughter. She is currently separated (not legally).   Caffeine Use: 1 cup of coffee daily.    Right-handed   Social Determinants of Health   Financial Resource Strain:   . Difficulty of Paying Living Expenses: Not on file  Food Insecurity:   . Worried About Charity fundraiser in the Last Year: Not on file  . Ran Out  of Food in the Last Year: Not on file  Transportation Needs:   . Lack of Transportation (Medical): Not on file  . Lack of Transportation (Non-Medical): Not on file  Physical Activity:   . Days of Exercise per Week: Not on file  . Minutes of Exercise per Session: Not on file  Stress:   . Feeling of Stress : Not on file  Social Connections:   . Frequency of Communication with Friends and Family: Not on file  . Frequency of Social Gatherings with Friends and Family: Not on file  . Attends Religious Services: Not on file  . Active Member of Clubs or Organizations: Not on file  . Attends Archivist Meetings: Not on file  .  Marital Status: Not on file  Intimate Partner Violence:   . Fear of Current or Ex-Partner: Not on file  . Emotionally Abused: Not on file  . Physically Abused: Not on file  . Sexually Abused: Not on file      PHYSICAL EXAM  Vitals:   01/15/20 0803  BP: (!) 152/82  Pulse: (!) 53  Weight: 182 lb (82.6 kg)  Height: 5\' 4"  (1.626 m)   Body mass index is 31.24 kg/m.   Generalized: Well developed, in no acute distress   Neurological examination  Mentation: Alert oriented to time, place, history taking. Follows all commands speech and language fluent Cranial nerve II-XII: Pupils were equal round reactive to light. Extraocular movements were full, visual field were full  Motor: The motor testing reveals 5 over 5 strength of all 4 extremities. Good symmetric motor tone is noted throughout.  Gait and station: Gait is normal.      DIAGNOSTIC DATA (LABS, IMAGING, TESTING) - I reviewed patient records, labs, notes, testing and imaging myself where available.  Lab Results  Component Value Date   WBC 3.6 (L) 07/01/2013   HGB 12.8 07/01/2013   HCT 39.3 07/01/2013   MCV 94.2 07/01/2013   PLT 188 07/01/2013      Component Value Date/Time   NA 140 11/25/2019 0820   NA 142 07/01/2013 1104   K 3.8 11/25/2019 0820   K 4.2 07/01/2013 1104   CL 98 11/25/2019 0820   CO2 27 11/25/2019 0820   CO2 26 07/01/2013 1104   GLUCOSE 68 11/25/2019 0820   GLUCOSE 61 (L) 09/10/2015 0833   GLUCOSE 94 07/01/2013 1104   BUN 20 11/25/2019 0820   BUN 12.7 07/01/2013 1104   CREATININE 0.89 11/25/2019 0820   CREATININE 0.98 09/10/2015 0833   CREATININE 0.9 07/01/2013 1104   CALCIUM 10.1 11/25/2019 0820   CALCIUM 9.5 07/01/2013 1104   PROT 6.9 08/08/2019 0853   PROT 6.7 07/01/2013 1104   ALBUMIN 4.1 08/08/2019 0853   ALBUMIN 3.9 07/01/2013 1104   AST 28 08/08/2019 0853   AST 47 (H) 07/01/2013 1104   ALT 20 08/08/2019 0853   ALT 36 07/01/2013 1104   ALKPHOS 101 08/08/2019 0853   ALKPHOS 80  07/01/2013 1104   BILITOT 0.4 08/08/2019 0853   BILITOT 0.48 07/01/2013 1104   GFRNONAA 73 11/25/2019 0820   GFRAA 84 11/25/2019 0820   Lab Results  Component Value Date   CHOL 191 08/08/2019   HDL 70 08/08/2019   LDLCALC 111 (H) 08/08/2019   TRIG 54 08/08/2019   CHOLHDL 2.7 08/08/2019   Lab Results  Component Value Date   HGBA1C 5.4 09/10/2018   Lab Results  Component Value Date   VITAMINB12 >2000 (H) 09/10/2018  Lab Results  Component Value Date   TSH 1.27 09/10/2015      ASSESSMENT AND PLAN  54 y.o. year old female  has a past medical history of Arthritis, Atrophic vaginitis, Atypical chest pain, Baker's cyst (03/28/12), Chest pain, CIN I (cervical intraepithelial neoplasia I) (1990), Degenerative disc disease, cervical (2020), Fibroid, Fibromyalgia, Hypertension, Lupus nephritis ([place]), Migraines, Neuropathy, Osteopenia (09/2017), Premature menopause (age 64), and Shingles. here with   Migraine without aura and without status migrainosus, not intractable - Plan: naratriptan (AMERGE) 2.5 MG tablet  Other chronic pain  Depression with anxiety   Magdalina continues to have daily tension headaches with about 8 migraine days per month. She has significant anxiety and depression as well as chronic pain that contributes. She will continue close follow up with psychiatry and PCP. I will continue Ajovy and gabapentin. We have discussed considering Botox but she is hesitant. We will try Amerge 2.5mg  as needed for abortive therapy. Healthy lifestyle habits encouraged. She will follow up with me in 6-12 months.   I spent 20 minutes of face-to-face and non-face-to-face time with patient.  This included previsit chart review, lab review, study review, order entry, electronic health record documentation, patient education.    Marie Presto, MSN, FNP-C 01/15/2020, 8:36 AM  [facility] [address] [place], [place] [address] [phone]  Made any corrections  needed, and agree with history, physical, neuro exam,assessment and plan as stated.     Sarina Ill, MD [facility]

## 2020-01-15 ENCOUNTER — Ambulatory Visit (INDEPENDENT_AMBULATORY_CARE_PROVIDER_SITE_OTHER): Payer: 59 | Admitting: Family Medicine

## 2020-01-15 ENCOUNTER — Encounter: Payer: Self-pay | Admitting: Family Medicine

## 2020-01-15 VITALS — BP 152/82 | HR 53 | Ht 64.0 in | Wt 182.0 lb

## 2020-01-15 DIAGNOSIS — G8929 Other chronic pain: Secondary | ICD-10-CM | POA: Diagnosis not present

## 2020-01-15 DIAGNOSIS — F418 Other specified anxiety disorders: Secondary | ICD-10-CM | POA: Diagnosis not present

## 2020-01-15 DIAGNOSIS — G43009 Migraine without aura, not intractable, without status migrainosus: Secondary | ICD-10-CM

## 2020-01-15 MED ORDER — NARATRIPTAN HCL 2.5 MG PO TABS: 2.5000 mg | ORAL_TABLET | ORAL | 11 refills | Status: DC | PRN

## 2020-01-15 NOTE — Patient Instructions (Signed)
We will continue Ajovy every month and gabapentin 600mg  in am, 600mg  at lunch and 1200mg  at bedtime. We will try Amerge for abortive therapy.   Consider Botox for migraine prevention  Continue close follow up with PCP and psychiatry.   Follow up with me in 6-12 months     OnabotulinumtoxinA injection (Medical Use) What is this medicine? ONABOTULINUMTOXINA (o na BOTT you lye num tox in eh) is a neuro-muscular blocker. This medicine is used to treat crossed eyes, eyelid spasms, severe neck muscle spasms, ankle and toe muscle spasms, and elbow, wrist, and finger muscle spasms. It is also used to treat excessive underarm sweating, to prevent chronic migraine headaches, and to treat loss of bladder control due to neurologic conditions such as multiple sclerosis or spinal cord injury. This medicine may be used for other purposes; ask your health care provider or pharmacist if you have questions. COMMON BRAND NAME(S): Botox What should I tell my health care provider before I take this medicine? They need to know if you have any of these conditions:  breathing problems  cerebral palsy spasms  difficulty urinating  heart problems  history of surgery where this medicine is going to be used  infection at the site where this medicine is going to be used  myasthenia gravis or other neurologic disease  nerve or muscle disease  surgery plans  take medicines that treat or prevent blood clots  thyroid problems  an unusual or allergic reaction to botulinum toxin, albumin, other medicines, foods, dyes, or preservatives  pregnant or trying to get pregnant  breast-feeding How should I use this medicine? This medicine is for injection into a muscle. It is given by a health care professional in a hospital or clinic setting. Talk to your pediatrician regarding the use of this medicine in children. While this drug may be prescribed for children as young as 46 years old for selected conditions,  precautions do apply. Overdosage: If you think you have taken too much of this medicine contact a poison control center or emergency room at once. NOTE: This medicine is only for you. Do not share this medicine with others. What if I miss a dose? This does not apply. What may interact with this medicine?  aminoglycoside antibiotics like gentamicin, neomycin, tobramycin  muscle relaxants  other botulinum toxin injections This list may not describe all possible interactions. Give your health care provider a list of all the medicines, herbs, non-prescription drugs, or dietary supplements you use. Also tell them if you smoke, drink alcohol, or use illegal drugs. Some items may interact with your medicine. What should I watch for while using this medicine? Visit your doctor for regular check ups. This medicine will cause weakness in the muscle where it is injected. Tell your doctor if you feel unusually weak in other muscles. Get medical help right away if you have problems with breathing, swallowing, or talking. This medicine might make your eyelids droop or make you see blurry or double. If you have weak muscles or trouble seeing do not drive a car, use machinery, or do other dangerous activities. This medicine contains albumin from human blood. It may be possible to pass an infection in this medicine, but no cases have been reported. Talk to your doctor about the risks and benefits of this medicine. If your activities have been limited by your condition, go back to your regular routine slowly after treatment with this medicine. What side effects may I notice from receiving this medicine?  Side effects that you should report to your doctor or health care professional as soon as possible:  allergic reactions like skin rash, itching or hives, swelling of the face, lips, or tongue  breathing problems  changes in vision  chest pain or tightness  eye irritation, pain  fast, irregular  heartbeat  infection  numbness  speech problems  swallowing problems  unusual weakness Side effects that usually do not require medical attention (report to your doctor or health care professional if they continue or are bothersome):  bruising or pain at site where injected  drooping eyelid  dry eyes or mouth  headache  muscles aches, pains  sensitivity to light  tearing This list may not describe all possible side effects. Call your doctor for medical advice about side effects. You may report side effects to FDA at 1-800-FDA-1088. Where should I keep my medicine? This drug is given in a hospital or clinic and will not be stored at home. NOTE: This sheet is a summary. It may not cover all possible information. If you have questions about this medicine, talk to your doctor, pharmacist, or health care provider.  2020 Elsevier/Gold Standard (2017-09-17 14:21:42)   Migraine Headache A migraine headache is a very strong throbbing pain on one side or both sides of your head. This type of headache can also cause other symptoms. It can last from 4 hours to 3 days. Talk with your doctor about what things may bring on (trigger) this condition. What are the causes? The exact cause of this condition is not known. This condition may be triggered or caused by:  Drinking alcohol.  Smoking.  Taking medicines, such as: ? Medicine used to treat chest pain (nitroglycerin). ? Birth control pills. ? Estrogen. ? Some blood pressure medicines.  Eating or drinking certain products.  Doing physical activity. Other things that may trigger a migraine headache include:  Having a menstrual period.  Pregnancy.  Hunger.  Stress.  Not getting enough sleep or getting too much sleep.  Weather changes.  Tiredness (fatigue). What increases the risk?  Being 20-67 years old.  Being female.  Having a family history of migraine headaches.  Being Caucasian.  Having depression or  anxiety.  Being very overweight. What are the signs or symptoms?  A throbbing pain. This pain may: ? Happen in any area of the head, such as on one side or both sides. ? Make it hard to do daily activities. ? Get worse with physical activity. ? Get worse around bright lights or loud noises.  Other symptoms may include: ? Feeling sick to your stomach (nauseous). ? Vomiting. ? Dizziness. ? Being sensitive to bright lights, loud noises, or smells.  Before you get a migraine headache, you may get warning signs (an aura). An aura may include: ? Seeing flashing lights or having blind spots. ? Seeing bright spots, halos, or zigzag lines. ? Having tunnel vision or blurred vision. ? Having numbness or a tingling feeling. ? Having trouble talking. ? Having weak muscles.  Some people have symptoms after a migraine headache (postdromal phase), such as: ? Tiredness. ? Trouble thinking (concentrating). How is this treated?  Taking medicines that: ? Relieve pain. ? Relieve the feeling of being sick to your stomach. ? Prevent migraine headaches.  Treatment may also include: ? Having acupuncture. ? Avoiding foods that bring on migraine headaches. ? Learning ways to control your body functions (biofeedback). ? Therapy to help you know and deal with negative thoughts (cognitive behavioral  therapy). Follow these instructions at home: Medicines  Take over-the-counter and prescription medicines only as told by your doctor.  Ask your doctor if the medicine prescribed to you: ? Requires you to avoid driving or using heavy machinery. ? Can cause trouble pooping (constipation). You may need to take these steps to prevent or treat trouble pooping:  Drink enough fluid to keep your pee (urine) pale yellow.  Take over-the-counter or prescription medicines.  Eat foods that are high in fiber. These include beans, whole grains, and fresh fruits and vegetables.  Limit foods that are high in fat  and sugar. These include fried or sweet foods. Lifestyle  Do not drink alcohol.  Do not use any products that contain nicotine or tobacco, such as cigarettes, e-cigarettes, and chewing tobacco. If you need help quitting, ask your doctor.  Get at least 8 hours of sleep every night.  Limit and deal with stress. General instructions      Keep a journal to find out what may bring on your migraine headaches. For example, write down: ? What you eat and drink. ? How much sleep you get. ? Any change in what you eat or drink. ? Any change in your medicines.  If you have a migraine headache: ? Avoid things that make your symptoms worse, such as bright lights. ? It may help to lie down in a dark, quiet room. ? Do not drive or use heavy machinery. ? Ask your doctor what activities are safe for you.  Keep all follow-up visits as told by your doctor. This is important. Contact a doctor if:  You get a migraine headache that is different or worse than others you have had.  You have more than 15 headache days in one month. Get help right away if:  Your migraine headache gets very bad.  Your migraine headache lasts longer than 72 hours.  You have a fever.  You have a stiff neck.  You have trouble seeing.  Your muscles feel weak or like you cannot control them.  You start to lose your balance a lot.  You start to have trouble walking.  You pass out (faint).  You have a seizure. Summary  A migraine headache is a very strong throbbing pain on one side or both sides of your head. These headaches can also cause other symptoms.  This condition may be treated with medicines and changes to your lifestyle.  Keep a journal to find out what may bring on your migraine headaches.  Contact a doctor if you get a migraine headache that is different or worse than others you have had.  Contact your doctor if you have more than 15 headache days in a month. This information is not intended  to replace advice given to you by your health care provider. Make sure you discuss any questions you have with your health care provider. Document Revised: 07/05/2018 Document Reviewed: 04/25/2018 Elsevier Patient Education  [place].

## 2020-01-21 ENCOUNTER — Other Ambulatory Visit: Payer: Self-pay

## 2020-01-21 ENCOUNTER — Ambulatory Visit
Admission: RE | Admit: 2020-01-21 | Discharge: 2020-01-21 | Disposition: A | Discharge status: Home / Self Care | Payer: 59 | Source: Ambulatory Visit | Attending: Obstetrics and Gynecology | Admitting: Obstetrics and Gynecology

## 2020-01-21 DIAGNOSIS — Z1231 Encounter for screening mammogram for malignant neoplasm of breast: Secondary | ICD-10-CM

## 2020-02-02 ENCOUNTER — Ambulatory Visit (INDEPENDENT_AMBULATORY_CARE_PROVIDER_SITE_OTHER): Payer: 59 | Admitting: Pharmacist

## 2020-02-02 ENCOUNTER — Other Ambulatory Visit: Payer: Self-pay

## 2020-02-02 VITALS — BP 124/80 | HR 66 | Resp 16 | Ht 64.0 in | Wt 182.4 lb

## 2020-02-02 DIAGNOSIS — I10 Essential (primary) hypertension: Secondary | ICD-10-CM

## 2020-02-02 MED ORDER — AMLODIPINE BESYLATE 5 MG PO TABS: 5.0000 mg | ORAL_TABLET | Freq: Two times a day (BID) | ORAL | 3 refills | Status: DC

## 2020-02-02 MED ORDER — HYDRALAZINE HCL 10 MG PO TABS: ORAL_TABLET | ORAL | 1 refills | Status: DC

## 2020-02-02 NOTE — Progress Notes (Signed)
HPI:  Marie Peterson is a 54 y.o. female patient of Dr Gwenlyn Found,  who presents today for hypertension follow up.  In addition to hypertension, her medial history is significant for migraine headaches and SLE.  At her initial CVRR visit it was noted that she has had migraines for many years, as well as arthritis and fibromyalgia.  For ease of use, her amlodipine and benazepril were combined into a single tablet at her last visit, but medication was discontinued by PCP d/t swelling. Patient also reported swelling with chlorthalidone 12.5 mg.  She continued using amlodipine and hydralazine at this time  Today she returns for follow up. She has been feeling well overall and has a list of home BP readings with her today.  Reports migraine HA up to 2 times per week.   Blood Pressure Goal:  130/80  Current Medications: Amlodipine 5mg  daily  hydralazine 20 mg twice daily (AM and PM)  Furosemide 40mg  1.5 tab in AM and 1 in PM  Family Hx: both parents with hypertension; father died earlier this year at 15 from cancer; mother living at 12, 2 children 73, 23 w/o issues, siblings also healthy.    Social Hx: no tobacco, no alcohol; 1 cup coffee in the mornings  Diet:  Mostly home cooked, with occasional take out; some added salt not often; mix of f/v (moslty fresh, occasional frozen); protein mainly chicken, lean ground beef, fish.   Exercise: activities of daily living  Home BP readings:  13 readings; average 139/79; HR range 46-54bpm  Intolerances: no cardiac medication intolerances  Labs:  10/2019:  Na 140, K 3.8, Glu 68, BUN 18, SCr 0.89   Wt Readings from Last 3 Encounters:  02/02/20 182 lb 6.4 oz (82.7 kg)  01/15/20 182 lb (82.6 kg)  12/09/19 179 lb 3.2 oz (81.3 kg)   BP Readings from Last 3 Encounters:  02/02/20 124/80  01/15/20 (!) 152/82  12/09/19 124/80   Pulse Readings from Last 3 Encounters:  02/02/20 66  01/15/20 (!) 53  12/09/19 66    Current Outpatient Medications   Medication Sig Dispense Refill  . AJOVY 225 MG/1.5ML SOSY INJECT 675MG  UNDER THE SKIN EVERY 3 MONTHS 4.5 mL 11  . alendronate (FOSAMAX) 70 MG tablet Take 1 tablet (70 mg total) by mouth every 7 (seven) days. Take with a full glass of water on an empty stomach. 4 tablet 5  . aspirin 81 MG tablet Take 81 mg by mouth daily.    . benzonatate (TESSALON) 100 MG capsule Take 100 mg by mouth 3 (three) times daily.    . Cholecalciferol (VITAMIN D PO) Take 2,000 Int'l Units by mouth daily.     . clotrimazole-betamethasone (LOTRISONE) cream Apply 1 application topically 2 (two) times daily. 30 g 0  . diclofenac sodium (VOLTAREN) 1 % GEL Apply topically.    [name] Kitchen FERREX 150 150 MG capsule Take 1 tablet by mouth daily.  1  . folic acid (FOLVITE) 643 MCG tablet Take 400 mcg by mouth daily.    . furosemide (LASIX) 40 MG tablet Take 1 tablet by mouth 2 (two) times daily. TAKE 1 TABLE TWICE DAILY.  0  . gabapentin (NEURONTIN) 600 MG tablet Take 600mg  in am, 600mg  at lunch and 1200mg  at bedtime 360 tablet 0  . hydrALAZINE (APRESOLINE) 10 MG tablet Take 20mg  twice daily as needed for SBP > 150 180 tablet 1  . hydroxychloroquine (PLAQUENIL) 200 MG tablet Take 200 mg by mouth 2 (  two) times daily.   0  . L-Methylfolate-B6-B12 (METANX PO) Take 1 tablet by mouth 2 (two) times daily.     [name] Kitchen lidocaine (LIDODERM) 5 % Place 3 patches onto the skin daily. Remove & Discard patch within 12 hours or as directed by MD (Patient taking differently: Place 2 patches onto the skin daily. Remove & Discard patch within 12 hours or as directed by MD For use as needed) 90 patch 6  . LORazepam (ATIVAN) 0.5 MG tablet Take 0.5 mg by mouth daily as needed.    . magnesium oxide (MAGNESIUM-OXIDE) 400 (241.3 Mg) MG tablet Take 1 tablet (400 mg total) by mouth daily. 90 tablet 3  . Multiple Vitamin (MULTIVITAMIN) tablet Take 1 tablet by mouth daily.    . naratriptan (AMERGE) 2.5 MG tablet Take 1 tablet (2.5 mg total) by mouth as needed for  migraine. Take one (1) tablet at onset of headache; if returns or does not resolve, may repeat after 4 hours; do not exceed five (5) mg in 24 hours. 10 tablet 11  . Omega-3 Fatty Acids (FISH OIL) 1000 MG CAPS Take 2 capsules by mouth daily.    [name] Kitchen omeprazole (PRILOSEC) 40 MG capsule Take 40 mg by mouth daily.  1  . ondansetron (ZOFRAN-ODT) 4 MG disintegrating tablet DISSOLVE 1 TABLET ON THE TONGUE EVERY 8 HOURS AS NEEDED FOR NAUSEA OR VOMITING 20 tablet 11  . oxyCODONE-acetaminophen (PERCOCET) 7.5-325 MG tablet Take 1 tablet by mouth every 8 (eight) hours.    . potassium chloride SA (KLOR-CON) 20 MEQ tablet TAKE 1 TABLET(20 MEQ) BY MOUTH DAILY 90 tablet 3  . predniSONE (DELTASONE) 5 MG tablet Take 5 mg by mouth every 3 (three) days. Take one tablet every 3 days    . Rimegepant Sulfate (NURTEC) 75 MG TBDP Take 75 mg by mouth daily as needed (take for abortive therapy of migraine, no more than 1 tablet in 24 hours or 10 per month). 10 tablet 11  . sertraline (ZOLOFT) 100 MG tablet sertraline 100 mg tablet  TAKE 1 TABLET BY MOUTH EVERY DAY    . Suvorexant (BELSOMRA) 20 MG TABS Take 20 mg by mouth Nightly. Take one tablet at bedtime daily    . tolterodine (DETROL LA) 4 MG 24 hr capsule Take 4 mg by mouth daily.    [name] Kitchen XIIDRA 5 % SOLN Place 1 drop into both eyes 2 (two) times daily.    [name] Kitchen amLODipine (NORVASC) 5 MG tablet Take 1 tablet (5 mg total) by mouth in the morning and at bedtime. 60 tablet 3   No current facility-administered medications for this visit.    Allergies  Allergen Reactions  . Ubrogepant Swelling    Lip swelling   . Benazepril     Lip swelling  . Cymbalta [Duloxetine Hcl]     Lip swelling  . Hygroton [Chlorthalidone] Swelling    Swelling in lips  . Myrbetriq [Mirabegron] Itching    Past Medical History:  Diagnosis Date  . Arthritis   . Atrophic vaginitis   . Atypical chest pain    thought to be GERD; myoview 05/17/07-no significant ischemia; echo 07/20/11- normal EF=>55%   . Baker's cyst 03/28/12   venous doppler 03/28/12=no thrombus; baker's cyst at left popliteal fossa  . Chest pain   . CIN I (cervical intraepithelial neoplasia I) 1990  . Degenerative disc disease, cervical 2020   severe, C3-4, 4-5, 5-6. Currently receiving injections by Dr. Vira Blanco of preferred pain management  . Fibroid   .  Fibromyalgia   . Hypertension   . Lupus nephritis ([place])   . Migraines   . Neuropathy   . Osteopenia 09/2017   T score -2.0 improved from prior study  . Premature menopause age 26   following chemotherapy for Lupus  . Shingles     Blood pressure 124/80, pulse 66, resp. rate 16, height 5\' 4"  (1.626 m), weight 182 lb 6.4 oz (82.7 kg), SpO2 97 %.  Essential hypertension Blood pressure is well controlled today. Patient was unable to tolerate amlodipine/benazeporil combination and is currently taking amlodipine 5mg , hydralazine and furosemide.   Will decrease furosemide to 40mg  BID, increase amlodipine to 5mg  BID, and change hydralazine to 20mg  BID PRN. Follow up scheduled for 4 weeks.    Inika Bellanger Rodriguez-Guzman PharmD, BCPS, [place] [address] 02/09/2020 7:09 PM

## 2020-02-02 NOTE — Patient Instructions (Addendum)
Return for a ] follow up appointment in 8 weeks  Check your blood pressure at home daily (if able) and keep record of the readings.  Take your BP meds as follows: *Decrease furosemide to 1 tablet twice daily *INCREASE amlodipine to 5mg  twice daily *CHANGE hydralazine to 20mg  twice daily if needed for BP above 150  *TRY to sleep at least 6 hours per night*  Bring all of your meds, your BP cuff and your record of home blood pressures to your next appointment.  Exercise as you're able, try to walk approximately 30 minutes per day.  Keep salt intake to a minimum, especially watch canned and prepared boxed foods.  Eat more fresh fruits and vegetables and fewer canned items.  Avoid eating in fast food restaurants.    HOW TO TAKE YOUR BLOOD PRESSURE: . Rest 5 minutes before taking your blood pressure. .  Don't smoke or drink caffeinated beverages for at least 30 minutes before. . Take your blood pressure before (not after) you eat. . Sit comfortably with your back supported and both feet on the floor (don't cross your legs). . Elevate your arm to heart level on a table or a desk. . Use the proper sized cuff. It should fit smoothly and snugly around your bare upper arm. There should be enough room to slip a fingertip under the cuff. The bottom edge of the cuff should be 1 inch above the crease of the elbow. . Ideally, take 3 measurements at one sitting and record the average.

## 2020-02-09 ENCOUNTER — Encounter: Payer: Self-pay | Admitting: Pharmacist

## 2020-02-09 NOTE — Assessment & Plan Note (Signed)
Blood pressure is well controlled today. Patient was unable to tolerate amlodipine/benazeporil combination and is currently taking amlodipine 5mg , hydralazine and furosemide.   Will decrease furosemide to 40mg  BID, increase amlodipine to 5mg  BID, and change hydralazine to 20mg  BID PRN. Follow up scheduled for 4 weeks.

## 2020-02-11 ENCOUNTER — Other Ambulatory Visit: Payer: Self-pay | Admitting: Cardiovascular Disease

## 2020-02-18 ENCOUNTER — Ambulatory Visit (INDEPENDENT_AMBULATORY_CARE_PROVIDER_SITE_OTHER): Payer: 59 | Admitting: *Deleted

## 2020-02-18 DIAGNOSIS — G43009 Migraine without aura, not intractable, without status migrainosus: Secondary | ICD-10-CM | POA: Diagnosis not present

## 2020-02-18 NOTE — Progress Notes (Signed)
Patientwasgiven her 3 scheduled injections of Ajovy 225 mg each. Total 675 mg. She brought these from home. They were administered inRUQofabdomen. Pt tolerated wellandaseptic technique used. Next injections due on 05/21/2019  BDZ:[id number], QAS:[id number]

## 2020-02-27 ENCOUNTER — Ambulatory Visit: Payer: 59 | Attending: Internal Medicine

## 2020-02-27 DIAGNOSIS — Z23 Encounter for immunization: Secondary | ICD-10-CM

## 2020-02-27 NOTE — Progress Notes (Signed)
   Covid-19 Vaccination Clinic  Name:  Marie Peterson    MRN: [id number] DOB: 1965/12/20  02/27/2020  Marie Peterson was observed post Covid-19 immunization for 15 minutes without incident. She was provided with Vaccine Information Sheet and instruction to access the V-Safe system.   Marie Peterson was instructed to call 911 with any severe reactions post vaccine: [name] Kitchen Difficulty breathing  . Swelling of face and throat  . A fast heartbeat  . A bad rash all over body  . Dizziness and weakness   Immunizations Administered    Name Date Dose VIS Date Route   Pfizer COVID-19 Vaccine 02/27/2020  1:39 PM 0.3 mL 01/14/2020 Intramuscular   Manufacturer: [place]   Lot: [id number]   NDC: [address]-1

## 2020-03-25 ENCOUNTER — Other Ambulatory Visit: Payer: Self-pay | Admitting: Cardiovascular Disease

## 2020-03-29 ENCOUNTER — Ambulatory Visit: Payer: 59

## 2020-04-01 DIAGNOSIS — R600 Localized edema: Secondary | ICD-10-CM | POA: Diagnosis not present

## 2020-04-01 DIAGNOSIS — M797 Fibromyalgia: Secondary | ICD-10-CM | POA: Diagnosis not present

## 2020-04-02 ENCOUNTER — Other Ambulatory Visit: Payer: Self-pay | Admitting: Obstetrics and Gynecology

## 2020-04-14 DIAGNOSIS — M47817 Spondylosis without myelopathy or radiculopathy, lumbosacral region: Secondary | ICD-10-CM | POA: Diagnosis not present

## 2020-04-19 NOTE — Progress Notes (Signed)
HPI:  Marie Peterson is a 55 y.o. female patient of Dr Gwenlyn Found,  who presents today for hypertension follow up.  In addition to hypertension, her medial history is significant for migraine headaches and SLE.  At her initial [facility] visit it was noted that she has had migraines for many years, as well as arthritis and fibromyalgia. She is currently undergoing management for chronic pain. Her gabapentin was stopped and changed to lyrica 3 weeks ago. Patient reports better pain control since medication change. Denies issues with current therapy.  Blood Pressure Goal:  130/80  Current Medications: Amlodipine 5mg  daily  hydralazine 20 mg twice daily AS NEEDED for SBP > 150 (none in the last 4 weeks) Furosemide 40mg  twice daily  Family Hx: both parents with hypertension; father died earlier this year at 28 from cancer; mother living at 25, 2 children 95, 7 w/o issues, siblings also healthy.    Social Hx: no tobacco, no alcohol; 1 cup coffee in the mornings  Diet:  Mostly home cooked, with occasional take out; some added salt not often; mix of f/v (moslty fresh, occasional frozen); protein mainly chicken, lean ground beef, fish.   Exercise: activities of daily living  Home BP readings:  27 readings; average 124/77 ; HR range 47-62bpm  Intolerances: no cardiac medication intolerances  Labs:  10/2019:  Na 140, K 3.8, Glu 68, BUN 18, SCr 0.89   Wt Readings from Last 3 Encounters:  04/20/20 189 lb 3.2 oz (85.8 kg)  02/02/20 182 lb 6.4 oz (82.7 kg)  01/15/20 182 lb (82.6 kg)   BP Readings from Last 3 Encounters:  04/20/20 110/78  02/02/20 124/80  01/15/20 (!) 152/82   Pulse Readings from Last 3 Encounters:  04/20/20 (!) 58  02/02/20 66  01/15/20 (!) 53    Current Outpatient Medications  Medication Sig Dispense Refill  . AJOVY 225 MG/1.5ML SOSY INJECT 675MG  UNDER THE SKIN EVERY 3 MONTHS 4.5 mL 11  . alendronate (FOSAMAX) 70 MG tablet TAKE 1 TABLET(70 MG) BY MOUTH EVERY 7 DAYS WITH A  FULL GLASS OF WATER AND ON AN EMPTY STOMACH 4 tablet 5  . amLODipine (NORVASC) 5 MG tablet Take 1 tablet (5 mg total) by mouth in the morning and at bedtime. 60 tablet 3  . aspirin 81 MG tablet Take 81 mg by mouth daily.    . benzonatate (TESSALON) 100 MG capsule Take 100 mg by mouth 3 (three) times daily.    . Cholecalciferol (VITAMIN D PO) Take 2,000 Int'l Units by mouth daily.     . clotrimazole-betamethasone (LOTRISONE) cream Apply 1 application topically 2 (two) times daily. 30 g 0  . diclofenac sodium (VOLTAREN) 1 % GEL Apply topically.    [name] Kitchen FERREX 150 150 MG capsule Take 1 tablet by mouth daily.  1  . folic acid (FOLVITE) 696 MCG tablet Take 400 mcg by mouth daily.    . furosemide (LASIX) 40 MG tablet Take 1 tablet by mouth 2 (two) times daily. TAKE 1 TABLE TWICE DAILY.  0  . hydroxychloroquine (PLAQUENIL) 200 MG tablet Take 200 mg by mouth 2 (two) times daily.   0  . L-Methylfolate-B6-B12 (METANX PO) Take 1 tablet by mouth 2 (two) times daily.     [name] Kitchen lidocaine (LIDODERM) 5 % Place 3 patches onto the skin daily. Remove & Discard patch within 12 hours or as directed by MD (Patient taking differently: Place 2 patches onto the skin daily. Remove & Discard patch within 12  hours or as directed by MD For use as needed) 90 patch 6  . LORazepam (ATIVAN) 0.5 MG tablet Take 0.5 mg by mouth daily as needed.    . magnesium oxide (MAGNESIUM-OXIDE) 400 (241.3 Mg) MG tablet Take 1 tablet (400 mg total) by mouth daily. 90 tablet 3  . Multiple Vitamin (MULTIVITAMIN) tablet Take 1 tablet by mouth daily.    . naratriptan (AMERGE) 2.5 MG tablet Take 1 tablet (2.5 mg total) by mouth as needed for migraine. Take one (1) tablet at onset of headache; if returns or does not resolve, may repeat after 4 hours; do not exceed five (5) mg in 24 hours. 10 tablet 11  . Omega-3 Fatty Acids (FISH OIL) 1000 MG CAPS Take 2 capsules by mouth daily.    [name] Kitchen omeprazole (PRILOSEC) 40 MG capsule Take 40 mg by mouth daily.  1  .  ondansetron (ZOFRAN-ODT) 4 MG disintegrating tablet DISSOLVE 1 TABLET ON THE TONGUE EVERY 8 HOURS AS NEEDED FOR NAUSEA OR VOMITING 20 tablet 11  . oxyCODONE-acetaminophen (PERCOCET) 7.5-325 MG tablet Take 1 tablet by mouth every 8 (eight) hours.    . potassium chloride SA (KLOR-CON) 20 MEQ tablet TAKE 1 TABLET(20 MEQ) BY MOUTH DAILY 90 tablet 3  . predniSONE (DELTASONE) 5 MG tablet Take 5 mg by mouth every 3 (three) days. Take one tablet every 3 days    . pregabalin (LYRICA) 200 MG capsule Take 200 mg by mouth daily.    . Rimegepant Sulfate (NURTEC) 75 MG TBDP Take 75 mg by mouth daily as needed (take for abortive therapy of migraine, no more than 1 tablet in 24 hours or 10 per month). 10 tablet 11  . sertraline (ZOLOFT) 100 MG tablet sertraline 100 mg tablet  TAKE 1 TABLET BY MOUTH EVERY DAY    . Suvorexant (BELSOMRA) 20 MG TABS Take 20 mg by mouth Nightly. Take one tablet at bedtime daily    . tolterodine (DETROL LA) 4 MG 24 hr capsule Take 4 mg by mouth daily.    [name] Kitchen XIIDRA 5 % SOLN Place 1 drop into both eyes 2 (two) times daily.    . hydrALAZINE (APRESOLINE) 10 MG tablet TAKE 2 TABLETS(20 MG) BY MOUTH TWICE DAILY AS NEEDED FOR SYSTOLIC BLOOD PRESSURE OVER 150 (Patient not taking: Reported on 04/20/2020) 180 tablet 1   No current facility-administered medications for this visit.    Allergies  Allergen Reactions  . Ubrogepant Swelling    Lip swelling   . Benazepril     Lip swelling  . Cymbalta [Duloxetine Hcl]     Lip swelling  . Hygroton [Chlorthalidone] Swelling    Swelling in lips  . Myrbetriq [Mirabegron] Itching    Past Medical History:  Diagnosis Date  . Arthritis   . Atrophic vaginitis   . Atypical chest pain    thought to be GERD; myoview 05/17/07-no significant ischemia; echo 07/20/11- normal EF=>55%  . Baker's cyst 03/28/12   venous doppler 03/28/12=no thrombus; baker's cyst at left popliteal fossa  . Chest pain   . CIN I (cervical intraepithelial neoplasia I) 1990  .  Degenerative disc disease, cervical 2020   severe, C3-4, 4-5, 5-6. Currently receiving injections by Dr. Vira Blanco of preferred pain management  . Fibroid   . Fibromyalgia   . Hypertension   . Lupus nephritis ([place])   . Migraines   . Neuropathy   . Osteopenia 09/2017   T score -2.0 improved from prior study  . Premature menopause age 6  following chemotherapy for Lupus  . Shingles     Blood pressure 110/78, pulse (!) 58, weight 189 lb 3.2 oz (85.8 kg).  Essential hypertension Blood pressure controlled during OV. Noted home BP average of 124/77l. Patient denies problem with current therapy and reports compliance. Her pain management was adjusted and now taking Lyrica 200mg  instead of gabapentin.   Will continue current medication without changes and follow up as needed.   Kurtis Anastasia Rodriguez-Guzman PharmD, BCPS, [place] [address] 04/20/2020 8:56 AM

## 2020-04-20 ENCOUNTER — Other Ambulatory Visit: Payer: Self-pay

## 2020-04-20 ENCOUNTER — Ambulatory Visit (INDEPENDENT_AMBULATORY_CARE_PROVIDER_SITE_OTHER): Payer: 59 | Admitting: Pharmacist

## 2020-04-20 ENCOUNTER — Encounter: Payer: Self-pay | Admitting: Pharmacist

## 2020-04-20 VITALS — BP 110/78 | HR 58 | Wt 189.2 lb

## 2020-04-20 DIAGNOSIS — I10 Essential (primary) hypertension: Secondary | ICD-10-CM

## 2020-04-20 NOTE — Assessment & Plan Note (Signed)
Blood pressure controlled during OV. Noted home BP average of 124/77l. Patient denies problem with current therapy and reports compliance. Her pain management was adjusted and now taking Lyrica 200mg  instead of gabapentin.   Will continue current medication without changes and follow up as needed.

## 2020-04-20 NOTE — Patient Instructions (Signed)
Return for a  follow up appointment in  AS NEEDED  Check your blood pressure at home daily (if able) and keep record of the readings.  Take your BP meds as follows: *NO MEDICATION CHANGE*  Bring all of your meds, your BP cuff and your record of home blood pressures to your next appointment.  Exercise as you're able, try to walk approximately 30 minutes per day.  Keep salt intake to a minimum, especially watch canned and prepared boxed foods.  Eat more fresh fruits and vegetables and fewer canned items.  Avoid eating in fast food restaurants.    HOW TO TAKE YOUR BLOOD PRESSURE: . Rest 5 minutes before taking your blood pressure. .  Don't smoke or drink caffeinated beverages for at least 30 minutes before. . Take your blood pressure before (not after) you eat. . Sit comfortably with your back supported and both feet on the floor (don't cross your legs). . Elevate your arm to heart level on a table or a desk. . Use the proper sized cuff. It should fit smoothly and snugly around your bare upper arm. There should be enough room to slip a fingertip under the cuff. The bottom edge of the cuff should be 1 inch above the crease of the elbow. . Ideally, take 3 measurements at one sitting and record the average.    

## 2020-05-04 DIAGNOSIS — Z79899 Other long term (current) drug therapy: Secondary | ICD-10-CM | POA: Diagnosis not present

## 2020-05-14 ENCOUNTER — Telehealth: Payer: Self-pay | Admitting: Cardiovascular Disease

## 2020-05-14 NOTE — Telephone Encounter (Signed)
Returned call to patient of Dr. Gwenlyn Found who reports LE edema, weight gain, worsening SOB, more fatigue  She has had some weight gain over the last 2 weeks She is taking lasix as prescribed She has not been seen in over 6 months by provider - just PharmD for HTN Scheduled visit with [facility] on 05/18/20 Advised that she bring with her a log of her weights, BP/pulse

## 2020-05-14 NOTE — Telephone Encounter (Signed)
Pt c/o swelling: STAT is pt has developed SOB within 24 hours  1) How much weight have you gained and in what time span? 6.2 lbs in about 2 weeks  2) If swelling, where is the swelling located? Legs and feet  3) Are you currently taking a fluid pill? Yes, 2 furosemide 40 mg in the morning and 1 furosemide 40 mg in the evening  4) Are you currently SOB? No   5) Do you have a log of your daily weights (if so, list)?  01/29: 185.2 lbs 02/13: 191.4  6) Have you gained 3 pounds in a day or 5 pounds in a week? No   7) Have you traveled recently? No

## 2020-05-15 NOTE — Progress Notes (Signed)
Cardiology Office Note:    Date:  05/18/2020   ID:  Marie Peterson, DOB [birth year], MRN [id number]  PCP:  [name], MD  Cardiologist:  Quay Burow, MD  Electrophysiologist:  None   Referring MD: [name], MD   Chief Complaint: shortness of breath, weight gain, and lower extremity swelling  History of Present Illness:    Marie Peterson is a 55 y.o. female with a history of atypical chest pain with normal stress test in 10/2014, sinus bradycardia noted on monitor in 2018,  hypertension, hyperlipidemia, GERD, SLE with lupus nephritis, fibromyalgia, migraines, arthritis, and chronic pain who is followed by Dr. Gwenlyn Found and presents today for further evaluation of shortness of breath and weight gain.  Patient has history of atypical chest pain. She underwent ETT in 10/2014 which was normal. She was noted to be bradycardic in fall of 2018. Monitor in 12/2016 showed sinus bradycardia with rates in the 40's to 60's. Echo showed LVEF of 60-65% with normal wall motion, normal diastolic function, trace MR, and trace TR. She had some fatigue with this but was otherwise asymptomatic. Patient was last seen by Dr. Gwenlyn Found in 07/2019 at which time she reported being under a lot of stress at home but denied any chest pain or shortness of breath. Her BP was mildly elevated and her Amlodipine was increased. Since this time, she has been seen multiple times in the [facility] clinic. She was last seen by Raquel Rodriquez-Guzman, RPH-CPP, on 04/20/2020 at which time her BP was well controlled. Average home BP reading 124/77 with heart rates ranging from 47-62 bpm. She was continued on current home medications which include Amlodipine 5mg  daily and Hydralazine 20mg  twice daily as needed for systolic BP >322.  Patient called our office on 05/14/2020 with complaints of worsening shortness of breath, lower extremity edema, weight gain, and more fatigue. Therefore, this visit was scheduled for further evaluation.    Patient reports she saw her PCP about 1 month ago for evaluation of weight gain and increased lower extremity edema. Her PCP increased her Lasix to 80mg  in the AM and 40mg  in the PM for 5 days and then she was instructed to return to her usual dose of 60mg  in the AM and 40mg  in the PM. She states the higher dose did seem to help her weight and edema. However, when she went back to usual dose, he weight started to increase again and edema worsened. Left leg also slightly worse than right. She called her PCP about 2 weeks ago and she increased the Lasix back to higher dose. However, her weight has stayed up around 187 lbs to 188 lbs on home scale. Patient states she has gained about 30 lbs over the last year dealing with the stress from separating from her partner and the death of her father. She does not think all of this is from fluid and I agree. Looks recent dry weight is around 180 lbs. She is 199lbs on scales in our office but was 188 lbs on her home scales. She reports dyspnea on exertion but denies any significant shortness of breath at rest. No orthopnea or PND. She also notes some chest heaviness with exertion which always occurs after she gets short of breath. No other chest discomfort. No palpitations, lightheadedness, dizziness, or near syncope/syncope.  Past Medical History:  Diagnosis Date  . Arthritis   . Atrophic vaginitis   . Atypical chest pain    thought to be GERD; myoview 05/17/07-no  significant ischemia; echo 07/20/11- normal EF=>55%  . Baker's cyst 03/28/12   venous doppler 03/28/12=no thrombus; baker's cyst at left popliteal fossa  . Chest pain   . CIN I (cervical intraepithelial neoplasia I) 1990  . Degenerative disc disease, cervical 2020   severe, C3-4, 4-5, 5-6. Currently receiving injections by Dr. Vira Blanco of preferred pain management  . Fibroid   . Fibromyalgia   . Hypertension   . Lupus nephritis ([place])   . Migraines   . Neuropathy   . Osteopenia 09/2017   T score -2.0  improved from prior study  . Premature menopause age 65   following chemotherapy for Lupus  . Shingles     Past Surgical History:  Procedure Laterality Date  . cervical fossa injections  2020  . CESAREAN SECTION    . COLPOSCOPY    . HYSTEROSCOPY     Myomectomy  . MYOMECTOMY     Hysteroscopic myomectomy  . NASAL SINUS SURGERY  01/2016  . thumb surg  07/07/2019  . TUBAL LIGATION      Current Medications: Current Meds  Medication Sig  . AJOVY 225 MG/1.5ML SOSY INJECT $RemoveBef'675MG'[id number]$  UNDER THE SKIN EVERY 3 MONTHS  . alendronate (FOSAMAX) 70 MG tablet TAKE 1 TABLET(70 MG) BY MOUTH EVERY 7 DAYS WITH A FULL GLASS OF WATER AND ON AN EMPTY STOMACH  . aspirin 81 MG tablet Take 81 mg by mouth daily.  . benzonatate (TESSALON) 100 MG capsule Take 100 mg by mouth 3 (three) times daily.  . Cholecalciferol (VITAMIN D PO) Take 2,000 Int'l Units by mouth daily.   . clotrimazole-betamethasone (LOTRISONE) cream Apply 1 application topically 2 (two) times daily.  . diclofenac sodium (VOLTAREN) 1 % GEL Apply topically.  [name] Kitchen FERREX 150 150 MG capsule Take 1 tablet by mouth daily.  . folic acid (FOLVITE) 716 MCG tablet Take 400 mcg by mouth daily.  . furosemide (LASIX) 80 MG tablet Take 1 tablet (80 mg total) by mouth daily.  . hydrALAZINE (APRESOLINE) 10 MG tablet TAKE 2 TABLETS(20 MG) BY MOUTH TWICE DAILY AS NEEDED FOR SYSTOLIC BLOOD PRESSURE OVER 150  . hydroxychloroquine (PLAQUENIL) 200 MG tablet Take 200 mg by mouth 2 (two) times daily.   [name] Kitchen L-Methylfolate-B6-B12 (METANX PO) Take 1 tablet by mouth 2 (two) times daily.   [name] Kitchen lidocaine (LIDODERM) 5 % Place 3 patches onto the skin daily. Remove & Discard patch within 12 hours or as directed by MD  . LORazepam (ATIVAN) 0.5 MG tablet Take 0.5 mg by mouth daily as needed.  . magnesium oxide (MAGNESIUM-OXIDE) 400 (241.3 Mg) MG tablet Take 1 tablet (400 mg total) by mouth daily.  . Multiple Vitamin (MULTIVITAMIN) tablet Take 1 tablet by mouth daily.  . naratriptan  (AMERGE) 2.5 MG tablet Take 1 tablet (2.5 mg total) by mouth as needed for migraine. Take one (1) tablet at onset of headache; if returns or does not resolve, may repeat after 4 hours; do not exceed five (5) mg in 24 hours.  . Omega-3 Fatty Acids (FISH OIL) 1000 MG CAPS Take 2 capsules by mouth daily.  [name] Kitchen omeprazole (PRILOSEC) 40 MG capsule Take 40 mg by mouth daily.  . ondansetron (ZOFRAN-ODT) 4 MG disintegrating tablet DISSOLVE 1 TABLET ON THE TONGUE EVERY 8 HOURS AS NEEDED FOR NAUSEA OR VOMITING  . oxyCODONE-acetaminophen (PERCOCET) 7.5-325 MG tablet Take 1 tablet by mouth every 8 (eight) hours.  . potassium chloride SA (KLOR-CON) 20 MEQ tablet TAKE 1 TABLET(20 MEQ) BY MOUTH DAILY  . predniSONE (  DELTASONE) 5 MG tablet Take 5 mg by mouth every 3 (three) days. Take one tablet every 3 days  . pregabalin (LYRICA) 200 MG capsule Take 200 mg by mouth daily.  . Rimegepant Sulfate (NURTEC) 75 MG TBDP Take 75 mg by mouth daily as needed (take for abortive therapy of migraine, no more than 1 tablet in 24 hours or 10 per month).  . sertraline (ZOLOFT) 100 MG tablet sertraline 100 mg tablet  TAKE 1 TABLET BY MOUTH EVERY DAY  . Suvorexant (BELSOMRA) 20 MG TABS Take 20 mg by mouth Nightly. Take one tablet at bedtime daily  . tolterodine (DETROL LA) 4 MG 24 hr capsule Take 4 mg by mouth daily.  [name] Kitchen XIIDRA 5 % SOLN Place 1 drop into both eyes 2 (two) times daily.  . [DISCONTINUED] furosemide (LASIX) 40 MG tablet Take 1 tablet by mouth 2 (two) times daily. TAKE 1 TABLE TWICE DAILY.     Allergies:   Ubrogepant, Benazepril, Cymbalta [duloxetine hcl], Hygroton [chlorthalidone], and Mirabegron   Social History   Socioeconomic History  . Marital status: Married    Spouse name: Thereasa Distance  . Number of children: 2  . Years of education: College  . Highest education level: Not on file  Occupational History    Employer: DISABLED    Comment: disability  Tobacco Use  . Smoking status: Never Smoker  . Smokeless  tobacco: Never Used  Vaping Use  . Vaping Use: Never used  Substance and Sexual Activity  . Alcohol use: No    Alcohol/week: 0.0 standard drinks  . Drug use: No  . Sexual activity: Not Currently    Birth control/protection: Surgical, Post-menopausal    Comment: Tubal lig-1st intercourse 55 yo-Fewer than 5 partners  Other Topics Concern  . Not on file  Social History Narrative   Patient lives at home with her daughter. She is currently separated (not legally).   Caffeine Use: 1 cup of coffee daily.    Right-handed   Social Determinants of Corporate investment banker Strain: Not on file  Food Insecurity: Not on file  Transportation Needs: Not on file  Physical Activity: Not on file  Stress: Not on file  Social Connections: Not on file     Family History: The patient's family history includes Asthma in her mother; COPD in her mother; Cancer in her father; Diabetes in her father; Heart disease in her father; Hypertension in her father and mother.  ROS:   Please see the history of present illness.     EKGs/Labs/Other Studies Reviewed:    The following studies were reviewed today:  Exercise Tolerance Test 11/12/2014:  Upsloping ST segment depression ST segment depression of 0.5 mm was noted during stress, beginning at 12 minutes of stress, and returning to baseline after less than 1 minute of recovery.   Negative, adequate GXT with the patient exercising to a HR of 153 (90%APMHR) and demonstrating excellent exercise tolerance (15.3 met workload). There were occasional PAC's. Nondiagnostic upsloping ST depression was present in late stage 4 and 5 with rapid resolution in the immediate recovery period.  _______________  Echocardiogram 12/28/2016: Study Conclusions: - Left ventricle: The cavity size was normal. Wall thickness was  normal. Systolic function was normal. The estimated ejection  fraction was in the range of 60% to 65%. Wall motion was normal;  there were no  regional wall motion abnormalities. Left  ventricular diastolic function parameters were normal.   Impressions: - Definity used; normal LV systolc and diastolic  function; trace MR  and TR.   EKG:  EKG ordered today. EKG personally reviewed and demonstrates sinus bradycardia, rate 58 bpm, with minimal T wave inversions in lead III and biphasic T waves in lead V2. Normal axis. Normal PR and QRS intervals. QTc 428 ms.  Recent Labs: 08/08/2019: ALT 20 11/25/2019: BUN 20; Creatinine, Ser 0.89; Potassium 3.8; Sodium 140  Recent Lipid Panel    Component Value Date/Time   CHOL 191 08/08/2019 0853   TRIG 54 08/08/2019 0853   HDL 70 08/08/2019 0853   CHOLHDL 2.7 08/08/2019 0853   LDLCALC 111 (H) 08/08/2019 0853    Physical Exam:    Vital Signs: BP 126/82   Pulse (!) 58   Ht 5\' 4"  (1.626 m)   Wt 199 lb (90.3 kg)   SpO2 97%   BMI 34.16 kg/m     Wt Readings from Last 3 Encounters:  05/18/20 199 lb (90.3 kg)  04/20/20 189 lb 3.2 oz (85.8 kg)  02/02/20 182 lb 6.4 oz (82.7 kg)     General: 55 y.o. female in no acute distress. HEENT: Normocephalic and atraumatic. Sclera clear.  Neck: Supple. No JVD. Heart: Bradycardic with regular rhythm. Distinct S1 and S2. No murmurs, gallops, or rubs. Radial and distal pedal pulses 2+ and equal bilaterally. Lungs: No increased work of breathing. Clear to ausculation bilaterally. No wheezes, rhonchi, or rales.  Abdomen: Soft, non-distended, and non-tender to palpation.  Extremities: Mild lower extremity edema bilaterally (left leg slightly larger than right chronically).    Skin: Warm and dry. Neuro: Alert and oriented x3. No focal deficits. Psych: Normal affect. Responds appropriately.  Assessment:    1. Sinus bradycardia   2. Primary hypertension   3. Hyperlipidemia, unspecified hyperlipidemia type     Plan:    Dyspnea on Exertion Lower Extremity Edema Possible Acute Diastolic CHF - Patient reports dyspnea on exertion, weight gain,  and lower extremity edema. - LVEF of 60-65% on last Echo in 2018. - She does have mild lower extremity edema on exam but otherwise does not look volume overloaded. Weight is up.  - Will increase Lasix to 80mg  twice daily (currently taking 80mg  in the morning and 60mg  in the evening). May need to increase potassium supplement as well but will wait for BMET results today. - Will update Echo. - Will check BNP and BMET today. Will need another BMET in 1 week after increasing Lasix. - Discussed importance of daily weights and sodium/fluid restrictions. Patient to notify us if she notices 3 lbs weight gain in 1 day or 5lbs weight gain in 1 week. Also recommended compression stockings for lower extremity edema.  Chest Heaviness - Patient reports chest heaviness with exertion only after she gets short of breath. No other chest pain.  - EKG shows non-specific ST/T changes. - This does not sound like ACS/unstable angina. May be due to possible CHF. Discussed proceeding with ischemic evaluation now or waiting to see him increased Lasix helps. Patient would like to wait for now which I think is reasonable. Will follow-up with patient in 1 month. If she is still having chest discomfort at that time, will discuss coronary CTA. Patient advised to let us know if symptoms worsen before then though.  Sinus Bradycardia - Chronic and stable. - Not on any AV nodal agents.   Hypertension - BP well controlled.  - Continue current medications: Amlodipine 5mg  daily and Hydralazine 20mg  twice daily as needed for systolic BP >454.  Hyperlipidemia - Most recent  lipid panel in 07/2019: Total Cholesterol 191, Triglycerides 54, HDL 70, LDL 111.  - Lifestyle modifications with exercise and heart healthy diet were recommended at time of last check.  - Did not have time to discuss today due to acute issues above.   Disposition: Follow up with me in 1 month after Echo.   Medication Adjustments/Labs and Tests  Ordered: Current medicines are reviewed at length with the patient today.  Concerns regarding medicines are outlined above.  Orders Placed This Encounter  Procedures  . Brain natriuretic peptide  . Basic metabolic panel  . Basic metabolic panel  . EKG 12-Lead  . ECHOCARDIOGRAM COMPLETE   Meds ordered this encounter  Medications  . furosemide (LASIX) 80 MG tablet    Sig: Take 1 tablet (80 mg total) by mouth daily.    Dispense:  30 tablet    Refill:  1    Patient Instructions  Medication Instructions:  Start Lasix 80 mg ( 1 Tablet Daily) *If you need a refill on your cardiac medications before your next appointment, please call your pharmacy*   Lab Work: BMP,BNP If you have labs (blood work) drawn today and your tests are completely normal, you will receive your results only by: [name] Kitchen MyChart Message (if you have MyChart) OR . A paper copy in the mail If you have any lab test that is abnormal or we need to change your treatment, we will call you to review the results.   Testing/Procedures: [address], [place] has requested that you have an echocardiogram. Echocardiography is a painless test that uses sound waves to create images of your heart. It provides your doctor with information about the size and shape of your heart and how well your heart's chambers and valves are working. This procedure takes approximately one hour. There are no restrictions for this procedure.    Follow-Up: At [facility], you and your health needs are our priority.  As part of our continuing mission to provide you with exceptional heart care, we have created designated Provider Care Teams.  These Care Teams include your primary Cardiologist (physician) and Advanced Practice Providers (APPs -  Physician Assistants and Nurse Practitioners) who all work together to provide you with the care you need, when you need it.  We recommend signing up for the patient portal called  "MyChart".  Sign up information is provided on this After Visit Summary.  MyChart is used to connect with patients for Virtual Visits (Telemedicine).  Patients are able to view lab/test results, encounter notes, upcoming appointments, etc.  Non-urgent messages can be sent to your provider as well.   To learn more about what you can do with MyChart, go to [email or web address].    Your next appointment:   July 05, 2020 8:45 AM  The format for your next appointment:   In Person  Provider:   Sande Rives PA-C   Other Instructions Heart Failure Education:  Weigh yourself EVERY morning after you go to the bathroom but before you eat or drink anything. Write this number down in a weight log/diary. If you gain 3 pounds overnight or 5 pounds in a week, call the office. Take your medicines as prescribed. If you have concerns about your medications, please call us before you stop taking them. Eat low salt foods--Limit salt (sodium) to 2000 mg per day. This will help prevent your body from holding onto fluid. Read food labels as many processed foods have a lot of sodium,  especially canned goods and prepackaged meats. If you would like some assistance choosing low sodium foods, we would be happy to set you up with a nutritionist. Limit all fluids for the day to less than 2 liters (64 ounces). Fluid includes all drinks, coffee, juice, ice chips, soup, jello, and all other liquids. Stay as active as you can everyday. Staying active will give you more energy and make your muscles stronger. Start with 5 minutes at a time and work your way up to 30 minutes a day. Break up your activities--do some in the morning and some in the afternoon. Start with 3 days per week and work your way up to 5 days as you can.  If you have chest pain, feel short of breath, dizzy, or lightheaded, STOP. If you don't feel better after a short rest, call 911. If you do feel better, call the office to let us know you have symptoms  with exercise.      Signed, Darreld Menees, PA-C  05/18/2020 10:14 AM    [place]

## 2020-05-18 ENCOUNTER — Ambulatory Visit (INDEPENDENT_AMBULATORY_CARE_PROVIDER_SITE_OTHER): Payer: 59 | Admitting: Student

## 2020-05-18 ENCOUNTER — Encounter: Payer: Self-pay | Admitting: Student

## 2020-05-18 ENCOUNTER — Other Ambulatory Visit: Payer: Self-pay

## 2020-05-18 VITALS — BP 126/82 | HR 58 | Ht 64.0 in | Wt 199.0 lb

## 2020-05-18 DIAGNOSIS — R001 Bradycardia, unspecified: Secondary | ICD-10-CM

## 2020-05-18 DIAGNOSIS — R0609 Other forms of dyspnea: Secondary | ICD-10-CM

## 2020-05-18 DIAGNOSIS — R0789 Other chest pain: Secondary | ICD-10-CM | POA: Diagnosis not present

## 2020-05-18 DIAGNOSIS — I10 Essential (primary) hypertension: Secondary | ICD-10-CM

## 2020-05-18 DIAGNOSIS — R600 Localized edema: Secondary | ICD-10-CM | POA: Diagnosis not present

## 2020-05-18 DIAGNOSIS — E785 Hyperlipidemia, unspecified: Secondary | ICD-10-CM

## 2020-05-18 DIAGNOSIS — R0600 Dyspnea, unspecified: Secondary | ICD-10-CM | POA: Diagnosis not present

## 2020-05-18 MED ORDER — FUROSEMIDE 80 MG PO TABS: 80.0000 mg | ORAL_TABLET | Freq: Every day | ORAL | 1 refills | Status: DC

## 2020-05-18 NOTE — Patient Instructions (Signed)
Medication Instructions:  Start Lasix 80 mg ( 1 Tablet Daily) *If you need a refill on your cardiac medications before your next appointment, please call your pharmacy*   Lab Work: BMP,BNP If you have labs (blood work) drawn today and your tests are completely normal, you will receive your results only by: [name] Kitchen MyChart Message (if you have MyChart) OR . A paper copy in the mail If you have any lab test that is abnormal or we need to change your treatment, we will call you to review the results.   Testing/Procedures: [address] [place] has requested that you have an echocardiogram. Echocardiography is a painless test that uses sound waves to create images of your heart. It provides your doctor with information about the size and shape of your heart and how well your heart's chambers and valves are working. This procedure takes approximately one hour. There are no restrictions for this procedure.    Follow-Up: At [facility], you and your health needs are our priority.  As part of our continuing mission to provide you with exceptional heart care, we have created designated Provider Care Teams.  These Care Teams include your primary Cardiologist (physician) and Advanced Practice Providers (APPs -  Physician Assistants and Nurse Practitioners) who all work together to provide you with the care you need, when you need it.  We recommend signing up for the patient portal called "MyChart".  Sign up information is provided on this After Visit Summary.  MyChart is used to connect with patients for Virtual Visits (Telemedicine).  Patients are able to view lab/test results, encounter notes, upcoming appointments, etc.  Non-urgent messages can be sent to your provider as well.   To learn more about what you can do with MyChart, go to [email or web address].    Your next appointment:   July 05, 2020 8:45 AM  The format for your next appointment:   In Person  Provider:    Sande Rives PA-C   Other Instructions Heart Failure Education:  Weigh yourself EVERY morning after you go to the bathroom but before you eat or drink anything. Write this number down in a weight log/diary. If you gain 3 pounds overnight or 5 pounds in a week, call the office. Take your medicines as prescribed. If you have concerns about your medications, please call us before you stop taking them. Eat low salt foods--Limit salt (sodium) to 2000 mg per day. This will help prevent your body from holding onto fluid. Read food labels as many processed foods have a lot of sodium, especially canned goods and prepackaged meats. If you would like some assistance choosing low sodium foods, we would be happy to set you up with a nutritionist. Limit all fluids for the day to less than 2 liters (64 ounces). Fluid includes all drinks, coffee, juice, ice chips, soup, jello, and all other liquids. Stay as active as you can everyday. Staying active will give you more energy and make your muscles stronger. Start with 5 minutes at a time and work your way up to 30 minutes a day. Break up your activities--do some in the morning and some in the afternoon. Start with 3 days per week and work your way up to 5 days as you can.  If you have chest pain, feel short of breath, dizzy, or lightheaded, STOP. If you don't feel better after a short rest, call 911. If you do feel better, call the office to let us know you  have symptoms with exercise.

## 2020-05-19 DIAGNOSIS — M47817 Spondylosis without myelopathy or radiculopathy, lumbosacral region: Secondary | ICD-10-CM | POA: Diagnosis not present

## 2020-05-19 DIAGNOSIS — Z79891 Long term (current) use of opiate analgesic: Secondary | ICD-10-CM | POA: Diagnosis not present

## 2020-05-19 DIAGNOSIS — G894 Chronic pain syndrome: Secondary | ICD-10-CM | POA: Diagnosis not present

## 2020-05-19 DIAGNOSIS — Z79899 Other long term (current) drug therapy: Secondary | ICD-10-CM | POA: Diagnosis not present

## 2020-05-19 LAB — BASIC METABOLIC PANEL
BUN/Creatinine Ratio: 15 (ref 9–23)
BUN: 12 mg/dL (ref 6–24)
CO2: 27 mmol/L (ref 20–29)
Calcium: 9.4 mg/dL (ref 8.7–10.2)
Chloride: 100 mmol/L (ref 96–106)
Creatinine, Ser: 0.8 mg/dL (ref 0.57–1.00)
GFR calc Af Amer: 96 mL/min/{1.73_m2} (ref >59)
GFR calc non Af Amer: 83 mL/min/{1.73_m2} (ref >59)
Glucose: 83 mg/dL (ref 65–99)
Potassium: 3.8 mmol/L (ref 3.5–5.2)
Sodium: 141 mmol/L (ref 134–144)

## 2020-05-19 LAB — BRAIN NATRIURETIC PEPTIDE: BNP: 70.2 pg/mL (ref 0.0–100.0)

## 2020-05-20 ENCOUNTER — Telehealth: Payer: Self-pay | Admitting: Student

## 2020-05-20 ENCOUNTER — Ambulatory Visit (INDEPENDENT_AMBULATORY_CARE_PROVIDER_SITE_OTHER): Payer: 59 | Admitting: *Deleted

## 2020-05-20 ENCOUNTER — Other Ambulatory Visit: Payer: Self-pay

## 2020-05-20 DIAGNOSIS — Z79899 Other long term (current) drug therapy: Secondary | ICD-10-CM

## 2020-05-20 DIAGNOSIS — G43009 Migraine without aura, not intractable, without status migrainosus: Secondary | ICD-10-CM

## 2020-05-20 DIAGNOSIS — R600 Localized edema: Secondary | ICD-10-CM

## 2020-05-20 MED ORDER — POTASSIUM CHLORIDE CRYS ER 20 MEQ PO TBCR: 20.0000 meq | EXTENDED_RELEASE_TABLET | Freq: Two times a day (BID) | ORAL | 3 refills | Status: DC

## 2020-05-20 MED ORDER — FUROSEMIDE 80 MG PO TABS: 80.0000 mg | ORAL_TABLET | Freq: Two times a day (BID) | ORAL | 1 refills | Status: DC

## 2020-05-20 NOTE — Progress Notes (Signed)
Patientwasgiven her 3 scheduled injections of Ajovy 225 mg each. Total 675 mg. She brought these from home. They were administered inRLQofabdomen. Pt tolerated wellandaseptic technique used. Next injections due on 08/18/2019. Pt scheduled for 8:30 AM.  NIO:[id number], KXF:[id number], NDC: [id number]

## 2020-05-20 NOTE — Telephone Encounter (Signed)
° °  Pt is returning call to get lab result °

## 2020-05-20 NOTE — Telephone Encounter (Signed)
Left a message for the patient to call back.  Please notify patient of results. All labs are normal. I think it is OK to continue Lasix 80mg  twice daily for now to see if this helps her symptoms. Let's increase potassium chloride to 59mEq twice daily while on higher dose of Lasix. She will need repeat BMET in 1 week.  Also can you update the Lasix in our system? It currently say 80mg  once daily but it should be twice daily.

## 2020-05-21 NOTE — Telephone Encounter (Signed)
Patient returning call to discuss lab results  ?

## 2020-05-24 ENCOUNTER — Other Ambulatory Visit: Payer: Self-pay

## 2020-05-24 DIAGNOSIS — I10 Essential (primary) hypertension: Secondary | ICD-10-CM

## 2020-05-24 DIAGNOSIS — Z79899 Other long term (current) drug therapy: Secondary | ICD-10-CM

## 2020-05-24 DIAGNOSIS — R600 Localized edema: Secondary | ICD-10-CM

## 2020-05-25 LAB — BASIC METABOLIC PANEL
BUN/Creatinine Ratio: 13 (ref 9–23)
BUN: 12 mg/dL (ref 6–24)
CO2: 22 mmol/L (ref 20–29)
Calcium: 9 mg/dL (ref 8.7–10.2)
Chloride: 103 mmol/L (ref 96–106)
Creatinine, Ser: 0.91 mg/dL (ref 0.57–1.00)
Glucose: 74 mg/dL (ref 65–99)
Potassium: 3.9 mmol/L (ref 3.5–5.2)
Sodium: 142 mmol/L (ref 134–144)
eGFR: 75 mL/min/{1.73_m2} (ref >59)

## 2020-05-27 ENCOUNTER — Other Ambulatory Visit: Payer: Self-pay

## 2020-05-27 DIAGNOSIS — R600 Localized edema: Secondary | ICD-10-CM

## 2020-06-03 ENCOUNTER — Other Ambulatory Visit: Payer: Self-pay

## 2020-06-03 DIAGNOSIS — R600 Localized edema: Secondary | ICD-10-CM

## 2020-06-03 LAB — BASIC METABOLIC PANEL
BUN/Creatinine Ratio: 14 (ref 9–23)
BUN: 11 mg/dL (ref 6–24)
CO2: 24 mmol/L (ref 20–29)
Calcium: 9 mg/dL (ref 8.7–10.2)
Chloride: 103 mmol/L (ref 96–106)
Creatinine, Ser: 0.81 mg/dL (ref 0.57–1.00)
Glucose: 69 mg/dL (ref 65–99)
Potassium: 4 mmol/L (ref 3.5–5.2)
Sodium: 142 mmol/L (ref 134–144)
eGFR: 86 mL/min/{1.73_m2} (ref >59)

## 2020-06-04 NOTE — Telephone Encounter (Signed)
Called patient to assess symptoms. Patient states her weight is down a pound from yesterday. Patient states that her swelling is better in her legs and feet and reports she found her compression stockings and has been wearing those and states that it has helped with the swelling. Patient denies any other symptoms at this time. Advised patient to continue doing what she is doing and to continue her current lasix dosage and to continue monitoring weight and salt intake. Advised patient to let us know how she is doing on Monday. Advised patient to call back to the office with any issues, questions, or concerns. Patient verbalized understanding.   Will forward message to Sande Rives PA to make her aware.

## 2020-06-07 NOTE — Telephone Encounter (Signed)
Thank you for the update!

## 2020-06-08 ENCOUNTER — Other Ambulatory Visit: Payer: Self-pay

## 2020-06-08 ENCOUNTER — Encounter (HOSPITAL_COMMUNITY): Payer: Self-pay

## 2020-06-08 ENCOUNTER — Ambulatory Visit (INDEPENDENT_AMBULATORY_CARE_PROVIDER_SITE_OTHER): Payer: 59

## 2020-06-08 ENCOUNTER — Ambulatory Visit (HOSPITAL_COMMUNITY)
Admission: EM | Admit: 2020-06-08 | Discharge: 2020-06-08 | Disposition: A | Discharge status: Home / Self Care | Payer: 59 | Attending: Urgent Care | Admitting: Urgent Care

## 2020-06-08 DIAGNOSIS — S52501A Unspecified fracture of the lower end of right radius, initial encounter for closed fracture: Secondary | ICD-10-CM

## 2020-06-08 DIAGNOSIS — W19XXXA Unspecified fall, initial encounter: Secondary | ICD-10-CM | POA: Diagnosis not present

## 2020-06-08 DIAGNOSIS — M25531 Pain in right wrist: Secondary | ICD-10-CM | POA: Diagnosis not present

## 2020-06-08 DIAGNOSIS — S52571A Other intraarticular fracture of lower end of right radius, initial encounter for closed fracture: Secondary | ICD-10-CM | POA: Diagnosis not present

## 2020-06-08 MED ORDER — HYDROCODONE-ACETAMINOPHEN 5-325 MG PO TABS: 1.0000 | ORAL_TABLET | Freq: Four times a day (QID) | ORAL | 0 refills | Status: DC | PRN

## 2020-06-08 MED ORDER — ACETAMINOPHEN 325 MG PO TABS
650.0000 mg | ORAL_TABLET | Freq: Once | ORAL | Status: AC
  Administered 2020-06-08: 650 mg via ORAL

## 2020-06-08 MED ORDER — ACETAMINOPHEN 325 MG PO TABS
ORAL_TABLET | ORAL | Status: AC
  Filled 2020-06-08: qty 2

## 2020-06-08 NOTE — ED Provider Notes (Signed)
[place]   MRN: [id number] DOB: 1965/11/20  Subjective:   Marie Peterson is a 55 y.o. female presenting for acute onset of right wrist pain, swelling, decreased ROM from suffering an accidental fall ~30 minutes ago. She came straight to our clinic. Has not taken any pain medications. Denies bruising, lacerations.    Current Facility-Administered Medications:  .  acetaminophen (TYLENOL) tablet 650 mg, 650 mg, Oral, Once, Jaynee Eagles, PA-C  Current Outpatient Medications:  .  AJOVY 225 MG/1.5ML SOSY, INJECT 675MG  UNDER THE SKIN EVERY 3 MONTHS, Disp: 4.5 mL, Rfl: 11 .  alendronate (FOSAMAX) 70 MG tablet, TAKE 1 TABLET(70 MG) BY MOUTH EVERY 7 DAYS WITH A FULL GLASS OF WATER AND ON AN EMPTY STOMACH, Disp: 4 tablet, Rfl: 5 .  amLODipine (NORVASC) 5 MG tablet, Take 1 tablet (5 mg total) by mouth in the morning and at bedtime., Disp: 60 tablet, Rfl: 3 .  aspirin 81 MG tablet, Take 81 mg by mouth daily., Disp: , Rfl:  .  benzonatate (TESSALON) 100 MG capsule, Take 100 mg by mouth 3 (three) times daily., Disp: , Rfl:  .  Cholecalciferol (VITAMIN D PO), Take 2,000 Int'l Units by mouth daily. , Disp: , Rfl:  .  clotrimazole-betamethasone (LOTRISONE) cream, Apply 1 application topically 2 (two) times daily., Disp: 30 g, Rfl: 0 .  diclofenac sodium (VOLTAREN) 1 % GEL, Apply topically., Disp: , Rfl:  .  FERREX 150 150 MG capsule, Take 1 tablet by mouth daily., Disp: , Rfl: 1 .  folic acid (FOLVITE) 970 MCG tablet, Take 400 mcg by mouth daily., Disp: , Rfl:  .  furosemide (LASIX) 80 MG tablet, Take 1 tablet (80 mg total) by mouth 2 (two) times daily., Disp: 30 tablet, Rfl: 1 .  hydrALAZINE (APRESOLINE) 10 MG tablet, TAKE 2 TABLETS(20 MG) BY MOUTH TWICE DAILY AS NEEDED FOR SYSTOLIC BLOOD PRESSURE OVER 150, Disp: 180 tablet, Rfl: 1 .  hydroxychloroquine (PLAQUENIL) 200 MG tablet, Take 200 mg by mouth 2 (two) times daily. , Disp: , Rfl: 0 .  L-Methylfolate-B6-B12 (METANX PO), Take 1  tablet by mouth 2 (two) times daily. , Disp: , Rfl:  .  lidocaine (LIDODERM) 5 %, Place 3 patches onto the skin daily. Remove & Discard patch within 12 hours or as directed by MD, Disp: 90 patch, Rfl: 6 .  LORazepam (ATIVAN) 0.5 MG tablet, Take 0.5 mg by mouth daily as needed., Disp: , Rfl:  .  magnesium oxide (MAGNESIUM-OXIDE) 400 (241.3 Mg) MG tablet, Take 1 tablet (400 mg total) by mouth daily., Disp: 90 tablet, Rfl: 3 .  Multiple Vitamin (MULTIVITAMIN) tablet, Take 1 tablet by mouth daily., Disp: , Rfl:  .  naratriptan (AMERGE) 2.5 MG tablet, Take 1 tablet (2.5 mg total) by mouth as needed for migraine. Take one (1) tablet at onset of headache; if returns or does not resolve, may repeat after 4 hours; do not exceed five (5) mg in 24 hours., Disp: 10 tablet, Rfl: 11 .  Omega-3 Fatty Acids (FISH OIL) 1000 MG CAPS, Take 2 capsules by mouth daily., Disp: , Rfl:  .  omeprazole (PRILOSEC) 40 MG capsule, Take 40 mg by mouth daily., Disp: , Rfl: 1 .  ondansetron (ZOFRAN-ODT) 4 MG disintegrating tablet, DISSOLVE 1 TABLET ON THE TONGUE EVERY 8 HOURS AS NEEDED FOR NAUSEA OR VOMITING, Disp: 20 tablet, Rfl: 11 .  oxyCODONE-acetaminophen (PERCOCET) 7.5-325 MG tablet, Take 1 tablet by mouth every 8 (eight) hours., Disp: , Rfl:  .  potassium chloride SA (KLOR-CON) 20 MEQ tablet, Take 1 tablet (20 mEq total) by mouth 2 (two) times daily., Disp: 60 tablet, Rfl: 3 .  predniSONE (DELTASONE) 5 MG tablet, Take 5 mg by mouth every 3 (three) days. Take one tablet every 3 days, Disp: , Rfl:  .  pregabalin (LYRICA) 200 MG capsule, Take 200 mg by mouth daily., Disp: , Rfl:  .  Rimegepant Sulfate (NURTEC) 75 MG TBDP, Take 75 mg by mouth daily as needed (take for abortive therapy of migraine, no more than 1 tablet in 24 hours or 10 per month)., Disp: 10 tablet, Rfl: 11 .  sertraline (ZOLOFT) 100 MG tablet, sertraline 100 mg tablet  TAKE 1 TABLET BY MOUTH EVERY DAY, Disp: , Rfl:  .  sertraline (ZOLOFT) 25 MG tablet, Take by  mouth with 100 mg tablet for total of 125 mg at bedtime., Disp: , Rfl:  .  Suvorexant (BELSOMRA) 20 MG TABS, Take 20 mg by mouth Nightly. Take one tablet at bedtime daily, Disp: , Rfl:  .  tolterodine (DETROL LA) 4 MG 24 hr capsule, Take 4 mg by mouth daily., Disp: , Rfl:  .  XIIDRA 5 % SOLN, Place 1 drop into both eyes 2 (two) times daily., Disp: , Rfl:    Allergies  Allergen Reactions  . Ubrogepant Swelling    Lip swelling   . Benazepril     Lip swelling  . Cymbalta [Duloxetine Hcl]     Lip swelling  . Hygroton [Chlorthalidone] Swelling    Swelling in lips  . Mirabegron Itching    Other reaction(s): rash    Past Medical History:  Diagnosis Date  . Arthritis   . Atrophic vaginitis   . Atypical chest pain    thought to be GERD; myoview 05/17/07-no significant ischemia; echo 07/20/11- normal EF=>55%  . Baker's cyst 03/28/12   venous doppler 03/28/12=no thrombus; baker's cyst at left popliteal fossa  . Chest pain   . CIN I (cervical intraepithelial neoplasia I) 1990  . Degenerative disc disease, cervical 2020   severe, C3-4, 4-5, 5-6. Currently receiving injections by Dr. Vira Blanco of preferred pain management  . Fibroid   . Fibromyalgia   . Hypertension   . Lupus nephritis ([place])   . Migraines   . Neuropathy   . Osteopenia 09/2017   T score -2.0 improved from prior study  . Premature menopause age 66   following chemotherapy for Lupus  . Shingles      Past Surgical History:  Procedure Laterality Date  . cervical fossa injections  2020  . CESAREAN SECTION    . COLPOSCOPY    . HYSTEROSCOPY     Myomectomy  . MYOMECTOMY     Hysteroscopic myomectomy  . NASAL SINUS SURGERY  01/2016  . thumb surg  07/07/2019  . TUBAL LIGATION      Family History  Problem Relation Age of Onset  . Hypertension Mother   . Asthma Mother   . COPD Mother   . Diabetes Father   . Hypertension Father   . Heart disease Father   . Cancer Father        prostate    Social History   Tobacco  Use  . Smoking status: Never Smoker  . Smokeless tobacco: Never Used  Vaping Use  . Vaping Use: Never used  Substance Use Topics  . Alcohol use: No    Alcohol/week: 0.0 standard drinks  . Drug use: No    ROS   Objective:  Vitals: BP 120/80   Pulse (!) 52   Temp 98.3 F (36.8 C) (Oral)   Resp 17   SpO2 98%   Physical Exam Constitutional:      General: She is not in acute distress.    Appearance: Normal appearance. She is well-developed. She is not ill-appearing, toxic-appearing or diaphoretic.  HENT:     Head: Normocephalic and atraumatic.     Nose: Nose normal.     Mouth/Throat:     Mouth: Mucous membranes are moist.     Pharynx: Oropharynx is clear.  Eyes:     General: No scleral icterus.       Right eye: No discharge.        Left eye: No discharge.     Extraocular Movements: Extraocular movements intact.     Conjunctiva/sclera: Conjunctivae normal.     Pupils: Pupils are equal, round, and reactive to light.  Cardiovascular:     Rate and Rhythm: Normal rate.  Pulmonary:     Effort: Pulmonary effort is normal.  Musculoskeletal:     Right wrist: Swelling, tenderness (over area outlined), bony tenderness and snuff box tenderness present. No deformity, effusion, lacerations or crepitus. Decreased range of motion.       Arms:  Skin:    General: Skin is warm and dry.  Neurological:     General: No focal deficit present.     Mental Status: She is alert and oriented to person, place, and time.     Motor: No weakness.     Coordination: Coordination normal.     Gait: Gait normal.     Deep Tendon Reflexes: Reflexes normal.  Psychiatric:        Mood and Affect: Mood normal.        Behavior: Behavior normal.        Thought Content: Thought content normal.        Judgment: Judgment normal.     DG Wrist Complete Right  Result Date: 06/08/2020 CLINICAL DATA:  Fall with wrist pain. EXAM: RIGHT WRIST - COMPLETE 3+ VIEW COMPARISON:  None. FINDINGS: Three-view exam  of the right ribs shows comminuted fracture of the distal radial metaphysis with intra-articular extension. No evidence for distal ulnar fracture. Carpal alignment is intact. IMPRESSION: Comminuted distal radius fracture with intra-articular extension. Electronically Signed   By: Misty Stanley M.D.   On: 06/08/2020 19:24     Assessment and Plan :   I have reviewed the PDMP during this encounter.  1. Closed fracture of distal end of right radius, unspecified fracture morphology, initial encounter   2. Acute pain of right wrist   3. Accidental fall, initial encounter     Will have patient placed in a sugar tong splint. Follow up with Raliegh Ip asap. Provided with short supply of hydrocodone for her current fracture.  Follow up with chronic pain provider for further refills on hydrocodone for her pain. Counseled patient on potential for adverse effects with medications prescribed/recommended today, ER and return-to-clinic precautions discussed, patient verbalized understanding.    Jaynee Eagles, [place] 06/08/20 1943

## 2020-06-08 NOTE — Progress Notes (Signed)
Orthopedic Tech Progress Note Patient Details:  Marie Peterson January 06, 1966 [id number] Just called for SPLINT Patient ID: [name], female   DOB: 08/04/1965, 55 y.o.   MRN: [id number]   [name] 06/08/2020, 8:25 PM

## 2020-06-08 NOTE — ED Triage Notes (Signed)
Pt c/o right wrist injury. Pt states her wrist is swollen and is painful. She states she fell backwards and braced herself with her hand.

## 2020-06-08 NOTE — Discharge Instructions (Addendum)
Please just use Tylenol at a dose of 500mg -650mg  once every 6 hours as needed for your aches and pains. You can use hydrocodone, a narcotic pain medicine, once every 4-6 hours if Tylenol does not help your pain.  Once your pain is better controlled, switch back to just Tylenol. Please contact [facility] for a consultation regarding your wrist fracture.

## 2020-06-08 NOTE — Progress Notes (Signed)
Orthopedic Tech Progress Note Patient Details:  Marie Peterson [birth year] [id number]  Ortho Devices Type of Ortho Device: Arm sling,Sugartong splint Ortho Device/Splint Location: RUE Ortho Device/Splint Interventions: Ordered,Application,Adjustment   Post Interventions Patient Tolerated: Well Instructions Provided: Care of device   [name] 06/08/2020, 8:45 PM

## 2020-06-09 ENCOUNTER — Ambulatory Visit (HOSPITAL_COMMUNITY): Payer: 59 | Attending: Cardiology

## 2020-06-09 DIAGNOSIS — E785 Hyperlipidemia, unspecified: Secondary | ICD-10-CM

## 2020-06-09 DIAGNOSIS — R001 Bradycardia, unspecified: Secondary | ICD-10-CM | POA: Diagnosis not present

## 2020-06-09 DIAGNOSIS — I10 Essential (primary) hypertension: Secondary | ICD-10-CM | POA: Diagnosis not present

## 2020-06-09 LAB — ECHOCARDIOGRAM COMPLETE
Area-P 1/2: 4.6 cm2
S' Lateral: 3.1 cm

## 2020-06-14 ENCOUNTER — Other Ambulatory Visit: Payer: Self-pay | Admitting: Orthopedic Surgery

## 2020-06-14 ENCOUNTER — Other Ambulatory Visit: Payer: Self-pay

## 2020-06-14 ENCOUNTER — Ambulatory Visit
Admission: RE | Admit: 2020-06-14 | Discharge: 2020-06-14 | Disposition: A | Discharge status: Home / Self Care | Payer: 59 | Source: Ambulatory Visit | Attending: Orthopedic Surgery | Admitting: Orthopedic Surgery

## 2020-06-14 DIAGNOSIS — S52501A Unspecified fracture of the lower end of right radius, initial encounter for closed fracture: Secondary | ICD-10-CM | POA: Diagnosis not present

## 2020-06-14 DIAGNOSIS — S52571A Other intraarticular fracture of lower end of right radius, initial encounter for closed fracture: Secondary | ICD-10-CM | POA: Diagnosis not present

## 2020-06-14 DIAGNOSIS — M25531 Pain in right wrist: Secondary | ICD-10-CM

## 2020-06-15 DIAGNOSIS — S52501D Unspecified fracture of the lower end of right radius, subsequent encounter for closed fracture with routine healing: Secondary | ICD-10-CM | POA: Diagnosis not present

## 2020-06-17 DIAGNOSIS — S52571A Other intraarticular fracture of lower end of right radius, initial encounter for closed fracture: Secondary | ICD-10-CM | POA: Diagnosis not present

## 2020-06-21 DIAGNOSIS — Y999 Unspecified external cause status: Secondary | ICD-10-CM | POA: Diagnosis not present

## 2020-06-21 DIAGNOSIS — G5601 Carpal tunnel syndrome, right upper limb: Secondary | ICD-10-CM | POA: Diagnosis not present

## 2020-06-21 DIAGNOSIS — G8918 Other acute postprocedural pain: Secondary | ICD-10-CM | POA: Diagnosis not present

## 2020-06-21 DIAGNOSIS — X58XXXA Exposure to other specified factors, initial encounter: Secondary | ICD-10-CM | POA: Diagnosis not present

## 2020-06-21 DIAGNOSIS — S52571A Other intraarticular fracture of lower end of right radius, initial encounter for closed fracture: Secondary | ICD-10-CM | POA: Diagnosis not present

## 2020-06-21 HISTORY — PX: WRIST SURGERY: SHX841

## 2020-06-22 ENCOUNTER — Other Ambulatory Visit: Payer: Self-pay | Admitting: General Practice

## 2020-06-22 NOTE — Progress Notes (Signed)
Cardiology Office Note:    Date:  07/05/2020   ID:  [name], DOB 07/25/1965, MRN [id number]  PCP:  [name], MD  Cardiologist:  Quay Burow, MD  Electrophysiologist:  None   Referring MD: [name], MD   Chief Complaint: follow-up of dyspnea and lower extremity edema  History of Present Illness:    Marie Peterson is a 55 y.o. female with a with a history of atypical chest pain with normal stress test in 10/2014, sinus bradycardia noted on monitor in 2018,  hypertension, hyperlipidemia, GERD, SLE with lupus nephritis, fibromyalgia, migraines, arthritis, and chronic pain who is followed by Dr. Gwenlyn Found and presents today for follow-up of shortness of breath and lower extremity edema.  Patient has history of atypical chest pain. She underwent ETT in 10/2014 which was normal. She was noted to be bradycardic in fall of 2018. Monitor in 12/2016 showed sinus bradycardia with rates in the 40's to 60's. Echo showed LVEF of 60-65% with normal wall motion, normal diastolic function, trace MR, and trace TR. She had some fatigue with this but was otherwise asymptomatic. Patient was seen by Dr. Gwenlyn Found in 07/2019 at which time she reported being under a lot of stress at home but denied any chest pain or shortness of breath. Her BP was mildly elevated and her Amlodipine was increased. Since then, she has been seen multiple times in the [facility] clinic.  I saw the patient on 05/18/2020 for further evaluation of dyspnea on exertion, lower extremity edema, and weight gain. She also reported chest heaviness on exertion preceded by shortness of breath. BNP was normal. Lasix was increased to $RemoveBefo'80mg'sEXCkXOpKQo$  twice daily. Echo was ordered and showed LVEF of 60-65% with normal wall motion and mild LVH. Chest discomfort was not felt to be due to ACS. Plan was to follow-up in a month and if still present could do coronary CTA.  Patient presents today for follow-up. Patient reports doing much better from a heart  standpoint since last visit. Her shortness of breath and lower extremity edema have improved with higher dose of Lasix. Chest heaviness has resolved. No orthopnea or PND. No palpitations, lightheadedness, dizziness, or near syncope/syncope. She did have a mechanical fall since last visit and injured her right wrist. She had surgery for this and is still recovering from this.  Past Medical History:  Diagnosis Date  . Arthritis   . Atrophic vaginitis   . Atypical chest pain    thought to be GERD; myoview 05/17/07-no significant ischemia; echo 07/20/11- normal EF=>55%  . Baker's cyst 03/28/12   venous doppler 03/28/12=no thrombus; baker's cyst at left popliteal fossa  . Chest pain   . CIN I (cervical intraepithelial neoplasia I) 1990  . Degenerative disc disease, cervical 2020   severe, C3-4, 4-5, 5-6. Currently receiving injections by Dr. Vira Blanco of preferred pain management  . Fibroid   . Fibromyalgia   . Hypertension   . Lupus nephritis ([place])   . Migraines   . Neuropathy   . Osteopenia 09/2017   T score -2.0 improved from prior study  . Premature menopause age 7   following chemotherapy for Lupus  . Shingles     Past Surgical History:  Procedure Laterality Date  . cervical fossa injections  2020  . CESAREAN SECTION    . COLPOSCOPY    . HYSTEROSCOPY     Myomectomy  . MYOMECTOMY     Hysteroscopic myomectomy  . NASAL SINUS SURGERY  01/2016  . thumb  surg  07/07/2019  . TUBAL LIGATION      Current Medications: Current Meds  Medication Sig  . AJOVY 225 MG/1.5ML SOSY INJECT $RemoveBef'675MG'JYoIatoSLo$  UNDER THE SKIN EVERY 3 MONTHS  . alendronate (FOSAMAX) 70 MG tablet TAKE 1 TABLET(70 MG) BY MOUTH EVERY 7 DAYS WITH A FULL GLASS OF WATER AND ON AN EMPTY STOMACH  . aspirin 81 MG tablet Take 81 mg by mouth daily.  . benzonatate (TESSALON) 100 MG capsule Take 100 mg by mouth 3 (three) times daily.  . Cholecalciferol (VITAMIN D PO) Take 2,000 Int'l Units by mouth daily.   . clotrimazole-betamethasone  (LOTRISONE) cream Apply 1 application topically 2 (two) times daily.  . diclofenac sodium (VOLTAREN) 1 % GEL Apply topically.  [name] Kitchen FERREX 150 150 MG capsule Take 1 tablet by mouth daily.  . folic acid (FOLVITE) 595 MCG tablet Take 400 mcg by mouth daily.  . furosemide (LASIX) 80 MG tablet TAKE 1 TABLET(80 MG) BY MOUTH TWICE DAILY  . hydrALAZINE (APRESOLINE) 10 MG tablet TAKE 2 TABLETS(20 MG) BY MOUTH TWICE DAILY AS NEEDED FOR SYSTOLIC BLOOD PRESSURE OVER 150  . HYDROcodone-acetaminophen (NORCO/VICODIN) 5-325 MG tablet Take 1 tablet by mouth every 6 (six) hours as needed for severe pain.  . hydroxychloroquine (PLAQUENIL) 200 MG tablet Take 200 mg by mouth 2 (two) times daily.   [name] Kitchen L-Methylfolate-B6-B12 (METANX PO) Take 1 tablet by mouth 2 (two) times daily.   [name] Kitchen lidocaine (LIDODERM) 5 % Place 3 patches onto the skin daily. Remove & Discard patch within 12 hours or as directed by MD  . LORazepam (ATIVAN) 0.5 MG tablet Take 0.5 mg by mouth daily as needed.  . magnesium oxide (MAGNESIUM-OXIDE) 400 (241.3 Mg) MG tablet Take 1 tablet (400 mg total) by mouth daily.  . Multiple Vitamin (MULTIVITAMIN) tablet Take 1 tablet by mouth daily.  . naratriptan (AMERGE) 2.5 MG tablet Take 1 tablet (2.5 mg total) by mouth as needed for migraine. Take one (1) tablet at onset of headache; if returns or does not resolve, may repeat after 4 hours; do not exceed five (5) mg in 24 hours.  . Omega-3 Fatty Acids (FISH OIL) 1000 MG CAPS Take 2 capsules by mouth daily.  [name] Kitchen omeprazole (PRILOSEC) 40 MG capsule Take 40 mg by mouth daily.  . ondansetron (ZOFRAN-ODT) 4 MG disintegrating tablet DISSOLVE 1 TABLET ON THE TONGUE EVERY 8 HOURS AS NEEDED FOR NAUSEA OR VOMITING  . oxyCODONE-acetaminophen (PERCOCET) 7.5-325 MG tablet Take 1 tablet by mouth every 8 (eight) hours.  . potassium chloride SA (KLOR-CON) 20 MEQ tablet Take 1 tablet (20 mEq total) by mouth 2 (two) times daily.  . predniSONE (DELTASONE) 5 MG tablet Take 5 mg by mouth  every 3 (three) days. Take one tablet every 3 days  . pregabalin (LYRICA) 200 MG capsule Take 200 mg by mouth daily.  . Rimegepant Sulfate (NURTEC) 75 MG TBDP Take 75 mg by mouth daily as needed (take for abortive therapy of migraine, no more than 1 tablet in 24 hours or 10 per month).  . sertraline (ZOLOFT) 100 MG tablet sertraline 100 mg tablet  TAKE 1 TABLET BY MOUTH EVERY DAY  . sertraline (ZOLOFT) 25 MG tablet Take by mouth with 100 mg tablet for total of 125 mg at bedtime.  . Suvorexant (BELSOMRA) 20 MG TABS Take 20 mg by mouth Nightly. Take one tablet at bedtime daily  . tolterodine (DETROL LA) 4 MG 24 hr capsule Take 4 mg by mouth daily.  [name] Kitchen XIIDRA 5 %  SOLN Place 1 drop into both eyes 2 (two) times daily.     Allergies:   Ubrogepant, Benazepril, Cymbalta [duloxetine hcl], Hygroton [chlorthalidone], Mirabegron, Other, and Duloxetine   Social History   Socioeconomic History  . Marital status: Married    Spouse name: [name]  . Number of children: 2  . Years of education: College  . Highest education level: Not on file  Occupational History    Employer: DISABLED    Comment: disability  Tobacco Use  . Smoking status: Never Smoker  . Smokeless tobacco: Never Used  Vaping Use  . Vaping Use: Never used  Substance and Sexual Activity  . Alcohol use: No    Alcohol/week: 0.0 standard drinks  . Drug use: No  . Sexual activity: Not Currently    Birth control/protection: Surgical, Post-menopausal    Comment: Tubal lig-1st intercourse 55 yo-Fewer than 5 partners  Other Topics Concern  . Not on file  Social History Narrative   Patient lives at home with her daughter. She is currently separated (not legally).   Caffeine Use: 1 cup of coffee daily.    Right-handed   Social Determinants of Radio broadcast assistant Strain: Not on file  Food Insecurity: Not on file  Transportation Needs: Not on file  Physical Activity: Not on file  Stress: Not on file  Social Connections: Not  on file     Family History: The patient's family history includes Asthma in her mother; COPD in her mother; Cancer in her father; Diabetes in her father; Heart disease in her father; Hypertension in her father and mother.  ROS:   Please see the history of present illness.     EKGs/Labs/Other Studies Reviewed:    The following studies were reviewed today:  Exercise Tolerance Test 11/12/2014:  Upsloping ST segment depression ST segment depression of 0.5 mm was noted during stress, beginning at 12 minutes of stress, and returning to baseline after less than 1 minute of recovery.  Negative, adequate GXT with the patient exercising to a HR of 153 (90%APMHR) and demonstrating excellent exercise tolerance (15.3 met workload). There were occasional PAC's. Nondiagnostic upsloping ST depression was present in late stage 4 and 5 with rapid resolution in the immediate recovery period.  _______________  Echocardiogram 06/09/2020: Impressions: 1. Left ventricular ejection fraction, by estimation, is 60 to 65%. The  left ventricle has normal function. The left ventricle has no regional  wall motion abnormalities. There is mild left ventricular hypertrophy.  Left ventricular diastolic parameters  were normal.  2. Right ventricular systolic function is normal. The right ventricular  size is normal. Tricuspid regurgitation signal is inadequate for assessing  PA pressure.  3. The mitral valve is normal in structure. Trivial mitral valve  regurgitation.  4. The aortic valve is tricuspid. Aortic valve regurgitation is not  visualized. No aortic stenosis is present.  5. The inferior vena cava is normal in size with greater than 50%  respiratory variability, suggesting right atrial pressure of 3 mmHg.   EKG:  EKG not ordered today.   Recent Labs: 08/08/2019: ALT 20 05/18/2020: BNP 70.2 06/03/2020: BUN 11; Creatinine, Ser 0.81; Potassium 4.0; Sodium 142  Recent Lipid Panel    Component Value  Date/Time   CHOL 191 08/08/2019 0853   TRIG 54 08/08/2019 0853   HDL 70 08/08/2019 0853   CHOLHDL 2.7 08/08/2019 0853   LDLCALC 111 (H) 08/08/2019 0853    Physical Exam:    Vital Signs: BP 132/80   Pulse  60   Ht 5\' 4"  (1.626 m)   Wt 191 lb (86.6 kg)   SpO2 97%   BMI 32.79 kg/m     Wt Readings from Last 3 Encounters:  07/05/20 191 lb (86.6 kg)  05/18/20 199 lb (90.3 kg)  04/20/20 189 lb 3.2 oz (85.8 kg)     General: 55 y.o. African-American female in no acute distress. HEENT: Normocephalic and atraumatic. Sclera clear.  Neck: Supple. No carotid bruits. No JVD. Heart: RRR. Distinct S1 and S2. No murmurs, gallops, or rubs. Radial pulses 2+ and equal bilaterally. Lungs: No increased work of breathing. Clear to ausculation bilaterally. No wheezes, rhonchi, or rales.  Abdomen: Soft, non-distended, and non-tender to palpation. Bowel sounds present. Extremities: Mild ankle edema bilaterally. Skin: Warm and dry. Neuro: Alert and oriented x3. No focal deficits. Psych: Normal affect. Responds appropriately.  Assessment:    1. Dyspnea on exertion   2. Lower extremity edema   3. History of chest heaviness   4. Sinus bradycardia   5. Hyperlipidemia, unspecified hyperlipidemia type   6. Primary hypertension   7. Medication management     Plan:    Dyspnea on Exertion Lower Extremity Edema - Dyspnea and lower extremity edema improved with increased dose in Lasix. - BNP at last visit normal. - Recent Echo showed LVEF of 60-65% with normal wall motion and mild LVH and normal diastolic function. - Appears euvolemic on exam. - Continue Lasix 80mg  twice daily. Continue K-Dur 20 mEq twice daily (OK to take 40 mEq once daily rather than dividing into twice a day dosing if patient prefers). - Continue daily weights and sodium/fluid restrictions.   Chest Heaviness - At last visit, patient reported chest heaviness with exertion only after she gets short of breath. No other chest pain.  EKG showsed non-specific ST/T changes. Felt to possibly be secondary to CHF. - Chest heaviness resolved with increased dose in Lasix. - No additional work-up needed at this time.  Sinus Bradycardia - Chronic and stable. - Not on any AV nodal agents.   Hypertension - BP well controlled.  - Continue current medications: Amlodipine 5mg  daily and Hydralazine 20mg  twice daily as needed for systolic BP >270.  Hyperlipidemia - Most recent lipid panel in 10/2019 (per [facility]): Total Cholesterol 192, Triglycerides 46, HDL 76, LDL 107.  - Lifestyle modifications with exercise and heart healthy diet were recommended at time of last check.  - Patient not fasting today but will return for fasting lipid panel/LFTs.  Disposition: Follow up in 6 months.  Medication Adjustments/Labs and Tests Ordered: Current medicines are reviewed at length with the patient today.  Concerns regarding medicines are outlined above.  Orders Placed This Encounter  Procedures  . Lipid panel  . Hepatic function panel   No orders of the defined types were placed in this encounter.   Patient Instructions  Medication Instructions:  Continue current medications  *If you need a refill on your cardiac medications before your next appointment, please call your pharmacy*   Lab Work: Fasting Lipid and Liver  If you have labs (blood work) drawn today and your tests are completely normal, you will receive your results only by: [name] Kitchen MyChart Message (if you have MyChart) OR . A paper copy in the mail If you have any lab test that is abnormal or we need to change your treatment, we will call you to review the results.   Testing/Procedures: None Ordered   Follow-Up: At [facility], you and your health needs  are our priority.  As part of our continuing mission to provide you with exceptional heart care, we have created designated Provider Care Teams.  These Care Teams include your primary Cardiologist (physician) and  Advanced Practice Providers (APPs -  Physician Assistants and Nurse Practitioners) who all work together to provide you with the care you need, when you need it.  We recommend signing up for the patient portal called "MyChart".  Sign up information is provided on this After Visit Summary.  MyChart is used to connect with patients for Virtual Visits (Telemedicine).  Patients are able to view lab/test results, encounter notes, upcoming appointments, etc.  Non-urgent messages can be sent to your provider as well.   To learn more about what you can do with MyChart, go to [email or web address].    Your next appointment:   6 month(s)  The format for your next appointment:   In Person  Provider:   You may see Quay Burow, MD or one of the following Advanced Practice Providers on your designated Care Team:    Sande Rives, PA-C  Coletta Memos, FNP         Signed, Darreld Mclucas, [place]  07/05/2020 4:04 PM    [place]

## 2020-06-24 DIAGNOSIS — S52571A Other intraarticular fracture of lower end of right radius, initial encounter for closed fracture: Secondary | ICD-10-CM | POA: Diagnosis not present

## 2020-06-24 DIAGNOSIS — M25631 Stiffness of right wrist, not elsewhere classified: Secondary | ICD-10-CM | POA: Diagnosis not present

## 2020-06-24 DIAGNOSIS — M25641 Stiffness of right hand, not elsewhere classified: Secondary | ICD-10-CM | POA: Diagnosis not present

## 2020-06-24 DIAGNOSIS — Z4789 Encounter for other orthopedic aftercare: Secondary | ICD-10-CM | POA: Diagnosis not present

## 2020-07-05 ENCOUNTER — Other Ambulatory Visit: Payer: Self-pay

## 2020-07-05 ENCOUNTER — Ambulatory Visit (INDEPENDENT_AMBULATORY_CARE_PROVIDER_SITE_OTHER): Payer: 59 | Admitting: Student

## 2020-07-05 ENCOUNTER — Other Ambulatory Visit: Payer: Self-pay | Admitting: Cardiovascular Disease

## 2020-07-05 ENCOUNTER — Encounter: Payer: Self-pay | Admitting: Student

## 2020-07-05 VITALS — BP 132/80 | HR 60 | Ht 64.0 in | Wt 191.0 lb

## 2020-07-05 DIAGNOSIS — R001 Bradycardia, unspecified: Secondary | ICD-10-CM

## 2020-07-05 DIAGNOSIS — R0609 Other forms of dyspnea: Secondary | ICD-10-CM

## 2020-07-05 DIAGNOSIS — I10 Essential (primary) hypertension: Secondary | ICD-10-CM

## 2020-07-05 DIAGNOSIS — R0789 Other chest pain: Secondary | ICD-10-CM

## 2020-07-05 DIAGNOSIS — Z79899 Other long term (current) drug therapy: Secondary | ICD-10-CM

## 2020-07-05 DIAGNOSIS — E785 Hyperlipidemia, unspecified: Secondary | ICD-10-CM | POA: Diagnosis not present

## 2020-07-05 DIAGNOSIS — R600 Localized edema: Secondary | ICD-10-CM

## 2020-07-05 DIAGNOSIS — R0600 Dyspnea, unspecified: Secondary | ICD-10-CM | POA: Diagnosis not present

## 2020-07-05 NOTE — Patient Instructions (Signed)
Medication Instructions:  Continue current medications  *If you need a refill on your cardiac medications before your next appointment, please call your pharmacy*   Lab Work: Fasting Lipid and Liver  If you have labs (blood work) drawn today and your tests are completely normal, you will receive your results only by: [name] Kitchen MyChart Message (if you have MyChart) OR . A paper copy in the mail If you have any lab test that is abnormal or we need to change your treatment, we will call you to review the results.   Testing/Procedures: None Ordered   Follow-Up: At [facility], you and your health needs are our priority.  As part of our continuing mission to provide you with exceptional heart care, we have created designated Provider Care Teams.  These Care Teams include your primary Cardiologist (physician) and Advanced Practice Providers (APPs -  Physician Assistants and Nurse Practitioners) who all work together to provide you with the care you need, when you need it.  We recommend signing up for the patient portal called "MyChart".  Sign up information is provided on this After Visit Summary.  MyChart is used to connect with patients for Virtual Visits (Telemedicine).  Patients are able to view lab/test results, encounter notes, upcoming appointments, etc.  Non-urgent messages can be sent to your provider as well.   To learn more about what you can do with MyChart, go to [email or web address].    Your next appointment:   6 month(s)  The format for your next appointment:   In Person  Provider:   You may see Quay Burow, MD or one of the following Advanced Practice Providers on your designated Care Team:    [place], PA-C  Coletta Memos, FNP

## 2020-07-09 LAB — LIPID PANEL
Chol/HDL Ratio: 2.8 ratio (ref 0.0–4.4)
Cholesterol, Total: 165 mg/dL (ref 100–199)
HDL: 59 mg/dL (ref >39)
LDL Chol Calc (NIH): 90 mg/dL (ref 0–99)
Triglycerides: 87 mg/dL (ref 0–149)
VLDL Cholesterol Cal: 16 mg/dL (ref 5–40)

## 2020-07-09 LAB — HEPATIC FUNCTION PANEL
ALT: 16 IU/L (ref 0–32)
AST: 22 IU/L (ref 0–40)
Albumin: 3.9 g/dL (ref 3.8–4.9)
Alkaline Phosphatase: 115 IU/L (ref 44–121)
Bilirubin Total: <0.2 mg/dL (ref 0.0–1.2)
Bilirubin, Direct: <0.1 mg/dL (ref 0.00–0.40)
Total Protein: 6.8 g/dL (ref 6.0–8.5)

## 2020-07-15 ENCOUNTER — Telehealth: Payer: Self-pay | Admitting: *Deleted

## 2020-07-15 DIAGNOSIS — S52571A Other intraarticular fracture of lower end of right radius, initial encounter for closed fracture: Secondary | ICD-10-CM | POA: Diagnosis not present

## 2020-07-15 NOTE — Telephone Encounter (Signed)
Submitted PA Ajovy on CMM. Key: [id number]. PA Case ID: [id number] - Rx #: [id number]. Waiting on determination from [facility]

## 2020-07-15 NOTE — Telephone Encounter (Signed)
PA denied, now excluded from patient insurance plan. I called pharmacy and provided copay card information: BIN: [id number], PCD: [id number], Grp: [id number], ID: [id number]. They were able to get paid claim, copay: 50.00.

## 2020-07-15 NOTE — Progress Notes (Addendum)
Chief Complaint  Patient presents with  . Follow-up    RM 1 alone Pt is well, has about 5-6 migraines a month, aren't as frequent as before.      HISTORY OF PRESENT ILLNESS: 07/19/20 ALL:  She returns for follow up for migraines. She continues Ajovy injections. She feels that these have helped significantly. She does feel that Ajovy wears off towards the end of the cycle (getting 3inj/55mths). She has about 5-6 migraines a month. Amerge works well for abortive therapy. Gabapentin was switched Lyrica. She is now taking 200mg  BID. Insurance no longer covers Ajovy. She is requesting to switch to oral CGRP due to fears of injections.    01/15/2020 ALL:  Marie Peterson is a 55 y.o. female here today for follow up for migraines. Sleep eval was normal. She continues  Ajovy and gabapentin 600/600/1200. She was started on Effexor by psychiatry but had an allergic reaction. She is now taking sertraline. She continues Nurtec for abortive therapy and feels that it helps with pain but does not abort migraine. She continues to have daily tension headaches. She has about 8 migraines per month. She admits stress is contributor. She is followed closely by Marie Peterson. She has chronic pain on oxycodone TID.    HISTORY (copied from Marie Peterson note on 09/11/2019)  Marie Peterson a 55  year-old [place] or [place] American female patientestablished with Marie. Jaynee Peterson and seen here upon a referralon 09/11/2019 from Marie Peterson for sleep disordered breathing. Chiefconcernaccording to patient :  Marie Peterson reports that her daughter has slept for a while at her house and noticed her to twitch and move during her sleep may be more of a trembling.  She also noted irregular breathing and pauses in her sleep breathing.  For this reason she is referred for evaluation of possible sleep apnea.  Marie. Jani Peterson referred.  I reviewed the patient's medication she is on Percocet, she is still on oxycodone.   Prednisone,  amlodipine Benzapril, alendronate, omeprazole, Tessalon Perles as needed furosemide 40 mg actually 1.5 tablets in the morning and 1 in the night, hydralazine twice a day, no tach, Belsomra 20 mg Trintellix 10 mg not also on gabapentin 3 times daily. A lot of medications with sleep inducing or sleep changing potential.   Marie Peterson isa right -handed Black or [place] American female with a possible sleep disorder. She has a past medical history of Lupus, Arthritis, Atrophic vaginitis, Atypical chest pain, Baker's cyst (03/28/12), Chest pain, CIN I (cervical intraepithelial neoplasia I) (1990), Degenerative disc disease, cervical (2020), Fibroid, "Fibromyalgia", Hypertension, Lupus nephritis ([place]), Migraines, Neuropathy, Osteopenia (09/2017), Premature menopause (age 29), and Shingles.  Sleeprelevant medical history: Nocturia- diuretic induced 3-4 times , No Sleep walking, sinus surgery  Familymedical /sleep history:niece is another family member on CPAP with OSA.  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 Family status is separated. Tobacco use- none . ETOH use ;none , Caffeine intake in form of Coffee( 1 cup daily  Soda( none) Tea ( rare ) nor energy drinks. Regular exercise - none .   Sleep habits are as follows:The patient's dinner time is between 6 PM. The patient goes to bed at variable times , 10-11 PM and struggles to sleep-once asleep continues to sleep for 1-3 hours, wakes for many bathroom breaks.   The preferred sleep position is supine of on her side, with the support of  2 pillows. She sleeps with a boot, plantar fasciitis.   Dreams are reportedly frequent.   8.30 AM is the usual rise time. The patient wakes up spontaneously between 4.30 and 5 AM , but tries to get more sleep.   She reports not feeling refreshed or restored in AM, with symptoms such as dry mouth , morning headaches , and residual fatigue.  Naps are taken frequently, lasting from 1-2 hours , feels these are less refreshing than nocturnal sleep. She reportedly yawns al through the day.   "Addendum Marie Peterson/ Marie Peterson have followed for Migraine and here is the last note from 03-11-2019: Marie Peterson a 55 y.o.femalehere today for follow up. She is under more stress recently with her dad's new diagnosis of prostate and brain cancer. She is one of 12 children and feels that they all look to her for support. She has noticed headaches are worse. Same headache but more frequent. She continues to do well with Ajovy every three months, gabapentin 600mg  three times daily and Nurtec for abortive therapy. BP has been elevated. She is currently separated from her husband. She is followed by Marie Peterson. Effexor was recently switched to Trintellix. She feels that Trintellix is making her sick on her stomach. She is followed closely by PCP as well. She is taking hydralazine, amlodipine and benazepril for HTN. She is on chronic opioids for pain management. "  History (copied from my note on 03/11/2019)  Marie Peterson is a 55 y.o. female here today for follow up. She is under more stress recently with her dad's new diagnosis of prostate and brain cancer. She is one of 12 children and feels that they all look to her for support. She has noticed headaches are worse. Same headache but more frequent. She continues to do well with Ajovy every three months, gabapentin 600mg  three times daily and Nurtec for abortive therapy. BP has been elevated. She is currently separated from her husband. She is followed by Marie Peterson. Effexor was recently switched to Trintellix. She feels that Trintellix is making her sick on her stomach. She is followed closely by  PCP as well. She is taking hydralazine, amlodipine and benazepril for HTN. She is on chronic opioids for pain management.   HISTORY: (copied from my note on 10/31/2018)  Marie Peterson a 55 y.o.femalehere  today for follow up. She has had an increase in migraines. She is having daily headaches. She reports at least 2-3 migrainous headaches each week with light/sound sensitivity and nausea. Heat and stress make headaches worse. She is going through a separation currently and has noticed migraines are worse over the past month. She is using Ajovy injections (3injections every three months). She is taking gabapentin 600mg  TID for neuropathy. She is taking Percocet 7.5/325mg  for severe DDD prescribed by PCP. She is also seeing rheumatology for Lupus. She uses Zomig for abortive therapy that does ease up the headache. She occasionally takes Zofran for nausea with migraine.   She is taking Belsomra for sleep. She does not feel it is helping her. She continues to wake frequently throughout the night. She does not snore. She is admittedly more depressed. She has not spoken with PCP due to him being out of the office for medical leave. She is not taking any antidepressants at this time. She does not remember being on anything in the past with the exception of Cymbalta for pain. This medications did cause mild lip swelling. No respiratory involvement , no  swollen tongue or airway. No trouble breathing.   REVIEW OF SYSTEMS: Out of a complete 14 system review of symptoms, the patient complains only of the following symptoms, headaches, chronic pain, anxiety and depression and all other reviewed systems are negative.   ALLERGIES: Allergies  Allergen Reactions  . Ubrogepant Swelling    Lip swelling   . Benazepril     Lip swelling  . Cymbalta [Duloxetine Hcl]     Lip swelling  . Hygroton [Chlorthalidone] Swelling    Swelling in lips  . Mirabegron Itching    Other reaction(s): rash  . Other     Other reaction(s): Unknown  . Duloxetine Rash    Lip swelling     HOME MEDICATIONS: Outpatient Medications Prior to Visit  Medication Sig Dispense Refill  . AJOVY 225 MG/1.5ML SOSY INJECT 675MG  UNDER THE SKIN  EVERY 3 MONTHS 4.5 mL 11  . alendronate (FOSAMAX) 70 MG tablet TAKE 1 TABLET(70 MG) BY MOUTH EVERY 7 DAYS WITH A FULL GLASS OF WATER AND ON AN EMPTY STOMACH 4 tablet 5  . amLODipine (NORVASC) 5 MG tablet TAKE 1 TABLET BY MOUTH EVERY MORNING AND ONE EVERY NIGHT AT BEDTIME 60 tablet 3  . aspirin 81 MG tablet Take 81 mg by mouth daily.    . benzonatate (TESSALON) 100 MG capsule Take 100 mg by mouth 3 (three) times daily.    . Cholecalciferol (VITAMIN D PO) Take 2,000 Int'l Units by mouth daily.     . clotrimazole-betamethasone (LOTRISONE) cream Apply 1 application topically 2 (two) times daily. 30 g 0  . diclofenac sodium (VOLTAREN) 1 % GEL Apply topically.    [name] Kitchen FERREX 150 150 MG capsule Take 1 tablet by mouth daily.  1  . folic acid (FOLVITE) 408 MCG tablet Take 400 mcg by mouth daily.    . furosemide (LASIX) 80 MG tablet TAKE 1 TABLET(80 MG) BY MOUTH TWICE DAILY 30 tablet 10  . hydrALAZINE (APRESOLINE) 10 MG tablet TAKE 2 TABLETS(20 MG) BY MOUTH TWICE DAILY AS NEEDED FOR SYSTOLIC BLOOD PRESSURE OVER 150 180 tablet 1  . HYDROcodone-acetaminophen (NORCO/VICODIN) 5-325 MG tablet Take 1 tablet by mouth every 6 (six) hours as needed for severe pain. 10 tablet 0  . hydroxychloroquine (PLAQUENIL) 200 MG tablet Take 200 mg by mouth 2 (two) times daily.   0  . L-Methylfolate-B6-B12 (METANX PO) Take 1 tablet by mouth 2 (two) times daily.     [name] Kitchen lidocaine (LIDODERM) 5 % Place 3 patches onto the skin daily. Remove & Discard patch within 12 hours or as directed by MD 90 patch 6  . LORazepam (ATIVAN) 0.5 MG tablet Take 0.5 mg by mouth daily as needed.    . magnesium oxide (MAGNESIUM-OXIDE) 400 (241.3 Mg) MG tablet Take 1 tablet (400 mg total) by mouth daily. 90 tablet 3  . Multiple Vitamin (MULTIVITAMIN) tablet Take 1 tablet by mouth daily.    . naratriptan (AMERGE) 2.5 MG tablet Take 1 tablet (2.5 mg total) by mouth as needed for migraine. Take one (1) tablet at onset of headache; if returns or does not resolve,  may repeat after 4 hours; do not exceed five (5) mg in 24 hours. 10 tablet 11  . Omega-3 Fatty Acids (FISH OIL) 1000 MG CAPS Take 2 capsules by mouth daily.    [name] Kitchen omeprazole (PRILOSEC) 40 MG capsule Take 40 mg by mouth daily.  1  . ondansetron (ZOFRAN-ODT) 4 MG disintegrating tablet DISSOLVE 1 TABLET ON THE TONGUE EVERY 8 HOURS AS  NEEDED FOR NAUSEA OR VOMITING 20 tablet 11  . oxyCODONE-acetaminophen (PERCOCET) 7.5-325 MG tablet Take 1 tablet by mouth every 8 (eight) hours.    . potassium chloride SA (KLOR-CON) 20 MEQ tablet Take 1 tablet (20 mEq total) by mouth 2 (two) times daily. 60 tablet 3  . predniSONE (DELTASONE) 5 MG tablet Take 5 mg by mouth every 3 (three) days. Take one tablet every 3 days    . pregabalin (LYRICA) 200 MG capsule Take 200 mg by mouth daily.    . Rimegepant Sulfate (NURTEC) 75 MG TBDP Take 75 mg by mouth daily as needed (take for abortive therapy of migraine, no more than 1 tablet in 24 hours or 10 per month). 10 tablet 11  . sertraline (ZOLOFT) 100 MG tablet sertraline 100 mg tablet  TAKE 1 TABLET BY MOUTH EVERY DAY    . sertraline (ZOLOFT) 25 MG tablet Take by mouth with 100 mg tablet for total of 125 mg at bedtime.    . Suvorexant (BELSOMRA) 20 MG TABS Take 20 mg by mouth Nightly. Take one tablet at bedtime daily    . tolterodine (DETROL LA) 4 MG 24 hr capsule Take 4 mg by mouth daily.    [name] Kitchen XIIDRA 5 % SOLN Place 1 drop into both eyes 2 (two) times daily.     No facility-administered medications prior to visit.     PAST MEDICAL HISTORY: Past Medical History:  Diagnosis Date  . Arthritis   . Atrophic vaginitis   . Atypical chest pain    thought to be GERD; myoview 05/17/07-no significant ischemia; echo 07/20/11- normal EF=>55%  . Baker's cyst 03/28/12   venous doppler 03/28/12=no thrombus; baker's cyst at left popliteal fossa  . Chest pain   . CIN I (cervical intraepithelial neoplasia I) 1990  . Degenerative disc disease, cervical 2020   severe, C3-4, 4-5, 5-6.  Currently receiving injections by Marie. Vira Blanco of preferred pain management  . Fibroid   . Fibromyalgia   . Hypertension   . Lupus nephritis ([place])   . Migraines   . Neuropathy   . Osteopenia 09/2017   T score -2.0 improved from prior study  . Premature menopause age 38   following chemotherapy for Lupus  . Shingles      PAST SURGICAL HISTORY: Past Surgical History:  Procedure Laterality Date  . cervical fossa injections  2020  . CESAREAN SECTION    . COLPOSCOPY    . HYSTEROSCOPY     Myomectomy  . MYOMECTOMY     Hysteroscopic myomectomy  . NASAL SINUS SURGERY  01/2016  . thumb surg  07/07/2019  . TUBAL LIGATION    . WRIST SURGERY Right 06/21/2020     FAMILY HISTORY: Family History  Problem Relation Age of Onset  . Hypertension Mother   . Asthma Mother   . COPD Mother   . Diabetes Father   . Hypertension Father   . Heart disease Father   . Cancer Father        prostate     SOCIAL HISTORY: Social History   Socioeconomic History  . Marital status: Married    Spouse name: [name]  . Number of children: 2  . Years of education: College  . Highest education level: Not on file  Occupational History    Employer: DISABLED    Comment: disability  Tobacco Use  . Smoking status: Never Smoker  . Smokeless tobacco: Never Used  Vaping Use  . Vaping Use: Never used  Substance  and Sexual Activity  . Alcohol use: No    Alcohol/week: 0.0 standard drinks  . Drug use: No  . Sexual activity: Not Currently    Birth control/protection: Surgical, Post-menopausal    Comment: Tubal lig-1st intercourse 55 yo-Fewer than 5 partners  Other Topics Concern  . Not on file  Social History Narrative   Patient lives at home with her daughter. She is currently separated (not legally).   Caffeine Use: 1 cup of coffee daily.    Right-handed   Social Determinants of Health   Financial Resource Strain: Not on file  Food Insecurity: Not on file  Transportation Needs: Not on file   Physical Activity: Not on file  Stress: Not on file  Social Connections: Not on file  Intimate Partner Violence: Not on file      PHYSICAL EXAM  Vitals:   07/19/20 0731  BP: 134/83  Pulse: 84  Weight: 198 lb (89.8 kg)  Height: 5\' 4"  (1.626 m)   Body mass index is 33.99 kg/m.   Generalized: Well developed, in no acute distress   Neurological examination  Mentation: Alert oriented to time, place, history taking. Follows all commands speech and language fluent Cranial nerve II-XII: Pupils were equal round reactive to light. Extraocular movements were full, visual field were full  Motor: The motor testing reveals 5 over 5 strength of all 4 extremities. Good symmetric motor tone is noted throughout.  Gait and station: Gait is normal.      DIAGNOSTIC DATA (LABS, IMAGING, TESTING) - I reviewed patient records, labs, notes, testing and imaging myself where available.  Lab Results  Component Value Date   WBC 3.6 (L) 07/01/2013   HGB 12.8 07/01/2013   HCT 39.3 07/01/2013   MCV 94.2 07/01/2013   PLT 188 07/01/2013      Component Value Date/Time   NA 142 06/03/2020 0926   NA 142 07/01/2013 1104   K 4.0 06/03/2020 0926   K 4.2 07/01/2013 1104   CL 103 06/03/2020 0926   CO2 24 06/03/2020 0926   CO2 26 07/01/2013 1104   GLUCOSE 69 06/03/2020 0926   GLUCOSE 61 (L) 09/10/2015 0833   GLUCOSE 94 07/01/2013 1104   BUN 11 06/03/2020 0926   BUN 12.7 07/01/2013 1104   CREATININE 0.81 06/03/2020 0926   CREATININE 0.98 09/10/2015 0833   CREATININE 0.9 07/01/2013 1104   CALCIUM 9.0 06/03/2020 0926   CALCIUM 9.5 07/01/2013 1104   PROT 6.8 07/09/2020 0903   PROT 6.7 07/01/2013 1104   ALBUMIN 3.9 07/09/2020 0903   ALBUMIN 3.9 07/01/2013 1104   AST 22 07/09/2020 0903   AST 47 (H) 07/01/2013 1104   ALT 16 07/09/2020 0903   ALT 36 07/01/2013 1104   ALKPHOS 115 07/09/2020 0903   ALKPHOS 80 07/01/2013 1104   BILITOT <0.2 07/09/2020 0903   BILITOT 0.48 07/01/2013 1104    GFRNONAA 83 05/18/2020 1024   GFRAA 96 05/18/2020 1024   Lab Results  Component Value Date   CHOL 165 07/09/2020   HDL 59 07/09/2020   LDLCALC 90 07/09/2020   TRIG 87 07/09/2020   CHOLHDL 2.8 07/09/2020   Lab Results  Component Value Date   HGBA1C 5.4 09/10/2018   Lab Results  Component Value Date   VITAMINB12 >2000 (H) 09/10/2018   Lab Results  Component Value Date   TSH 1.27 09/10/2015      ASSESSMENT AND PLAN  55 y.o. year old female  has a past medical history of Arthritis, Atrophic  vaginitis, Atypical chest pain, Baker's cyst (03/28/12), Chest pain, CIN I (cervical intraepithelial neoplasia I) (1990), Degenerative disc disease, cervical (2020), Fibroid, Fibromyalgia, Hypertension, Lupus nephritis ([place]), Migraines, Neuropathy, Osteopenia (09/2017), Premature menopause (age 89), and Shingles. here with   Migraine without aura and without status migrainosus, not intractable  Depression with anxiety   Casie reports that headaches have improved significantly on Ajovy. She continues to have 4-5 migrainous headaches a month and feels that most occur toward the end of the Ajovy cycle. Arie Sabina is no longer covered on her insurance plan. We will discontinue and start Qulipta. Prescription sent to pharmacy and information submitted to [facility] complete. She will continue Amerge for abortive therapy. She has significant anxiety and depression as well as chronic pain that contributes. She will continue close follow up with psychiatry and PCP.  Healthy lifestyle habits encouraged. She will follow up with me in 6 months.   I spent 20 minutes of face-to-face and non-face-to-face time with patient.  This included previsit chart review, lab review, study review, order entry, electronic health record documentation, patient education.    Debbora Presto, MSN, FNP-C 07/19/2020, 7:37 AM  [facility] [address], [place], [place] [address] [phone]  agree with  assessment and plan as stated.     Sarina Ill, MD [facility]

## 2020-07-15 NOTE — Patient Instructions (Signed)
Below is our plan:  We will discontinue Ajovy. I have called in [place] for prevention therapy. You should hear from Qulipta complete in 24-48 hours. I have provided their number for you to call as needed. You will take 1 tablet everyday. Continue Amerge for abortive therapy. Continue close follow up with your PCP as directed.   Please make sure you are staying well hydrated. I recommend 50-60 ounces daily. Well balanced diet and regular exercise encouraged. Consistent sleep schedule with 6-8 hours recommended.   Please continue follow up with care team as directed.   Follow up with me in 6 months, sooner if needed   You may receive a survey regarding today's visit. I encourage you to leave honest feed back as I do use this information to improve patient care. Thank you for seeing me today!      Migraine Headache A migraine headache is a very strong throbbing pain on one side or both sides of your head. This type of headache can also cause other symptoms. It can last from 4 hours to 3 days. Talk with your doctor about what things may bring on (trigger) this condition. What are the causes? The exact cause of this condition is not known. This condition may be triggered or caused by:  Drinking alcohol.  Smoking.  Taking medicines, such as: ? Medicine used to treat chest pain (nitroglycerin). ? Birth control pills. ? Estrogen. ? Some blood pressure medicines.  Eating or drinking certain products.  Doing physical activity. Other things that may trigger a migraine headache include:  Having a menstrual period.  Pregnancy.  Hunger.  Stress.  Not getting enough sleep or getting too much sleep.  Weather changes.  Tiredness (fatigue). What increases the risk?  Being 73-75 years old.  Being female.  Having a family history of migraine headaches.  Being Caucasian.  Having depression or anxiety.  Being very overweight. What are the signs or symptoms?  A throbbing  pain. This pain may: ? Happen in any area of the head, such as on one side or both sides. ? Make it hard to do daily activities. ? Get worse with physical activity. ? Get worse around bright lights or loud noises.  Other symptoms may include: ? Feeling sick to your stomach (nauseous). ? Vomiting. ? Dizziness. ? Being sensitive to bright lights, loud noises, or smells.  Before you get a migraine headache, you may get warning signs (an aura). An aura may include: ? Seeing flashing lights or having blind spots. ? Seeing bright spots, halos, or zigzag lines. ? Having tunnel vision or blurred vision. ? Having numbness or a tingling feeling. ? Having trouble talking. ? Having weak muscles.  Some people have symptoms after a migraine headache (postdromal phase), such as: ? Tiredness. ? Trouble thinking (concentrating). How is this treated?  Taking medicines that: ? Relieve pain. ? Relieve the feeling of being sick to your stomach. ? Prevent migraine headaches.  Treatment may also include: ? Having acupuncture. ? Avoiding foods that bring on migraine headaches. ? Learning ways to control your body functions (biofeedback). ? Therapy to help you know and deal with negative thoughts (cognitive behavioral therapy). Follow these instructions at home: Medicines  Take over-the-counter and prescription medicines only as told by your doctor.  Ask your doctor if the medicine prescribed to you: ? Requires you to avoid driving or using heavy machinery. ? Can cause trouble pooping (constipation). You may need to take these steps to prevent or treat  trouble pooping:  Drink enough fluid to keep your pee (urine) pale yellow.  Take over-the-counter or prescription medicines.  Eat foods that are high in fiber. These include beans, whole grains, and fresh fruits and vegetables.  Limit foods that are high in fat and sugar. These include fried or sweet foods. Lifestyle  Do not drink  alcohol.  Do not use any products that contain nicotine or tobacco, such as cigarettes, e-cigarettes, and chewing tobacco. If you need help quitting, ask your doctor.  Get at least 8 hours of sleep every night.  Limit and deal with stress. General instructions  Keep a journal to find out what may bring on your migraine headaches. For example, write down: ? What you eat and drink. ? How much sleep you get. ? Any change in what you eat or drink. ? Any change in your medicines.  If you have a migraine headache: ? Avoid things that make your symptoms worse, such as bright lights. ? It may help to lie down in a dark, quiet room. ? Do not drive or use heavy machinery. ? Ask your doctor what activities are safe for you.  Keep all follow-up visits as told by your doctor. This is important.      Contact a doctor if:  You get a migraine headache that is different or worse than others you have had.  You have more than 15 headache days in one month. Get help right away if:  Your migraine headache gets very bad.  Your migraine headache lasts longer than 72 hours.  You have a fever.  You have a stiff neck.  You have trouble seeing.  Your muscles feel weak or like you cannot control them.  You start to lose your balance a lot.  You start to have trouble walking.  You pass out (faint).  You have a seizure. Summary  A migraine headache is a very strong throbbing pain on one side or both sides of your head. These headaches can also cause other symptoms.  This condition may be treated with medicines and changes to your lifestyle.  Keep a journal to find out what may bring on your migraine headaches.  Contact a doctor if you get a migraine headache that is different or worse than others you have had.  Contact your doctor if you have more than 15 headache days in a month. This information is not intended to replace advice given to you by your health care provider. Make sure  you discuss any questions you have with your health care provider. Document Revised: 07/05/2018 Document Reviewed: 04/25/2018 Elsevier Patient Education  [place].

## 2020-07-19 ENCOUNTER — Ambulatory Visit (INDEPENDENT_AMBULATORY_CARE_PROVIDER_SITE_OTHER): Payer: 59 | Admitting: Family Medicine

## 2020-07-19 ENCOUNTER — Encounter: Payer: Self-pay | Admitting: Family Medicine

## 2020-07-19 VITALS — BP 134/83 | HR 84 | Ht 64.0 in | Wt 198.0 lb

## 2020-07-19 DIAGNOSIS — G43009 Migraine without aura, not intractable, without status migrainosus: Secondary | ICD-10-CM

## 2020-07-19 DIAGNOSIS — F418 Other specified anxiety disorders: Secondary | ICD-10-CM | POA: Diagnosis not present

## 2020-07-19 MED ORDER — QULIPTA 60 MG PO TABS: 60.0000 mg | ORAL_TABLET | Freq: Every day | ORAL | 3 refills | Status: DC

## 2020-07-21 DIAGNOSIS — Z79891 Long term (current) use of opiate analgesic: Secondary | ICD-10-CM | POA: Diagnosis not present

## 2020-07-21 DIAGNOSIS — G894 Chronic pain syndrome: Secondary | ICD-10-CM | POA: Diagnosis not present

## 2020-07-21 DIAGNOSIS — G629 Polyneuropathy, unspecified: Secondary | ICD-10-CM | POA: Diagnosis not present

## 2020-07-21 DIAGNOSIS — M5030 Other cervical disc degeneration, unspecified cervical region: Secondary | ICD-10-CM | POA: Diagnosis not present

## 2020-07-21 DIAGNOSIS — Z79899 Other long term (current) drug therapy: Secondary | ICD-10-CM | POA: Diagnosis not present

## 2020-07-21 DIAGNOSIS — M47817 Spondylosis without myelopathy or radiculopathy, lumbosacral region: Secondary | ICD-10-CM | POA: Diagnosis not present

## 2020-08-04 ENCOUNTER — Telehealth: Payer: Self-pay | Admitting: *Deleted

## 2020-08-04 NOTE — Telephone Encounter (Signed)
Qulipta complete enrollment form completed and faxed [phone]. PA done as requested and denied 07-21-20 requested med or diagnosis not a covered benefit.  Received 08-02-20 fax from [place] complete that pt qualifies to receive at no cost. Up to 24 months. It will be shipped to pt by [facility].

## 2020-08-09 ENCOUNTER — Telehealth: Payer: Self-pay | Admitting: *Deleted

## 2020-08-09 DIAGNOSIS — R600 Localized edema: Secondary | ICD-10-CM

## 2020-08-09 NOTE — Telephone Encounter (Signed)
Nurtec PA, key: [id number], [id number]. Medications tried for migraines: Imitrex, Topamax (50 qam and 100mg  qpm, had side effects of memory loss and blurry vision),imitrex. Your information has been sent to [facility].

## 2020-08-10 MED ORDER — POTASSIUM CHLORIDE CRYS ER 20 MEQ PO TBCR: 20.0000 meq | EXTENDED_RELEASE_TABLET | Freq: Two times a day (BID) | ORAL | 3 refills | Status: DC

## 2020-08-11 ENCOUNTER — Encounter: Payer: Self-pay | Admitting: *Deleted

## 2020-08-11 MED ORDER — TORSEMIDE 20 MG PO TABS: 40.0000 mg | ORAL_TABLET | Freq: Two times a day (BID) | ORAL | 3 refills | Status: DC

## 2020-08-11 NOTE — Telephone Encounter (Signed)
Sure, we can stop Lasix and start Torsemide 40mg  twice daily. She will need a repeat BMET in 5-7 days.   Thank you!

## 2020-08-11 NOTE — Telephone Encounter (Signed)
Nurtec Tab 75mg  Odt, use as directed (8 tablets per month), is approved through 08/09/2021. Faxed approval letter to pharmacy. Sent her my chart to inform.

## 2020-08-11 NOTE — Addendum Note (Signed)
Addended by: Caprice Beaver T on: 08/11/2020 01:43 PM   Modules accepted: Orders

## 2020-08-12 DIAGNOSIS — S52571D Other intraarticular fracture of lower end of right radius, subsequent encounter for closed fracture with routine healing: Secondary | ICD-10-CM | POA: Diagnosis not present

## 2020-08-16 ENCOUNTER — Other Ambulatory Visit: Payer: Self-pay

## 2020-08-16 MED ORDER — TORSEMIDE 20 MG PO TABS
40.0000 mg | ORAL_TABLET | Freq: Two times a day (BID) | ORAL | 3 refills | Status: DC
Start: 2020-08-16 — End: 2021-08-08

## 2020-08-17 ENCOUNTER — Ambulatory Visit: Payer: Self-pay

## 2020-08-17 LAB — BASIC METABOLIC PANEL
BUN/Creatinine Ratio: 15 (ref 9–23)
BUN: 13 mg/dL (ref 6–24)
CO2: 24 mmol/L (ref 20–29)
Calcium: 8.7 mg/dL (ref 8.7–10.2)
Chloride: 104 mmol/L (ref 96–106)
Creatinine, Ser: 0.88 mg/dL (ref 0.57–1.00)
Glucose: 81 mg/dL (ref 65–99)
Potassium: 3.8 mmol/L (ref 3.5–5.2)
Sodium: 142 mmol/L (ref 134–144)
eGFR: 78 mL/min/{1.73_m2} (ref >59)

## 2020-08-19 DIAGNOSIS — G629 Polyneuropathy, unspecified: Secondary | ICD-10-CM | POA: Diagnosis not present

## 2020-08-19 DIAGNOSIS — M5030 Other cervical disc degeneration, unspecified cervical region: Secondary | ICD-10-CM | POA: Diagnosis not present

## 2020-08-19 DIAGNOSIS — G894 Chronic pain syndrome: Secondary | ICD-10-CM | POA: Diagnosis not present

## 2020-08-19 DIAGNOSIS — M47817 Spondylosis without myelopathy or radiculopathy, lumbosacral region: Secondary | ICD-10-CM | POA: Diagnosis not present

## 2020-09-01 ENCOUNTER — Telehealth: Payer: Self-pay | Admitting: Student

## 2020-09-01 DIAGNOSIS — M810 Age-related osteoporosis without current pathological fracture: Secondary | ICD-10-CM | POA: Diagnosis not present

## 2020-09-01 DIAGNOSIS — M1990 Unspecified osteoarthritis, unspecified site: Secondary | ICD-10-CM | POA: Diagnosis not present

## 2020-09-01 DIAGNOSIS — I10 Essential (primary) hypertension: Secondary | ICD-10-CM | POA: Diagnosis not present

## 2020-09-01 DIAGNOSIS — R635 Abnormal weight gain: Secondary | ICD-10-CM | POA: Diagnosis not present

## 2020-09-01 DIAGNOSIS — M797 Fibromyalgia: Secondary | ICD-10-CM | POA: Diagnosis not present

## 2020-09-01 DIAGNOSIS — R682 Dry mouth, unspecified: Secondary | ICD-10-CM | POA: Diagnosis not present

## 2020-09-01 DIAGNOSIS — M329 Systemic lupus erythematosus, unspecified: Secondary | ICD-10-CM | POA: Diagnosis not present

## 2020-09-01 DIAGNOSIS — Z79899 Other long term (current) drug therapy: Secondary | ICD-10-CM | POA: Diagnosis not present

## 2020-09-01 DIAGNOSIS — M549 Dorsalgia, unspecified: Secondary | ICD-10-CM | POA: Diagnosis not present

## 2020-09-01 NOTE — Telephone Encounter (Signed)
Pt returning your call.   See mychart message.

## 2020-09-01 NOTE — Telephone Encounter (Signed)
Is she having any other symptoms (shortness of breath, worsening lower extremity edema, etc.)? Also is she watching her sodium intake?  Thank you!

## 2020-09-01 NOTE — Telephone Encounter (Signed)
Let's increase her Torsemide to 80mg  in the morning and 40mg  in the evening for 3 days and then decrease back to 40mg  twice daily. She should also increase her potassium to 86mEq in the morning and 20 mEq in the evening for 3 days while on the higher dose of Torsemide and can then decrease back to 32mEq twice daily.    If weight dose not improve back to recent baseline with this, she should let us know.   Thank you!

## 2020-09-01 NOTE — Telephone Encounter (Signed)
Called pt she is not having SOB she does note some LE/Ankle edema she states that she does watch her sodium intake. Otherwise nothing has changed...please advise.

## 2020-09-02 ENCOUNTER — Other Ambulatory Visit: Payer: Self-pay

## 2020-09-02 NOTE — Telephone Encounter (Signed)
See result note.  

## 2020-09-03 ENCOUNTER — Ambulatory Visit (INDEPENDENT_AMBULATORY_CARE_PROVIDER_SITE_OTHER): Payer: 59 | Admitting: Podiatry

## 2020-09-03 ENCOUNTER — Ambulatory Visit (INDEPENDENT_AMBULATORY_CARE_PROVIDER_SITE_OTHER): Payer: 59

## 2020-09-03 DIAGNOSIS — S92501A Displaced unspecified fracture of right lesser toe(s), initial encounter for closed fracture: Secondary | ICD-10-CM

## 2020-09-03 NOTE — Telephone Encounter (Signed)
Spoke to patient Marie Peterson advice given.Advised to call back if weight does not return to baseline.

## 2020-09-03 NOTE — Progress Notes (Signed)
d 

## 2020-09-07 DIAGNOSIS — M170 Bilateral primary osteoarthritis of knee: Secondary | ICD-10-CM | POA: Diagnosis not present

## 2020-09-08 ENCOUNTER — Encounter: Payer: Self-pay | Admitting: Podiatry

## 2020-09-08 NOTE — Progress Notes (Signed)
Subjective:  Patient ID: Marie Peterson, female    DOB: 03/12/1966,  MRN: [id number]  Chief Complaint  Patient presents with   Foot Injury    Right foot injury to 5th toe  PT stated that on Wednesday night she hit her foot on the corner of her bed and has some pain and swelling and bruising     55 y.o. female presents with the above complaint.  Patient presents with complaint of right fifth toe pain.  Patient states that she hit her foot on the corner of a bed Wednesday night.  The date of injury was September 01, 2020.  She states that it has been painful to touch is sore.  There are some bruising and swelling has not gotten better they wanted get evaluated make sure that there is no fracture present.  She has a history of second toe fracture as well.  She denies any other acute complaints.   Review of Systems: Negative except as noted in the HPI. Denies N/V/F/Ch.  Past Medical History:  Diagnosis Date   Arthritis    Atrophic vaginitis    Atypical chest pain    thought to be GERD; myoview 05/17/07-no significant ischemia; echo 07/20/11- normal EF=>55%   Baker's cyst 03/28/12   venous doppler 03/28/12=no thrombus; baker's cyst at left popliteal fossa   Chest pain    CIN I (cervical intraepithelial neoplasia I) 1990   Degenerative disc disease, cervical 2020   severe, C3-4, 4-5, 5-6. Currently receiving injections by Dr. Vira Blanco of preferred pain management   Fibroid    Fibromyalgia    Hypertension    Lupus nephritis ([place])    Migraines    Neuropathy    Osteopenia 09/2017   T score -2.0 improved from prior study   Premature menopause age 88   following chemotherapy for Lupus   Shingles     Current Outpatient Medications:    AJOVY 225 MG/1.5ML SOSY, INJECT 675MG  UNDER THE SKIN EVERY 3 MONTHS, Disp: 4.5 mL, Rfl: 11   alendronate (FOSAMAX) 70 MG tablet, TAKE 1 TABLET(70 MG) BY MOUTH EVERY 7 DAYS WITH A FULL GLASS OF WATER AND ON AN EMPTY STOMACH, Disp: 4 tablet, Rfl: 5   amLODipine (NORVASC)  5 MG tablet, TAKE 1 TABLET BY MOUTH EVERY MORNING AND ONE EVERY NIGHT AT BEDTIME, Disp: 60 tablet, Rfl: 3   aspirin 81 MG tablet, Take 81 mg by mouth daily., Disp: , Rfl:    Atogepant (QULIPTA) 60 MG TABS, Take 60 mg by mouth daily., Disp: 90 tablet, Rfl: 3   benzonatate (TESSALON) 100 MG capsule, Take 100 mg by mouth 3 (three) times daily., Disp: , Rfl:    Cholecalciferol (VITAMIN D PO), Take 2,000 Int'l Units by mouth daily. , Disp: , Rfl:    clotrimazole-betamethasone (LOTRISONE) cream, Apply 1 application topically 2 (two) times daily., Disp: 30 g, Rfl: 0   diclofenac sodium (VOLTAREN) 1 % GEL, Apply topically., Disp: , Rfl:    FERREX 150 150 MG capsule, Take 1 tablet by mouth daily., Disp: , Rfl: 1   folic acid (FOLVITE) 409 MCG tablet, Take 400 mcg by mouth daily., Disp: , Rfl:    hydrALAZINE (APRESOLINE) 10 MG tablet, TAKE 2 TABLETS(20 MG) BY MOUTH TWICE DAILY AS NEEDED FOR SYSTOLIC BLOOD PRESSURE OVER 150, Disp: 180 tablet, Rfl: 1   HYDROcodone-acetaminophen (NORCO/VICODIN) 5-325 MG tablet, Take 1 tablet by mouth every 6 (six) hours as needed for severe pain., Disp: 10 tablet, Rfl: 0   hydroxychloroquine (  PLAQUENIL) 200 MG tablet, Take 200 mg by mouth 2 (two) times daily. , Disp: , Rfl: 0   L-Methylfolate-B6-B12 (METANX PO), Take 1 tablet by mouth 2 (two) times daily. , Disp: , Rfl:    lidocaine (LIDODERM) 5 %, Place 3 patches onto the skin daily. Remove & Discard patch within 12 hours or as directed by MD, Disp: 90 patch, Rfl: 6   LORazepam (ATIVAN) 0.5 MG tablet, Take 0.5 mg by mouth daily as needed., Disp: , Rfl:    magnesium oxide (MAGNESIUM-OXIDE) 400 (241.3 Mg) MG tablet, Take 1 tablet (400 mg total) by mouth daily., Disp: 90 tablet, Rfl: 3   Multiple Vitamin (MULTIVITAMIN) tablet, Take 1 tablet by mouth daily., Disp: , Rfl:    naratriptan (AMERGE) 2.5 MG tablet, Take 1 tablet (2.5 mg total) by mouth as needed for migraine. Take one (1) tablet at onset of headache; if returns or does  not resolve, may repeat after 4 hours; do not exceed five (5) mg in 24 hours., Disp: 10 tablet, Rfl: 11   Omega-3 Fatty Acids (FISH OIL) 1000 MG CAPS, Take 2 capsules by mouth daily., Disp: , Rfl:    omeprazole (PRILOSEC) 40 MG capsule, Take 40 mg by mouth daily., Disp: , Rfl: 1   ondansetron (ZOFRAN-ODT) 4 MG disintegrating tablet, DISSOLVE 1 TABLET ON THE TONGUE EVERY 8 HOURS AS NEEDED FOR NAUSEA OR VOMITING, Disp: 20 tablet, Rfl: 11   oxyCODONE-acetaminophen (PERCOCET) 7.5-325 MG tablet, Take 1 tablet by mouth every 8 (eight) hours., Disp: , Rfl:    potassium chloride SA (KLOR-CON) 20 MEQ tablet, Take 1 tablet (20 mEq total) by mouth 2 (two) times daily., Disp: 180 tablet, Rfl: 3   predniSONE (DELTASONE) 5 MG tablet, Take 5 mg by mouth every 3 (three) days. Take one tablet every 3 days, Disp: , Rfl:    pregabalin (LYRICA) 200 MG capsule, Take 200 mg by mouth daily., Disp: , Rfl:    Rimegepant Sulfate (NURTEC) 75 MG TBDP, Take 75 mg by mouth daily as needed (take for abortive therapy of migraine, no more than 1 tablet in 24 hours or 10 per month)., Disp: 10 tablet, Rfl: 11   sertraline (ZOLOFT) 100 MG tablet, sertraline 100 mg tablet  TAKE 1 TABLET BY MOUTH EVERY DAY, Disp: , Rfl:    sertraline (ZOLOFT) 25 MG tablet, Take by mouth with 100 mg tablet for total of 125 mg at bedtime., Disp: , Rfl:    Suvorexant (BELSOMRA) 20 MG TABS, Take 20 mg by mouth Nightly. Take one tablet at bedtime daily, Disp: , Rfl:    tolterodine (DETROL LA) 4 MG 24 hr capsule, Take 4 mg by mouth daily., Disp: , Rfl:    torsemide (DEMADEX) 20 MG tablet, Take 2 tablets (40 mg total) by mouth 2 (two) times daily. Change in quantity to see if ins will cover. Thank you., Disp: 360 tablet, Rfl: 3   XIIDRA 5 % SOLN, Place 1 drop into both eyes 2 (two) times daily., Disp: , Rfl:   Social History   Tobacco Use  Smoking Status Never  Smokeless Tobacco Never    Allergies  Allergen Reactions   Ubrogepant Swelling    Lip  swelling    Benazepril     Lip swelling   Cymbalta [Duloxetine Hcl]     Lip swelling   Hygroton [Chlorthalidone] Swelling    Swelling in lips   Mirabegron Itching    Other reaction(s): rash   Other     Other  reaction(s): Unknown   Duloxetine Rash    Lip swelling   Objective:  There were no vitals filed for this visit. There is no height or weight on file to calculate BMI. Constitutional Well developed. Well nourished.  Vascular Dorsalis pedis pulses palpable bilaterally. Posterior tibial pulses palpable bilaterally. Capillary refill normal to all digits.  No cyanosis or clubbing noted. Pedal hair growth normal.  Neurologic Normal speech. Oriented to person, place, and time. Epicritic sensation to light touch grossly present bilaterally.  Dermatologic Nails well groomed and normal in appearance. No open wounds. No skin lesions.  Orthopedic: Pain on palpation of right fifth digit.  Pain with range of motion of the PIPJ joint.  No pain of the metatarsophalangeal joint.  No gross abnormalities noted.   Radiographs: 3 views of skeletally mature adult right foot: Proximal phalanx fracture noted to the right fifth digit.  Well aligned no angulation or shortening noted.  No malalignment present. Assessment:   1. Closed fracture of phalanx of lesser toe of right foot, physeal involvement unspecified, unspecified phalanx, initial encounter    Plan:  Patient was evaluated and treated and all questions answered.  Right fifth digit proximal phalanx fracture -I explained the patient the etiology of fracture versus treatment options were extensively discussed.  Given that this is well aligned nondisplaced patient does not need any surgical intervention at this time.  She states understanding. -I discussed surgical shoe immobilization.  Surgical shoe was dispensed -Buddy splinting was discussed and shown to the patient.  No follow-ups on file.

## 2020-09-09 DIAGNOSIS — S52571D Other intraarticular fracture of lower end of right radius, subsequent encounter for closed fracture with routine healing: Secondary | ICD-10-CM | POA: Diagnosis not present

## 2020-09-16 DIAGNOSIS — G894 Chronic pain syndrome: Secondary | ICD-10-CM | POA: Diagnosis not present

## 2020-09-16 DIAGNOSIS — M329 Systemic lupus erythematosus, unspecified: Secondary | ICD-10-CM | POA: Diagnosis not present

## 2020-09-16 DIAGNOSIS — M47817 Spondylosis without myelopathy or radiculopathy, lumbosacral region: Secondary | ICD-10-CM | POA: Diagnosis not present

## 2020-09-16 DIAGNOSIS — M47812 Spondylosis without myelopathy or radiculopathy, cervical region: Secondary | ICD-10-CM | POA: Diagnosis not present

## 2020-10-02 ENCOUNTER — Other Ambulatory Visit: Payer: Self-pay | Admitting: Cardiovascular Disease

## 2020-10-15 ENCOUNTER — Ambulatory Visit (INDEPENDENT_AMBULATORY_CARE_PROVIDER_SITE_OTHER): Payer: 59 | Admitting: Podiatry

## 2020-10-15 ENCOUNTER — Encounter: Payer: Self-pay | Admitting: Podiatry

## 2020-10-15 ENCOUNTER — Other Ambulatory Visit: Payer: Self-pay

## 2020-10-15 DIAGNOSIS — S92501A Displaced unspecified fracture of right lesser toe(s), initial encounter for closed fracture: Secondary | ICD-10-CM | POA: Diagnosis not present

## 2020-10-15 NOTE — Progress Notes (Signed)
Subjective:  Patient ID: Marie Peterson, female    DOB: June 13, 1965,  MRN: [id number]  Chief Complaint  Patient presents with   Fracture    PT stated that she is doing well no pain and no new concerns  She stated that she still has some swelling     55 y.o. female presents with the above complaint.  Patient presents with follow-up to right fifth digit fracture.  She states she is doing a lot better.  No pain no concern she is able to ambulate in her shoes sometimes.  She is still wearing surgical shoe.  She denies any other acute complaints.   Review of Systems: Negative except as noted in the HPI. Denies N/V/F/Ch.  Past Medical History:  Diagnosis Date   Arthritis    Atrophic vaginitis    Atypical chest pain    thought to be GERD; myoview 05/17/07-no significant ischemia; echo 07/20/11- normal EF=>55%   Baker's cyst 03/28/12   venous doppler 03/28/12=no thrombus; baker's cyst at left popliteal fossa   Chest pain    CIN I (cervical intraepithelial neoplasia I) 1990   Degenerative disc disease, cervical 2020   severe, C3-4, 4-5, 5-6. Currently receiving injections by Dr. Vira Blanco of preferred pain management   Fibroid    Fibromyalgia    Hypertension    Lupus nephritis ([place])    Migraines    Neuropathy    Osteopenia 09/2017   T score -2.0 improved from prior study   Premature menopause age 6   following chemotherapy for Lupus   Shingles     Current Outpatient Medications:    AJOVY 225 MG/1.5ML SOSY, INJECT '675MG'$  UNDER THE SKIN EVERY 3 MONTHS, Disp: 4.5 mL, Rfl: 11   alendronate (FOSAMAX) 70 MG tablet, TAKE 1 TABLET(70 MG) BY MOUTH EVERY 7 DAYS WITH A FULL GLASS OF WATER AND ON AN EMPTY STOMACH, Disp: 4 tablet, Rfl: 5   amLODipine (NORVASC) 5 MG tablet, TAKE 1 TABLET BY MOUTH EVERY MORNING AND EVERY NIGHT AT BEDTIME, Disp: 60 tablet, Rfl: 3   aspirin 81 MG tablet, Take 81 mg by mouth daily., Disp: , Rfl:    Atogepant (QULIPTA) 60 MG TABS, Take 60 mg by mouth daily., Disp: 90 tablet,  Rfl: 3   benzonatate (TESSALON) 100 MG capsule, Take 100 mg by mouth 3 (three) times daily., Disp: , Rfl:    Cholecalciferol (VITAMIN D PO), Take 2,000 Int'l Units by mouth daily. , Disp: , Rfl:    clotrimazole-betamethasone (LOTRISONE) cream, Apply 1 application topically 2 (two) times daily., Disp: 30 g, Rfl: 0   diclofenac sodium (VOLTAREN) 1 % GEL, Apply topically., Disp: , Rfl:    FERREX 150 150 MG capsule, Take 1 tablet by mouth daily., Disp: , Rfl: 1   folic acid (FOLVITE) A999333 MCG tablet, Take 400 mcg by mouth daily., Disp: , Rfl:    hydrALAZINE (APRESOLINE) 10 MG tablet, TAKE 2 TABLETS(20 MG) BY MOUTH TWICE DAILY AS NEEDED FOR SYSTOLIC BLOOD PRESSURE OVER 150, Disp: 180 tablet, Rfl: 1   HYDROcodone-acetaminophen (NORCO/VICODIN) 5-325 MG tablet, Take 1 tablet by mouth every 6 (six) hours as needed for severe pain., Disp: 10 tablet, Rfl: 0   hydroxychloroquine (PLAQUENIL) 200 MG tablet, Take 200 mg by mouth 2 (two) times daily. , Disp: , Rfl: 0   L-Methylfolate-B6-B12 (METANX PO), Take 1 tablet by mouth 2 (two) times daily. , Disp: , Rfl:    lidocaine (LIDODERM) 5 %, Place 3 patches onto the skin daily. Remove &  Discard patch within 12 hours or as directed by MD, Disp: 90 patch, Rfl: 6   LORazepam (ATIVAN) 0.5 MG tablet, Take 0.5 mg by mouth daily as needed., Disp: , Rfl:    magnesium oxide (MAGNESIUM-OXIDE) 400 (241.3 Mg) MG tablet, Take 1 tablet (400 mg total) by mouth daily., Disp: 90 tablet, Rfl: 3   Multiple Vitamin (MULTIVITAMIN) tablet, Take 1 tablet by mouth daily., Disp: , Rfl:    naratriptan (AMERGE) 2.5 MG tablet, Take 1 tablet (2.5 mg total) by mouth as needed for migraine. Take one (1) tablet at onset of headache; if returns or does not resolve, may repeat after 4 hours; do not exceed five (5) mg in 24 hours., Disp: 10 tablet, Rfl: 11   Omega-3 Fatty Acids (FISH OIL) 1000 MG CAPS, Take 2 capsules by mouth daily., Disp: , Rfl:    omeprazole (PRILOSEC) 40 MG capsule, Take 40 mg by  mouth daily., Disp: , Rfl: 1   ondansetron (ZOFRAN-ODT) 4 MG disintegrating tablet, DISSOLVE 1 TABLET ON THE TONGUE EVERY 8 HOURS AS NEEDED FOR NAUSEA OR VOMITING, Disp: 20 tablet, Rfl: 11   oxyCODONE-acetaminophen (PERCOCET) 7.5-325 MG tablet, Take 1 tablet by mouth every 8 (eight) hours., Disp: , Rfl:    potassium chloride SA (KLOR-CON) 20 MEQ tablet, Take 1 tablet (20 mEq total) by mouth 2 (two) times daily., Disp: 180 tablet, Rfl: 3   predniSONE (DELTASONE) 5 MG tablet, Take 5 mg by mouth every 3 (three) days. Take one tablet every 3 days, Disp: , Rfl:    pregabalin (LYRICA) 200 MG capsule, Take 200 mg by mouth daily., Disp: , Rfl:    Rimegepant Sulfate (NURTEC) 75 MG TBDP, Take 75 mg by mouth daily as needed (take for abortive therapy of migraine, no more than 1 tablet in 24 hours or 10 per month)., Disp: 10 tablet, Rfl: 11   sertraline (ZOLOFT) 100 MG tablet, sertraline 100 mg tablet  TAKE 1 TABLET BY MOUTH EVERY DAY, Disp: , Rfl:    sertraline (ZOLOFT) 25 MG tablet, Take by mouth with 100 mg tablet for total of 125 mg at bedtime., Disp: , Rfl:    Suvorexant (BELSOMRA) 20 MG TABS, Take 20 mg by mouth Nightly. Take one tablet at bedtime daily, Disp: , Rfl:    tolterodine (DETROL LA) 4 MG 24 hr capsule, Take 4 mg by mouth daily., Disp: , Rfl:    torsemide (DEMADEX) 20 MG tablet, Take 2 tablets (40 mg total) by mouth 2 (two) times daily. Change in quantity to see if ins will cover. Thank you., Disp: 360 tablet, Rfl: 3   XIIDRA 5 % SOLN, Place 1 drop into both eyes 2 (two) times daily., Disp: , Rfl:   Social History   Tobacco Use  Smoking Status Never  Smokeless Tobacco Never    Allergies  Allergen Reactions   Ubrogepant Swelling    Lip swelling    Benazepril     Lip swelling   Cymbalta [Duloxetine Hcl]     Lip swelling   Hygroton [Chlorthalidone] Swelling    Swelling in lips   Mirabegron Itching    Other reaction(s): rash   Other     Other reaction(s): Unknown   Duloxetine Rash     Lip swelling   Objective:  There were no vitals filed for this visit. There is no height or weight on file to calculate BMI. Constitutional Well developed. Well nourished.  Vascular Dorsalis pedis pulses palpable bilaterally. Posterior tibial pulses palpable bilaterally. Capillary  refill normal to all digits.  No cyanosis or clubbing noted. Pedal hair growth normal.  Neurologic Normal speech. Oriented to person, place, and time. Epicritic sensation to light touch grossly present bilaterally.  Dermatologic Nails well groomed and normal in appearance. No open wounds. No skin lesions.  Orthopedic: No pain on palpation of right fifth digit.  No pain with range of motion of the PIPJ joint.  No pain of the metatarsophalangeal joint.  No gross abnormalities noted.   Radiographs: 3 views of skeletally mature adult right foot: Proximal phalanx fracture noted to the right fifth digit.  Well aligned no angulation or shortening noted.  No malalignment present. Assessment:   1. Closed fracture of phalanx of lesser toe of right foot, physeal involvement unspecified, unspecified phalanx, initial encounter     Plan:  Patient was evaluated and treated and all questions answered.  Right fifth digit proximal phalanx fracture -Clinically healed.  No further pain with range of motion.  At this time patient can transition to regular shoes.  If any foot and ankle issues arise in the future I have asked her to come see me.  Also discussed shoe gear modification not a lot of stress on the fifth meta digit.  She states understanding.  No follow-ups on file.

## 2020-10-19 NOTE — Progress Notes (Signed)
   Marie Peterson 03-26-1966 [id number]  History:  55 y.o. G2P2002 presents for annual exam. Postmenopausal - no HRT, no bleeding. Does have mild menopausal symptoms. 1990 CIN-1, subsequent paps normal. On Fosamax since 2017 for worsening osteopenia and prednisone use for SLE. Most recent T-score in 2021 was -1.8. History of uterine fibroids. History of SLE, HTN.   Gynecologic History No LMP recorded. Patient is postmenopausal.   Contraception/Family planning: post menopausal status Sexually active: Yes  Health Maintenance Last Pap: 10/04/2017. Results were: Normal, 3-year interval Last mammogram: 01/21/2020. Results were: Normal Last colonoscopy: 2017. Normal Last Dexa: 11/04/2019. Results were: T-score -1.8 of spine  Past medical history, past surgical history, family history and social history were all reviewed and documented in the EPIC chart. Married. 2 daughters ages 56 and 34, both living in [place].   ROS:  A ROS was performed and pertinent positives and negatives are included.  Exam:  Vitals:   10/20/20 0900  BP: 132/76  Weight: 205 lb (93 kg)  Height: '5\' 3"'$  (1.6 m)   Body mass index is 36.31 kg/m.  General appearance:  Normal Thyroid:  Symmetrical, normal in size, without palpable masses or nodularity. Respiratory  Auscultation:  Clear without wheezing or rhonchi Cardiovascular  Auscultation:  Regular rate, without rubs, murmurs or gallops  Edema/varicosities:  Not grossly evident Abdominal  Soft,nontender, without masses, guarding or rebound.  Liver/spleen:  No organomegaly noted  Hernia:  None appreciated  Skin  Inspection:  Grossly normal Breasts: Examined lying and sitting.   Right: Without masses, retractions, nipple discharge or axillary adenopathy.   Left: Without masses, retractions, nipple discharge or axillary adenopathy. Genitourinary   Inguinal/mons:  Normal without inguinal adenopathy  External genitalia:  Normal appearing vulva with no masses,  tenderness, or lesions  BUS/Urethra/Skene's glands:  Normal  Vagina:  Atrophic changes  Cervix:  Atrophic changes  Uterus:  Normal in size, shape and contour.  Midline and mobile, nontender  Adnexa/parametria:     Rt: Normal in size, without masses or tenderness.   Lt: Normal in size, without masses or tenderness.  Anus and perineum: Normal  Digital rectal exam: Normal sphincter tone without palpated masses or tenderness  Patient informed chaperone available to be present for breast and pelvic exam. Patient has requested no chaperone to be present. Patient has been advised what will be completed during breast and pelvic exam.   Assessment/Plan:  55 y.o. [id number] for annual exam.   Well female exam with routine gynecological exam - Plan: Cytology - PAP( [place]). Education provided on SBEs, importance of preventative screenings, current guidelines, high calcium diet, regular exercise, and multivitamin daily. Labs with PCP.   Postmenopausal - no HRT, no bleeding. Occasional hot flashes.   Osteopenia of spine - Plan: alendronate (FOSAMAX) 70 MG tablet weekly. Most recent T-score -1.8 of spine 10/2019. Started on Fosamax in 2017. Recommend repeating Dexa next year and if she remains stable or shows improvement we discussed a drug holiday. She is agreeable. She does have SLE and occasional prednisone use.   Screening for cervical cancer - 1990 CIN-1, subsequent paps normal. Pap today.   Screening for breast cancer - Normal mammogram history.  Continue annual screenings.  Normal breast exam today.  Screening for colon cancer - 2017 colonoscopy. Will repeat at GI's recommended interval.   Return in 1 year for annual.   Tamela Gammon DNP, 9:19 AM 10/20/2020

## 2020-10-20 ENCOUNTER — Ambulatory Visit (INDEPENDENT_AMBULATORY_CARE_PROVIDER_SITE_OTHER): Payer: 59 | Admitting: Nurse Practitioner

## 2020-10-20 ENCOUNTER — Other Ambulatory Visit (HOSPITAL_COMMUNITY)
Admission: RE | Admit: 2020-10-20 | Discharge: 2020-10-20 | Disposition: A | Discharge status: Home / Self Care | Payer: 59 | Source: Ambulatory Visit | Attending: Nurse Practitioner | Admitting: Nurse Practitioner

## 2020-10-20 ENCOUNTER — Encounter: Payer: Self-pay | Admitting: Nurse Practitioner

## 2020-10-20 ENCOUNTER — Other Ambulatory Visit: Payer: Self-pay

## 2020-10-20 VITALS — BP 132/76 | Ht 63.0 in | Wt 205.0 lb

## 2020-10-20 DIAGNOSIS — Z01419 Encounter for gynecological examination (general) (routine) without abnormal findings: Secondary | ICD-10-CM

## 2020-10-20 DIAGNOSIS — Z780 Asymptomatic menopausal state: Secondary | ICD-10-CM

## 2020-10-20 DIAGNOSIS — M8588 Other specified disorders of bone density and structure, other site: Secondary | ICD-10-CM | POA: Diagnosis not present

## 2020-10-20 MED ORDER — ALENDRONATE SODIUM 70 MG PO TABS: 70.0000 mg | ORAL_TABLET | ORAL | 4 refills | Status: DC

## 2020-10-21 LAB — CYTOLOGY - PAP: Diagnosis: NEGATIVE

## 2020-10-29 ENCOUNTER — Encounter: Payer: Self-pay | Admitting: Family Medicine

## 2020-11-01 ENCOUNTER — Other Ambulatory Visit: Payer: Self-pay | Admitting: *Deleted

## 2020-11-01 MED ORDER — NURTEC 75 MG PO TBDP: 75.0000 mg | ORAL_TABLET | Freq: Every day | ORAL | 11 refills | Status: DC | PRN

## 2020-11-03 ENCOUNTER — Telehealth: Payer: Self-pay

## 2020-11-03 NOTE — Telephone Encounter (Signed)
Your information has been sent to [facility]. (Key: [id number])  [facility] is reviewing your PA request. Typically an electronic response will be received within 24-72 hours. To check for an update later, open this request from your dashboard.  You may close this dialog and return to your dashboard to perform other tasks.

## 2020-11-08 NOTE — Progress Notes (Signed)
Cardiology Office Note:    Date:  11/18/2020   ID:  Marie Peterson, DOB 1965-04-18, MRN [id number]  PCP:  [name], MD  Cardiologist:  Quay Burow, MD  Electrophysiologist:  None   Referring MD: [name], MD   Chief Complaint: follow-up up of CHF  History of Present Illness:    Marie Peterson is a 55 y.o. female with a history of atypical chest pain with normal stress test in 10/2014, chronic CHF with preserved EF, sinus bradycardia noted on monitor in 2018,  hypertension, hyperlipidemia, GERD, SLE with lupus nephritis, fibromyalgia, migraines, arthritis, and chronic pain who is followed by Dr. Gwenlyn Found and presents today for follow-up of CHF.   Patient has history of atypical chest pain. She underwent ETT in 10/2014 which was normal. She was noted to be bradycardic in fall of 2018. Monitor in 12/2016 showed sinus bradycardia with rates in the 40's to 60's. Echo showed LVEF of 60-65% with normal wall motion, normal diastolic function, trace MR, and trace TR. She had some fatigue with this but was otherwise asymptomatic. Patient was seen by Dr. Gwenlyn Found in 07/2019 at which time she reported being under a lot of stress at home but denied any chest pain or shortness of breath. Her BP was mildly elevated and her Amlodipine was increased. Since then, she has been seen multiple times in the [facility] Hypertension clinic.   I saw the patient on 04/2020 for further evaluation of dyspnea on exertion, lower extremity edema, and weight gain. She also reported chest heaviness on exertion preceded by shortness of breath. BNP was normal. Lasix was increased to $RemoveBefo'80mg'LiRKKWPGBgl$  twice daily. Echo was ordered and showed LVEF of 60-65% with normal wall motion and mild LVH. Chest discomfort was not felt to be due to ACS. Plan was to follow-up in a month and if still present could do coronary CTA. I saw the patient again in 06/2020 at which time she was doing much better. Chest discomfort had resolved and shortness of breath and edema had  improved. Since last visit she was switched to Torsemide due to weight gain and lower extremity edema.  Patient presents today for follow-up.  Here alone.  Patient states she does not feel like her torsemide is helping much as she continues to gain weight.  She states that is much.  She brings in a great log of her weights dating back to April 2022. Weight was around 190-195 in April 2022 and has gradually trended up to around 200-205 lbs now with some fluctuation here and there. Wonder if weight gain is not actually from fluid but from patient not being able to be as active due to dyspnea. Patient reports significant dyspnea on exertion with minimal activity such as making her bed and cleaning around her house.  She states she has to stop and catch her breath during these activities.  No chest pain.  No orthopnea or PND.  She does have chronic lower extremity edema which is stable and improves with compression stockings.  No palpitations.  She reports occasional mild lightheadedness and dizziness with position changes and sometimes with ambulating. No syncope. She is lightheaded today.  BP initially 80/60 in the office.  However, on my recheck it was 102/68.  Past Medical History:  Diagnosis Date   Anxiety    Arthritis    Atrophic vaginitis    Atypical chest pain    thought to be GERD; myoview 05/17/07-no significant ischemia; echo 07/20/11- normal EF=>55%   Baker's  cyst 03/28/2012   venous doppler 03/28/12=no thrombus; baker's cyst at left popliteal fossa   Chest pain    CIN I (cervical intraepithelial neoplasia I) 1990   Degenerative disc disease, cervical 2020   severe, C3-4, 4-5, 5-6. Currently receiving injections by Dr. Vira Blanco of preferred pain management   Depression    Fibroid    Fibromyalgia    Hypertension    Lupus nephritis ([place])    Migraines    Neuropathy    Osteopenia 09/2017   T score -2.0 improved from prior study   Premature menopause age 32   following chemotherapy for Lupus    Shingles     Past Surgical History:  Procedure Laterality Date   cervical fossa injections  2020   CESAREAN SECTION     COLPOSCOPY     HYSTEROSCOPY     Myomectomy   MYOMECTOMY     Hysteroscopic myomectomy   NASAL SINUS SURGERY  01/2016   thumb surg  07/07/2019   TUBAL LIGATION     WRIST SURGERY Right 06/21/2020    Current Medications: Current Meds  Medication Sig   AJOVY 225 MG/1.5ML SOSY INJECT $RemoveBef'675MG'kOxGiaRiMu$  UNDER THE SKIN EVERY 3 MONTHS   alendronate (FOSAMAX) 70 MG tablet Take 1 tablet (70 mg total) by mouth once a week. Take with a full glass of water on an empty stomach.   amLODipine (NORVASC) 5 MG tablet TAKE 1 TABLET BY MOUTH EVERY MORNING AND EVERY NIGHT AT BEDTIME   aspirin 81 MG tablet Take 81 mg by mouth daily.   Atogepant (QULIPTA) 60 MG TABS Take 60 mg by mouth daily.   benzonatate (TESSALON) 100 MG capsule Take 100 mg by mouth 3 (three) times daily.   Cholecalciferol (VITAMIN D PO) Take 2,000 Int'l Units by mouth daily.    clotrimazole-betamethasone (LOTRISONE) cream Apply 1 application topically 2 (two) times daily.   diclofenac sodium (VOLTAREN) 1 % GEL Apply topically.   FERREX 150 150 MG capsule Take 1 tablet by mouth daily.   folic acid (FOLVITE) 458 MCG tablet Take 400 mcg by mouth daily.   hydrALAZINE (APRESOLINE) 10 MG tablet TAKE 2 TABLETS(20 MG) BY MOUTH TWICE DAILY AS NEEDED FOR SYSTOLIC BLOOD PRESSURE OVER 150   HYDROcodone-acetaminophen (NORCO/VICODIN) 5-325 MG tablet Take 1 tablet by mouth every 6 (six) hours as needed for severe pain.   hydroxychloroquine (PLAQUENIL) 200 MG tablet Take 200 mg by mouth 2 (two) times daily.    L-Methylfolate-B6-B12 (METANX PO) Take 1 tablet by mouth 2 (two) times daily.    lidocaine (LIDODERM) 5 % Place 3 patches onto the skin daily. Remove & Discard patch within 12 hours or as directed by MD   LORazepam (ATIVAN) 0.5 MG tablet Take 0.5 mg by mouth daily as needed.   magnesium oxide (MAGNESIUM-OXIDE) 400 (241.3 Mg) MG tablet  Take 1 tablet (400 mg total) by mouth daily.   Multiple Vitamin (MULTIVITAMIN) tablet Take 1 tablet by mouth daily.   naratriptan (AMERGE) 2.5 MG tablet Take 1 tablet (2.5 mg total) by mouth as needed for migraine. Take one (1) tablet at onset of headache; if returns or does not resolve, may repeat after 4 hours; do not exceed five (5) mg in 24 hours.   Omega-3 Fatty Acids (FISH OIL) 1000 MG CAPS Take 2 capsules by mouth daily.   omeprazole (PRILOSEC) 40 MG capsule Take 40 mg by mouth daily.   ondansetron (ZOFRAN-ODT) 4 MG disintegrating tablet DISSOLVE 1 TABLET ON THE TONGUE EVERY 8 HOURS AS  NEEDED FOR NAUSEA OR VOMITING   oxyCODONE-acetaminophen (PERCOCET) 7.5-325 MG tablet Take 1 tablet by mouth every 8 (eight) hours.   potassium chloride SA (KLOR-CON) 20 MEQ tablet Take 1 tablet (20 mEq total) by mouth 2 (two) times daily.   predniSONE (DELTASONE) 5 MG tablet Take 5 mg by mouth every 3 (three) days. Take one tablet every 3 days   pregabalin (LYRICA) 200 MG capsule Take 200 mg by mouth daily.   Rimegepant Sulfate (NURTEC) 75 MG TBDP Take 75 mg by mouth daily as needed (take for abortive therapy of migraine, no more than 1 tablet in 24 hours or 8 per month).   sertraline (ZOLOFT) 100 MG tablet sertraline 100 mg tablet  TAKE 1 TABLET BY MOUTH EVERY DAY   sertraline (ZOLOFT) 25 MG tablet Take by mouth with 100 mg tablet for total of 125 mg at bedtime.   Suvorexant (BELSOMRA) 20 MG TABS Take 20 mg by mouth Nightly. Take one tablet at bedtime daily   tiZANidine (ZANAFLEX) 4 MG tablet 1 tablet as needed   tolterodine (DETROL LA) 4 MG 24 hr capsule Take 4 mg by mouth daily.   XIIDRA 5 % SOLN Place 1 drop into both eyes 2 (two) times daily.     Allergies:   Ubrogepant, Benazepril, Cymbalta [duloxetine hcl], Hygroton [chlorthalidone], Mirabegron, Other, and Duloxetine   Social History   Socioeconomic History   Marital status: Married    Spouse name: [name]   Number of children: 2   Years of  education: College   Highest education level: Not on file  Occupational History    Employer: DISABLED    Comment: disability  Tobacco Use   Smoking status: Never   Smokeless tobacco: Never  Vaping Use   Vaping Use: Never used  Substance and Sexual Activity   Alcohol use: No    Alcohol/week: 0.0 standard drinks   Drug use: No   Sexual activity: Not Currently    Birth control/protection: Surgical, Post-menopausal    Comment: Tubal lig-1st intercourse 55 yo-Fewer than 5 partners  Other Topics Concern   Not on file  Social History Narrative   Patient lives at home with her daughter. She is currently separated (not legally).   Caffeine Use: 1 cup of coffee daily.    Right-handed   Social Determinants of Radio broadcast assistant Strain: Not on file  Food Insecurity: Not on file  Transportation Needs: Not on file  Physical Activity: Not on file  Stress: Not on file  Social Connections: Not on file     Family History: The patient's family history includes Asthma in her mother; COPD in her mother; Cancer in her father; Diabetes in her father; Heart disease in her father; Hypertension in her father and mother.  ROS:   Please see the history of present illness.      EKGs/Labs/Other Studies Reviewed:    The following studies were reviewed today:  Exercise Tolerance Test 11/12/2014: Upsloping ST segment depression ST segment depression of 0.5 mm was noted during stress, beginning at 12 minutes of stress, and returning to baseline after less than 1 minute of recovery.   Negative, adequate GXT with the patient exercising to a HR of 153 (90%APMHR) and demonstrating excellent exercise tolerance (15.3 met workload). There were occasional PAC's. Nondiagnostic upsloping ST depression was present in late stage 4 and 5 with rapid resolution in the immediate recovery period.  _______________  Monitor 12/2016: 1. SB with HR 40s-60s _______________   Echocardiogram  06/09/2020: Impressions: 1. Left ventricular ejection fraction, by estimation, is 60 to 65%. The  left ventricle has normal function. The left ventricle has no regional  wall motion abnormalities. There is mild left ventricular hypertrophy.  Left ventricular diastolic parameters  were normal.   2. Right ventricular systolic function is normal. The right ventricular  size is normal. Tricuspid regurgitation signal is inadequate for assessing  PA pressure.   3. The mitral valve is normal in structure. Trivial mitral valve  regurgitation.   4. The aortic valve is tricuspid. Aortic valve regurgitation is not  visualized. No aortic stenosis is present.   5. The inferior vena cava is normal in size with greater than 50%  respiratory variability, suggesting right atrial pressure of 3 mmHg.   EKG:  EKG not ordered today.   Recent Labs: 05/18/2020: BNP 70.2 07/09/2020: ALT 16 08/17/2020: BUN 13; Creatinine, Ser 0.88; Potassium 3.8; Sodium 142  Recent Lipid Panel    Component Value Date/Time   CHOL 165 07/09/2020 0903   TRIG 87 07/09/2020 0903   HDL 59 07/09/2020 0903   CHOLHDL 2.8 07/09/2020 0903   LDLCALC 90 07/09/2020 0903    Physical Exam:    Vital Signs: BP (!) 80/60 (BP Location: Left Arm)   Pulse (!) 54   Ht 5\' 4"  (1.626 m)   Wt 206 lb (93.4 kg)   SpO2 95%   BMI 35.36 kg/m     Wt Readings from Last 3 Encounters:  11/18/20 206 lb (93.4 kg)  10/20/20 205 lb (93 kg)  07/19/20 198 lb (89.8 kg)     General: 55 y.o. African-American female in no acute distress. HEENT: Normocephalic and atraumatic. Sclera clear.  Neck: Supple. No carotid bruits. No JVD. Heart: RRR. Distinct S1 and S2. No murmurs, gallops, or rubs. Radial pulses 2+ and equal bilaterally. Lungs: No increased work of breathing. Clear to ausculation bilaterally. No wheezes, rhonchi, or rales.  Abdomen: Soft, non-distended, and non-tender to palpation.  Extremities: Mild (trace to 1+) pitting edema of bilateral  lower extremities.   Skin: Warm and dry. Neuro: Alert and oriented x3. No focal deficits. Psych: Normal affect. Responds appropriately.  Assessment:    1. Chronic heart failure with preserved ejection fraction ([place])   2. Sinus bradycardia   3. Primary hypertension   4. Hyperlipidemia, unspecified hyperlipidemia type   5. Dyspnea on exertion     Plan:    Dyspnea on Exertion - Patient has had significant dyspnea on exertion with minimal activity such as making her bed or cleaning around her house for the last several months.  - Recent Echo in 05/2020 showed normal LV function as below. BNP at previous visit normal. - Wonder if this could be an anginal equivalent. Will order coronary CTA for further evaluation. No beta-blocker needed due to baseline bradycardia. Will check BMET today.   Chronic CHF with Preserved EF - Echo in 05/2020 showed LVEF of 60-65% with normal wall motion and mild LVH and normal diastolic function. - Appears euvolemic on exam. - Continue Torsemide 40mg  twice daily. - Continue daily weights and sodium/fluid restrictions.    Sinus Bradycardia - Chronic and stable. - Not on any AV nodal agents.    Hypertension - History of hypertension but BP soft in the office today. Initially 80/60 but 102/68 on my recheck. She is mildly lightheaded in the office but otherwise asymptomatic. Patient states systolic BP in the 462V to 140s.  - She takes Amlodipine 5mg  twice daily and Hydralazine 20mg   twice daily as needed for systolic BP >248. Will hold evening dose of Amlodipine today. Advised patient to check her BP in the morning if systolic BP <250 would hold tomorrows dose as well.  - Patient to keep a BP log for 1 week and then send [place] a MyChart message.   Hyperlipidemia - Lipid panel from 06/2020: Total Cholesterol 165, Triglycerides 87, HDL 59, LDL 90.  - ASCVD 10 year <5% so no statin required at this time. Continue lifestyle modifications with exercise and heart healthy  diet.  Disposition: Follow up in 1 month after coronary CTA.   Medication Adjustments/Labs and Tests Ordered: Current medicines are reviewed at length with the patient today.  Concerns regarding medicines are outlined above.  Orders Placed This Encounter  Procedures   CT ANGIO CHEST AORTA W/CM & OR WO/CM   Basic metabolic panel    No orders of the defined types were placed in this encounter.   Patient Instructions  Medication Instructions:  No Changes *If you need a refill on your cardiac medications before your next appointment, please call your pharmacy*   Lab Work: BMET Today If you have labs (blood work) drawn today and your tests are completely normal, you will receive your results only by: [place] (if you have MyChart) OR A paper copy in the mail If you have any lab test that is abnormal or we need to change your treatment, we will call you to review the results.   Testing/Procedures: [facility], [address].  Your physician has requested that you have cardiac CT. Cardiac computed tomography (CT) is a painless test that uses an x-ray machine to take clear, detailed pictures of your heart. For further information please visit [email or web address].[email or web address]. Please follow instruction sheet as given.     Follow-Up: At [facility], you and your health needs are our priority.  As part of our continuing mission to provide you with exceptional heart care, we have created designated Provider Care Teams.  These Care Teams include your primary Cardiologist (physician) and Advanced Practice Providers (APPs -  Physician Assistants and Nurse Practitioners) who all work together to provide you with the care you need, when you need it.  We recommend signing up for the patient portal called "MyChart".  Sign up information is provided on this After Visit Summary.  MyChart is used to connect with patients for Virtual Visits (Telemedicine).  Patients are able to view  lab/test results, encounter notes, upcoming appointments, etc.  Non-urgent messages can be sent to your provider as well.   To learn more about what you can do with MyChart, go to [email or web address].    Your next appointment:   1 month(s)  The format for your next appointment:   In Person  Provider:   Quay Burow, MD   Other Instructions Hold Evening Dose of Amlodipine. If Blood Pressure below 105 Hold Evening Dose of Amlodipine.   Signed, Darreld Coccia, PA-C  11/18/2020 6:01 PM    [facility]

## 2020-11-09 ENCOUNTER — Other Ambulatory Visit: Payer: Self-pay | Admitting: *Deleted

## 2020-11-09 MED ORDER — NURTEC 75 MG PO TBDP: 75.0000 mg | ORAL_TABLET | Freq: Every day | ORAL | 11 refills | Status: DC | PRN

## 2020-11-09 NOTE — Telephone Encounter (Signed)
PA denied for #10/30days. Only covers #8/30days. Sent in new rx for #8/30 to pharmacy.

## 2020-11-15 ENCOUNTER — Other Ambulatory Visit: Payer: Self-pay

## 2020-11-15 DIAGNOSIS — G43009 Migraine without aura, not intractable, without status migrainosus: Secondary | ICD-10-CM

## 2020-11-15 MED ORDER — NARATRIPTAN HCL 2.5 MG PO TABS: 2.5000 mg | ORAL_TABLET | ORAL | 5 refills | Status: DC | PRN

## 2020-11-18 ENCOUNTER — Other Ambulatory Visit: Payer: Self-pay

## 2020-11-18 ENCOUNTER — Encounter: Payer: Self-pay | Admitting: Student

## 2020-11-18 ENCOUNTER — Ambulatory Visit (INDEPENDENT_AMBULATORY_CARE_PROVIDER_SITE_OTHER): Payer: 59 | Admitting: Student

## 2020-11-18 VITALS — BP 80/60 | HR 54 | Ht 64.0 in | Wt 206.0 lb

## 2020-11-18 DIAGNOSIS — I5032 Chronic diastolic (congestive) heart failure: Secondary | ICD-10-CM

## 2020-11-18 DIAGNOSIS — E785 Hyperlipidemia, unspecified: Secondary | ICD-10-CM | POA: Diagnosis not present

## 2020-11-18 DIAGNOSIS — R001 Bradycardia, unspecified: Secondary | ICD-10-CM | POA: Diagnosis not present

## 2020-11-18 DIAGNOSIS — R0609 Other forms of dyspnea: Secondary | ICD-10-CM

## 2020-11-18 DIAGNOSIS — R0600 Dyspnea, unspecified: Secondary | ICD-10-CM

## 2020-11-18 DIAGNOSIS — I10 Essential (primary) hypertension: Secondary | ICD-10-CM

## 2020-11-18 NOTE — Patient Instructions (Addendum)
Medication Instructions:  No Changes *If you need a refill on your cardiac medications before your next appointment, please call your pharmacy*   Lab Work: BMET Today If you have labs (blood work) drawn today and your tests are completely normal, you will receive your results only by: [place] (if you have MyChart) OR A paper copy in the mail If you have any lab test that is abnormal or we need to change your treatment, we will call you to review the results.   Testing/Procedures: [facility], [address]  Your physician has requested that you have cardiac CT. Cardiac computed tomography (CT) is a painless test that uses an x-ray machine to take clear, detailed pictures of your heart. For further information please visit HugeFiesta.tn. Please follow instruction sheet as given.     Follow-Up: At [facility], you and your health needs are our priority.  As part of our continuing mission to provide you with exceptional heart care, we have created designated Provider Care Teams.  These Care Teams include your primary Cardiologist (physician) and Advanced Practice Providers (APPs -  Physician Assistants and Nurse Practitioners) who all work together to provide you with the care you need, when you need it.  We recommend signing up for the patient portal called "MyChart".  Sign up information is provided on this After Visit Summary.  MyChart is used to connect with patients for Virtual Visits (Telemedicine).  Patients are able to view lab/test results, encounter notes, upcoming appointments, etc.  Non-urgent messages can be sent to your provider as well.   To learn more about what you can do with MyChart, go to [email or web address].    Your next appointment:   1 month(s)  The format for your next appointment:   In Person  Provider:   Quay Burow, MD   Other Instructions Hold Evening Dose of Amlodipine. If Blood Pressure below 105 Hold Evening  Dose of Amlodipine.

## 2020-11-19 LAB — BASIC METABOLIC PANEL
BUN/Creatinine Ratio: 15 (ref 9–23)
BUN: 17 mg/dL (ref 6–24)
CO2: 28 mmol/L (ref 20–29)
Calcium: 9.3 mg/dL (ref 8.7–10.2)
Chloride: 101 mmol/L (ref 96–106)
Creatinine, Ser: 1.11 mg/dL — ABNORMAL HIGH (ref 0.57–1.00)
Glucose: 90 mg/dL (ref 65–99)
Potassium: 5.2 mmol/L (ref 3.5–5.2)
Sodium: 144 mmol/L (ref 134–144)
eGFR: 58 mL/min/{1.73_m2} — ABNORMAL LOW (ref >59)

## 2020-11-26 DIAGNOSIS — R0609 Other forms of dyspnea: Secondary | ICD-10-CM

## 2020-11-26 DIAGNOSIS — R0600 Dyspnea, unspecified: Secondary | ICD-10-CM

## 2020-12-01 NOTE — Telephone Encounter (Signed)
Marie Peterson,   I saw the patient on 11/18/2020 and ordered a coronary CTA for further evaluation of dyspnea on exertion (possible anginal equivalent). It looks like a chest CTA looking at the aorta got ordered in stead. Can we please cancel this and order a coronary CTA?  Thank you so much! Illana Nolting

## 2020-12-01 NOTE — Addendum Note (Signed)
Addended by: Waylan Rocher on: 12/01/2020 03:00 PM   Modules accepted: Orders

## 2020-12-05 NOTE — Progress Notes (Signed)
Cardiology Office Note:    Date:  12/17/2020   ID:  Marie Peterson, DOB Nov 29, 1965, MRN [id number]  PCP:  Marie Gravel, MD  Cardiologist:  Quay Burow, MD  Electrophysiologist:  None   Referring MD: Marie Gravel, MD   Chief Complaint: follow-up on dyspnea on exertion  History of Present Illness:    Marie Peterson is a 55 y.o. female with a history of atypical chest pain with non-obstructive CAD noted on coronary CTA on 12/08/2020, chronic CHF with preserved EF, sinus bradycardia noted on monitor in 2018,  hypertension, hyperlipidemia, GERD, SLE with lupus nephritis, fibromyalgia, migraines, arthritis, and chronic pain who is followed by Dr. Gwenlyn Peterson and presents today for follow-up of dyspnea on exertion.   Patient has history of atypical chest pain. She underwent ETT in 10/2014 which was normal. She was noted to be bradycardic in fall of 2018. Monitor in 12/2016 showed sinus bradycardia with rates in the 40's to 60's. Echo showed LVEF of 60-65% with normal wall motion, normal diastolic function, trace MR, and trace TR.   I have seen patient multiple times since 04/2020 for dyspnea on exertion, lower extremity edema, and weight gain. BNP was normal. Echo in 05/2020 showed  LVEF of 60-65% with normal wall motion and mild LVH. Lasix was increased. She initially reported some mild chest heaviness on exertion preceded by shortness of breath but this resolved after increase in Lasix. Weight gain and edema initially improved with this but we ultimately switched to Torsemide. Patient was last seen by me on 11/08/2020 at which time she noted fluctuating weight and significant dyspnea on exertion with minimal activity such as making her bed and cleaning around her house. She denied any chest pain and lower extremity edema was stable. BP was also soft in the office. Coronary CTA was ordered for further evaluation of dyspnea on exertion and Amlodipine was ultimately decreased to '5mg'$  once daily. Coronary CTA on 12/08/2020  showed a coronary calcium score of 37.8 Agatston units (88th percentile for age and gender) with non-obstructive disease of LAD.  Patient presents today for follow-up. Here alone. Continues to have significant shortness of breath - now with any type of activity. She has occasional chest pressure when she gets very short of breath but otherwise no chest pain. She sleeps on multiple pillows but states that this is not new. No PND. Lower extremity well controlled on Torsemide. She also continues to have dizziness with position changes. Also reports some tinnitus. No palpitations. No syncope.   Patient brings in a great log of her BP/HR and weights today. BP higher in the morning (systolic BP mostly in the 120s to 130s) but lower in the evening with systolic BP as low as the 80s at times. Weight relatively stable ranging from 205-209lbs but has been gradually increasing over the last couple of months. Wonder if this is true weight gain rather than fluid weight given patient is not able to be as active.   Past Medical History:  Diagnosis Date   Anxiety    Arthritis    Atrophic vaginitis    Atypical chest pain    thought to be GERD; myoview 05/17/07-no significant ischemia; echo 07/20/11- normal EF=>55%   Baker's cyst 03/28/2012   venous doppler 03/28/12=no thrombus; baker's cyst at left popliteal fossa   Chest pain    CIN I (cervical intraepithelial neoplasia I) 1990   Degenerative disc disease, cervical 2020   severe, C3-4, 4-5, 5-6. Currently receiving injections by Dr. Vira Blanco  of preferred pain management   Depression    Fibroid    Fibromyalgia    Hypertension    Lupus nephritis ([place])    Migraines    Neuropathy    Osteopenia 09/2017   T score -2.0 improved from prior study   Premature menopause age 26   following chemotherapy for Lupus   Shingles     Past Surgical History:  Procedure Laterality Date   cervical fossa injections  2020   CESAREAN SECTION     COLPOSCOPY     HYSTEROSCOPY      Myomectomy   MYOMECTOMY     Hysteroscopic myomectomy   NASAL SINUS SURGERY  01/2016   thumb surg  07/07/2019   TUBAL LIGATION     WRIST SURGERY Right 06/21/2020    Current Medications: Current Meds  Medication Sig   AJOVY 225 MG/1.5ML SOSY INJECT '675MG'$  UNDER THE SKIN EVERY 3 MONTHS   alendronate (FOSAMAX) 70 MG tablet Take 1 tablet (70 mg total) by mouth once a week. Take with a full glass of water on an empty stomach.   aspirin 81 MG tablet Take 81 mg by mouth daily.   Atogepant (QULIPTA) 60 MG TABS Take 60 mg by mouth daily.   atorvastatin (LIPITOR) 40 MG tablet Take 1 tablet (40 mg total) by mouth daily.   benzonatate (TESSALON) 100 MG capsule Take 100 mg by mouth 3 (three) times daily.   calcium carbonate (OS-CAL - DOSED IN MG OF ELEMENTAL CALCIUM) 1250 (500 Ca) MG tablet Take 1 tablet by mouth. Per patient taking 1000 mg daily   Cholecalciferol (VITAMIN D PO) Take 2,000 Int'l Units by mouth daily.    clotrimazole-betamethasone (LOTRISONE) cream Apply 1 application topically 2 (two) times daily.   diclofenac sodium (VOLTAREN) 1 % GEL Apply topically.   FERREX 150 150 MG capsule Take 1 tablet by mouth daily.   folic acid (FOLVITE) A999333 MCG tablet Take 400 mcg by mouth daily.   hydrALAZINE (APRESOLINE) 10 MG tablet TAKE 2 TABLETS(20 MG) BY MOUTH TWICE DAILY AS NEEDED FOR SYSTOLIC BLOOD PRESSURE OVER 150   HYDROcodone-acetaminophen (NORCO/VICODIN) 5-325 MG tablet Take 1 tablet by mouth every 6 (six) hours as needed for severe pain.   hydroxychloroquine (PLAQUENIL) 200 MG tablet Take 200 mg by mouth 2 (two) times daily.    L-Methylfolate-B6-B12 (METANX PO) Take 1 tablet by mouth 2 (two) times daily.    lidocaine (LIDODERM) 5 % Place 3 patches onto the skin daily. Remove & Discard patch within 12 hours or as directed by MD   LORazepam (ATIVAN) 0.5 MG tablet Take 0.5 mg by mouth daily as needed.   magnesium oxide (MAGNESIUM-OXIDE) 400 (241.3 Mg) MG tablet Take 1 tablet (400 mg total) by  mouth daily.   Multiple Vitamin (MULTIVITAMIN) tablet Take 1 tablet by mouth daily.   naratriptan (AMERGE) 2.5 MG tablet Take 1 tablet (2.5 mg total) by mouth as needed for migraine. Take one (1) tablet at onset of headache; if returns or does not resolve, may repeat after 4 hours; do not exceed five (5) mg in 24 hours.   Omega-3 Fatty Acids (FISH OIL) 1000 MG CAPS Take 2 capsules by mouth daily.   omeprazole (PRILOSEC) 40 MG capsule Take 40 mg by mouth daily.   ondansetron (ZOFRAN-ODT) 4 MG disintegrating tablet DISSOLVE 1 TABLET ON THE TONGUE EVERY 8 HOURS AS NEEDED FOR NAUSEA OR VOMITING   oxyCODONE-acetaminophen (PERCOCET) 7.5-325 MG tablet Take 1 tablet by mouth every 8 (eight) hours.  potassium chloride SA (KLOR-CON) 20 MEQ tablet Take 1 tablet (20 mEq total) by mouth 2 (two) times daily.   predniSONE (DELTASONE) 5 MG tablet Take 5 mg by mouth every 3 (three) days. Take one tablet every 3 days   pregabalin (LYRICA) 200 MG capsule Take 200 mg by mouth daily.   PREVIDENT 5000 PLUS 1.1 % CREA dental cream SMARTSIG:Sparingly By Mouth Twice Daily   Rimegepant Sulfate (NURTEC) 75 MG TBDP Take 75 mg by mouth daily as needed (take for abortive therapy of migraine, no more than 1 tablet in 24 hours or 8 per month).   sertraline (ZOLOFT) 100 MG tablet sertraline 100 mg tablet  TAKE 1 TABLET BY MOUTH EVERY DAY   sertraline (ZOLOFT) 25 MG tablet Take by mouth with 100 mg tablet for total of 125 mg at bedtime.   Suvorexant (BELSOMRA) 20 MG TABS Take 20 mg by mouth Nightly. Take one tablet at bedtime daily   tiZANidine (ZANAFLEX) 4 MG tablet 1 tablet as needed   tolterodine (DETROL LA) 4 MG 24 hr capsule Take 4 mg by mouth daily.   XIIDRA 5 % SOLN Place 1 drop into both eyes 2 (two) times daily.   [DISCONTINUED] amLODipine (NORVASC) 5 MG tablet TAKE 1 TABLET BY MOUTH EVERY MORNING AND EVERY NIGHT AT BEDTIME     Allergies:   Ubrogepant, Benazepril, Cymbalta [duloxetine hcl], Hygroton [chlorthalidone],  Mirabegron, Other, and Duloxetine   Social History   Socioeconomic History   Marital status: Married    Spouse name: [name]   Number of children: 2   Years of education: College   Highest education level: Not on file  Occupational History    Employer: DISABLED    Comment: disability  Tobacco Use   Smoking status: Never   Smokeless tobacco: Never  Vaping Use   Vaping Use: Never used  Substance and Sexual Activity   Alcohol use: No    Alcohol/week: 0.0 standard drinks   Drug use: No   Sexual activity: Not Currently    Birth control/protection: Surgical, Post-menopausal    Comment: Tubal lig-1st intercourse 55 yo-Fewer than 5 partners  Other Topics Concern   Not on file  Social History Narrative   Patient lives at home with her daughter. She is currently separated (not legally).   Caffeine Use: 1 cup of coffee daily.    Right-handed   Social Determinants of Radio broadcast assistant Strain: Not on file  Food Insecurity: Not on file  Transportation Needs: Not on file  Physical Activity: Not on file  Stress: Not on file  Social Connections: Not on file     Family History: The patient's family history includes Asthma in her mother; COPD in her mother; Cancer in her father; Diabetes in her father; Heart disease in her father; Hypertension in her father and mother.  ROS:   Please see the history of present illness.     EKGs/Labs/Other Studies Reviewed:    The following studies were reviewed today:  Echocardiogram 06/09/2020: Impressions:1. Left ventricular ejection fraction, by estimation, is 60 to 65%. The  left ventricle has normal function. The left ventricle has no regional  wall motion abnormalities. There is mild left ventricular hypertrophy.  Left ventricular diastolic parameters  were normal.   2. Right ventricular systolic function is normal. The right ventricular  size is normal. Tricuspid regurgitation signal is inadequate for assessing  PA pressure.    3. The mitral valve is normal in structure. Trivial mitral valve  regurgitation.   4. The aortic valve is tricuspid. Aortic valve regurgitation is not  visualized. No aortic stenosis is present.   5. The inferior vena cava is normal in size with greater than 50%  respiratory variability, suggesting right atrial pressure of 3 mmHg. _______________  Coronary CTA 12/08/2020: Impressions: 1. Coronary artery calcium score 37.8 Agatston units. This places the patient in the 88th percentile for age and gender, suggesting high risk for future cardiac events. 2.  Nonobstructive LAD disease.  EKG:  EKG not ordered today.   Recent Labs: 05/18/2020: BNP 70.2 07/09/2020: ALT 16 11/18/2020: BUN 17; Creatinine, Ser 1.11; Potassium 5.2; Sodium 144  Recent Lipid Panel    Component Value Date/Time   CHOL 165 07/09/2020 0903   TRIG 87 07/09/2020 0903   HDL 59 07/09/2020 0903   CHOLHDL 2.8 07/09/2020 0903   LDLCALC 90 07/09/2020 0903    Physical Exam:    Vital Signs: BP 110/70   Pulse (!) 55   Ht '5\' 4"'$  (1.626 m)   Wt 207 lb 9.6 oz (94.2 kg)   SpO2 97%   BMI 35.63 kg/m     Wt Readings from Last 3 Encounters:  12/17/20 207 lb 9.6 oz (94.2 kg)  11/18/20 206 lb (93.4 kg)  10/20/20 205 lb (93 kg)     General: 55 y.o. African-American female in no acute distress. HEENT: Normocephalic and atraumatic. Sclera clear.  Neck: Supple. No carotid bruits. No JVD. Heart: RRR. Distinct S1 and S2. No murmurs, gallops, or rubs. Radial pulses 2+ and equal bilaterally. Lungs: No increased work of breathing. Clear to ausculation bilaterally. No wheezes, rhonchi, or rales.  Abdomen: Soft, non-distended, and non-tender to palpation.  Extremities: Mild lower extremity edema bilaterally.  Skin: Warm and dry. Neuro: Alert and oriented x3. No focal deficits. Psych: Normal affect. Responds appropriately.  Assessment:    1. Dyspnea on exertion   2. Chronic heart failure with preserved ejection fraction ([place])    3. Sinus bradycardia   4. Primary hypertension   5. Hyperlipidemia, unspecified hyperlipidemia type     Plan:    Dyspnea on Exertion - Patient continues to have significant dyspnea on exertion now with any activity. However, cardiac work-up has been unremarkable. Previous BNP normal. Echo in 05/2020 showed normal LV function and diastolic function. Recent coronary CTA earlier this months showed mild non-obstructive disease. - Patient may have a component of diastolic CHF (although diastolic function normal on Echo earlier this year) but I do not thinks her shortness of breath is cardiac at this time.  - Will check chest x-ray and CBC to ensure she is not anemic.  - Will refer to Pulmonology.   Chronic CHF with Preserved EF - Echo in 05/2020 showed LVEF of 60-65% with normal wall motion and mild LVH. Diastolic parameters were normal. - Appears euvolemic on exam.  - Continue Torsemide '40mg'$  twice daily.  - Continue daily weights and sodium/fluid restrictions.   Non-Obstructive CAD - Coronary CTA on 12/08/2020 showed coronary calcium score of 37.8 Agatston units (88th percentile for age and gender) with non-obstructive disease of LAD. - She notes occasional chest pain when she gets very short of breath. Otherwise, no chest pain.  - Continue Aspirin '81mg'$  daily. - Will start statin.   Sinus Bradycardia - Chronic and stable.  - Not on any AV nodal agents.   Hypertension - BP well controlled in the office but still having low BP at home with systolic BP in the [id number] to 90s  at times.  Eugenie Birks top Amlodipine.  - Can continue Hydralazine '20mg'$  twice daily as needed for systolic BP 99991111.   Hyperlipidemia - Lipid panel from 06/2020: Total Cholesterol 165, Triglycerides 87, HDL 59, LDL 90.  - LDL <70 given CAD. - Will start Lipitor '40mg'$  daily.  - Will repeat lipid panel/LFTs in 2-3 months.   Disposition: Follow up in 3-4 months with Dr. Gwenlyn Peterson.   Medication Adjustments/Labs and Tests  Ordered: Current medicines are reviewed at length with the patient today.  Concerns regarding medicines are outlined above.  Orders Placed This Encounter  Procedures   DG Chest 2 View   Hepatic function panel   Lipid panel   CBC   Ambulatory referral to Pulmonology   Meds ordered this encounter  Medications   atorvastatin (LIPITOR) 40 MG tablet    Sig: Take 1 tablet (40 mg total) by mouth daily.    Dispense:  90 tablet    Refill:  3    Patient Instructions  Medication Instructions:  START Lipitor (Atorvastatin) 40 mg daily STOP Amlodipine  *If you need a refill on your cardiac medications before your next appointment, please call your pharmacy*  Lab Work: Your physician recommends that you return for lab work TODAY:  CBC  Your physician recommends that you return for lab work in 3 months:  Fasting Lipid Panel-DO NOT EAT OR DRINK PAST MIDNIGHT. OKAY TO HAVE WATER. Hepatic (Liver) Function Test  If you have labs (blood work) drawn today and your tests are completely normal, you will receive your results only by: MyChart Message (if you have MyChart) OR A paper copy in the mail If you have any lab test that is abnormal or we need to change your treatment, we will call you to review the results.  Testing/Procedures: A chest x-ray takes a picture of the organs and structures inside the chest, including the heart, lungs, and blood vessels. This test can show several things, including, whether the heart is enlarges; whether fluid is building up in the lungs; and whether pacemaker / defibrillator leads are still in place. This test is performed at [place], [place] [address]  Follow-Up: At [facility], you and your health needs are our priority.  As part of our continuing mission to provide you with exceptional heart care, we have created designated Provider Care Teams.  These Care Teams include your primary Cardiologist (physician) and Advanced Practice  Providers (APPs -  Physician Assistants and Nurse Practitioners) who all work together to provide you with the care you need, when you need it.   Your next appointment:   3-4 month(s)  The format for your next appointment:   In Person  Provider:   Quay Burow, MD  Other Instructions You have been referred to Pulmonology   Signed, Darreld Stranahan, PA-C  12/17/2020 4:50 PM    [place]

## 2020-12-06 ENCOUNTER — Telehealth (HOSPITAL_COMMUNITY): Payer: Self-pay | Admitting: *Deleted

## 2020-12-06 NOTE — Telephone Encounter (Signed)
Attempted to call patient regarding upcoming cardiac CT appointment. °Left message on voicemail with name and callback number ° °Ayme Short RN Navigator Cardiac Imaging °[facility] °[phone] Office °[phone] Cell ° °

## 2020-12-06 NOTE — Telephone Encounter (Signed)
Patient returning call regarding upcoming cardiac imaging study; pt verbalizes understanding of appt date/time, parking situation and where to check in, pre-test NPO status, and verified current allergies; name and call back number provided for further questions should they arise  [facility] [phone] office [phone] cell  Patient to hold BP medications on day of test until after Cardiac CT scan.

## 2020-12-08 ENCOUNTER — Ambulatory Visit (HOSPITAL_COMMUNITY)
Admission: RE | Admit: 2020-12-08 | Discharge: 2020-12-08 | Disposition: A | Discharge status: Home / Self Care | Payer: 59 | Source: Ambulatory Visit | Attending: Student | Admitting: Student

## 2020-12-08 ENCOUNTER — Other Ambulatory Visit: Payer: Self-pay

## 2020-12-08 ENCOUNTER — Encounter (HOSPITAL_COMMUNITY): Payer: Self-pay

## 2020-12-08 DIAGNOSIS — I2510 Atherosclerotic heart disease of native coronary artery without angina pectoris: Secondary | ICD-10-CM | POA: Insufficient documentation

## 2020-12-08 DIAGNOSIS — I5032 Chronic diastolic (congestive) heart failure: Secondary | ICD-10-CM | POA: Diagnosis not present

## 2020-12-08 DIAGNOSIS — R0609 Other forms of dyspnea: Secondary | ICD-10-CM

## 2020-12-08 DIAGNOSIS — R0600 Dyspnea, unspecified: Secondary | ICD-10-CM | POA: Insufficient documentation

## 2020-12-08 MED ORDER — NITROGLYCERIN 0.4 MG SL SUBL
SUBLINGUAL_TABLET | SUBLINGUAL | Status: AC
  Filled 2020-12-08: qty 2

## 2020-12-08 MED ORDER — IOHEXOL 350 MG/ML SOLN
95.0000 mL | Freq: Once | INTRAVENOUS | Status: AC | PRN
  Administered 2020-12-08: 95 mL via INTRAVENOUS

## 2020-12-08 MED ORDER — NITROGLYCERIN 0.4 MG SL SUBL
0.8000 mg | SUBLINGUAL_TABLET | Freq: Once | SUBLINGUAL | Status: AC
  Administered 2020-12-08: 0.8 mg via SUBLINGUAL

## 2020-12-08 MED ORDER — METOPROLOL TARTRATE 5 MG/5ML IV SOLN
INTRAVENOUS | Status: AC
  Filled 2020-12-08: qty 5

## 2020-12-08 MED ORDER — METOPROLOL TARTRATE 5 MG/5ML IV SOLN
2.5000 mg | INTRAVENOUS | Status: DC | PRN
Start: 2020-12-08 — End: 2020-12-09
  Administered 2020-12-08: 2.5 mg via INTRAVENOUS

## 2020-12-17 ENCOUNTER — Ambulatory Visit (INDEPENDENT_AMBULATORY_CARE_PROVIDER_SITE_OTHER): Payer: 59 | Admitting: Student

## 2020-12-17 ENCOUNTER — Other Ambulatory Visit: Payer: Self-pay

## 2020-12-17 ENCOUNTER — Encounter: Payer: Self-pay | Admitting: Student

## 2020-12-17 VITALS — BP 110/70 | HR 55 | Ht 64.0 in | Wt 207.6 lb

## 2020-12-17 DIAGNOSIS — I2510 Atherosclerotic heart disease of native coronary artery without angina pectoris: Secondary | ICD-10-CM | POA: Diagnosis not present

## 2020-12-17 DIAGNOSIS — S90122A Contusion of left lesser toe(s) without damage to nail, initial encounter: Secondary | ICD-10-CM | POA: Diagnosis not present

## 2020-12-17 DIAGNOSIS — R001 Bradycardia, unspecified: Secondary | ICD-10-CM

## 2020-12-17 DIAGNOSIS — R0600 Dyspnea, unspecified: Secondary | ICD-10-CM | POA: Diagnosis not present

## 2020-12-17 DIAGNOSIS — I5032 Chronic diastolic (congestive) heart failure: Secondary | ICD-10-CM

## 2020-12-17 DIAGNOSIS — I10 Essential (primary) hypertension: Secondary | ICD-10-CM

## 2020-12-17 DIAGNOSIS — E785 Hyperlipidemia, unspecified: Secondary | ICD-10-CM

## 2020-12-17 DIAGNOSIS — R0609 Other forms of dyspnea: Secondary | ICD-10-CM

## 2020-12-17 DIAGNOSIS — S92919A Unspecified fracture of unspecified toe(s), initial encounter for closed fracture: Secondary | ICD-10-CM | POA: Diagnosis not present

## 2020-12-17 LAB — CBC
Hematocrit: 40.2 % (ref 34.0–46.6)
Hemoglobin: 13.5 g/dL (ref 11.1–15.9)
MCH: 29 pg (ref 26.6–33.0)
MCHC: 33.6 g/dL (ref 31.5–35.7)
MCV: 86 fL (ref 79–97)
Platelets: 242 10*3/uL (ref 150–450)
RBC: 4.66 x10E6/uL (ref 3.77–5.28)
RDW: 13.6 % (ref 11.7–15.4)
WBC: 5.4 10*3/uL (ref 3.4–10.8)

## 2020-12-17 MED ORDER — ATORVASTATIN CALCIUM 40 MG PO TABS: 40.0000 mg | ORAL_TABLET | Freq: Every day | ORAL | 3 refills | Status: DC

## 2020-12-17 NOTE — Patient Instructions (Addendum)
Medication Instructions:  START Lipitor (Atorvastatin) 40 mg daily STOP Amlodipine  *If you need a refill on your cardiac medications before your next appointment, please call your pharmacy*  Lab Work: Your physician recommends that you return for lab work TODAY:  CBC  Your physician recommends that you return for lab work in 3 months:  Fasting Lipid Panel-DO NOT EAT OR DRINK PAST MIDNIGHT. OKAY TO HAVE WATER. Hepatic (Liver) Function Test  If you have labs (blood work) drawn today and your tests are completely normal, you will receive your results only by: MyChart Message (if you have MyChart) OR A paper copy in the mail If you have any lab test that is abnormal or we need to change your treatment, we will call you to review the results.  Testing/Procedures: A chest x-ray takes a picture of the organs and structures inside the chest, including the heart, lungs, and blood vessels. This test can show several things, including, whether the heart is enlarges; whether fluid is building up in the lungs; and whether pacemaker / defibrillator leads are still in place. This test is performed at [place], [place] [address]  Follow-Up: At [facility], you and your health needs are our priority.  As part of our continuing mission to provide you with exceptional heart care, we have created designated Provider Care Teams.  These Care Teams include your primary Cardiologist (physician) and Advanced Practice Providers (APPs -  Physician Assistants and Nurse Practitioners) who all work together to provide you with the care you need, when you need it.   Your next appointment:   3-4 month(s)  The format for your next appointment:   In Person  Provider:   Quay Burow, MD  Other Instructions You have been referred to Pulmonology

## 2020-12-21 ENCOUNTER — Ambulatory Visit
Admission: RE | Admit: 2020-12-21 | Discharge: 2020-12-21 | Disposition: A | Discharge status: Home / Self Care | Payer: 59 | Source: Ambulatory Visit | Attending: Student | Admitting: Student

## 2020-12-21 DIAGNOSIS — R0600 Dyspnea, unspecified: Secondary | ICD-10-CM

## 2020-12-21 DIAGNOSIS — R0609 Other forms of dyspnea: Secondary | ICD-10-CM

## 2020-12-23 ENCOUNTER — Encounter: Payer: Self-pay | Admitting: Neurology

## 2020-12-23 ENCOUNTER — Ambulatory Visit: Payer: 59 | Admitting: Podiatry

## 2020-12-23 ENCOUNTER — Other Ambulatory Visit: Payer: Self-pay | Admitting: Neurology

## 2020-12-23 MED ORDER — QULIPTA 60 MG PO TABS: 60.0000 mg | ORAL_TABLET | Freq: Every day | ORAL | 3 refills | Status: DC

## 2020-12-29 NOTE — Telephone Encounter (Signed)
Let's try increasing her Amlodipine to 2.5mg  twice daily to see if that helps her BP without causing hypotension. I still don't think her shortness of breath is cardiac in nature at this point (we have done a thorough work-up). We have referred her to Pulmonology so we will see what they have think. Would recommend she continue wearing compression stockings when she is on her feet a lot to help with the swelling.  Thank you!

## 2020-12-30 NOTE — Telephone Encounter (Signed)
Pt informed of providers result & recommendations. No further questions . Pt will take BP at least 1 hour after taking medication and call/email if BP continues to be out of range. Pt verbalized understanding.

## 2021-01-12 ENCOUNTER — Other Ambulatory Visit: Payer: Self-pay

## 2021-01-12 ENCOUNTER — Encounter: Payer: Self-pay | Admitting: Internal Medicine

## 2021-01-12 ENCOUNTER — Ambulatory Visit (INDEPENDENT_AMBULATORY_CARE_PROVIDER_SITE_OTHER): Payer: 59 | Admitting: Internal Medicine

## 2021-01-12 VITALS — BP 128/86 | HR 55 | Temp 97.9°F | Ht 64.0 in | Wt 211.0 lb

## 2021-01-12 DIAGNOSIS — R0602 Shortness of breath: Secondary | ICD-10-CM

## 2021-01-12 DIAGNOSIS — Z23 Encounter for immunization: Secondary | ICD-10-CM | POA: Diagnosis not present

## 2021-01-12 DIAGNOSIS — R053 Chronic cough: Secondary | ICD-10-CM | POA: Diagnosis not present

## 2021-01-12 MED ORDER — OMEPRAZOLE 40 MG PO CPDR: 40.0000 mg | DELAYED_RELEASE_CAPSULE | Freq: Two times a day (BID) | ORAL | 5 refills | Status: AC

## 2021-01-12 NOTE — Progress Notes (Addendum)
Marie Peterson    [id number]    1966-01-27  Primary Care Physician:Kim, Jeneen Rinks, MD  Referring Physician: Darreld Wack, PA-C [address] [place],  [place] [address] Reason for Consultation: shortness of breath Date of Consultation: 01/12/2021  Chief complaint:   Chief Complaint  Patient presents with   Consult    DOE     HPI: Marie Peterson is a 55 y.o. woman who presents for new patient evaluation of dyspnea. She has a history of HFpEF and is on diuretics and  BP meds.   She notes progressive dyspnea on exertion over the last several months. She coughs with occasional mucus production. She has a cough first thing in the morning which also wakes her up at night. No chest tightness or wheezing.  She has reflux and is on prilosec, but that doesn't control it. Still with breakthrough reflux. No alcohol or carbonated  beverages. Coffee 1 cup/day.   She has never had pneumonia or bronchitis before. No childhood respiratory disease. Never been prescribed any inhalers.  Doing daily activities like cleaning the house, doing the dishes.   She also has lupus nephritis seeing Dr. Joelyn Oms.  She has had a lot of changes in life recently, daughters moved out, father died, recently separated.   She has also gained weight since her father got sick and passed away. She was grieving and is now on treatment for depression. Has gained 50 lbs in the last year.   Has trouble falling asleep and staying asleep. Daughter noted jerking movements and possible witnessed apneas.   Epworth Sleepiness Scale  Chance of dozing off while sitting and reading? 0  1  2  3   4   2.   Chance of dozing off while watching TV?  0  1  2  3   4   3.   Chance of dozing off while Sitting, inactive in a public place?  0  1  2  3   4   4.   Chance of dozing off as a passenger in a car for an hour without a break?  0  1  2  3   4   5.   Chance of dozing off while lying down to rest in the afternoon when  circumstances permit?  0  1  2  3   4   6.   Chance of dozing off while sitting and talking to someone?  0  1  2  3   4   7.   Chance of dozing off while sitting quietly after lunch without alcohol?  0  1  2  3   4   8.   Chance of dozing off while in a car, while stopped for a few minutes in traffic? 0  1  2  3   4     Total ESS 6/24   Social history:  Occupation: Disabled for SLE Exposures: lives at home alone, no pets Smoking history: never smoker passive smoke exposure in childhood.   Social History   Occupational History    Employer: DISABLED    Comment: disability  Tobacco Use   Smoking status: Never   Smokeless tobacco: Never  Vaping Use   Vaping Use: Never used  Substance and Sexual Activity   Alcohol use: No    Alcohol/week: 0.0 standard drinks   Drug use: No   Sexual activity: Not Currently    Birth control/protection: Surgical, Post-menopausal  Comment: Tubal lig-1st intercourse 55 yo-Fewer than 5 partners    Relevant family history:  Family History  Problem Relation Age of Onset   Hypertension Mother    Asthma Mother    COPD Mother    Diabetes Father    Hypertension Father    Heart disease Father    Cancer Father        prostate    Past Medical History:  Diagnosis Date   Anxiety    Arthritis    Atrophic vaginitis    Atypical chest pain    thought to be GERD; myoview 05/17/07-no significant ischemia; echo 07/20/11- normal EF=>55%   Baker's cyst 03/28/2012   venous doppler 03/28/12=no thrombus; baker's cyst at left popliteal fossa   Chest pain    CIN I (cervical intraepithelial neoplasia I) 1990   Degenerative disc disease, cervical 2020   severe, C3-4, 4-5, 5-6. Currently receiving injections by Dr. Vira Blanco of preferred pain management   Depression    Fibroid    Fibromyalgia    Hypertension    Lupus nephritis ([place])    Migraines    Neuropathy    Osteopenia 09/2017   T score -2.0 improved from prior study   Premature menopause age 72    following chemotherapy for Lupus   Shingles     Past Surgical History:  Procedure Laterality Date   cervical fossa injections  2020   CESAREAN SECTION     COLPOSCOPY     HYSTEROSCOPY     Myomectomy   MYOMECTOMY     Hysteroscopic myomectomy   NASAL SINUS SURGERY  01/2016   thumb surg  07/07/2019   TUBAL LIGATION     WRIST SURGERY Right 06/21/2020     Physical Exam: Blood pressure 128/86, pulse (!) 55, temperature 97.9 F (36.6 C), temperature source Oral, height 5\' 4"  (1.626 m), weight 211 lb (95.7 kg), SpO2 95 %. Gen:      No acute distress ENT:  mallampati IV, no nasal polyps, mucus membranes moist Lungs:    No increased respiratory effort, symmetric chest wall excursion, clear to auscultation bilaterally, no wheezes or crackles CV:         Regular rate and rhythm; no murmurs, rubs, or gallops.  No pedal edema Abd:      + bowel sounds; soft, non-tender; no distension MSK: no acute synovitis of DIP or PIP joints, no mechanics hands.  Skin:      Warm and dry; no rashes Neuro: normal speech, no focal facial asymmetry Psych: alert and oriented x3, normal mood and affect   Data Reviewed/Medical Decision Making:  Independent interpretation of tests: Imaging:  Review of patient's chest xray images 11/2020 revealed no acute process. The patient's images have been independently reviewed by me.    PFTs: None on file No flowsheet data found.  Labs:  Lab Results  Component Value Date   WBC 5.4 12/17/2020   HGB 13.5 12/17/2020   HCT 40.2 12/17/2020   MCV 86 12/17/2020   PLT 242 12/17/2020     PSG done July 2021 - negative for OSA  Immunization status:  Immunization History  Administered Date(s) Administered   DTP 08/27/2018   Influenza Split 01/30/2012, 12/12/2012, 12/10/2013, 02/16/2015, 12/30/2015   Influenza, Quadrivalent, Recombinant, Inj, Pf 12/14/2016, 02/28/2018, 01/13/2019, 12/11/2019   Influenza,inj,Quad PF,6+ Mos 01/12/2021   Influenza-Unspecified  12/25/2013, 02/28/2018   PFIZER(Purple Top)SARS-COV-2 Vaccination 06/26/2019, 07/21/2019, 02/27/2020   Tdap 09/02/2010, 09/30/2020     I reviewed prior external note(s) from cardiology  I reviewed the result(s) of the labs and imaging as noted above.   I have ordered PFT  Assessment:  Shortness of breath Chronic cough/GERD   Plan/Recommendations: Likely multifactorial dyspnea from hypertensive heart disease, obesity and deconditioning. I doubt intrinsic lung disease and no history of symptoms consistent with asthma.   She denies excessive daytime sleepiness based on today's ESS, otherwise can revisit repeat sleep study in future due to significant weight gain.  Will plan for PFTs to rule out lung disease. I will see her after this.  Recommend institututing regular exercise routine.  Increase PPI to BID for worsening cough related to GERD. Discussed lifestyle modifications for this.   Flu shot today.   We discussed disease management and progression at length today.    Return to Care: Return in about 2 months (around 03/14/2021).  [name], MD [facility] [place]  CC: Darreld Hagins, [place]

## 2021-01-12 NOTE — Patient Instructions (Addendum)
Please schedule follow up scheduled with myself in 2 months.  If my schedule is not open yet, we will contact you with a reminder closer to that time.  Before your next visit I would like you to have: Full set of PFTs - 1 our  Start exercising 20 minutes, 3 times a week.   Increase your omeprazole to 2 pills a day.   What is GERD? Gastroesophageal reflux disease (GERD) is gastroesophageal reflux diseasewhich occurs when the lower esophageal sphincter (LES) opens spontaneously, for varying periods of time, or does not close properly and stomach contents rise up into the esophagus. GER is also called acid reflux or acid regurgitation, because digestive juices--called acids--rise up with the food. The esophagus is the tube that carries food from the mouth to the stomach. The LES is a ring of muscle at the bottom of the esophagus that acts like a valve between the esophagus and stomach.  When acid reflux occurs, food or fluid can be tasted in the back of the mouth. When refluxed stomach acid touches the lining of the esophagus it may cause a burning sensation in the chest or throat called heartburn or acid indigestion. Occasional reflux is common. Persistent reflux that occurs more than twice a week is considered GERD, and it can eventually lead to more serious health problems. People of all ages can have GERD. Studies have shown that GERD may worsen or contribute to asthma, chronic cough, and pulmonary fibrosis.   What are the symptoms of GERD? The main symptom of GERD in adults is frequent heartburn, also called acid indigestion--burning-type pain in the lower part of the mid-chest, behind the breast bone, and in the mid-abdomen.  Not all reflux is acidic in nature, and many patients don't have heart burn at all. Sometimes it feels like a cough (either dry or with mucus), choking sensation, asthma, shortness of breath, waking up at night, frequent throat clearing, or trouble swallowing.    What  causes GERD? The reason some people develop GERD is still unclear. However, research shows that in people with GERD, the LES relaxes while the rest of the esophagus is working. Anatomical abnormalities such as a hiatal hernia may also contribute to GERD. A hiatal hernia occurs when the upper part of the stomach and the LES move above the diaphragm, the muscle wall that separates the stomach from the chest. Normally, the diaphragm helps the LES keep acid from rising up into the esophagus. When a hiatal hernia is present, acid reflux can occur more easily. A hiatal hernia can occur in people of any age and is most often a normal finding in otherwise healthy people over age 4. Most of the time, a hiatal hernia produces no symptoms.   Other factors that may contribute to GERD include - Obesity or recent weight gain - Pregnancy  - Smoking  - Diet - Certain medications  Common foods that can worsen reflux symptoms include: - carbonated beverages - artificial sweeteners - citrus fruits  - chocolate  - drinks with caffeine or alcohol  - fatty and fried foods  - garlic and onions  - mint flavorings  - spicy foods  - tomato-based foods, like spaghetti sauce, salsa, chili, and pizza   Lifestyle Changes If you smoke, stop.  Avoid foods and beverages that worsen symptoms (see above.) Lose weight if needed.  Eat small, frequent meals.  Wear loose-fitting clothes.  Avoid lying down for 3 hours after a meal.  Raise the head of  your bed 6 to 8 inches by securing wood blocks under the bedposts. Just using extra pillows will not help, but using a wedge-shaped pillow may be helpful.  Medications  H2 blockers, such as cimetidine (Tagamet HB), famotidine (Pepcid AC), nizatidine (Axid AR), and ranitidine (Zantac 75), decrease acid production. They are available in prescription strength and over-the-counter strength. These drugs provide short-term relief and are effective for about half of those who have  GERD symptoms.  Proton pump inhibitors include omeprazole (Prilosec, Zegerid), lansoprazole (Prevacid), pantoprazole (Protonix), rabeprazole (Aciphex), and esomeprazole (Nexium), which are available by prescription. Prilosec is also available in over-the-counter strength. Proton pump inhibitors are more effective than H2 blockers and can relieve symptoms and heal the esophageal lining in almost everyone who has GERD.  Because drugs work in different ways, combinations of medications may help control symptoms. People who get heartburn after eating may take both antacids and H2 blockers. The antacids work first to neutralize the acid in the stomach, and then the H2 blockers act on acid production. By the time the antacid stops working, the H2 blocker will have stopped acid production. Your health care provider is the best source of information about how to use medications for GERD.   Points to Remember 1. You can have GERD without having heartburn. Your symptoms could include a dry cough, asthma symptoms, or trouble swallowing.  2. Taking medications daily as prescribed is important in controlling you symptoms.  Sometimes it can take up to 8 weeks to fully achieve the effects of the medications prescribed.  3. Coughing related to GERD can be difficult to treat and is very frustrating!  However, it is important to stick with these medications and lifestyle modifications before pursuing more aggressive or invasive test and treatments.

## 2021-01-17 NOTE — Patient Instructions (Signed)
Below is our plan:  We will continue Qulipta 60mg  daily. Continue Nurtec or naratriptan for migraine abortion. You can combine Nurtec, naratriptan, benadryl 25mg , Tylenol 1000mg , and EITHER ibuprofen 800mg  OR Aleve 220mg  for really bad headaches not responsive to traditional abortive therapy.   Please make sure you are staying well hydrated. I recommend 50-60 ounces daily. Well balanced diet and regular exercise encouraged. Consistent sleep schedule with 6-8 hours recommended.   Please continue follow up with care team as directed.   Follow up with me in 1 year   You may receive a survey regarding today's visit. I encourage you to leave honest feed back as I do use this information to improve patient care. Thank you for seeing me today!

## 2021-01-17 NOTE — Progress Notes (Signed)
Chief Complaint  Patient presents with   Follow-up    Rm 2, alone. Here for 6 month migraine f/u. Pt reports doing well. Migraines are about the same since last OV.      HISTORY OF PRESENT ILLNESS: 01/19/21 ALL:  Marie Peterson returns for follow up for migraines. We started her on Qulipta due to breakthrough migraines on Ajovy. She does feel that Nurtec and naratriptan work well. She may have 8-12 headache days a month but only 2-3 are severe. She takes either Nurtec or naratriptan and usually migraine is easily aborted. She continues to see PCP and psychiatry regularly.   07/19/2020 ALL:  She returns for follow up for migraines. She continues Ajovy injections. She feels that these have helped significantly. She does feel that Ajovy wears off towards the end of the cycle (getting 3inj/67mths). She has about 5-6 migraines a month. Amerge works well for abortive therapy. Gabapentin was switched Lyrica. She is now taking 200mg  BID. Insurance no longer covers Ajovy. She is requesting to switch to oral CGRP due to fears of injections.   01/15/2020 ALL:  Marie Peterson is a 55 y.o. female here today for follow up for migraines. Sleep eval was normal. She continues  Ajovy and gabapentin 600/600/1200. She was started on Effexor by psychiatry but had an allergic reaction. She is now taking sertraline. She continues Nurtec for abortive therapy and feels that it helps with pain but does not abort migraine. She continues to have daily tension headaches. She has about 8 migraines per month. She admits stress is contributor. She is followed closely by Marie Peterson. She has chronic pain on oxycodone TID.   HISTORY (copied from Marie Peterson note on 09/11/2019)  Marie Peterson is a 55  year-old [place] or [place] American female patient established with Marie. Jaynee Peterson and seen here upon a referral on 09/11/2019 from Marie Peterson for sleep disordered breathing. Chief concern according to patient :   Marie Peterson reports that her  daughter has slept for a while at her house and noticed her to twitch and move during her sleep may be more of a trembling.  She also noted irregular breathing and pauses in her sleep breathing.  For this reason she is referred for evaluation of possible sleep apnea.  Marie. Jani Peterson referred.   I reviewed the patient's medication she is on Percocet, she is still on oxycodone.   Prednisone, amlodipine Benzapril, alendronate, omeprazole, Tessalon Perles as needed furosemide 40 mg actually 1.5 tablets in the morning and 1 in the night, hydralazine twice a day, no tach, Belsomra 20 mg Trintellix 10 mg not also on gabapentin 3 times daily. A lot of medications with sleep inducing or sleep changing potential.   Marie Peterson is a right -handed Black or [place] American female with a possible sleep disorder.  She has a past medical history of Lupus, Arthritis, Atrophic vaginitis, Atypical chest pain, Baker's cyst (03/28/12), Chest pain, CIN I (cervical intraepithelial neoplasia I) (1990), Degenerative disc disease, cervical (2020), Fibroid, "Fibromyalgia", Hypertension, Lupus nephritis ([place]), Migraines, Neuropathy, Osteopenia (09/2017), Premature menopause (age 42), and Shingles.    Sleep relevant medical history: Nocturia- diuretic induced 3-4 times , No Sleep walking, sinus surgery    Family medical /sleep history: niece is another family member on CPAP with OSA.   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 Family status is separated. Tobacco use- none .  ETOH use ;none , Caffeine intake in form of Coffee( 1 cup daily  Soda( none) Tea ( rare ) nor energy drinks. Regular exercise - none .   Sleep habits are as follows: The patient's dinner time is between 6 PM. The patient goes to bed at variable times , 10-11 PM and struggles to sleep-once asleep continues to sleep for 1-3 hours, wakes for many bathroom breaks.   The preferred sleep  position is supine of on her side, with the support of 2 pillows. She sleeps with a boot, plantar fasciitis.   Dreams are reportedly frequent.   8.30 AM is the usual rise time. The patient wakes up spontaneously between 4.30 and 5 AM , but tries to get more sleep.   She reports not feeling refreshed or restored in AM, with symptoms such as dry mouth , morning headaches , and residual fatigue. Naps are taken frequently, lasting from 1-2 hours , feels these are less refreshing than nocturnal sleep. She reportedly yawns al through the day.    "Addendum Marie Peterson/ Marie Marie Peterson have followed for Migraine and here is the last note from 03-11-2019: Marie Peterson is a 55 y.o. female here today for follow up. She is under more stress recently with her dad's new diagnosis of prostate and brain cancer. She is one of 12 children and feels that they all look to her for support. She has noticed headaches are worse. Same headache but more frequent. She continues to do well with Ajovy every three months, gabapentin 600mg  three times daily and Nurtec for abortive therapy. BP has been elevated. She is currently separated from her husband. She is followed by Marie Peterson. Effexor was recently switched to Trintellix. She feels that Trintellix is making her sick on her stomach. She is followed closely by  PCP as well. She is taking hydralazine, amlodipine and benazepril for HTN. She is on chronic opioids for pain management. "  History (copied from my note on 03/11/2019)  Marie Peterson is a 55 y.o. female here today for follow up. She is under more stress recently with her dad's new diagnosis of prostate and brain cancer. She is one of 12 children and feels that they all look to her for support. She has noticed headaches are worse. Same headache but more frequent. She continues to do well with Ajovy every three months, gabapentin 600mg  three times daily and Nurtec for abortive therapy. BP has been elevated. She is currently separated  from her husband. She is followed by Marie Peterson. Effexor was recently switched to Trintellix. She feels that Trintellix is making her sick on her stomach. She is followed closely by  PCP as well. She is taking hydralazine, amlodipine and benazepril for HTN. She is on chronic opioids for pain management.    HISTORY: (copied from my note on 10/31/2018)   Marie Peterson is a 55 y.o. female here today for follow up. She has had an increase in migraines. She is having daily headaches. She reports at least 2-3 migrainous headaches each week with light/sound sensitivity and nausea. Heat and stress make headaches worse. She is going through a separation currently and has noticed migraines are worse over the past month.  She is using Ajovy injections (3injections every three months). She is taking gabapentin 600mg  TID for neuropathy. She is taking Percocet 7.5/325mg  for severe DDD prescribed by PCP. She is also seeing rheumatology for Lupus.  She uses Zomig for abortive therapy that does ease up  the headache. She occasionally takes Zofran for nausea with migraine.    She is taking Belsomra for sleep. She does not feel it is helping her. She continues to Peterson frequently throughout the night. She does not snore. She is admittedly more depressed. She has not spoken with PCP due to him being out of the office for medical leave. She is not taking any antidepressants at this time. She does not remember being on anything in the past with the exception of Cymbalta for pain. This medications did cause mild lip swelling. No respiratory involvement , no swollen tongue or airway. No trouble breathing.    REVIEW OF SYSTEMS: Out of a complete 14 system review of symptoms, the patient complains only of the following symptoms, headaches, chronic pain, anxiety and depression and all other reviewed systems are negative.   ALLERGIES: Allergies  Allergen Reactions   Ubrogepant Swelling    Lip swelling    Benazepril     Lip  swelling   Cymbalta [Duloxetine Hcl]     Lip swelling   Hygroton [Chlorthalidone] Swelling    Swelling in lips   Mirabegron Itching    Other reaction(s): rash   Other     Other reaction(s): Unknown   Duloxetine Rash    Lip swelling     HOME MEDICATIONS: Outpatient Medications Prior to Visit  Medication Sig Dispense Refill   alendronate (FOSAMAX) 70 MG tablet Take 1 tablet (70 mg total) by mouth once a week. Take with a full glass of water on an empty stomach. 12 tablet 4   aspirin 81 MG tablet Take 81 mg by mouth daily.     atorvastatin (LIPITOR) 40 MG tablet Take 1 tablet (40 mg total) by mouth daily. 90 tablet 3   Cholecalciferol (VITAMIN D PO) Take 2,000 Int'l Units by mouth daily.      clotrimazole-betamethasone (LOTRISONE) cream Apply 1 application topically 2 (two) times daily. 30 g 0   diclofenac sodium (VOLTAREN) 1 % GEL Apply topically.     FERREX 150 150 MG capsule Take 1 tablet by mouth daily.  1   folic acid (FOLVITE) 409 MCG tablet Take 400 mcg by mouth daily.     hydrALAZINE (APRESOLINE) 10 MG tablet TAKE 2 TABLETS(20 MG) BY MOUTH TWICE DAILY AS NEEDED FOR SYSTOLIC BLOOD PRESSURE OVER 150 180 tablet 1   HYDROcodone-acetaminophen (NORCO/VICODIN) 5-325 MG tablet Take 1 tablet by mouth every 6 (six) hours as needed for severe pain. 10 tablet 0   hydroxychloroquine (PLAQUENIL) 200 MG tablet Take 200 mg by mouth 2 (two) times daily.   0   L-Methylfolate-B6-B12 (METANX PO) Take 1 tablet by mouth 2 (two) times daily.      lidocaine (LIDODERM) 5 % Place 3 patches onto the skin daily. Remove & Discard patch within 12 hours or as directed by MD 90 patch 6   LORazepam (ATIVAN) 0.5 MG tablet Take 0.5 mg by mouth daily as needed.     magnesium oxide (MAGNESIUM-OXIDE) 400 (241.3 Mg) MG tablet Take 1 tablet (400 mg total) by mouth daily. 90 tablet 3   Multiple Vitamin (MULTIVITAMIN) tablet Take 1 tablet by mouth daily.     Omega-3 Fatty Acids (FISH OIL) 1000 MG CAPS Take 2 capsules by  mouth daily.     omeprazole (PRILOSEC) 40 MG capsule Take 1 capsule (40 mg total) by mouth in the morning and at bedtime. 60 capsule 5   ondansetron (ZOFRAN-ODT) 4 MG disintegrating tablet DISSOLVE 1 TABLET ON THE TONGUE  EVERY 8 HOURS AS NEEDED FOR NAUSEA OR VOMITING 20 tablet 11   oxyCODONE-acetaminophen (PERCOCET) 7.5-325 MG tablet Take 1 tablet by mouth every 8 (eight) hours.     potassium chloride SA (KLOR-CON) 20 MEQ tablet Take 1 tablet (20 mEq total) by mouth 2 (two) times daily. 180 tablet 3   predniSONE (DELTASONE) 5 MG tablet Take 5 mg by mouth every 3 (three) days. Take one tablet every 3 days     pregabalin (LYRICA) 200 MG capsule Take 200 mg by mouth daily.     PREVIDENT 5000 PLUS 1.1 % CREA dental cream SMARTSIG:Sparingly By Mouth Twice Daily     sertraline (ZOLOFT) 100 MG tablet sertraline 100 mg tablet  TAKE 1 TABLET BY MOUTH EVERY DAY     sertraline (ZOLOFT) 25 MG tablet Take by mouth with 100 mg tablet for total of 125 mg at bedtime.     Suvorexant (BELSOMRA) 20 MG TABS Take 20 mg by mouth Nightly. Take one tablet at bedtime daily     tiZANidine (ZANAFLEX) 4 MG tablet 1 tablet as needed     tolterodine (DETROL LA) 4 MG 24 hr capsule Take 4 mg by mouth daily.     XIIDRA 5 % SOLN Place 1 drop into both eyes 2 (two) times daily.     naratriptan (AMERGE) 2.5 MG tablet Take 1 tablet (2.5 mg total) by mouth as needed for migraine. Take one (1) tablet at onset of headache; if returns or does not resolve, may repeat after 4 hours; do not exceed five (5) mg in 24 hours. 10 tablet 5   torsemide (DEMADEX) 20 MG tablet Take 2 tablets (40 mg total) by mouth 2 (two) times daily. Change in quantity to see if ins will cover. Thank you. 360 tablet 3   Atogepant (QULIPTA) 60 MG TABS Take 60 mg by mouth daily. 90 tablet 3   Rimegepant Sulfate (NURTEC) 75 MG TBDP Take 75 mg by mouth daily as needed (take for abortive therapy of migraine, no more than 1 tablet in 24 hours or 8 per month). 8 tablet 11    No facility-administered medications prior to visit.     PAST MEDICAL HISTORY: Past Medical History:  Diagnosis Date   Anxiety    Arthritis    Atrophic vaginitis    Atypical chest pain    thought to be GERD; myoview 05/17/07-no significant ischemia; echo 07/20/11- normal EF=>55%   Baker's cyst 03/28/2012   venous doppler 03/28/12=no thrombus; baker's cyst at left popliteal fossa   Chest pain    CIN I (cervical intraepithelial neoplasia I) 1990   Degenerative disc disease, cervical 2020   severe, C3-4, 4-5, 5-6. Currently receiving injections by Marie. Vira Blanco of preferred pain management   Depression    Fibroid    Fibromyalgia    Hypertension    Lupus nephritis ([place])    Migraines    Neuropathy    Osteopenia 09/2017   T score -2.0 improved from prior study   Premature menopause age 77   following chemotherapy for Lupus   Shingles      PAST SURGICAL HISTORY: Past Surgical History:  Procedure Laterality Date   cervical fossa injections  2020   CESAREAN SECTION     COLPOSCOPY     HYSTEROSCOPY     Myomectomy   MYOMECTOMY     Hysteroscopic myomectomy   NASAL SINUS SURGERY  01/2016   thumb surg  07/07/2019   TUBAL LIGATION     WRIST SURGERY  Right 06/21/2020     FAMILY HISTORY: Family History  Problem Relation Age of Onset   Hypertension Mother    Asthma Mother    COPD Mother    Diabetes Father    Hypertension Father    Heart disease Father    Cancer Father        prostate     SOCIAL HISTORY: Social History   Socioeconomic History   Marital status: Married    Spouse name: [name]   Number of children: 2   Years of education: College   Highest education level: Not on file  Occupational History    Employer: DISABLED    Comment: disability  Tobacco Use   Smoking status: Never   Smokeless tobacco: Never  Vaping Use   Vaping Use: Never used  Substance and Sexual Activity   Alcohol use: No    Alcohol/week: 0.0 standard drinks   Drug use: No   Sexual  activity: Not Currently    Birth control/protection: Surgical, Post-menopausal    Comment: Tubal lig-1st intercourse 55 yo-Fewer than 5 partners  Other Topics Concern   Not on file  Social History Narrative   Patient lives at home with her daughter. She is currently separated (not legally).   Caffeine Use: 1 cup of coffee daily.    Right-handed   Social Determinants of Health   Financial Resource Strain: Not on file  Food Insecurity: Not on file  Transportation Needs: Not on file  Physical Activity: Not on file  Stress: Not on file  Social Connections: Not on file  Intimate Partner Violence: Not on file      PHYSICAL EXAM  Vitals:   01/19/21 0755  BP: (!) 151/82  Pulse: 60  Weight: 214 lb 8 oz (97.3 kg)  Height: 5\' 4"  (1.626 m)    Body mass index is 36.82 kg/m.   Generalized: Well developed, in no acute distress   Neurological examination  Mentation: Alert oriented to time, place, history taking. Follows all commands speech and language fluent Cranial nerve II-XII: Pupils were equal round reactive to light. Extraocular movements were full, visual field were full  Motor: The motor testing reveals 5 over 5 strength of all 4 extremities. Good symmetric motor tone is noted throughout.  Gait and station: Gait is normal.      DIAGNOSTIC DATA (LABS, IMAGING, TESTING) - I reviewed patient records, labs, notes, testing and imaging myself where available.  Lab Results  Component Value Date   WBC 5.4 12/17/2020   HGB 13.5 12/17/2020   HCT 40.2 12/17/2020   MCV 86 12/17/2020   PLT 242 12/17/2020      Component Value Date/Time   NA 144 11/18/2020 1553   NA 142 07/01/2013 1104   K 5.2 11/18/2020 1553   K 4.2 07/01/2013 1104   CL 101 11/18/2020 1553   CO2 28 11/18/2020 1553   CO2 26 07/01/2013 1104   GLUCOSE 90 11/18/2020 1553   GLUCOSE 61 (L) 09/10/2015 0833   GLUCOSE 94 07/01/2013 1104   BUN 17 11/18/2020 1553   BUN 12.7 07/01/2013 1104   CREATININE 1.11 (H)  11/18/2020 1553   CREATININE 0.98 09/10/2015 0833   CREATININE 0.9 07/01/2013 1104   CALCIUM 9.3 11/18/2020 1553   CALCIUM 9.5 07/01/2013 1104   PROT 6.8 07/09/2020 0903   PROT 6.7 07/01/2013 1104   ALBUMIN 3.9 07/09/2020 0903   ALBUMIN 3.9 07/01/2013 1104   AST 22 07/09/2020 0903   AST 47 (H) 07/01/2013 1104  ALT 16 07/09/2020 0903   ALT 36 07/01/2013 1104   ALKPHOS 115 07/09/2020 0903   ALKPHOS 80 07/01/2013 1104   BILITOT <0.2 07/09/2020 0903   BILITOT 0.48 07/01/2013 1104   GFRNONAA 83 05/18/2020 1024   GFRAA 96 05/18/2020 1024   Lab Results  Component Value Date   CHOL 165 07/09/2020   HDL 59 07/09/2020   LDLCALC 90 07/09/2020   TRIG 87 07/09/2020   CHOLHDL 2.8 07/09/2020   Lab Results  Component Value Date   HGBA1C 5.4 09/10/2018   Lab Results  Component Value Date   VITAMINB12 >2000 (H) 09/10/2018   Lab Results  Component Value Date   TSH 1.27 09/10/2015      ASSESSMENT AND PLAN  55 y.o. year old female  has a past medical history of Anxiety, Arthritis, Atrophic vaginitis, Atypical chest pain, Baker's cyst (03/28/2012), Chest pain, CIN I (cervical intraepithelial neoplasia I) (1990), Degenerative disc disease, cervical (2020), Depression, Fibroid, Fibromyalgia, Hypertension, Lupus nephritis ([place]), Migraines, Neuropathy, Osteopenia (09/2017), Premature menopause (age 60), and Shingles. here with   Migraine without aura and without status migrainosus, not intractable - Plan: naratriptan (AMERGE) 2.5 MG tablet   Tere reports that headaches have improved. She is tolerating medications. We will continue Qulipta 60mg  daily and either Nurtec or naratriptan for abortive therapy. May combine Nurtec and naratriptan with Tylenol, Benadryl and Nsaid for intractable migraines. She will continue close follow up with psychiatry and PCP.  Healthy lifestyle habits encouraged. She will follow up with me in 1 year.    Debbora Presto, MSN, FNP-C 01/19/2021, 8:26 AM  [facility]  [facility] [address] [place], [place] [address] [phone]

## 2021-01-19 ENCOUNTER — Other Ambulatory Visit: Payer: Self-pay

## 2021-01-19 ENCOUNTER — Ambulatory Visit (INDEPENDENT_AMBULATORY_CARE_PROVIDER_SITE_OTHER): Payer: 59 | Admitting: Family Medicine

## 2021-01-19 ENCOUNTER — Encounter: Payer: Self-pay | Admitting: Family Medicine

## 2021-01-19 VITALS — BP 151/82 | HR 60 | Ht 64.0 in | Wt 214.5 lb

## 2021-01-19 DIAGNOSIS — G43009 Migraine without aura, not intractable, without status migrainosus: Secondary | ICD-10-CM | POA: Diagnosis not present

## 2021-01-19 MED ORDER — QULIPTA 60 MG PO TABS: 60.0000 mg | ORAL_TABLET | Freq: Every day | ORAL | 3 refills | Status: DC

## 2021-01-19 MED ORDER — NARATRIPTAN HCL 2.5 MG PO TABS: 2.5000 mg | ORAL_TABLET | ORAL | 5 refills | Status: DC | PRN

## 2021-01-19 MED ORDER — NURTEC 75 MG PO TBDP: 75.0000 mg | ORAL_TABLET | Freq: Every day | ORAL | 11 refills | Status: DC | PRN

## 2021-01-19 NOTE — Addendum Note (Signed)
Addended byUbaldo Glassing, Jubal Rademaker L on: 01/19/2021 11:58 AM   Modules accepted: Orders

## 2021-01-26 MED ORDER — AMLODIPINE BESYLATE 2.5 MG PO TABS: ORAL_TABLET | ORAL | 3 refills | Status: DC

## 2021-01-26 NOTE — Telephone Encounter (Signed)
She had one isolated episode of soft BP but BP mostly above goal. So let's have her increase Amlodipine to 5mg  in the morning and 2.5mg  in the evening. She previously had some trouble with hypotension when on 5mg  twice daily so we will try to increase slowly to make sure she tolerates this OK. Patient should continue to monitor her BP like she is doing and let us know if BP consistently >130/80.  Thank you!

## 2021-01-27 ENCOUNTER — Other Ambulatory Visit: Payer: Self-pay | Admitting: Cardiovascular Disease

## 2021-02-03 NOTE — Telephone Encounter (Signed)
There is a wide variety in those 3 readings so I would just continue current medications for now. She should still have the PRN Hydralazine that she can take as needed if systolic BP >973. However, if she is needing this a lot she should let us know.    Thank you!

## 2021-02-04 ENCOUNTER — Other Ambulatory Visit: Payer: Self-pay | Admitting: Nurse Practitioner

## 2021-02-04 DIAGNOSIS — Z1231 Encounter for screening mammogram for malignant neoplasm of breast: Secondary | ICD-10-CM

## 2021-03-02 ENCOUNTER — Other Ambulatory Visit: Payer: Self-pay

## 2021-03-02 ENCOUNTER — Ambulatory Visit (INDEPENDENT_AMBULATORY_CARE_PROVIDER_SITE_OTHER): Payer: 59 | Admitting: Podiatry

## 2021-03-02 DIAGNOSIS — L600 Ingrowing nail: Secondary | ICD-10-CM

## 2021-03-02 DIAGNOSIS — B353 Tinea pedis: Secondary | ICD-10-CM

## 2021-03-02 MED ORDER — CLOTRIMAZOLE-BETAMETHASONE 1-0.05 % EX CREA
1.0000 | TOPICAL_CREAM | Freq: Two times a day (BID) | CUTANEOUS | 0 refills | Status: DC
Start: 2021-03-02 — End: 2021-10-24

## 2021-03-02 MED ORDER — CLOTRIMAZOLE-BETAMETHASONE 1-0.05 % EX CREA: 1.0000 "application " | TOPICAL_CREAM | Freq: Two times a day (BID) | CUTANEOUS | 0 refills | Status: DC

## 2021-03-08 ENCOUNTER — Encounter: Payer: Self-pay | Admitting: Podiatry

## 2021-03-08 NOTE — Progress Notes (Signed)
Subjective:  Patient ID: Marie Peterson, female    DOB: 10/10/1965,  MRN: [id number]  Chief Complaint  Patient presents with   Nail Problem    Left hallux nail     55 y.o. female presents with the above complaint.  Patient presents with left lateral border ingrown.  Patient states is painful to touch.  Patient would like to have it removed.  Has progressive gotten worse.  Hurts with ambulation.  She also has secondary complaint of epidermal lysis of the left plantar foot with subjective component of itching.  She states she is tried some over-the-counter stuff none of which has helped.  She would like to try prescription medication.  She denies any other acute complaints   Review of Systems: Negative except as noted in the HPI. Denies N/V/F/Ch.  Past Medical History:  Diagnosis Date   Anxiety    Arthritis    Atrophic vaginitis    Atypical chest pain    thought to be GERD; myoview 05/17/07-no significant ischemia; echo 07/20/11- normal EF=>55%   Baker's cyst 03/28/2012   venous doppler 03/28/12=no thrombus; baker's cyst at left popliteal fossa   Chest pain    CIN I (cervical intraepithelial neoplasia I) 1990   Degenerative disc disease, cervical 2020   severe, C3-4, 4-5, 5-6. Currently receiving injections by Dr. Vira Blanco of preferred pain management   Depression    Fibroid    Fibromyalgia    Hypertension    Lupus nephritis ([place])    Migraines    Neuropathy    Osteopenia 09/2017   T score -2.0 improved from prior study   Premature menopause age 32   following chemotherapy for Lupus   Shingles     Current Outpatient Medications:    clotrimazole-betamethasone (LOTRISONE) cream, Apply 1 application topically 2 (two) times daily., Disp: 30 g, Rfl: 0   alendronate (FOSAMAX) 70 MG tablet, Take 1 tablet (70 mg total) by mouth once a week. Take with a full glass of water on an empty stomach., Disp: 12 tablet, Rfl: 4   amLODipine (NORVASC) 2.5 MG tablet, Take 1 tablet (2.5 mg total) by  mouth every evening AND 2 tablets (5 mg total) every morning., Disp: 60 tablet, Rfl: 3   aspirin 81 MG tablet, Take 81 mg by mouth daily., Disp: , Rfl:    Atogepant (QULIPTA) 60 MG TABS, Take 60 mg by mouth daily., Disp: 90 tablet, Rfl: 3   atorvastatin (LIPITOR) 40 MG tablet, Take 1 tablet (40 mg total) by mouth daily., Disp: 90 tablet, Rfl: 3   Cholecalciferol (VITAMIN D PO), Take 2,000 Int'l Units by mouth daily. , Disp: , Rfl:    clotrimazole-betamethasone (LOTRISONE) cream, Apply 1 application topically 2 (two) times daily., Disp: 30 g, Rfl: 0   diclofenac sodium (VOLTAREN) 1 % GEL, Apply topically., Disp: , Rfl:    FERREX 150 150 MG capsule, Take 1 tablet by mouth daily., Disp: , Rfl: 1   folic acid (FOLVITE) 295 MCG tablet, Take 400 mcg by mouth daily., Disp: , Rfl:    hydrALAZINE (APRESOLINE) 10 MG tablet, TAKE 2 TABLETS(20 MG) BY MOUTH TWICE DAILY AS NEEDED FOR SYSTOLIC BLOOD PRESSURE OVER 150, Disp: 180 tablet, Rfl: 1   HYDROcodone-acetaminophen (NORCO/VICODIN) 5-325 MG tablet, Take 1 tablet by mouth every 6 (six) hours as needed for severe pain., Disp: 10 tablet, Rfl: 0   hydroxychloroquine (PLAQUENIL) 200 MG tablet, Take 200 mg by mouth 2 (two) times daily. , Disp: , Rfl: 0   L-Methylfolate-B6-B12 (  METANX PO), Take 1 tablet by mouth 2 (two) times daily. , Disp: , Rfl:    lidocaine (LIDODERM) 5 %, Place 3 patches onto the skin daily. Remove & Discard patch within 12 hours or as directed by MD, Disp: 90 patch, Rfl: 6   LORazepam (ATIVAN) 0.5 MG tablet, Take 0.5 mg by mouth daily as needed., Disp: , Rfl:    MAGnesium-Oxide 400 (240 Mg) MG tablet, TAKE 1 TABLET(400 MG) BY MOUTH DAILY, Disp: 90 tablet, Rfl: 1   Multiple Vitamin (MULTIVITAMIN) tablet, Take 1 tablet by mouth daily., Disp: , Rfl:    naratriptan (AMERGE) 2.5 MG tablet, Take 1 tablet (2.5 mg total) by mouth as needed for migraine. Take one (1) tablet at onset of headache; if returns or does not resolve, may repeat after 4 hours;  do not exceed five (5) mg in 24 hours., Disp: 10 tablet, Rfl: 5   Omega-3 Fatty Acids (FISH OIL) 1000 MG CAPS, Take 2 capsules by mouth daily., Disp: , Rfl:    omeprazole (PRILOSEC) 40 MG capsule, Take 1 capsule (40 mg total) by mouth in the morning and at bedtime., Disp: 60 capsule, Rfl: 5   ondansetron (ZOFRAN-ODT) 4 MG disintegrating tablet, DISSOLVE 1 TABLET ON THE TONGUE EVERY 8 HOURS AS NEEDED FOR NAUSEA OR VOMITING, Disp: 20 tablet, Rfl: 11   oxyCODONE-acetaminophen (PERCOCET) 7.5-325 MG tablet, Take 1 tablet by mouth every 8 (eight) hours., Disp: , Rfl:    potassium chloride SA (KLOR-CON) 20 MEQ tablet, Take 1 tablet (20 mEq total) by mouth 2 (two) times daily., Disp: 180 tablet, Rfl: 3   predniSONE (DELTASONE) 5 MG tablet, Take 5 mg by mouth every 3 (three) days. Take one tablet every 3 days, Disp: , Rfl:    pregabalin (LYRICA) 200 MG capsule, Take 200 mg by mouth daily., Disp: , Rfl:    PREVIDENT 5000 PLUS 1.1 % CREA dental cream, SMARTSIG:Sparingly By Mouth Twice Daily, Disp: , Rfl:    Rimegepant Sulfate (NURTEC) 75 MG TBDP, Take 75 mg by mouth daily as needed (take for abortive therapy of migraine, no more than 1 tablet in 24 hours or 8 per month)., Disp: 8 tablet, Rfl: 11   sertraline (ZOLOFT) 100 MG tablet, sertraline 100 mg tablet  TAKE 1 TABLET BY MOUTH EVERY DAY, Disp: , Rfl:    sertraline (ZOLOFT) 25 MG tablet, Take by mouth with 100 mg tablet for total of 125 mg at bedtime., Disp: , Rfl:    Suvorexant (BELSOMRA) 20 MG TABS, Take 20 mg by mouth Nightly. Take one tablet at bedtime daily, Disp: , Rfl:    tiZANidine (ZANAFLEX) 4 MG tablet, 1 tablet as needed, Disp: , Rfl:    tolterodine (DETROL LA) 4 MG 24 hr capsule, Take 4 mg by mouth daily., Disp: , Rfl:    torsemide (DEMADEX) 20 MG tablet, Take 2 tablets (40 mg total) by mouth 2 (two) times daily. Change in quantity to see if ins will cover. Thank you., Disp: 360 tablet, Rfl: 3   XIIDRA 5 % SOLN, Place 1 drop into both eyes 2 (two)  times daily., Disp: , Rfl:   Social History   Tobacco Use  Smoking Status Never  Smokeless Tobacco Never    Allergies  Allergen Reactions   Ubrogepant Swelling    Lip swelling    Benazepril     Lip swelling   Cymbalta [Duloxetine Hcl]     Lip swelling   Hygroton [Chlorthalidone] Swelling    Swelling in lips  Mirabegron Itching    Other reaction(s): rash   Other     Other reaction(s): Unknown   Duloxetine Rash    Lip swelling   Objective:  There were no vitals filed for this visit. There is no height or weight on file to calculate BMI. Constitutional Well developed. Well nourished.  Vascular Dorsalis pedis pulses palpable bilaterally. Posterior tibial pulses palpable bilaterally. Capillary refill normal to all digits.  No cyanosis or clubbing noted. Pedal hair growth normal.  Neurologic Normal speech. Oriented to person, place, and time. Epicritic sensation to light touch grossly present bilaterally.  Dermatologic Painful ingrowing nail at lateral nail borders of the hallux nail left. No other open wounds. Left plantar epidermal lysis with subjective component of itching noted.  No xerosis noted.  No skin fissures noted  Orthopedic: Normal joint ROM without pain or crepitus bilaterally. No visible deformities. No bony tenderness.   Radiographs: None Assessment:   1. Athlete's foot on left   2. Ingrown left big toenail    Plan:  Patient was evaluated and treated and all questions answered.  Left athlete's foot -I explained to the patient the etiology of athlete's foot and various treatment options were discussed.  Given that she has failed all over-the-counter medication I believe she would benefit from prescription lotion.  Lotrisone cream was sent to the pharmacy.  I have asked her to apply twice a day.  She states understanding.  Ingrown Nail, left -Patient elects to proceed with minor surgery to remove ingrown toenail removal today. Consent reviewed and  signed by patient. -Ingrown nail excised. See procedure note. -Educated on post-procedure care including soaking. Written instructions provided and reviewed. -Patient to follow up in 2 weeks for nail check.  Procedure: Excision of Ingrown Toenail Location: Left 1st toe lateral nail borders. Anesthesia: Lidocaine 1% plain; 1.5 mL and Marcaine 0.5% plain; 1.5 mL, digital block. Skin Prep: Betadine. Dressing: Silvadene; telfa; dry, sterile, compression dressing. Technique: Following skin prep, the toe was exsanguinated and a tourniquet was secured at the base of the toe. The affected nail border was freed, split with a nail splitter, and excised. Chemical matrixectomy was then performed with phenol and irrigated out with alcohol. The tourniquet was then removed and sterile dressing applied. Disposition: Patient tolerated procedure well. Patient to return in 2 weeks for follow-up.   No follow-ups on file.

## 2021-03-15 ENCOUNTER — Ambulatory Visit: Payer: 59

## 2021-03-24 ENCOUNTER — Ambulatory Visit
Admission: RE | Admit: 2021-03-24 | Discharge: 2021-03-24 | Disposition: A | Discharge status: Home / Self Care | Payer: 59 | Source: Ambulatory Visit | Attending: Nurse Practitioner | Admitting: Nurse Practitioner

## 2021-03-24 ENCOUNTER — Other Ambulatory Visit: Payer: Self-pay

## 2021-03-24 DIAGNOSIS — Z1231 Encounter for screening mammogram for malignant neoplasm of breast: Secondary | ICD-10-CM

## 2021-03-24 DIAGNOSIS — E785 Hyperlipidemia, unspecified: Secondary | ICD-10-CM

## 2021-03-24 DIAGNOSIS — I10 Essential (primary) hypertension: Secondary | ICD-10-CM

## 2021-03-24 DIAGNOSIS — R600 Localized edema: Secondary | ICD-10-CM

## 2021-03-24 DIAGNOSIS — Z79899 Other long term (current) drug therapy: Secondary | ICD-10-CM

## 2021-03-24 LAB — HEPATIC FUNCTION PANEL
ALT: 23 IU/L (ref 0–32)
AST: 35 IU/L (ref 0–40)
Albumin: 4 g/dL (ref 3.8–4.9)
Alkaline Phosphatase: 146 IU/L — ABNORMAL HIGH (ref 44–121)
Bilirubin Total: <0.2 mg/dL (ref 0.0–1.2)
Bilirubin, Direct: <0.1 mg/dL (ref 0.00–0.40)
Total Protein: 6.7 g/dL (ref 6.0–8.5)

## 2021-03-24 LAB — LIPID PANEL
Chol/HDL Ratio: 2.1 ratio (ref 0.0–4.4)
Cholesterol, Total: 135 mg/dL (ref 100–199)
HDL: 64 mg/dL (ref >39)
LDL Chol Calc (NIH): 59 mg/dL (ref 0–99)
Triglycerides: 54 mg/dL (ref 0–149)
VLDL Cholesterol Cal: 12 mg/dL (ref 5–40)

## 2021-03-24 LAB — BASIC METABOLIC PANEL
BUN/Creatinine Ratio: 11 (ref 9–23)
BUN: 10 mg/dL (ref 6–24)
CO2: 29 mmol/L (ref 20–29)
Calcium: 9.6 mg/dL (ref 8.7–10.2)
Chloride: 103 mmol/L (ref 96–106)
Creatinine, Ser: 0.93 mg/dL (ref 0.57–1.00)
Glucose: 87 mg/dL (ref 70–99)
Potassium: 4.4 mmol/L (ref 3.5–5.2)
Sodium: 143 mmol/L (ref 134–144)
eGFR: 72 mL/min/{1.73_m2} (ref >59)

## 2021-03-25 ENCOUNTER — Other Ambulatory Visit: Payer: Self-pay

## 2021-03-25 DIAGNOSIS — R600 Localized edema: Secondary | ICD-10-CM

## 2021-03-25 DIAGNOSIS — I5032 Chronic diastolic (congestive) heart failure: Secondary | ICD-10-CM

## 2021-03-25 DIAGNOSIS — Z79899 Other long term (current) drug therapy: Secondary | ICD-10-CM

## 2021-03-30 ENCOUNTER — Other Ambulatory Visit: Payer: Self-pay

## 2021-03-30 ENCOUNTER — Ambulatory Visit (INDEPENDENT_AMBULATORY_CARE_PROVIDER_SITE_OTHER): Payer: 59 | Admitting: Podiatry

## 2021-03-30 DIAGNOSIS — L600 Ingrowing nail: Secondary | ICD-10-CM | POA: Diagnosis not present

## 2021-03-30 DIAGNOSIS — M25371 Other instability, right ankle: Secondary | ICD-10-CM

## 2021-03-31 ENCOUNTER — Other Ambulatory Visit: Payer: Self-pay | Admitting: *Deleted

## 2021-03-31 DIAGNOSIS — R0602 Shortness of breath: Secondary | ICD-10-CM

## 2021-04-01 ENCOUNTER — Other Ambulatory Visit: Payer: Self-pay

## 2021-04-01 ENCOUNTER — Ambulatory Visit (INDEPENDENT_AMBULATORY_CARE_PROVIDER_SITE_OTHER): Payer: 59 | Admitting: Internal Medicine

## 2021-04-01 ENCOUNTER — Encounter: Payer: Self-pay | Admitting: Internal Medicine

## 2021-04-01 ENCOUNTER — Ambulatory Visit: Payer: 59 | Admitting: Cardiovascular Disease

## 2021-04-01 VITALS — BP 112/66 | HR 64 | Temp 97.8°F | Ht 64.0 in | Wt 221.2 lb

## 2021-04-01 DIAGNOSIS — R0602 Shortness of breath: Secondary | ICD-10-CM | POA: Diagnosis not present

## 2021-04-01 LAB — PULMONARY FUNCTION TEST
DL/VA % pred: 103 %
DL/VA: 4.38 ml/min/mmHg/L
DLCO cor % pred: 79 %
DLCO cor: 16.24 ml/min/mmHg
DLCO unc % pred: 79 %
DLCO unc: 16.24 ml/min/mmHg
FEF 25-75 Post: 2.06 L/sec
FEF 25-75 Pre: 1.63 L/sec
FEF2575-%Change-Post: 25 %
FEF2575-%Pred-Post: 92 %
FEF2575-%Pred-Pre: 73 %
FEV1-%Change-Post: 4 %
FEV1-%Pred-Post: 81 %
FEV1-%Pred-Pre: 78 %
FEV1-Post: 1.79 L
FEV1-Pre: 1.71 L
FEV1FVC-%Change-Post: 10 %
FEV1FVC-%Pred-Pre: 99 %
FEV6-%Change-Post: -4 %
FEV6-%Pred-Post: 76 %
FEV6-%Pred-Pre: 80 %
FEV6-Post: 2.04 L
FEV6-Pre: 2.14 L
FEV6FVC-%Change-Post: 0 %
FEV6FVC-%Pred-Post: 103 %
FEV6FVC-%Pred-Pre: 102 %
FVC-%Change-Post: -5 %
FVC-%Pred-Post: 73 %
FVC-%Pred-Pre: 77 %
FVC-Post: 2.04 L
FVC-Pre: 2.15 L
Post FEV1/FVC ratio: 88 %
Post FEV6/FVC ratio: 100 %
Pre FEV1/FVC ratio: 80 %
Pre FEV6/FVC Ratio: 100 %
RV % pred: 67 %
RV: 1.29 L
TLC % pred: 71 %
TLC: 3.59 L

## 2021-04-01 NOTE — Progress Notes (Signed)
PFT done today. 

## 2021-04-01 NOTE — Patient Instructions (Signed)
Follow up with me as needed. Let me know if symptoms change.   Remember: movement is medicine!

## 2021-04-01 NOTE — Progress Notes (Signed)
Subjective:  Patient ID: Marie Peterson, female    DOB: Jul 07, 1965,  MRN: [id number]  Chief Complaint  Patient presents with   Nail Problem    Right hallux possible ingrown  Left hallux nail check     56 y.o. female presents with the above complaint.  Patient presents with complaint of right hallux lateral border ingrown.  Patient states is painful to touch.  She states has progressed and gotten worse.  She wanted to get it removed.  She states that it has been hurting especially with ambulation.  She also has a complaint of right ankle instability.  She feels like her ankle is giving out on the right side when she is ambulating.  She does have history of some ankle sprains in the past.  It is very mild in nature.  She wanted know if there is any current bracing something that can help with this.  She denies any other acute complaints.   Review of Systems: Negative except as noted in the HPI. Denies N/V/F/Ch.  Past Medical History:  Diagnosis Date   Anxiety    Arthritis    Atrophic vaginitis    Atypical chest pain    thought to be GERD; myoview 05/17/07-no significant ischemia; echo 07/20/11- normal EF=>55%   Baker's cyst 03/28/2012   venous doppler 03/28/12=no thrombus; baker's cyst at left popliteal fossa   Chest pain    CIN I (cervical intraepithelial neoplasia I) 1990   Degenerative disc disease, cervical 2020   severe, C3-4, 4-5, 5-6. Currently receiving injections by Dr. Vira Blanco of preferred pain management   Depression    Fibroid    Fibromyalgia    Hypertension    Lupus nephritis ([place])    Migraines    Neuropathy    Osteopenia 09/2017   T score -2.0 improved from prior study   Premature menopause age 68   following chemotherapy for Lupus   Shingles     Current Outpatient Medications:    alendronate (FOSAMAX) 70 MG tablet, Take 1 tablet (70 mg total) by mouth once a week. Take with a full glass of water on an empty stomach., Disp: 12 tablet, Rfl: 4   amLODipine (NORVASC)  2.5 MG tablet, Take 1 tablet (2.5 mg total) by mouth every evening AND 2 tablets (5 mg total) every morning., Disp: 60 tablet, Rfl: 3   aspirin 81 MG tablet, Take 81 mg by mouth daily., Disp: , Rfl:    Atogepant (QULIPTA) 60 MG TABS, Take 60 mg by mouth daily., Disp: 90 tablet, Rfl: 3   atorvastatin (LIPITOR) 40 MG tablet, Take 1 tablet (40 mg total) by mouth daily., Disp: 90 tablet, Rfl: 3   Cholecalciferol (VITAMIN D PO), Take 2,000 Int'l Units by mouth daily. , Disp: , Rfl:    clotrimazole-betamethasone (LOTRISONE) cream, Apply 1 application topically 2 (two) times daily., Disp: 30 g, Rfl: 0   clotrimazole-betamethasone (LOTRISONE) cream, Apply 1 application topically 2 (two) times daily., Disp: 30 g, Rfl: 0   diclofenac sodium (VOLTAREN) 1 % GEL, Apply topically., Disp: , Rfl:    FERREX 150 150 MG capsule, Take 1 tablet by mouth daily., Disp: , Rfl: 1   folic acid (FOLVITE) 409 MCG tablet, Take 400 mcg by mouth daily., Disp: , Rfl:    hydrALAZINE (APRESOLINE) 10 MG tablet, TAKE 2 TABLETS(20 MG) BY MOUTH TWICE DAILY AS NEEDED FOR SYSTOLIC BLOOD PRESSURE OVER 150, Disp: 180 tablet, Rfl: 1   HYDROcodone-acetaminophen (NORCO/VICODIN) 5-325 MG tablet, Take 1 tablet by  mouth every 6 (six) hours as needed for severe pain., Disp: 10 tablet, Rfl: 0   hydroxychloroquine (PLAQUENIL) 200 MG tablet, Take 200 mg by mouth 2 (two) times daily. , Disp: , Rfl: 0   L-Methylfolate-B6-B12 (METANX PO), Take 1 tablet by mouth 2 (two) times daily. , Disp: , Rfl:    lidocaine (LIDODERM) 5 %, Place 3 patches onto the skin daily. Remove & Discard patch within 12 hours or as directed by MD, Disp: 90 patch, Rfl: 6   LORazepam (ATIVAN) 0.5 MG tablet, Take 0.5 mg by mouth daily as needed., Disp: , Rfl:    MAGnesium-Oxide 400 (240 Mg) MG tablet, TAKE 1 TABLET(400 MG) BY MOUTH DAILY, Disp: 90 tablet, Rfl: 1   Multiple Vitamin (MULTIVITAMIN) tablet, Take 1 tablet by mouth daily., Disp: , Rfl:    naratriptan (AMERGE) 2.5 MG  tablet, Take 1 tablet (2.5 mg total) by mouth as needed for migraine. Take one (1) tablet at onset of headache; if returns or does not resolve, may repeat after 4 hours; do not exceed five (5) mg in 24 hours., Disp: 10 tablet, Rfl: 5   Omega-3 Fatty Acids (FISH OIL) 1000 MG CAPS, Take 2 capsules by mouth daily., Disp: , Rfl:    omeprazole (PRILOSEC) 40 MG capsule, Take 1 capsule (40 mg total) by mouth in the morning and at bedtime., Disp: 60 capsule, Rfl: 5   ondansetron (ZOFRAN-ODT) 4 MG disintegrating tablet, DISSOLVE 1 TABLET ON THE TONGUE EVERY 8 HOURS AS NEEDED FOR NAUSEA OR VOMITING, Disp: 20 tablet, Rfl: 11   oxyCODONE-acetaminophen (PERCOCET) 7.5-325 MG tablet, Take 1 tablet by mouth every 8 (eight) hours., Disp: , Rfl:    potassium chloride SA (KLOR-CON) 20 MEQ tablet, Take 1 tablet (20 mEq total) by mouth 2 (two) times daily., Disp: 180 tablet, Rfl: 3   predniSONE (DELTASONE) 5 MG tablet, Take 5 mg by mouth every 3 (three) days. Take one tablet every 3 days, Disp: , Rfl:    pregabalin (LYRICA) 200 MG capsule, Take 200 mg by mouth daily., Disp: , Rfl:    PREVIDENT 5000 PLUS 1.1 % CREA dental cream, SMARTSIG:Sparingly By Mouth Twice Daily, Disp: , Rfl:    Rimegepant Sulfate (NURTEC) 75 MG TBDP, Take 75 mg by mouth daily as needed (take for abortive therapy of migraine, no more than 1 tablet in 24 hours or 8 per month)., Disp: 8 tablet, Rfl: 11   sertraline (ZOLOFT) 100 MG tablet, sertraline 100 mg tablet  TAKE 1 TABLET BY MOUTH EVERY DAY, Disp: , Rfl:    sertraline (ZOLOFT) 25 MG tablet, Take by mouth with 100 mg tablet for total of 125 mg at bedtime., Disp: , Rfl:    Suvorexant (BELSOMRA) 20 MG TABS, Take 20 mg by mouth Nightly. Take one tablet at bedtime daily, Disp: , Rfl:    tiZANidine (ZANAFLEX) 4 MG tablet, 1 tablet as needed, Disp: , Rfl:    tolterodine (DETROL LA) 4 MG 24 hr capsule, Take 4 mg by mouth daily., Disp: , Rfl:    torsemide (DEMADEX) 20 MG tablet, Take 2 tablets (40 mg  total) by mouth 2 (two) times daily. Change in quantity to see if ins will cover. Thank you., Disp: 360 tablet, Rfl: 3   XIIDRA 5 % SOLN, Place 1 drop into both eyes 2 (two) times daily., Disp: , Rfl:   Social History   Tobacco Use  Smoking Status Never  Smokeless Tobacco Never    Allergies  Allergen Reactions  Ubrogepant Swelling    Lip swelling    Benazepril     Lip swelling   Cymbalta [Duloxetine Hcl]     Lip swelling   Hygroton [Chlorthalidone] Swelling    Swelling in lips   Mirabegron Itching    Other reaction(s): rash   Other     Other reaction(s): Unknown   Duloxetine Rash    Lip swelling   Objective:  There were no vitals filed for this visit. There is no height or weight on file to calculate BMI. Constitutional Well developed. Well nourished.  Vascular Dorsalis pedis pulses palpable bilaterally. Posterior tibial pulses palpable bilaterally. Capillary refill normal to all digits.  No cyanosis or clubbing noted. Pedal hair growth normal.  Neurologic Normal speech. Oriented to person, place, and time. Epicritic sensation to light touch grossly present bilaterally.  Dermatologic Painful ingrowing nail at lateral nail borders of the hallux nail right. No other open wounds. No skin lesions.  Orthopedic: Right ankle instability noted with positive Romberg test.  Negative anterior drawer test or talar tilt test.  Mild pain on palpation at the ATFL ligament.  This is very mild in nature.  No gross dislocations noted.  No tenderness noticed on medial and lateral malleolus.  No Achilles tendon insertional pain noted.  No posterior tibial tendon pain noted no peroneal tendon pain noted   Radiographs: None Assessment:   1. Ankle instability, right   2. Ingrown toenail of right foot    Plan:  Patient was evaluated and treated and all questions answered.  Right ankle instability mild -I explained the patient etiology of instability and worse treatment options were  discussed.  I believe she would benefit from a Tri-Lock ankle brace to give her some stability while she is ambulating.  At this time we will not concerned for any kind of fractures that she does not have been history of trauma nor does she have any tenderness over any bones.  She has very mild pain near the ATFL ligament likely the cause of the instability.  I discussed the Tri-Lock ankle brace with patient extensive detail and the benefit of it she states understand like to proceed with ankle brace -Follow-up ankle brace was dispensed  Ingrown Nail, right -Patient elects to proceed with minor surgery to remove ingrown toenail removal today. Consent reviewed and signed by patient. -Ingrown nail excised. See procedure note. -Educated on post-procedure care including soaking. Written instructions provided and reviewed. -Patient to follow up in 2 weeks for nail check.  Procedure: Excision of Ingrown Toenail Location: Right 1st toe lateral nail borders. Anesthesia: Lidocaine 1% plain; 1.5 mL and Marcaine 0.5% plain; 1.5 mL, digital block. Skin Prep: Betadine. Dressing: Silvadene; telfa; dry, sterile, compression dressing. Technique: Following skin prep, the toe was exsanguinated and a tourniquet was secured at the base of the toe. The affected nail border was freed, split with a nail splitter, and excised. Chemical matrixectomy was then performed with phenol and irrigated out with alcohol. The tourniquet was then removed and sterile dressing applied. Disposition: Patient tolerated procedure well. Patient to return in 2 weeks for follow-up.   No follow-ups on file.

## 2021-04-01 NOTE — Progress Notes (Signed)
Marie Peterson    [id number]    [birth year]  Primary Care Physician:Kim, Jeneen Rinks, MD Date of Appointment: 04/01/2021 Established Patient Visit  Chief complaint:   Chief Complaint  Patient presents with   Follow-up    Get PFT results.  No concerns.     HPI: Marie Peterson is a 56 y.o. woman with history of HTN and HFpEF as well as SLE with nephritis. Presented October 2022 with shortness of breath.  Interval Updates: Here for follow up after PFTs. They show mild restriction to ventilation secondary to body habitus.   I have reviewed the patient's family social and past medical history and updated as appropriate.   Past Medical History:  Diagnosis Date   Anxiety    Arthritis    Atrophic vaginitis    Atypical chest pain    thought to be GERD; myoview 05/17/07-no significant ischemia; echo 07/20/11- normal EF=>55%   Baker's cyst 03/28/2012   venous doppler 03/28/12=no thrombus; baker's cyst at left popliteal fossa   Chest pain    CIN I (cervical intraepithelial neoplasia I) 1990   Degenerative disc disease, cervical 2020   severe, C3-4, 4-5, 5-6. Currently receiving injections by Dr. Vira Blanco of preferred pain management   Depression    Fibroid    Fibromyalgia    Hypertension    Lupus nephritis ([place])    Migraines    Neuropathy    Osteopenia 09/2017   T score -2.0 improved from prior study   Premature menopause age 35   following chemotherapy for Lupus   Shingles     Past Surgical History:  Procedure Laterality Date   cervical fossa injections  2020   CESAREAN SECTION     COLPOSCOPY     HYSTEROSCOPY     Myomectomy   MYOMECTOMY     Hysteroscopic myomectomy   NASAL SINUS SURGERY  01/2016   thumb surg  07/07/2019   TUBAL LIGATION     WRIST SURGERY Right 06/21/2020    Family History  Problem Relation Age of Onset   Hypertension Mother    Asthma Mother    COPD Mother    Diabetes Father    Hypertension Father    Heart disease Father    Cancer Father         prostate    Social History   Occupational History    Employer: DISABLED    Comment: disability  Tobacco Use   Smoking status: Never   Smokeless tobacco: Never  Vaping Use   Vaping Use: Never used  Substance and Sexual Activity   Alcohol use: No    Alcohol/week: 0.0 standard drinks   Drug use: No   Sexual activity: Not Currently    Birth control/protection: Surgical, Post-menopausal    Comment: Tubal lig-1st intercourse 56 yo-Fewer than 5 partners     Physical Exam: Blood pressure 112/66, pulse 64, temperature 97.8 F (36.6 C), temperature source Oral, height 5\' 4"  (1.626 m), weight 221 lb 3.2 oz (100.3 kg), SpO2 94 %.  Gen:      No acute distress ENT:  no nasal polyps, mucus membranes moist Lungs:    No increased respiratory effort, symmetric chest wall excursion, clear to auscultation bilaterally, no wheezes or crackles CV:         Regular rate and rhythm; no murmurs, rubs, or gallops.  Trace non pitting pedal edema   Data Reviewed: Imaging: I have personally reviewed the chest xray from 11/2020 which  shows mild cardiomegaly but no acute process.   Echocardiogram shows LVH, no systolic dysfunction  PFTs:  PFT Results Latest Ref Rng & Units 04/01/2021  FVC-Pre L 2.15  FVC-Predicted Pre % 77  FVC-Post L 2.04  FVC-Predicted Post % 73  Pre FEV1/FVC % % 80  Post FEV1/FCV % % 88  FEV1-Pre L 1.71  FEV1-Predicted Pre % 78  FEV1-Post L 1.79  DLCO uncorrected ml/min/mmHg 16.24  DLCO UNC% % 79  DLCO corrected ml/min/mmHg 16.24  DLCO COR %Predicted % 79  DLVA Predicted % 103  TLC L 3.59  TLC % Predicted % 71  RV % Predicted % 67   I have personally reviewed the patient's PFTs and normal spirometry. Mild restriction to ventilation secondary to body habitus. Normal dlco.   Labs:  Immunization status: Immunization History  Administered Date(s) Administered   DTP 08/27/2018   Influenza Split 01/30/2012, 12/12/2012, 12/10/2013, 02/16/2015, 12/30/2015   Influenza,  Quadrivalent, Recombinant, Inj, Pf 12/14/2016, 02/28/2018, 01/13/2019, 12/11/2019   Influenza,inj,Quad PF,6+ Mos 01/12/2021   Influenza-Unspecified 12/25/2013, 02/28/2018   PFIZER(Purple Top)SARS-COV-2 Vaccination 06/26/2019, 07/21/2019, 02/27/2020   Tdap 09/02/2010, 09/30/2020    Assessment:  Shortness of breath Obesity Body mass index is 37.97 kg/m. SLE with nephritis. Last Cr 1.09 reviewed in 10/2020 in our records.  Fibromyalgia Mild le edema  Plan/Recommendations:  Dyspnea likely secondary to mild restriction from recent weight gain Confounding symptoms with depression and fibromyalgia.  No evidence of interstitial lung disease related to her autoimmune disorder I do not think inhalers will be beneficial.  Discussed movement as medicine to help with symptoms associated with dyspnea, FM.  Suspect edema likely related to HTN. Defer diuretics to cardiology/pcp. We discussed compression hose and leg elevation  Return to Care:  I am happy to see as needed if symptoms change   [name], MD [facility] [place]

## 2021-04-02 ENCOUNTER — Other Ambulatory Visit: Payer: Self-pay | Admitting: Cardiovascular Disease

## 2021-04-02 DIAGNOSIS — I10 Essential (primary) hypertension: Secondary | ICD-10-CM

## 2021-04-04 ENCOUNTER — Other Ambulatory Visit: Payer: Self-pay

## 2021-04-04 MED ORDER — AMLODIPINE BESYLATE 2.5 MG PO TABS: ORAL_TABLET | ORAL | 3 refills | Status: DC

## 2021-04-07 DIAGNOSIS — E785 Hyperlipidemia, unspecified: Secondary | ICD-10-CM | POA: Diagnosis not present

## 2021-04-07 DIAGNOSIS — M329 Systemic lupus erythematosus, unspecified: Secondary | ICD-10-CM | POA: Diagnosis not present

## 2021-04-07 DIAGNOSIS — I5022 Chronic systolic (congestive) heart failure: Secondary | ICD-10-CM | POA: Diagnosis not present

## 2021-04-07 DIAGNOSIS — M797 Fibromyalgia: Secondary | ICD-10-CM | POA: Diagnosis not present

## 2021-04-07 DIAGNOSIS — F329 Major depressive disorder, single episode, unspecified: Secondary | ICD-10-CM | POA: Diagnosis not present

## 2021-04-07 DIAGNOSIS — E559 Vitamin D deficiency, unspecified: Secondary | ICD-10-CM | POA: Diagnosis not present

## 2021-04-07 DIAGNOSIS — G4700 Insomnia, unspecified: Secondary | ICD-10-CM | POA: Diagnosis not present

## 2021-04-07 DIAGNOSIS — Z79899 Other long term (current) drug therapy: Secondary | ICD-10-CM | POA: Diagnosis not present

## 2021-04-07 DIAGNOSIS — R7303 Prediabetes: Secondary | ICD-10-CM | POA: Diagnosis not present

## 2021-04-07 DIAGNOSIS — I10 Essential (primary) hypertension: Secondary | ICD-10-CM | POA: Diagnosis not present

## 2021-04-07 DIAGNOSIS — G43909 Migraine, unspecified, not intractable, without status migrainosus: Secondary | ICD-10-CM | POA: Diagnosis not present

## 2021-04-25 ENCOUNTER — Ambulatory Visit (INDEPENDENT_AMBULATORY_CARE_PROVIDER_SITE_OTHER): Payer: 59 | Admitting: Podiatry

## 2021-04-25 ENCOUNTER — Encounter: Payer: Self-pay | Admitting: Podiatry

## 2021-04-25 ENCOUNTER — Other Ambulatory Visit: Payer: Self-pay

## 2021-04-25 DIAGNOSIS — L600 Ingrowing nail: Secondary | ICD-10-CM

## 2021-04-25 NOTE — Progress Notes (Signed)
Subjective:  Patient ID: Marie Peterson, female    DOB: [birth year],   MRN: [id number]  Chief Complaint  Patient presents with   Ingrown Toenail    Hallux right - medial border - ingrown, lateral border right and left hallux doing better    56 y.o. female presents for follow-up of right great toenail ingrown nail procedure. Relates she is doing well and has discontinued soaking. Relates now she is having trouble with the medial border and would like to have to procedure done to fix this side.  . Denies any other pedal complaints. Denies n/v/f/c.   Past Medical History:  Diagnosis Date   Anxiety    Arthritis    Atrophic vaginitis    Atypical chest pain    thought to be GERD; myoview 05/17/07-no significant ischemia; echo 07/20/11- normal EF=>55%   Baker's cyst 03/28/2012   venous doppler 03/28/12=no thrombus; baker's cyst at left popliteal fossa   Chest pain    CIN I (cervical intraepithelial neoplasia I) 1990   Degenerative disc disease, cervical 2020   severe, C3-4, 4-5, 5-6. Currently receiving injections by Dr. Vira Blanco of preferred pain management   Depression    Fibroid    Fibromyalgia    Hypertension    Lupus nephritis ([place])    Migraines    Neuropathy    Osteopenia 09/2017   T score -2.0 improved from prior study   Premature menopause age 13   following chemotherapy for Lupus   Shingles     Objective:  Physical Exam: Vascular: DP/PT pulses 2/4 bilateral. CFT <3 seconds. Normal hair growth on digits. No edema.  Skin. No lacerations or abrasions bilateral feet. Right hallux lateral border well healed. Tender to the medial border with incurvation noted of the right great toenail.  Musculoskeletal: MMT 5/5 bilateral lower extremities in DF, PF, Inversion and Eversion. Deceased ROM in DF of ankle joint.  Neurological: Sensation intact to light touch.   Assessment:   1. Ingrown toenail of right foot      Plan:  Patient was evaluated and treated and all questions  answered. Patient requesting removal of ingrown nail today. Procedure below.  Discussed procedure and post procedure care and patient expressed understanding.  Will follow-up in 2 weeks for nail check or sooner if any problems arise.    Procedure:  Procedure: partial Nail Avulsion of right hallux medial nail border.  Surgeon: Lorenda Peck, DPM  Pre-op Dx: Ingrown toenail without infection Post-op: Same  Place of Surgery: Office exam room.  Indications for surgery: Painful and ingrown toenail.    The patient is requesting removal of nail with chemical matrixectomy. Risks and complications were discussed with the patient for which they understand and written consent was obtained. Under sterile conditions a total of 3 mL of  1% lidocaine plain was infiltrated in a hallux block fashion. Once anesthetized, the skin was prepped in sterile fashion. A tourniquet was then applied. Next the medial aspect of hallux nail border was then sharply excised making sure to remove the entire offending nail border.  Next phenol was then applied under standard conditions and copiously irrigated. Silvadene was applied. A dry sterile dressing was applied. After application of the dressing the tourniquet was removed and there is found to be an immediate capillary refill time to the digit. The patient tolerated the procedure well without any complications. Post procedure instructions were discussed the patient for which he verbally understood. Follow-up in two weeks for nail check or sooner if any  problems are to arise. Discussed signs/symptoms of infection and directed to call the office immediately should any occur or go directly to the emergency room. In the meantime, encouraged to call the office with any questions, concerns, changes symptoms.   Lorenda Peck, DPM

## 2021-04-25 NOTE — Patient Instructions (Addendum)

## 2021-05-02 DIAGNOSIS — M47817 Spondylosis without myelopathy or radiculopathy, lumbosacral region: Secondary | ICD-10-CM | POA: Diagnosis not present

## 2021-05-02 DIAGNOSIS — G629 Polyneuropathy, unspecified: Secondary | ICD-10-CM | POA: Diagnosis not present

## 2021-05-02 DIAGNOSIS — M25561 Pain in right knee: Secondary | ICD-10-CM | POA: Diagnosis not present

## 2021-05-02 DIAGNOSIS — G894 Chronic pain syndrome: Secondary | ICD-10-CM | POA: Diagnosis not present

## 2021-05-11 ENCOUNTER — Ambulatory Visit: Payer: 59 | Admitting: Podiatry

## 2021-05-18 DIAGNOSIS — Z20822 Contact with and (suspected) exposure to covid-19: Secondary | ICD-10-CM | POA: Diagnosis not present

## 2021-05-26 DIAGNOSIS — S40862A Insect bite (nonvenomous) of left upper arm, initial encounter: Secondary | ICD-10-CM | POA: Diagnosis not present

## 2021-05-26 DIAGNOSIS — W57XXXA Bitten or stung by nonvenomous insect and other nonvenomous arthropods, initial encounter: Secondary | ICD-10-CM | POA: Diagnosis not present

## 2021-06-02 DIAGNOSIS — M542 Cervicalgia: Secondary | ICD-10-CM | POA: Diagnosis not present

## 2021-06-02 DIAGNOSIS — M545 Low back pain, unspecified: Secondary | ICD-10-CM | POA: Diagnosis not present

## 2021-06-02 DIAGNOSIS — M329 Systemic lupus erythematosus, unspecified: Secondary | ICD-10-CM | POA: Diagnosis not present

## 2021-06-02 DIAGNOSIS — Z79891 Long term (current) use of opiate analgesic: Secondary | ICD-10-CM | POA: Diagnosis not present

## 2021-06-08 ENCOUNTER — Encounter: Payer: Self-pay | Admitting: Cardiovascular Disease

## 2021-06-08 ENCOUNTER — Other Ambulatory Visit: Payer: Self-pay

## 2021-06-08 ENCOUNTER — Ambulatory Visit (INDEPENDENT_AMBULATORY_CARE_PROVIDER_SITE_OTHER): Payer: 59 | Admitting: Cardiovascular Disease

## 2021-06-08 DIAGNOSIS — E785 Hyperlipidemia, unspecified: Secondary | ICD-10-CM | POA: Insufficient documentation

## 2021-06-08 DIAGNOSIS — I10 Essential (primary) hypertension: Secondary | ICD-10-CM

## 2021-06-08 DIAGNOSIS — R600 Localized edema: Secondary | ICD-10-CM

## 2021-06-08 DIAGNOSIS — E782 Mixed hyperlipidemia: Secondary | ICD-10-CM

## 2021-06-08 MED ORDER — HYDRALAZINE HCL 25 MG PO TABS
25.0000 mg | ORAL_TABLET | Freq: Two times a day (BID) | ORAL | 3 refills | Status: DC
Start: 2021-06-08 — End: 2022-05-26

## 2021-06-08 NOTE — Patient Instructions (Signed)
Medication Instructions:  ? ?-Stop amlodipine (norvasc). ? ?-Increase hydralazine (apresoline) '25mg'$  twice daily.  ? ? ?*If you need a refill on your cardiac medications before your next appointment, please call your pharmacy* ? ? ?Follow-Up: ?At [facility], you and your health needs are our priority.  As part of our continuing mission to provide you with exceptional heart care, we have created designated Provider Care Teams.  These Care Teams include your primary Cardiologist (physician) and Advanced Practice Providers (APPs -  Physician Assistants and Nurse Practitioners) who all work together to provide you with the care you need, when you need it. ? ?We recommend signing up for the patient portal called "MyChart".  Sign up information is provided on this After Visit Summary.  MyChart is used to connect with patients for Virtual Visits (Telemedicine).  Patients are able to view lab/test results, encounter notes, upcoming appointments, etc.  Non-urgent messages can be sent to your provider as well.   ?To learn more about what you can do with MyChart, go to [email or web address].   ? ?Your next appointment:   ?6 month(s) ? ?The format for your next appointment:   ?In Person ? ?Provider:   ?Sande Rives, PA-C     ? ? ?Then, Quay Burow, MD will plan to see you again in 12 month(s). ? ? ?Other Instructions ?Dr. Gwenlyn Found has requested that you schedule an appointment with one of our clinical pharmacists for a blood pressure check appointment within the next 4 weeks.  ?If you monitor your blood pressure (BP) at home, please bring your BP cuff and your BP readings with you to this appointment ? ?HOW TO TAKE YOUR BLOOD PRESSURE: ?Rest 5 minutes before taking your blood pressure. ?Don?t smoke or drink caffeinated beverages for at least 30 minutes before. ?Take your blood pressure before (not after) you eat. ?Sit comfortably with your back supported and both feet on the floor (don?t cross your legs). ?Elevate your  arm to heart level on a table or a desk. ?Use the proper sized cuff. It should fit smoothly and snugly around your bare upper arm. There should be enough room to slip a fingertip under the cuff. The bottom edge of the cuff should be 1 inch above the crease of the elbow. ?Ideally, take 3 measurements at one sitting and record the average. ? ? ?

## 2021-06-08 NOTE — Assessment & Plan Note (Signed)
History of hyperlipidemia on statin therapy with lipid profile performed 03/24/2021 revealing total cholesterol 135, LDL 59 and HDL of 64. ?

## 2021-06-08 NOTE — Assessment & Plan Note (Signed)
2D echo was normal including diastolic parameters.  She watches her salt intake.  She is on torsemide which helps somewhat.  It is possible that her low-dose amlodipine might be contributing which we will discontinue. ?

## 2021-06-08 NOTE — Progress Notes (Signed)
? ? ? ?06/08/2021 ?Marie Peterson   ?1965/04/08  ?[id number] ? ?Primary Physician [name], MD ?Primary Cardiologist: Lorretta Harp MD [name], [place] ? ?HPI:  Marie Peterson is a 56 y.o.  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 in [place] who is formally a patient of Dr. Lowella Fairy. She currently is out on disability because of SLE. I last saw her in the office 08/08/2019.[name] Kitchen  She has seen Sande Rives, PA-C several times since then.  Her factors include treated hypertension. Her father had bypass surgery at age 12. She's never had a heart attack or stroke. She denies chest pain or shortness of breath. She does have GERD. Since I saw her in the office one year ago she continues to have occasional atypical chest pain. A routine GXT performed 11/12/14 was entirely normal.  ?  ?We obtained a 2D echo 12/28/2016 which was normal and a monitor 01/04/2017 that showed sinus bradycardia with heart rates in the 40s to 60s.  This was performed because of fatigue.  She does get some dyspnea on exertion and I think the majority of her symptoms are related to her rheumatologic disorders including SLE and fibromyalgia which is a recent diagnosis ?  ?Since I last saw her 2 years ago she did have a 2D echo performed 06/09/2020 which was essentially normal as well as a coronary CTA revealed a coronary calcium score of 38 with nonobstructive disease.  She is still dyspneic but denies chest pain.  She still has mild lower extreme edema. ? ? ?Current Meds  ?Medication Sig  ? alendronate (FOSAMAX) 70 MG tablet Take 1 tablet (70 mg total) by mouth once a week. Take with a full glass of water on an empty stomach.  ? aspirin 81 MG tablet Take 81 mg by mouth daily.  ? Atogepant (QULIPTA) 60 MG TABS Take 60 mg by mouth daily.  ?  atorvastatin (LIPITOR) 40 MG tablet Take 1 tablet (40 mg total) by mouth daily.  ? Cholecalciferol (VITAMIN D PO) Take 2,000 Int'l Units by mouth daily.   ? clotrimazole-betamethasone (LOTRISONE) cream Apply 1 application topically 2 (two) times daily.  ? clotrimazole-betamethasone (LOTRISONE) cream Apply 1 application topically 2 (two) times daily.  ? diclofenac sodium (VOLTAREN) 1 % GEL Apply topically.  ? FERREX 150 150 MG capsule Take 1 tablet by mouth daily.  ? folic acid (FOLVITE) 716 MCG tablet Take 400 mcg by mouth daily.  ? HYDROcodone-acetaminophen (NORCO/VICODIN) 5-325 MG tablet Take 1 tablet by mouth every 6 (six) hours as needed for severe pain.  ? hydroxychloroquine (PLAQUENIL) 200 MG tablet Take 200 mg by mouth 2 (two) times daily.   ? L-Methylfolate-B6-B12 (METANX PO) Take 1 tablet by mouth 2 (two) times daily.   ? lidocaine (LIDODERM) 5 % Place 3 patches onto the skin daily. Remove & Discard patch within 12 hours or as directed by MD  ? LORazepam (ATIVAN) 0.5 MG tablet Take 0.5 mg by mouth daily as needed.  ? MAGnesium-Oxide 400 (240 Mg) MG tablet TAKE 1 TABLET(400 MG) BY MOUTH DAILY  ? Multiple Vitamin (MULTIVITAMIN) tablet Take 1 tablet by mouth daily.  ? naratriptan (AMERGE) 2.5 MG tablet Take 1 tablet (2.5 mg total) by mouth as needed for migraine. Take one (1) tablet at onset of headache;  if returns or does not resolve, may repeat after 4 hours; do not exceed five (5) mg in 24 hours.  ? Omega-3 Fatty Acids (FISH OIL) 1000 MG CAPS Take 2 capsules by mouth daily.  ? omeprazole (PRILOSEC) 40 MG capsule Take 1 capsule (40 mg total) by mouth in the morning and at bedtime.  ? ondansetron (ZOFRAN-ODT) 4 MG disintegrating tablet DISSOLVE 1 TABLET ON THE TONGUE EVERY 8 HOURS AS NEEDED FOR NAUSEA OR VOMITING  ? oxyCODONE-acetaminophen (PERCOCET) 7.5-325 MG tablet Take 1 tablet by mouth every 8 (eight) hours.  ? potassium chloride SA (KLOR-CON) 20 MEQ tablet Take 1 tablet (20 mEq total) by mouth 2 (two)  times daily.  ? predniSONE (DELTASONE) 5 MG tablet Take 5 mg by mouth every 3 (three) days. Take one tablet every 3 days  ? pregabalin (LYRICA) 200 MG capsule Take 200 mg by mouth daily.  ? PREVIDENT 5000 PLUS 1.1 % CREA dental cream SMARTSIG:Sparingly By Mouth Twice Daily  ? Rimegepant Sulfate (NURTEC) 75 MG TBDP Take 75 mg by mouth daily as needed (take for abortive therapy of migraine, no more than 1 tablet in 24 hours or 8 per month).  ? sertraline (ZOLOFT) 100 MG tablet sertraline 100 mg tablet ? TAKE 1 TABLET BY MOUTH EVERY DAY  ? sertraline (ZOLOFT) 25 MG tablet Take by mouth with 100 mg tablet for total of 125 mg at bedtime.  ? Suvorexant (BELSOMRA) 20 MG TABS Take 20 mg by mouth Nightly. Take one tablet at bedtime daily  ? tiZANidine (ZANAFLEX) 4 MG tablet 1 tablet as needed  ? tolterodine (DETROL LA) 4 MG 24 hr capsule Take 4 mg by mouth daily.  ? XIIDRA 5 % SOLN Place 1 drop into both eyes 2 (two) times daily.  ? [DISCONTINUED] amLODipine (NORVASC) 2.5 MG tablet Take 1 tablet (2.5 mg total) by mouth every evening AND 2 tablets (5 mg total) every morning.  ? [DISCONTINUED] hydrALAZINE (APRESOLINE) 10 MG tablet TAKE 2 TABLETS(20 MG) BY MOUTH TWICE DAILY AS NEEDED FOR SYSTOLIC BLOOD PRESSURE OVER 150  ?  ? ?Allergies  ?Allergen Reactions  ? Ubrogepant Swelling  ?  Lip swelling   ? Benazepril   ?  Lip swelling  ? Cymbalta [Duloxetine Hcl]   ?  Lip swelling  ? Hygroton [Chlorthalidone] Swelling  ?  Swelling in lips  ? Mirabegron Itching  ?  Other reaction(s): rash  ? Other   ?  Other reaction(s): Unknown  ? Duloxetine Rash  ?  Lip swelling  ? ? ?Social History  ? ?Socioeconomic History  ? Marital status: Married  ?  Spouse name: [name]  ? Number of children: 2  ? Years of education: College  ? Highest education level: Not on file  ?Occupational History  ?  Employer: DISABLED  ?  Comment: disability  ?Tobacco Use  ? Smoking status: Never  ? Smokeless tobacco: Never  ?Vaping Use  ? Vaping Use: Never used   ?Substance and Sexual Activity  ? Alcohol use: No  ?  Alcohol/week: 0.0 standard drinks  ? Drug use: No  ? Sexual activity: Not Currently  ?  Birth control/protection: Surgical, Post-menopausal  ?  Comment: Tubal lig-1st intercourse 56 yo-Fewer than 5 partners  ?Other Topics Concern  ? Not on file  ?Social History Narrative  ? Patient lives at home with her daughter. She is currently separated (not legally).  ? Caffeine Use: 1 cup of coffee daily.   ? Right-handed  ? ?Social Determinants  of Health  ? ?Financial Resource Strain: Not on file  ?Food Insecurity: Not on file  ?Transportation Needs: Not on file  ?Physical Activity: Not on file  ?Stress: Not on file  ?Social Connections: Not on file  ?Intimate Partner Violence: Not on file  ?  ? ?Review of Systems: ?General: negative for chills, fever, night sweats or weight changes.  ?Cardiovascular: negative for chest pain, dyspnea on exertion, edema, orthopnea, palpitations, paroxysmal nocturnal dyspnea or shortness of breath ?Dermatological: negative for rash ?Respiratory: negative for cough or wheezing ?Urologic: negative for hematuria ?Abdominal: negative for nausea, vomiting, diarrhea, bright red blood per rectum, melena, or hematemesis ?Neurologic: negative for visual changes, syncope, or dizziness ?All other systems reviewed and are otherwise negative except as noted above. ? ? ? ?Blood pressure (!) 148/90, pulse (!) 52, height '5\' 4"'$  (1.626 m), weight 218 lb (98.9 kg), SpO2 96 %.  ?General appearance: alert and no distress ?Neck: no adenopathy, no carotid bruit, no JVD, supple, symmetrical, trachea midline, and thyroid not enlarged, symmetric, no tenderness/mass/nodules ?Lungs: clear to auscultation bilaterally ?Heart: regular rate and rhythm, S1, S2 normal, no murmur, click, rub or gallop ?Extremities: extremities normal, atraumatic, no cyanosis or edema ?Pulses: 2+ and symmetric ?Skin: Skin color, texture, turgor normal. No rashes or lesions ?Neurologic:  Grossly normal ? ?EKG sinus bradycardia 52 without ST or T wave changes.  Personally reviewed this EKG. ? ?ASSESSMENT AND PLAN:  ? ?Essential hypertension ?History of essential hypertension a blood pressure measure

## 2021-06-08 NOTE — Assessment & Plan Note (Signed)
History of essential hypertension a blood pressure measured today at 148/90.  She does check her blood pressure at home.  She is on amlodipine, hydralazine.  She does complain of lower extremity edema and is on torsemide.  I am going to stop her amlodipine, increase her hydralazine and have her keep a 30-day blood pressure log.  She will see a Pharm.D. back in 4 weeks to review and make appropriate changes. ?

## 2021-06-14 DIAGNOSIS — Z79899 Other long term (current) drug therapy: Secondary | ICD-10-CM | POA: Diagnosis not present

## 2021-06-14 DIAGNOSIS — M549 Dorsalgia, unspecified: Secondary | ICD-10-CM | POA: Diagnosis not present

## 2021-06-14 DIAGNOSIS — I10 Essential (primary) hypertension: Secondary | ICD-10-CM | POA: Diagnosis not present

## 2021-06-14 DIAGNOSIS — M25559 Pain in unspecified hip: Secondary | ICD-10-CM | POA: Diagnosis not present

## 2021-06-14 DIAGNOSIS — M25551 Pain in right hip: Secondary | ICD-10-CM | POA: Diagnosis not present

## 2021-06-14 DIAGNOSIS — M329 Systemic lupus erythematosus, unspecified: Secondary | ICD-10-CM | POA: Diagnosis not present

## 2021-06-14 DIAGNOSIS — M1990 Unspecified osteoarthritis, unspecified site: Secondary | ICD-10-CM | POA: Diagnosis not present

## 2021-06-14 DIAGNOSIS — R21 Rash and other nonspecific skin eruption: Secondary | ICD-10-CM | POA: Diagnosis not present

## 2021-06-14 DIAGNOSIS — M797 Fibromyalgia: Secondary | ICD-10-CM | POA: Diagnosis not present

## 2021-06-14 DIAGNOSIS — R635 Abnormal weight gain: Secondary | ICD-10-CM | POA: Diagnosis not present

## 2021-06-14 DIAGNOSIS — M810 Age-related osteoporosis without current pathological fracture: Secondary | ICD-10-CM | POA: Diagnosis not present

## 2021-06-16 DIAGNOSIS — Z79899 Other long term (current) drug therapy: Secondary | ICD-10-CM | POA: Diagnosis not present

## 2021-06-16 DIAGNOSIS — H04123 Dry eye syndrome of bilateral lacrimal glands: Secondary | ICD-10-CM | POA: Diagnosis not present

## 2021-06-16 DIAGNOSIS — H524 Presbyopia: Secondary | ICD-10-CM | POA: Diagnosis not present

## 2021-06-16 DIAGNOSIS — M3210 Systemic lupus erythematosus, organ or system involvement unspecified: Secondary | ICD-10-CM | POA: Diagnosis not present

## 2021-06-16 DIAGNOSIS — H25813 Combined forms of age-related cataract, bilateral: Secondary | ICD-10-CM | POA: Diagnosis not present

## 2021-06-19 DIAGNOSIS — Z20822 Contact with and (suspected) exposure to covid-19: Secondary | ICD-10-CM | POA: Diagnosis not present

## 2021-06-28 DIAGNOSIS — Z79899 Other long term (current) drug therapy: Secondary | ICD-10-CM | POA: Diagnosis not present

## 2021-06-28 DIAGNOSIS — M549 Dorsalgia, unspecified: Secondary | ICD-10-CM | POA: Diagnosis not present

## 2021-06-28 DIAGNOSIS — R635 Abnormal weight gain: Secondary | ICD-10-CM | POA: Diagnosis not present

## 2021-06-28 DIAGNOSIS — M25559 Pain in unspecified hip: Secondary | ICD-10-CM | POA: Diagnosis not present

## 2021-06-28 DIAGNOSIS — I10 Essential (primary) hypertension: Secondary | ICD-10-CM | POA: Diagnosis not present

## 2021-06-28 DIAGNOSIS — M797 Fibromyalgia: Secondary | ICD-10-CM | POA: Diagnosis not present

## 2021-06-28 DIAGNOSIS — M1990 Unspecified osteoarthritis, unspecified site: Secondary | ICD-10-CM | POA: Diagnosis not present

## 2021-06-28 DIAGNOSIS — R21 Rash and other nonspecific skin eruption: Secondary | ICD-10-CM | POA: Diagnosis not present

## 2021-06-28 DIAGNOSIS — M329 Systemic lupus erythematosus, unspecified: Secondary | ICD-10-CM | POA: Diagnosis not present

## 2021-06-28 DIAGNOSIS — M810 Age-related osteoporosis without current pathological fracture: Secondary | ICD-10-CM | POA: Diagnosis not present

## 2021-06-29 DIAGNOSIS — G4700 Insomnia, unspecified: Secondary | ICD-10-CM | POA: Diagnosis not present

## 2021-06-29 DIAGNOSIS — F419 Anxiety disorder, unspecified: Secondary | ICD-10-CM | POA: Diagnosis not present

## 2021-06-29 DIAGNOSIS — F331 Major depressive disorder, recurrent, moderate: Secondary | ICD-10-CM | POA: Diagnosis not present

## 2021-06-30 DIAGNOSIS — M329 Systemic lupus erythematosus, unspecified: Secondary | ICD-10-CM | POA: Diagnosis not present

## 2021-06-30 DIAGNOSIS — Z79891 Long term (current) use of opiate analgesic: Secondary | ICD-10-CM | POA: Diagnosis not present

## 2021-06-30 DIAGNOSIS — M542 Cervicalgia: Secondary | ICD-10-CM | POA: Diagnosis not present

## 2021-07-05 ENCOUNTER — Other Ambulatory Visit: Payer: Self-pay

## 2021-07-05 DIAGNOSIS — I739 Peripheral vascular disease, unspecified: Secondary | ICD-10-CM

## 2021-07-05 DIAGNOSIS — R600 Localized edema: Secondary | ICD-10-CM | POA: Diagnosis not present

## 2021-07-05 DIAGNOSIS — I10 Essential (primary) hypertension: Secondary | ICD-10-CM | POA: Diagnosis not present

## 2021-07-11 ENCOUNTER — Ambulatory Visit (INDEPENDENT_AMBULATORY_CARE_PROVIDER_SITE_OTHER): Payer: 59 | Admitting: Pharmacist Clinician (PhC)/ Clinical Pharmacy Specialist

## 2021-07-11 ENCOUNTER — Encounter: Payer: Self-pay | Admitting: Pharmacist Clinician (PhC)/ Clinical Pharmacy Specialist

## 2021-07-11 VITALS — BP 154/92 | HR 63 | Resp 15 | Ht 64.0 in | Wt 220.0 lb

## 2021-07-11 DIAGNOSIS — I10 Essential (primary) hypertension: Secondary | ICD-10-CM

## 2021-07-11 MED ORDER — LOSARTAN POTASSIUM 50 MG PO TABS: 50.0000 mg | ORAL_TABLET | Freq: Every day | ORAL | 3 refills | Status: DC

## 2021-07-11 NOTE — Assessment & Plan Note (Signed)
Patient with essential hypertension, currently not at BP goal.  Her home readings were all WNL for the first few weeks after she saw Dr. Gwenlyn Found, but for the past 10 days have been rather elevated.  She was given losartan 25 mg, however this has had no impact to date.  Will have her increase to 50 mg and continue monitoring.  I suspect some of the increase is due to pain/discomfort related to her current lupus flare.  She has a follow up with her PCP office next week, so will have that office monitor her BP for now.  She was advised that if her pain/discomfort fade and her BP drops, she can decrease the losartan back to 25 mg daily.  We can follow up as needed.  ?

## 2021-07-11 NOTE — Progress Notes (Signed)
? ? ? ?07/11/2021 ?CLEVIE PROUT ?[birth year] ?[id number] ? ? ?HPI:  Marie Peterson is a 56 y.o. female patient of Dr Gwenlyn Found, with a PMH below who presents today for hypertension clinic evaluation.  She was most recently seen by Dr. Gwenlyn Found last month, at which time her BP was noted to be 148/90.  She was on amlodipine and hydralazine at that time, but because of lower extremity edema, he had her discontinue the amlodipine.  She was to monitor BP at home for one month, then return for follow up with CVRR.   ? ?Today she returns to the office.  She has been monitoring her BP daily and recently was seen by the PA at [facility].  At that visit her pressure had jumped to 154/92, and they added losartan 25 mg daily to her hydralazine.  She has not had any problems with this medication, but her BP readings have not decreased.  Interestingly, up until she added that in, her home BP was much lower.  In late March she increased her prednisone to 20 mg daily because of a Lupus flare.  Then last week she was seen by the PA at [place] and losartan 25 mg was given because of elevated BP readings  ? ?Past Medical History: ?hyperlipidemia 12/22 LDL 59 on atorvastatin 40  ?migraines On Qulipta, naratriptan, Nurtec  ?lupus And fibromyalgia; on disability, currently having flare  ?  ? ?Blood Pressure Goal:  130/80 ? ?Current Medications: hydralazine 25 mg bid ? ?Family Hx: both parents with hypertension; father deceased w/ heart disease, prostate cancer; mother living, mostly controlled BP; 12 siblings, 1 deceased; 2 daughters, no hypertension at this time (30/27) ? ?Social Hx: no tobacco, no alcohol, coffee- 1 cup home brewed; no sodas ? ?Diet: mostly home cooked, no added salt; protein is mostly chicken, not fried; vegetables mostly fresh, some frozen ? ?Exercise: no regular exercise ? ?Home BP readings: home cuff around 73-39 years old ? First 18 days averaged 119/76 (prednisone increased on day 5) ? Last 10 days  averaged 153/86 (started losartan on day 2) ? ?Intolerances:  amlodipine - edema; benazepril - lip swelling; chlorthalidone - lip swelling ? ?Labs: 12/22:  Na 143, K 4.4, Glu 87, BUN 10, SCr 0.93, GFR 72 ? ? ?Wt Readings from Last 3 Encounters:  ?07/11/21 220 lb (99.8 kg)  ?06/08/21 218 lb (98.9 kg)  ?04/01/21 221 lb 3.2 oz (100.3 kg)  ? ?BP Readings from Last 3 Encounters:  ?07/11/21 (!) 154/92  ?06/08/21 (!) 148/90  ?04/01/21 112/66  ? ?Pulse Readings from Last 3 Encounters:  ?07/11/21 63  ?06/08/21 (!) 52  ?04/01/21 64  ? ? ?Current Outpatient Medications  ?Medication Sig Dispense Refill  ? alendronate (FOSAMAX) 70 MG tablet Take 1 tablet (70 mg total) by mouth once a week. Take with a full glass of water on an empty stomach. 12 tablet 4  ? aspirin 81 MG tablet Take 81 mg by mouth daily.    ? Atogepant (QULIPTA) 60 MG TABS Take 60 mg by mouth daily. 90 tablet 3  ? atorvastatin (LIPITOR) 40 MG tablet Take 1 tablet (40 mg total) by mouth daily. 90 tablet 3  ? benzonatate (TESSALON) 100 MG capsule Take 100 mg by mouth 3 (three) times daily.    ? Cholecalciferol (VITAMIN D PO) Take 2,000 Int'l Units by mouth daily.     ? clotrimazole-betamethasone (LOTRISONE) cream Apply 1 application topically 2 (two) times daily. 30 g 0  ?  diclofenac sodium (VOLTAREN) 1 % GEL Apply topically.    ? FERREX 150 150 MG capsule Take 1 tablet by mouth daily.  1  ? folic acid (FOLVITE) 962 MCG tablet Take 400 mcg by mouth daily.    ? hydrALAZINE (APRESOLINE) 25 MG tablet Take 1 tablet (25 mg total) by mouth 2 (two) times daily. 180 tablet 3  ? hydroxychloroquine (PLAQUENIL) 200 MG tablet Take 200 mg by mouth 2 (two) times daily.   0  ? L-Methylfolate-B6-B12 (METANX PO) Take 1 tablet by mouth 2 (two) times daily.     ? lidocaine (LIDODERM) 5 % Place 3 patches onto the skin daily. Remove & Discard patch within 12 hours or as directed by MD 90 patch 6  ? LORazepam (ATIVAN) 0.5 MG tablet Take 0.5 mg by mouth daily as needed.    ? losartan  (COZAAR) 50 MG tablet Take 1 tablet (50 mg total) by mouth daily. 90 tablet 3  ? MAGnesium-Oxide 400 (240 Mg) MG tablet TAKE 1 TABLET(400 MG) BY MOUTH DAILY 90 tablet 1  ? Multiple Vitamin (MULTIVITAMIN) tablet Take 1 tablet by mouth daily.    ? nabumetone (RELAFEN) 500 MG tablet Take 500 mg by mouth daily.    ? naratriptan (AMERGE) 2.5 MG tablet Take 1 tablet (2.5 mg total) by mouth as needed for migraine. Take one (1) tablet at onset of headache; if returns or does not resolve, may repeat after 4 hours; do not exceed five (5) mg in 24 hours. 10 tablet 5  ? Omega-3 Fatty Acids (FISH OIL) 1000 MG CAPS Take 2 capsules by mouth daily.    ? omeprazole (PRILOSEC) 40 MG capsule Take 1 capsule (40 mg total) by mouth in the morning and at bedtime. 60 capsule 5  ? ondansetron (ZOFRAN-ODT) 4 MG disintegrating tablet DISSOLVE 1 TABLET ON THE TONGUE EVERY 8 HOURS AS NEEDED FOR NAUSEA OR VOMITING 20 tablet 11  ? oxyCODONE-acetaminophen (PERCOCET) 7.5-325 MG tablet Take 1 tablet by mouth every 8 (eight) hours.    ? potassium chloride SA (KLOR-CON) 20 MEQ tablet Take 1 tablet (20 mEq total) by mouth 2 (two) times daily. 180 tablet 3  ? predniSONE (DELTASONE) 20 MG tablet Take 20 mg by mouth daily with breakfast.    ? pregabalin (LYRICA) 200 MG capsule Take 200 mg by mouth daily.    ? Rimegepant Sulfate (NURTEC) 75 MG TBDP Take 75 mg by mouth daily as needed (take for abortive therapy of migraine, no more than 1 tablet in 24 hours or 8 per month). 8 tablet 11  ? sertraline (ZOLOFT) 100 MG tablet sertraline 100 mg tablet ? TAKE 1 TABLET BY MOUTH EVERY DAY    ? sertraline (ZOLOFT) 25 MG tablet Take by mouth with 100 mg tablet for total of 125 mg at bedtime.    ? solifenacin (VESICARE) 5 MG tablet Take 5 mg by mouth daily.    ? Suvorexant (BELSOMRA) 20 MG TABS Take 20 mg by mouth Nightly. Take one tablet at bedtime daily    ? tiZANidine (ZANAFLEX) 4 MG tablet 1 tablet as needed    ? tolterodine (DETROL LA) 4 MG 24 hr capsule Take 4  mg by mouth daily.    ? torsemide (DEMADEX) 20 MG tablet Take 2 tablets (40 mg total) by mouth 2 (two) times daily. Change in quantity to see if ins will cover. Thank you. 360 tablet 3  ? XIIDRA 5 % SOLN Place 1 drop into both eyes 2 (two) times  daily.    ? ?No current facility-administered medications for this visit.  ? ? ?Allergies  ?Allergen Reactions  ? Ubrogepant Swelling  ?  Lip swelling   ? Benazepril   ?  Lip swelling  ? Cymbalta [Duloxetine Hcl]   ?  Lip swelling  ? Hygroton [Chlorthalidone] Swelling  ?  Swelling in lips  ? Mirabegron Itching  ?  Other reaction(s): rash  ? Other   ?  Other reaction(s): Unknown  ? Duloxetine Rash  ?  Lip swelling  ? ? ?Past Medical History:  ?Diagnosis Date  ? Anxiety   ? Arthritis   ? Atrophic vaginitis   ? Atypical chest pain   ? thought to be GERD; myoview 05/17/07-no significant ischemia; echo 07/20/11- normal EF=>55%  ? Baker's cyst 03/28/2012  ? venous doppler 03/28/12=no thrombus; baker's cyst at left popliteal fossa  ? Chest pain   ? CIN I (cervical intraepithelial neoplasia I) 1990  ? Degenerative disc disease, cervical 2020  ? severe, C3-4, 4-5, 5-6. Currently receiving injections by Dr. Vira Blanco of preferred pain management  ? Depression   ? Fibroid   ? Fibromyalgia   ? Hypertension   ? Lupus nephritis ([place])   ? Migraines   ? Neuropathy   ? Osteopenia 09/2017  ? T score -2.0 improved from prior study  ? Premature menopause age 57  ? following chemotherapy for Lupus  ? Shingles   ? ? ?Blood pressure (!) 154/92, pulse 63, resp. rate 15, height '5\' 4"'$  (1.626 m), weight 220 lb (99.8 kg), SpO2 95 %. ? ?Essential hypertension ?Patient with essential hypertension, currently not at BP goal.  Her home readings were all WNL for the first few weeks after she saw Dr. Gwenlyn Found, but for the past 10 days have been rather elevated.  She was given losartan 25 mg, however this has had no impact to date.  Will have her increase to 50 mg and continue monitoring.  I suspect some of the increase  is due to pain/discomfort related to her current lupus flare.  She has a follow up with her PCP office next week, so will have that office monitor her BP for now.  She was advised that if her pain/discomfort fa

## 2021-07-11 NOTE — Patient Instructions (Signed)
? ?  Go to the lab in 2 weeks to check kidney function.  (Check with [facility] on this) ? ?Check your blood pressure at home daily (if able) and keep record of the readings. ? ?Take your BP meds as follows: ? Increase losartan to 50 mg once daily (take 2 of the 25 mg tablets daily until gone) ? ? If you note your home BP readings are < 100 on multiple occasions, we can decrease back to 25 mg once daily.  If they continue to be low despite cutting back the dose, please reach out to [place].   ? ?Bring all of your meds, your BP cuff and your record of home blood pressures to your next appointment.  Exercise as you?re able, try to walk approximately 30 minutes per day.  Keep salt intake to a minimum, especially watch canned and prepared boxed foods.  Eat more fresh fruits and vegetables and fewer canned items.  Avoid eating in fast food restaurants.  ? ? HOW TO TAKE YOUR BLOOD PRESSURE: ?Rest 5 minutes before taking your blood pressure. ? Don?t smoke or drink caffeinated beverages for at least 30 minutes before. ?Take your blood pressure before (not after) you eat. ?Sit comfortably with your back supported and both feet on the floor (don?t cross your legs). ?Elevate your arm to heart level on a table or a desk. ?Use the proper sized cuff. It should fit smoothly and snugly around your bare upper arm. There should be enough room to slip a fingertip under the cuff. The bottom edge of the cuff should be 1 inch above the crease of the elbow. ?Ideally, take 3 measurements at one sitting and record the average. ? ? ?

## 2021-07-12 ENCOUNTER — Ambulatory Visit
Admission: RE | Admit: 2021-07-12 | Discharge: 2021-07-12 | Disposition: A | Discharge status: Home / Self Care | Payer: 59 | Source: Ambulatory Visit

## 2021-07-12 ENCOUNTER — Telehealth: Payer: Self-pay | Admitting: *Deleted

## 2021-07-12 DIAGNOSIS — R2243 Localized swelling, mass and lump, lower limb, bilateral: Secondary | ICD-10-CM | POA: Diagnosis not present

## 2021-07-12 DIAGNOSIS — I739 Peripheral vascular disease, unspecified: Secondary | ICD-10-CM

## 2021-07-12 DIAGNOSIS — R600 Localized edema: Secondary | ICD-10-CM

## 2021-07-12 NOTE — Telephone Encounter (Signed)
Submitted PA Nurtec on CMM. KeyWaldo Laine - PA Case ID: [id number]. Waiting on determination from [employer]. ?

## 2021-07-12 NOTE — Telephone Encounter (Signed)
Received fax from [employer] that medication on pt list of covered drugs. No PA needed. Pharmacy can call [facility] pharmacy help desk at [phone] if needed. ?

## 2021-07-18 DIAGNOSIS — I10 Essential (primary) hypertension: Secondary | ICD-10-CM | POA: Diagnosis not present

## 2021-07-18 DIAGNOSIS — R600 Localized edema: Secondary | ICD-10-CM | POA: Diagnosis not present

## 2021-07-18 DIAGNOSIS — I998 Other disorder of circulatory system: Secondary | ICD-10-CM | POA: Diagnosis not present

## 2021-07-18 DIAGNOSIS — I739 Peripheral vascular disease, unspecified: Secondary | ICD-10-CM | POA: Diagnosis not present

## 2021-07-22 ENCOUNTER — Other Ambulatory Visit: Payer: Self-pay | Admitting: Cardiovascular Disease

## 2021-07-26 DIAGNOSIS — I10 Essential (primary) hypertension: Secondary | ICD-10-CM | POA: Diagnosis not present

## 2021-07-26 DIAGNOSIS — R635 Abnormal weight gain: Secondary | ICD-10-CM | POA: Diagnosis not present

## 2021-07-26 DIAGNOSIS — M797 Fibromyalgia: Secondary | ICD-10-CM | POA: Diagnosis not present

## 2021-07-26 DIAGNOSIS — M810 Age-related osteoporosis without current pathological fracture: Secondary | ICD-10-CM | POA: Diagnosis not present

## 2021-07-26 DIAGNOSIS — Z79899 Other long term (current) drug therapy: Secondary | ICD-10-CM | POA: Diagnosis not present

## 2021-07-26 DIAGNOSIS — M25559 Pain in unspecified hip: Secondary | ICD-10-CM | POA: Diagnosis not present

## 2021-07-26 DIAGNOSIS — R21 Rash and other nonspecific skin eruption: Secondary | ICD-10-CM | POA: Diagnosis not present

## 2021-07-26 DIAGNOSIS — M329 Systemic lupus erythematosus, unspecified: Secondary | ICD-10-CM | POA: Diagnosis not present

## 2021-07-26 DIAGNOSIS — M549 Dorsalgia, unspecified: Secondary | ICD-10-CM | POA: Diagnosis not present

## 2021-07-26 DIAGNOSIS — M1990 Unspecified osteoarthritis, unspecified site: Secondary | ICD-10-CM | POA: Diagnosis not present

## 2021-07-26 LAB — BASIC METABOLIC PANEL
BUN/Creatinine Ratio: 21 (ref 9–23)
BUN: 26 mg/dL — ABNORMAL HIGH (ref 6–24)
CO2: 31 mmol/L — ABNORMAL HIGH (ref 20–29)
Calcium: 9.3 mg/dL (ref 8.7–10.2)
Chloride: 100 mmol/L (ref 96–106)
Creatinine, Ser: 1.21 mg/dL — ABNORMAL HIGH (ref 0.57–1.00)
Glucose: 98 mg/dL (ref 70–99)
Potassium: 4.2 mmol/L (ref 3.5–5.2)
Sodium: 141 mmol/L (ref 134–144)
eGFR: 53 mL/min/{1.73_m2} — ABNORMAL LOW (ref >59)

## 2021-07-27 ENCOUNTER — Other Ambulatory Visit: Payer: Self-pay | Admitting: *Deleted

## 2021-07-27 DIAGNOSIS — N289 Disorder of kidney and ureter, unspecified: Secondary | ICD-10-CM

## 2021-07-27 DIAGNOSIS — Z79899 Other long term (current) drug therapy: Secondary | ICD-10-CM

## 2021-07-28 DIAGNOSIS — M542 Cervicalgia: Secondary | ICD-10-CM | POA: Diagnosis not present

## 2021-07-28 DIAGNOSIS — M329 Systemic lupus erythematosus, unspecified: Secondary | ICD-10-CM | POA: Diagnosis not present

## 2021-07-28 DIAGNOSIS — M545 Low back pain, unspecified: Secondary | ICD-10-CM | POA: Diagnosis not present

## 2021-08-04 DIAGNOSIS — Z20822 Contact with and (suspected) exposure to covid-19: Secondary | ICD-10-CM | POA: Diagnosis not present

## 2021-08-06 ENCOUNTER — Other Ambulatory Visit: Payer: Self-pay | Admitting: General Practice

## 2021-08-08 ENCOUNTER — Other Ambulatory Visit: Payer: Self-pay | Admitting: Cardiovascular Disease

## 2021-08-17 LAB — BASIC METABOLIC PANEL
BUN/Creatinine Ratio: 21 (ref 9–23)
BUN: 25 mg/dL — ABNORMAL HIGH (ref 6–24)
CO2: 31 mmol/L — ABNORMAL HIGH (ref 20–29)
Calcium: 9 mg/dL (ref 8.7–10.2)
Chloride: 100 mmol/L (ref 96–106)
Creatinine, Ser: 1.17 mg/dL — ABNORMAL HIGH (ref 0.57–1.00)
Glucose: 88 mg/dL (ref 70–99)
Potassium: 3.9 mmol/L (ref 3.5–5.2)
Sodium: 142 mmol/L (ref 134–144)
eGFR: 55 mL/min/{1.73_m2} — ABNORMAL LOW (ref >59)

## 2021-08-19 DIAGNOSIS — I10 Essential (primary) hypertension: Secondary | ICD-10-CM | POA: Diagnosis not present

## 2021-08-19 DIAGNOSIS — R809 Proteinuria, unspecified: Secondary | ICD-10-CM | POA: Diagnosis not present

## 2021-08-19 DIAGNOSIS — Z9221 Personal history of antineoplastic chemotherapy: Secondary | ICD-10-CM | POA: Diagnosis not present

## 2021-08-19 DIAGNOSIS — Z8739 Personal history of other diseases of the musculoskeletal system and connective tissue: Secondary | ICD-10-CM | POA: Diagnosis not present

## 2021-08-25 DIAGNOSIS — Z79899 Other long term (current) drug therapy: Secondary | ICD-10-CM | POA: Diagnosis not present

## 2021-08-25 DIAGNOSIS — M810 Age-related osteoporosis without current pathological fracture: Secondary | ICD-10-CM | POA: Diagnosis not present

## 2021-08-25 DIAGNOSIS — M549 Dorsalgia, unspecified: Secondary | ICD-10-CM | POA: Diagnosis not present

## 2021-08-25 DIAGNOSIS — I10 Essential (primary) hypertension: Secondary | ICD-10-CM | POA: Diagnosis not present

## 2021-08-25 DIAGNOSIS — L52 Erythema nodosum: Secondary | ICD-10-CM | POA: Diagnosis not present

## 2021-08-25 DIAGNOSIS — R635 Abnormal weight gain: Secondary | ICD-10-CM | POA: Diagnosis not present

## 2021-08-25 DIAGNOSIS — M329 Systemic lupus erythematosus, unspecified: Secondary | ICD-10-CM | POA: Diagnosis not present

## 2021-08-25 DIAGNOSIS — M797 Fibromyalgia: Secondary | ICD-10-CM | POA: Diagnosis not present

## 2021-08-25 DIAGNOSIS — M1990 Unspecified osteoarthritis, unspecified site: Secondary | ICD-10-CM | POA: Diagnosis not present

## 2021-08-30 DIAGNOSIS — Z79891 Long term (current) use of opiate analgesic: Secondary | ICD-10-CM | POA: Diagnosis not present

## 2021-08-30 DIAGNOSIS — M545 Low back pain, unspecified: Secondary | ICD-10-CM | POA: Diagnosis not present

## 2021-08-30 DIAGNOSIS — M47816 Spondylosis without myelopathy or radiculopathy, lumbar region: Secondary | ICD-10-CM | POA: Diagnosis not present

## 2021-09-13 DIAGNOSIS — M47816 Spondylosis without myelopathy or radiculopathy, lumbar region: Secondary | ICD-10-CM | POA: Diagnosis not present

## 2021-09-16 DIAGNOSIS — L930 Discoid lupus erythematosus: Secondary | ICD-10-CM | POA: Diagnosis not present

## 2021-09-16 DIAGNOSIS — D1723 Benign lipomatous neoplasm of skin and subcutaneous tissue of right leg: Secondary | ICD-10-CM | POA: Diagnosis not present

## 2021-09-20 ENCOUNTER — Other Ambulatory Visit: Payer: Self-pay

## 2021-09-20 MED ORDER — QULIPTA 60 MG PO TABS: 60.0000 mg | ORAL_TABLET | Freq: Every day | ORAL | 3 refills | Status: DC

## 2021-09-22 ENCOUNTER — Other Ambulatory Visit: Payer: Self-pay

## 2021-09-22 MED ORDER — ATORVASTATIN CALCIUM 40 MG PO TABS: 40.0000 mg | ORAL_TABLET | Freq: Every day | ORAL | 3 refills | Status: DC

## 2021-09-27 ENCOUNTER — Other Ambulatory Visit: Payer: Self-pay | Admitting: Nurse Practitioner

## 2021-09-27 DIAGNOSIS — M8588 Other specified disorders of bone density and structure, other site: Secondary | ICD-10-CM

## 2021-09-28 DIAGNOSIS — M47816 Spondylosis without myelopathy or radiculopathy, lumbar region: Secondary | ICD-10-CM | POA: Diagnosis not present

## 2021-09-28 NOTE — Telephone Encounter (Signed)
Last annual exam 09/2020 Scheduled on 10/24/21

## 2021-10-14 ENCOUNTER — Ambulatory Visit (INDEPENDENT_AMBULATORY_CARE_PROVIDER_SITE_OTHER): Payer: 59

## 2021-10-14 ENCOUNTER — Encounter: Payer: Self-pay | Admitting: Podiatry

## 2021-10-14 ENCOUNTER — Ambulatory Visit (INDEPENDENT_AMBULATORY_CARE_PROVIDER_SITE_OTHER): Payer: 59 | Admitting: Podiatry

## 2021-10-14 DIAGNOSIS — S9032XA Contusion of left foot, initial encounter: Secondary | ICD-10-CM

## 2021-10-14 DIAGNOSIS — M79672 Pain in left foot: Secondary | ICD-10-CM | POA: Diagnosis not present

## 2021-10-14 NOTE — Progress Notes (Signed)
  Subjective:  Patient ID: Marie Peterson, female    DOB: [birth year],   MRN: [id number]  Chief Complaint  Patient presents with   Foot Pain    L TOP OF FOOT - SWOLLEN, RED & PAINFUL - INJURED BY FALL    56 y.o. female presents for new concern of top of left foot pain that occurred after a fall. Relates she fell in her house on Wednesday. She has not tried any treatments. Relates top of the foot hurts with swelling present. She is not diabetic.  Denies any other pedal complaints. Denies n/v/f/c.   Past Medical History:  Diagnosis Date   Anxiety    Arthritis    Atrophic vaginitis    Atypical chest pain    thought to be GERD; myoview 05/17/07-no significant ischemia; echo 07/20/11- normal EF=>55%   Baker's cyst 03/28/2012   venous doppler 03/28/12=no thrombus; baker's cyst at left popliteal fossa   Chest pain    CIN I (cervical intraepithelial neoplasia I) 1990   Degenerative disc disease, cervical 2020   severe, C3-4, 4-5, 5-6. Currently receiving injections by Dr. Vira Blanco of preferred pain management   Depression    Fibroid    Fibromyalgia    Hypertension    Lupus nephritis ([place])    Migraines    Neuropathy    Osteopenia 09/2017   T score -2.0 improved from prior study   Premature menopause age 73   following chemotherapy for Lupus   Shingles     Objective:  Physical Exam: Vascular: DP/PT pulses 2/4 bilateral. CFT <3 seconds. Normal hair growth on digits. No edema.  Skin. No lacerations or abrasions bilateral feet.  Musculoskeletal: MMT 5/5 bilateral lower extremities in DF, PF, Inversion and Eversion. Deceased ROM in DF of ankle joint. Tender over the whole dorsum of the foot particularly in the area of the first and second metatarsals.  Neurological: Sensation intact to light touch.   Assessment:   1. Left foot pain      Plan:3  Patient was evaluated and treated and all questions answered. -Xrays reviewed. No acute fractures or dislocations noted. Noted healed fracture of  left second proximal phalanx healed.  -Discussed treatement options for toe fracture; risks, alternatives, and benefits explained. -Dispensed surgical shoe. Patient to wear at all times and instructed on use -Recommend protection, rest, ice, elevation daily until symptoms improve -Rx pain med/antinflammatories as needed -Patient to return to office in 4 weeks for serial x-rays to assess healing  or sooner if condition worsens.   Lorenda Peck, DPM

## 2021-10-17 DIAGNOSIS — Z79891 Long term (current) use of opiate analgesic: Secondary | ICD-10-CM | POA: Diagnosis not present

## 2021-10-17 DIAGNOSIS — M329 Systemic lupus erythematosus, unspecified: Secondary | ICD-10-CM | POA: Diagnosis not present

## 2021-10-17 DIAGNOSIS — M47817 Spondylosis without myelopathy or radiculopathy, lumbosacral region: Secondary | ICD-10-CM | POA: Diagnosis not present

## 2021-10-21 NOTE — Progress Notes (Unsigned)
RAKEL JUNIO 07/06/1965 [id number]  History:  56 y.o. G2P2002 presents for annual exam. Postmenopausal - no HRT, no bleeding. Mild menopausal symptoms. 1990 CIN-1, subsequent paps normal. On Fosamax since 2017 for worsening osteopenia and prednisone use for SLE. History of SLE, HTN, uterine fibroids. Complains of intermittent redness and itchiness under breasts.   Gynecologic History No LMP recorded. Patient is postmenopausal.   Contraception/Family planning: post menopausal status Sexually active: No  Health Maintenance Last Pap: 10/20/2020. Results were: Normal, 3-year repeat Last mammogram: 03/24/2021. Results were: Normal Last colonoscopy: 2017. Results were: Normal, 10-year recall Last Dexa: 11/04/2019. Results were: T-score -1.8 of spine  Past medical history, past surgical history, family history and social history were all reviewed and documented in the EPIC chart. Recently separated. 2 daughters ages 16 (works for [employer] home improvement) and 30 (working remote for [employer] doing audits), both living in [place].  ROS:  A ROS was performed and pertinent positives and negatives are included.  Exam:  Vitals:   10/24/21 0826  BP: 104/70  Pulse: (!) 55  SpO2: 97%  Weight: 228 lb (103.4 kg)  Height: 5' 3.75" (1.619 m)    Body mass index is 39.44 kg/m.  General appearance:  Normal Thyroid:  Symmetrical, normal in size, without palpable masses or nodularity. Respiratory  Auscultation:  Clear without wheezing or rhonchi Cardiovascular  Auscultation:  Regular rate, without rubs, murmurs or gallops  Edema/varicosities:  Not grossly evident Abdominal  Soft,nontender, without masses, guarding or rebound.  Liver/spleen:  No organomegaly noted  Hernia:  None appreciated  Skin  Inspection:  Grossly normal Breasts: Examined lying and sitting.   Right: Without masses, retractions, nipple discharge or axillary adenopathy.   Left: Without masses, retractions, nipple  discharge or axillary adenopathy. Genitourinary   Inguinal/mons:  Normal without inguinal adenopathy  External genitalia:  Normal appearing vulva with no masses, tenderness, or lesions  BUS/Urethra/Skene's glands:  Normal  Vagina:  Normal appearing with normal color and discharge, no lesions. Atrophic changes  Cervix:  Normal appearing without discharge or lesions  Uterus:  Normal in size, shape and contour.  Midline and mobile, nontender  Adnexa/parametria:     Rt: Normal in size, without masses or tenderness.   Lt: Normal in size, without masses or tenderness.  Anus and perineum: Normal  Digital rectal exam: Normal sphincter tone without palpated masses or tenderness  Patient informed chaperone available to be present for breast and pelvic exam. Patient has requested no chaperone to be present. Patient has been advised what will be completed during breast and pelvic exam.   Assessment/Plan:  56 y.o. G2P2002 for annual exam.   Well female exam with routine gynecological exam - Education provided on SBEs, importance of preventative screenings, current guidelines, high calcium diet, regular exercise, and multivitamin daily. Labs with PCP.   Postmenopausal - no HRT, no bleeding. Occasional hot flashes.   Age-related osteoporosis without current pathological fracture - Plan: DG Bone Density. Plan: alendronate (FOSAMAX) 70 MG tablet weekly. Most recent T-score -1.8 of spine 10/2019. Started on Fosamax in 2017. Will repeat DXA now and consider switching to Prolia. She is agreeable. She does have SLE and uses prednisone often.   Skin yeast infection - Plan: nystatin (MYCOSTATIN/NYSTOP) powder BID as needed under breasts. Keep area clean and dry.   Screening for cervical cancer - 1990 CIN-1, subsequent paps normal. Will repeat at 3-year interval per guidelines.   Screening for breast cancer - Normal mammogram history.  Continue annual screenings.  Normal breast exam today.  Screening for colon  cancer - 2017 colonoscopy. Will repeat at 10-year interval per GI's recommendation.   Return in 1 year for annual.     Tamela Gammon DNP, 8:51 AM 10/24/2021

## 2021-10-24 ENCOUNTER — Encounter: Payer: Self-pay | Admitting: Nurse Practitioner

## 2021-10-24 ENCOUNTER — Ambulatory Visit (INDEPENDENT_AMBULATORY_CARE_PROVIDER_SITE_OTHER): Payer: 59 | Admitting: Nurse Practitioner

## 2021-10-24 VITALS — BP 104/70 | HR 55 | Ht 63.75 in | Wt 228.0 lb

## 2021-10-24 DIAGNOSIS — M8588 Other specified disorders of bone density and structure, other site: Secondary | ICD-10-CM

## 2021-10-24 DIAGNOSIS — Z01419 Encounter for gynecological examination (general) (routine) without abnormal findings: Secondary | ICD-10-CM

## 2021-10-24 DIAGNOSIS — M810 Age-related osteoporosis without current pathological fracture: Secondary | ICD-10-CM

## 2021-10-24 DIAGNOSIS — Z780 Asymptomatic menopausal state: Secondary | ICD-10-CM | POA: Diagnosis not present

## 2021-10-24 DIAGNOSIS — F419 Anxiety disorder, unspecified: Secondary | ICD-10-CM | POA: Diagnosis not present

## 2021-10-24 DIAGNOSIS — G4700 Insomnia, unspecified: Secondary | ICD-10-CM | POA: Diagnosis not present

## 2021-10-24 DIAGNOSIS — B372 Candidiasis of skin and nail: Secondary | ICD-10-CM

## 2021-10-24 DIAGNOSIS — F331 Major depressive disorder, recurrent, moderate: Secondary | ICD-10-CM | POA: Diagnosis not present

## 2021-10-24 MED ORDER — ALENDRONATE SODIUM 70 MG PO TABS
ORAL_TABLET | ORAL | 0 refills | Status: DC
Start: 2021-10-24 — End: 2023-06-19

## 2021-10-24 MED ORDER — NYSTATIN 100000 UNIT/GM EX POWD: 1.0000 | Freq: Two times a day (BID) | CUTANEOUS | 1 refills | Status: DC

## 2021-11-02 DIAGNOSIS — I10 Essential (primary) hypertension: Secondary | ICD-10-CM | POA: Diagnosis not present

## 2021-11-02 DIAGNOSIS — M1990 Unspecified osteoarthritis, unspecified site: Secondary | ICD-10-CM | POA: Diagnosis not present

## 2021-11-02 DIAGNOSIS — M329 Systemic lupus erythematosus, unspecified: Secondary | ICD-10-CM | POA: Diagnosis not present

## 2021-11-02 DIAGNOSIS — Z79899 Other long term (current) drug therapy: Secondary | ICD-10-CM | POA: Diagnosis not present

## 2021-11-02 DIAGNOSIS — M797 Fibromyalgia: Secondary | ICD-10-CM | POA: Diagnosis not present

## 2021-11-02 DIAGNOSIS — M810 Age-related osteoporosis without current pathological fracture: Secondary | ICD-10-CM | POA: Diagnosis not present

## 2021-11-02 DIAGNOSIS — R635 Abnormal weight gain: Secondary | ICD-10-CM | POA: Diagnosis not present

## 2021-11-02 DIAGNOSIS — M549 Dorsalgia, unspecified: Secondary | ICD-10-CM | POA: Diagnosis not present

## 2021-11-02 DIAGNOSIS — L739 Follicular disorder, unspecified: Secondary | ICD-10-CM | POA: Diagnosis not present

## 2021-11-05 ENCOUNTER — Other Ambulatory Visit: Payer: Self-pay | Admitting: General Practice

## 2021-11-08 ENCOUNTER — Ambulatory Visit (INDEPENDENT_AMBULATORY_CARE_PROVIDER_SITE_OTHER): Payer: 59

## 2021-11-08 ENCOUNTER — Other Ambulatory Visit: Payer: Self-pay | Admitting: Nurse Practitioner

## 2021-11-08 DIAGNOSIS — M810 Age-related osteoporosis without current pathological fracture: Secondary | ICD-10-CM

## 2021-11-08 DIAGNOSIS — Z780 Asymptomatic menopausal state: Secondary | ICD-10-CM

## 2021-11-08 DIAGNOSIS — M8589 Other specified disorders of bone density and structure, multiple sites: Secondary | ICD-10-CM

## 2021-11-08 DIAGNOSIS — Z13820 Encounter for screening for osteoporosis: Secondary | ICD-10-CM

## 2021-11-11 ENCOUNTER — Ambulatory Visit (INDEPENDENT_AMBULATORY_CARE_PROVIDER_SITE_OTHER): Payer: 59 | Admitting: Podiatry

## 2021-11-11 ENCOUNTER — Encounter: Payer: Self-pay | Admitting: Podiatry

## 2021-11-11 DIAGNOSIS — S9032XD Contusion of left foot, subsequent encounter: Secondary | ICD-10-CM | POA: Diagnosis not present

## 2021-11-11 NOTE — Progress Notes (Signed)
  Subjective:  Patient ID: Marie Peterson, female    DOB: Jan 22, 1966,   MRN: [id number]  Chief Complaint  Patient presents with   Foot Pain    Left foot pain , no better since the last visit     56 y.o. female presents for follow-up of left foot injury. Relates the pain has been improving but still does have some pain and swelling.  Most of the swelling and tenderness on the top of the foot.  She is not diabetic.  Denies any other pedal complaints. Denies n/v/f/c.   Past Medical History:  Diagnosis Date   Anxiety    Arthritis    Atrophic vaginitis    Atypical chest pain    thought to be GERD; myoview 05/17/07-no significant ischemia; echo 07/20/11- normal EF=>55%   Baker's cyst 03/28/2012   venous doppler 03/28/12=no thrombus; baker's cyst at left popliteal fossa   Chest pain    CIN I (cervical intraepithelial neoplasia I) 1990   Degenerative disc disease, cervical 2020   severe, C3-4, 4-5, 5-6. Currently receiving injections by Dr. Vira Blanco of preferred pain management   Depression    Fibroid    Fibromyalgia    Hypertension    Lupus nephritis ([place])    Migraines    Neuropathy    Osteopenia 09/2017   T score -2.0 improved from prior study   Premature menopause age 68   following chemotherapy for Lupus   Shingles     Objective:  Physical Exam: Vascular: DP/PT pulses 2/4 bilateral. CFT <3 seconds. Normal hair growth on digits. No edema.  Skin. No lacerations or abrasions bilateral feet.  Musculoskeletal: MMT 5/5 bilateral lower extremities in DF, PF, Inversion and Eversion. Deceased ROM in DF of ankle joint. Mildly tender over the whole dorsum of the foot particularly in the area of the first and second metatarsals.  Neurological: Sensation intact to light touch.   Assessment:   1. Contusion of left foot, subsequent encounter       Plan:3  Patient was evaluated and treated and all questions answered. -Xrays reviewed. No acute fractures or dislocations noted. Noted healed  fracture of left second proximal phalanx healed.  -Discussed treatement options for contusion of foot.  risks, alternatives, and benefits explained. Discussed this should continue to get better.  -May start WBAT in regular shoe and see how this goes.  -Recommend protection, rest, ice, elevation daily until symptoms improve. Recommend compression for swelling.  -Rx pain med/antinflammatories as needed -Patient to return to office as needed.    Lorenda Peck, DPM

## 2021-11-14 DIAGNOSIS — Z79891 Long term (current) use of opiate analgesic: Secondary | ICD-10-CM | POA: Diagnosis not present

## 2021-11-14 DIAGNOSIS — M47817 Spondylosis without myelopathy or radiculopathy, lumbosacral region: Secondary | ICD-10-CM | POA: Diagnosis not present

## 2021-11-14 DIAGNOSIS — M545 Low back pain, unspecified: Secondary | ICD-10-CM | POA: Diagnosis not present

## 2021-11-23 ENCOUNTER — Telehealth: Payer: Self-pay | Admitting: *Deleted

## 2021-11-23 ENCOUNTER — Ambulatory Visit (INDEPENDENT_AMBULATORY_CARE_PROVIDER_SITE_OTHER): Payer: 59 | Admitting: Nurse Practitioner

## 2021-11-23 VITALS — BP 118/74 | HR 58

## 2021-11-23 DIAGNOSIS — M810 Age-related osteoporosis without current pathological fracture: Secondary | ICD-10-CM | POA: Diagnosis not present

## 2021-11-23 NOTE — Progress Notes (Signed)
   Acute Office Visit  Subjective:    Patient ID: Marie Peterson, female    DOB: Sep 21, 1965, 56 y.o.   MRN: [id number]   HPI 56 y.o. presents today to discuss DXA results and plan of care. 11/08/2021 T-score -2.1 at spine (-1.8 in 2021). On Fosamax since 2017. Taking Vitamin D 2000 IU, calcium 1000 mg.    Review of Systems  Constitutional: Negative.        Objective:    Physical Exam Constitutional:      Appearance: Normal appearance.     BP 118/74   Pulse (!) 58   SpO2 96%  Wt Readings from Last 3 Encounters:  10/24/21 228 lb (103.4 kg)  07/11/21 220 lb (99.8 kg)  06/08/21 218 lb (98.9 kg)        Assessment & Plan:   Problem List Items Addressed This Visit   None Visit Diagnoses     Age-related osteoporosis without current pathological fracture    -  Primary   Relevant Orders   Creatinine   Calcium      Plan: Reviewed DXA report. Recommend stopping Fosamax and switching to Prolia for better management. Discussed MOA of Prolia, potential risks and side effects, and benefits of use. Will continue Fosamax until Prolia provided. Will check baseline creatinine and calcium levels. Continue calcium and Vitamin D supplementation. Home safety and fall prevention reviewed. All questions answered.      Tamela Gammon DNP, 10:51 AM 11/23/2021

## 2021-11-23 NOTE — Telephone Encounter (Signed)
-----   Message from Tamela Gammon, NP sent at 11/23/2021 10:51 AM EDT ----- Regarding: Prolia Please start Prolia process. Baseline creatinine and Ca++ collected today. Thanks.

## 2021-11-23 NOTE — Telephone Encounter (Signed)
Prolia insurance verification has been sent awaiting Summary of benefits  

## 2021-11-24 LAB — CALCIUM: Calcium: 9 mg/dL (ref 8.6–10.4)

## 2021-11-24 LAB — CREATININE, SERUM: Creat: 1 mg/dL (ref 0.50–1.03)

## 2021-12-02 NOTE — Telephone Encounter (Addendum)
Dual coverage    Annual exam 10/06/2021  Calcium   9.0          Date 11/23/2021  Upcoming dental procedures NO  Prior Authorization approved scanned in epic Auth # [id number] Valid 12/02/2021-12/03/2022  Pt estimated Cost $0     Appt 12/05/2021

## 2021-12-05 ENCOUNTER — Ambulatory Visit (INDEPENDENT_AMBULATORY_CARE_PROVIDER_SITE_OTHER): Payer: 59

## 2021-12-05 DIAGNOSIS — M810 Age-related osteoporosis without current pathological fracture: Secondary | ICD-10-CM | POA: Diagnosis not present

## 2021-12-05 MED ORDER — DENOSUMAB 60 MG/ML ~~LOC~~ SOSY
60.0000 mg | PREFILLED_SYRINGE | Freq: Once | SUBCUTANEOUS | Status: AC
  Administered 2021-12-05: 60 mg via SUBCUTANEOUS

## 2021-12-12 DIAGNOSIS — M47817 Spondylosis without myelopathy or radiculopathy, lumbosacral region: Secondary | ICD-10-CM | POA: Diagnosis not present

## 2021-12-12 DIAGNOSIS — M545 Low back pain, unspecified: Secondary | ICD-10-CM | POA: Diagnosis not present

## 2021-12-12 DIAGNOSIS — Z79891 Long term (current) use of opiate analgesic: Secondary | ICD-10-CM | POA: Diagnosis not present

## 2022-01-02 ENCOUNTER — Encounter: Payer: Self-pay | Admitting: Family Medicine

## 2022-01-02 ENCOUNTER — Ambulatory Visit (INDEPENDENT_AMBULATORY_CARE_PROVIDER_SITE_OTHER): Payer: 59 | Admitting: Family Medicine

## 2022-01-02 VITALS — BP 142/71 | HR 50 | Ht 64.0 in | Wt 223.0 lb

## 2022-01-02 DIAGNOSIS — G43009 Migraine without aura, not intractable, without status migrainosus: Secondary | ICD-10-CM

## 2022-01-02 MED ORDER — NURTEC 75 MG PO TBDP: 75.0000 mg | ORAL_TABLET | Freq: Every day | ORAL | 11 refills | Status: DC | PRN

## 2022-01-02 MED ORDER — NARATRIPTAN HCL 2.5 MG PO TABS: 2.5000 mg | ORAL_TABLET | ORAL | 11 refills | Status: DC | PRN

## 2022-01-02 NOTE — Patient Instructions (Signed)
Below is our plan:  We will continue qulipta '60mg'$  daily and either Nurtec or Amerge as needed. Please monitor for symptoms of bradycardia as discussed.   Please make sure you are staying well hydrated. I recommend 50-60 ounces daily. Well balanced diet and regular exercise encouraged. Consistent sleep schedule with 6-8 hours recommended.   Please continue follow up with care team as directed.   Follow up with me in 1 year  You may receive a survey regarding today's visit. I encourage you to leave honest feed back as I do use this information to improve patient care. Thank you for seeing me today!

## 2022-01-02 NOTE — Progress Notes (Signed)
Chief Complaint  Patient presents with   Follow-up    Rm 2, pt alone, pt states migraines and headaches for the most part are well controlled with the Qulipta and nurtec/naratriptan as abortive. The naratriptan is her first go to.     HISTORY OF PRESENT ILLNESS:  01/02/22 ALL:  Marie Peterson returns for migraine follow up. She continues Statistician. Nurtec used for intractable headaches. She reports doing well. She may have 4-6 migraine days a month. Abortive meds work well. She is followed by PCP regularly. She has multiple specialists as well. She denies history of bradycardia but there is history in chart. She was recently started on Belbuca for pain management. She denies palpitations, dizziness or lightheadedness. She is followed by cardiology for HTN.   01/19/2022 ALL: Marie Peterson returns for follow up for migraines. We started her on Qulipta due to breakthrough migraines on Ajovy. She does feel that Nurtec and naratriptan work well. She may have 8-12 headache days a month but only 2-3 are severe. She takes either Nurtec or naratriptan and usually migraine is easily aborted. She continues to see PCP and psychiatry regularly.   07/19/2020 ALL:  She returns for follow up for migraines. She continues Ajovy injections. She feels that these have helped significantly. She does feel that Ajovy wears off towards the end of the cycle (getting 3inj/7mhs). She has about 5-6 migraines a month. Amerge works well for abortive therapy. Gabapentin was switched Lyrica. She is now taking '200mg'$  BID. Insurance no longer covers Ajovy. She is requesting to switch to oral CGRP due to fears of injections.   01/15/2020 ALL:  Marie BUSEYis a 56y.o. female here today for follow up for migraines. Sleep eval was normal. She continues  Ajovy and gabapentin 600/600/1200. She was started on Effexor by psychiatry but had an allergic reaction. She is now taking sertraline. She continues Nurtec for abortive therapy and feels  that it helps with pain but does not abort migraine. She continues to have daily tension headaches. She has about 8 migraines per month. She admits stress is contributor. She is followed closely by Marie Peterson She has chronic pain on oxycodone TID.   HISTORY (copied from Marie DEdwena Feltynote on 09/11/2019)  Marie FONGis a 56 year-old [place] female patient established with Marie. AJaynee Eaglesand seen here upon a referral on 09/11/2019 from Marie KMaudie Mercuryfor sleep disordered breathing. Chief concern according to patient :   Marie Peterson that her daughter has slept for a while at her house and noticed her to twitch and move during her sleep may be more of a trembling.  She also noted irregular breathing and pauses in her sleep breathing.  For this reason she is referred for evaluation of possible sleep apnea.  Marie. JJani Gravelreferred.   I reviewed the patient's medication she is on Percocet, she is still on oxycodone.   Prednisone, amlodipine Benzapril, alendronate, omeprazole, Tessalon Perles as needed furosemide 40 mg actually 1.5 tablets in the morning and 1 in the night, hydralazine twice a day, no tach, Belsomra 20 mg Trintellix 10 mg not also on gabapentin 3 times daily. A lot of medications with sleep inducing or sleep changing potential.   Marie Peterson a right -handed Black or [place] female with a possible sleep disorder.  She has a past medical history of Lupus, Arthritis, Atrophic vaginitis, Atypical chest pain, Baker's cyst (03/28/12), Chest pain, CIN I (  cervical intraepithelial neoplasia I) (1990), Degenerative disc disease, cervical (2020), Fibroid, "Fibromyalgia", Hypertension, Lupus nephritis ([place]), Migraines, Neuropathy, Osteopenia (09/2017), Premature menopause (age 26), and Shingles.    Sleep relevant medical history: Nocturia- diuretic induced 3-4 times , No Sleep walking, sinus surgery    Family medical /sleep history: niece is another family member on CPAP with  [facility].   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 Family status is separated. Tobacco use- none .  ETOH use ;none , Caffeine intake in form of Coffee( 1 cup daily  Soda( none) Tea ( rare ) nor energy drinks. Regular exercise - none .   Sleep habits are as follows: The patient's dinner time is between 6 PM. The patient goes to bed at variable times , 10-11 PM and struggles to sleep-once asleep continues to sleep for 1-3 hours, wakes for many bathroom breaks.   The preferred sleep position is supine of on her side, with the support of 2 pillows. She sleeps with a boot, plantar fasciitis.   Dreams are reportedly frequent.   8.30 AM is the usual rise time. The patient wakes up spontaneously between 4.30 and 5 AM , but tries to get more sleep.   She reports not feeling refreshed or restored in AM, with symptoms such as dry mouth , morning headaches , and residual fatigue. Naps are taken frequently, lasting from 1-2 hours , feels these are less refreshing than nocturnal sleep. She reportedly yawns al through the day.    "Addendum Marie Peterson have followed for Migraine and here is the last note from 03-11-2019: Marie Peterson is a 56 y.o. female here today for follow up. She is under more stress recently with her dad's new diagnosis of prostate and brain cancer. She is one of 12 children and feels that they all look to her for support. She has noticed headaches are worse. Same headache but more frequent. She continues to do well with Ajovy every three months, gabapentin '600mg'$  three times daily and Nurtec for abortive therapy. BP has been elevated. She is currently separated from her husband. She is followed by Marie Peterson. Effexor was recently switched to Trintellix. She feels that Trintellix is making her sick on her stomach. She is followed closely by  PCP as well. She is taking hydralazine, amlodipine and benazepril for HTN. She is  on chronic opioids for pain management. "  History (copied from my note on 03/11/2019)  Marie RUPERT is a 56 y.o. female here today for follow up. She is under more stress recently with her dad's new diagnosis of prostate and brain cancer. She is one of 12 children and feels that they all look to her for support. She has noticed headaches are worse. Same headache but more frequent. She continues to do well with Ajovy every three months, gabapentin '600mg'$  three times daily and Nurtec for abortive therapy. BP has been elevated. She is currently separated from her husband. She is followed by Marie Peterson. Effexor was recently switched to Trintellix. She feels that Trintellix is making her sick on her stomach. She is followed closely by  PCP as well. She is taking hydralazine, amlodipine and benazepril for HTN. She is on chronic opioids for pain management.    HISTORY: (copied from my note on 10/31/2018)   MAKYLEE SANBORN is a 56 y.o. female here today for follow up. She has had an  increase in migraines. She is having daily headaches. She reports at least 2-3 migrainous headaches each week with light/sound sensitivity and nausea. Heat and stress make headaches worse. She is going through a separation currently and has noticed migraines are worse over the past month.  She is using Ajovy injections (3injections every three months). She is taking gabapentin '600mg'$  TID for neuropathy. She is taking Percocet 7.5/'325mg'$  for severe DDD prescribed by PCP. She is also seeing rheumatology for Lupus.  She uses Zomig for abortive therapy that does ease up the headache. She occasionally takes Zofran for nausea with migraine.    She is taking Belsomra for sleep. She does not feel it is helping her. She continues to wake frequently throughout the night. She does not snore. She is admittedly more depressed. She has not spoken with PCP due to him being out of the office for medical leave. She is not taking any antidepressants at this  time. She does not remember being on anything in the past with the exception of Cymbalta for pain. This medications did cause mild lip swelling. No respiratory involvement , no swollen tongue or airway. No trouble breathing.    REVIEW OF SYSTEMS: Out of a complete 14 system review of symptoms, the patient complains only of the following symptoms, headaches, chronic pain, anxiety and depression and all other reviewed systems are negative.   ALLERGIES: Allergies  Allergen Reactions   Ubrogepant Swelling    Lip swelling    Benazepril     Lip swelling   Cymbalta [Duloxetine Hcl]     Lip swelling   Hygroton [Chlorthalidone] Swelling    Swelling in lips   Mirabegron Itching    Other reaction(s): rash   Other     Other reaction(s): Unknown   Duloxetine Rash    Lip swelling     HOME MEDICATIONS: Outpatient Medications Prior to Visit  Medication Sig Dispense Refill   alendronate (FOSAMAX) 70 MG tablet TAKE 1 TABLET(70 MG) BY MOUTH 1 TIME A WEEK WITH A FULL GLASS OF WATER AND ON AN EMPTY STOMACH 4 tablet 0   aspirin 81 MG tablet Take 81 mg by mouth daily.     Atogepant (QULIPTA) 60 MG TABS Take 60 mg by mouth daily. 90 tablet 3   atorvastatin (LIPITOR) 40 MG tablet Take 1 tablet (40 mg total) by mouth daily. 90 tablet 3   Buprenorphine HCl (BELBUCA) 450 MCG FILM 1 film Bucally every 12 hrs for 30 days     Cholecalciferol (VITAMIN D PO) Take 2,000 Int'l Units by mouth daily.      denosumab (PROLIA) 60 MG/ML SOSY injection Inject 60 mg into the skin every 6 (six) months.     diclofenac sodium (VOLTAREN) 1 % GEL Apply topically.     FERREX 150 150 MG capsule Take 1 tablet by mouth daily.  1   folic acid (FOLVITE) 124 MCG tablet Take 400 mcg by mouth daily.     hydrALAZINE (APRESOLINE) 25 MG tablet Take 1 tablet (25 mg total) by mouth 2 (two) times daily. 180 tablet 3   hydroxychloroquine (PLAQUENIL) 200 MG tablet Take 200 mg by mouth 2 (two) times daily.   0   L-Methylfolate-B6-B12  (METANX PO) Take 1 tablet by mouth 2 (two) times daily.      loratadine (CLARITIN) 10 MG tablet Take 10 mg by mouth daily.     LORazepam (ATIVAN) 0.5 MG tablet Take 0.5 mg by mouth daily as needed.     losartan (  COZAAR) 100 MG tablet Take 100 mg by mouth daily.     magnesium oxide (MAG-OX) 400 (240 Mg) MG tablet TAKE 1 TABLET(400 MG) BY MOUTH DAILY 90 tablet 3   Multiple Vitamin (MULTIVITAMIN) tablet Take 1 tablet by mouth daily.     nabumetone (RELAFEN) 500 MG tablet Take 500 mg by mouth daily.     naratriptan (AMERGE) 2.5 MG tablet Take 1 tablet (2.5 mg total) by mouth as needed for migraine. Take one (1) tablet at onset of headache; if returns or does not resolve, may repeat after 4 hours; do not exceed five (5) mg in 24 hours. 10 tablet 5   nystatin (MYCOSTATIN/NYSTOP) powder Apply 1 Application topically 2 (two) times daily. 15 g 1   Omega-3 Fatty Acids (FISH OIL) 1000 MG CAPS Take 2 capsules by mouth daily.     omeprazole (PRILOSEC) 40 MG capsule Take 1 capsule (40 mg total) by mouth in the morning and at bedtime. 60 capsule 5   ondansetron (ZOFRAN-ODT) 4 MG disintegrating tablet DISSOLVE 1 TABLET ON THE TONGUE EVERY 8 HOURS AS NEEDED FOR NAUSEA OR VOMITING 20 tablet 11   oxyCODONE-acetaminophen (PERCOCET) 7.5-325 MG tablet Take 1 tablet by mouth every 8 (eight) hours.     potassium chloride SA (KLOR-CON M) 20 MEQ tablet TAKE 1 TABLET(20 MEQ) BY MOUTH TWICE DAILY 180 tablet 3   predniSONE (DELTASONE) 20 MG tablet Take 20 mg by mouth daily with breakfast.     pregabalin (LYRICA) 200 MG capsule Take 200 mg by mouth daily.     PREVIDENT 5000 DRY MOUTH 1.1 % GEL dental gel Place onto teeth as directed.     Rimegepant Sulfate (NURTEC) 75 MG TBDP Take 75 mg by mouth daily as needed (take for abortive therapy of migraine, no more than 1 tablet in 24 hours or 8 per month). 8 tablet 11   sertraline (ZOLOFT) 100 MG tablet Take 1 tablet by mouth daily.     sertraline (ZOLOFT) 25 MG tablet Take by mouth  with 100 mg tablet for total of 125 mg at bedtime.     solifenacin (VESICARE) 5 MG tablet Take 5 mg by mouth daily.     Suvorexant (BELSOMRA) 20 MG TABS Take 20 mg by mouth Nightly. Take one tablet at bedtime daily     tiZANidine (ZANAFLEX) 4 MG tablet 1 tablet Orally Up to TID for 30 days     tolterodine (DETROL LA) 4 MG 24 hr capsule Take 4 mg by mouth daily.     torsemide (DEMADEX) 20 MG tablet TAKE 2 TABLET BY MOUTH TWICE DAILY 360 tablet 3   XIIDRA 5 % SOLN Place 1 drop into both eyes 2 (two) times daily.     furosemide (LASIX) 40 MG tablet Take 40 mg by mouth daily.     No facility-administered medications prior to visit.     PAST MEDICAL HISTORY: Past Medical History:  Diagnosis Date   Anxiety    Arthritis    Atrophic vaginitis    Atypical chest pain    thought to be GERD; myoview 05/17/07-no significant ischemia; echo 07/20/11- normal EF=>55%   Baker's cyst 03/28/2012   venous doppler 03/28/12=no thrombus; baker's cyst at left popliteal fossa   Chest pain    CIN I (cervical intraepithelial neoplasia I) 1990   Degenerative disc disease, cervical 2020   severe, C3-4, 4-5, 5-6. Currently receiving injections by Marie. Vira Blanco of preferred pain management   Depression    Fibroid  Fibromyalgia    Hypertension    Lupus nephritis (HCC)    Migraines    Neuropathy    Osteopenia 09/2017   T score -2.0 improved from prior study   Premature menopause age 51   following chemotherapy for Lupus   Shingles      PAST SURGICAL HISTORY: Past Surgical History:  Procedure Laterality Date   cervical fossa injections  2020   CESAREAN SECTION     COLPOSCOPY     HYSTEROSCOPY     Myomectomy   MYOMECTOMY     Hysteroscopic myomectomy   NASAL SINUS SURGERY  01/2016   thumb surg  07/07/2019   TUBAL LIGATION     WRIST SURGERY Right 06/21/2020     FAMILY HISTORY: Family History  Problem Relation Age of Onset   Hypertension Mother    Asthma Mother    COPD Mother    Diabetes Father     Hypertension Father    Heart disease Father    Cancer Father        prostate     SOCIAL HISTORY: Social History   Socioeconomic History   Marital status: Married    Spouse name: [name]   Number of children: 2   Years of education: College   Highest education level: Not on file  Occupational History    Employer: DISABLED    Comment: disability  Tobacco Use   Smoking status: Never   Smokeless tobacco: Never  Vaping Use   Vaping Use: Never used  Substance and Sexual Activity   Alcohol use: No    Alcohol/week: 0.0 standard drinks of alcohol   Drug use: No   Sexual activity: Not Currently    Birth control/protection: Surgical, Post-menopausal    Comment: Tubal lig-1st intercourse 56 yo-Fewer than 5 partners,  Other Topics Concern   Not on file  Social History Narrative   Patient lives at home with her daughter. She is currently separated (not legally).   Caffeine Use: 1 cup of coffee daily.    Right-handed   Social Determinants of Health   Financial Resource Strain: Not on file  Food Insecurity: Not on file  Transportation Needs: Not on file  Physical Activity: Not on file  Stress: Not on file  Social Connections: Not on file  Intimate Partner Violence: Not on file      PHYSICAL EXAM  Vitals:   01/02/22 1029  BP: (!) 142/71  Pulse: (!) 50  Weight: 223 lb (101.2 kg)  Height: '5\' 4"'$  (1.626 m)     Body mass index is 38.28 kg/m.   Generalized: Well developed, in no acute distress   Neurological examination  Mentation: Alert oriented to time, place, history taking. Follows all commands speech and language fluent Cranial nerve II-XII: Pupils were equal round reactive to light. Extraocular movements were full, visual field were full  Motor: The motor testing reveals 5 over 5 strength of all 4 extremities. Good symmetric motor tone is noted throughout.  Gait and station: Gait is normal.      DIAGNOSTIC DATA (LABS, IMAGING, TESTING) - I reviewed  patient records, labs, notes, testing and imaging myself where available.  Lab Results  Component Value Date   WBC 5.4 12/17/2020   HGB 13.5 12/17/2020   HCT 40.2 12/17/2020   MCV 86 12/17/2020   PLT 242 12/17/2020      Component Value Date/Time   NA 142 08/17/2021 0855   NA 142 07/01/2013 1104   K 3.9 08/17/2021 0855  K 4.2 07/01/2013 1104   CL 100 08/17/2021 0855   CO2 31 (H) 08/17/2021 0855   CO2 26 07/01/2013 1104   GLUCOSE 88 08/17/2021 0855   GLUCOSE 61 (L) 09/10/2015 0833   GLUCOSE 94 07/01/2013 1104   BUN 25 (H) 08/17/2021 0855   BUN 12.7 07/01/2013 1104   CREATININE 1.00 11/23/2021 1049   CREATININE 0.9 07/01/2013 1104   CALCIUM 9.0 11/23/2021 1049   CALCIUM 9.5 07/01/2013 1104   PROT 6.7 03/24/2021 0852   PROT 6.7 07/01/2013 1104   ALBUMIN 4.0 03/24/2021 0852   ALBUMIN 3.9 07/01/2013 1104   AST 35 03/24/2021 0852   AST 47 (H) 07/01/2013 1104   ALT 23 03/24/2021 0852   ALT 36 07/01/2013 1104   ALKPHOS 146 (H) 03/24/2021 0852   ALKPHOS 80 07/01/2013 1104   BILITOT <0.2 03/24/2021 0852   BILITOT 0.48 07/01/2013 1104   GFRNONAA 83 05/18/2020 1024   GFRAA 96 05/18/2020 1024   Lab Results  Component Value Date   CHOL 135 03/24/2021   HDL 64 03/24/2021   LDLCALC 59 03/24/2021   TRIG 54 03/24/2021   CHOLHDL 2.1 03/24/2021   Lab Results  Component Value Date   HGBA1C 5.4 09/10/2018   Lab Results  Component Value Date   VITAMINB12 >2000 (H) 09/10/2018   Lab Results  Component Value Date   TSH 1.27 09/10/2015      ASSESSMENT AND PLAN  56 y.o. year old female  has a past medical history of Anxiety, Arthritis, Atrophic vaginitis, Atypical chest pain, Baker's cyst (03/28/2012), Chest pain, CIN I (cervical intraepithelial neoplasia I) (1990), Degenerative disc disease, cervical (2020), Depression, Fibroid, Fibromyalgia, Hypertension, Lupus nephritis ([place]), Migraines, Neuropathy, Osteopenia (09/2017), Premature menopause (age 80), and Shingles. here with    Migraine without aura and without status migrainosus, not intractable   Nahia is doing well. She is tolerating medications. We will continue Qulipta '60mg'$  daily and either Nurtec or naratriptan for abortive therapy. May combine Nurtec and naratriptan with Tylenol, Benadryl and Nsaid for intractable migraines. She will continue close follow up with PCP and specialist. She will monitor for symptoms of bradycardia. Healthy lifestyle habits encouraged. She will follow up with me in 1 year.    Debbora Presto, MSN, FNP-C 01/02/2022, 10:32 AM  [facility] [address], [place],  [address] [phone]

## 2022-01-09 DIAGNOSIS — M25562 Pain in left knee: Secondary | ICD-10-CM | POA: Diagnosis not present

## 2022-01-09 DIAGNOSIS — M545 Low back pain, unspecified: Secondary | ICD-10-CM | POA: Diagnosis not present

## 2022-01-09 DIAGNOSIS — M25561 Pain in right knee: Secondary | ICD-10-CM | POA: Diagnosis not present

## 2022-01-09 DIAGNOSIS — M47817 Spondylosis without myelopathy or radiculopathy, lumbosacral region: Secondary | ICD-10-CM | POA: Diagnosis not present

## 2022-01-09 DIAGNOSIS — Z79891 Long term (current) use of opiate analgesic: Secondary | ICD-10-CM | POA: Diagnosis not present

## 2022-01-13 ENCOUNTER — Other Ambulatory Visit: Payer: Self-pay | Admitting: Nurse Practitioner

## 2022-01-13 ENCOUNTER — Ambulatory Visit
Admission: RE | Admit: 2022-01-13 | Discharge: 2022-01-13 | Disposition: A | Discharge status: Home / Self Care | Payer: 59 | Source: Ambulatory Visit | Attending: Nurse Practitioner | Admitting: Nurse Practitioner

## 2022-01-13 DIAGNOSIS — M47817 Spondylosis without myelopathy or radiculopathy, lumbosacral region: Secondary | ICD-10-CM

## 2022-01-13 DIAGNOSIS — M25561 Pain in right knee: Secondary | ICD-10-CM

## 2022-01-13 DIAGNOSIS — M1711 Unilateral primary osteoarthritis, right knee: Secondary | ICD-10-CM | POA: Diagnosis not present

## 2022-01-13 DIAGNOSIS — M1712 Unilateral primary osteoarthritis, left knee: Secondary | ICD-10-CM | POA: Diagnosis not present

## 2022-01-13 DIAGNOSIS — M25461 Effusion, right knee: Secondary | ICD-10-CM | POA: Diagnosis not present

## 2022-01-13 DIAGNOSIS — M545 Low back pain, unspecified: Secondary | ICD-10-CM | POA: Diagnosis not present

## 2022-01-18 ENCOUNTER — Other Ambulatory Visit: Payer: Self-pay | Admitting: Nurse Practitioner

## 2022-01-18 DIAGNOSIS — B372 Candidiasis of skin and nail: Secondary | ICD-10-CM

## 2022-01-18 NOTE — Telephone Encounter (Signed)
Last annual exam was 09/2021

## 2022-01-19 ENCOUNTER — Ambulatory Visit: Payer: 59 | Admitting: Family Medicine

## 2022-01-20 ENCOUNTER — Ambulatory Visit (INDEPENDENT_AMBULATORY_CARE_PROVIDER_SITE_OTHER): Payer: 59 | Admitting: Podiatrist

## 2022-01-20 DIAGNOSIS — L600 Ingrowing nail: Secondary | ICD-10-CM

## 2022-01-20 MED ORDER — COLCHICINE 0.6 MG PO TABS: 0.6000 mg | ORAL_TABLET | Freq: Every day | ORAL | 2 refills | Status: DC

## 2022-01-20 MED ORDER — MUPIROCIN 2 % EX OINT: 1.0000 | TOPICAL_OINTMENT | Freq: Two times a day (BID) | CUTANEOUS | 2 refills | Status: DC

## 2022-01-20 NOTE — Progress Notes (Unsigned)
Chief Complaint  Patient presents with   Ingrown Toenail    Right great toe possible ingrown     HPI: Patient is 56 y.o. female who presents today for a painful ingrown toenail of the right foot. She states she has had the corners worked on before but feels she needs more of the nail removed as they are still painful.  She relates the sides are painful with pressure and with shoegear.  She also relates the pain she had on her left foot ins improving but still bothers her at times. Pain located that the dorsal second and third metatarsal area left foot.  Patient Active Problem List   Diagnosis Date Noted   Lower extremity edema 06/08/2021   Hyperlipidemia 06/08/2021   Myoclonic jerking while sleeping 09/11/2019   Bilateral carpal tunnel syndrome 09/10/2018   Sinus bradycardia on ECG 12/19/2016   History of systemic lupus erythematosus (SLE) ([place]) 12/19/2016   Migraine headache 10/12/2015   Shingles    Essential hypertension 03/07/2013   Small fiber neuropathy 10/22/2012   Thoracic or lumbosacral neuritis or radiculitis, unspecified 10/22/2012   History of shingles 10/22/2012   Hyperreflexia 10/22/2012   Arthritis    Atrophic vaginitis    Lupus nephritis ([place])    Fibroid     Current Outpatient Medications on File Prior to Visit  Medication Sig Dispense Refill   NYSTATIN powder APPLY 1 APPLICATION TOPICALLY TWICE DAILY 15 g 1   alendronate (FOSAMAX) 70 MG tablet TAKE 1 TABLET(70 MG) BY MOUTH 1 TIME A WEEK WITH A FULL GLASS OF WATER AND ON AN EMPTY STOMACH 4 tablet 0   aspirin 81 MG tablet Take 81 mg by mouth daily.     Atogepant (QULIPTA) 60 MG TABS Take 60 mg by mouth daily. 90 tablet 3   atorvastatin (LIPITOR) 40 MG tablet Take 1 tablet (40 mg total) by mouth daily. 90 tablet 3   Buprenorphine HCl (BELBUCA) 450 MCG FILM 1 film Bucally every 12 hrs for 30 days     Cholecalciferol (VITAMIN D PO) Take 2,000 Int'l Units by mouth daily.      denosumab (PROLIA) 60 MG/ML SOSY injection  Inject 60 mg into the skin every 6 (six) months.     diclofenac sodium (VOLTAREN) 1 % GEL Apply topically.     FERREX 150 150 MG capsule Take 1 tablet by mouth daily.  1   folic acid (FOLVITE) 510 MCG tablet Take 400 mcg by mouth daily.     hydrALAZINE (APRESOLINE) 25 MG tablet Take 1 tablet (25 mg total) by mouth 2 (two) times daily. 180 tablet 3   hydroxychloroquine (PLAQUENIL) 200 MG tablet Take 200 mg by mouth 2 (two) times daily.   0   L-Methylfolate-B6-B12 (METANX PO) Take 1 tablet by mouth 2 (two) times daily.      loratadine (CLARITIN) 10 MG tablet Take 10 mg by mouth daily.     LORazepam (ATIVAN) 0.5 MG tablet Take 0.5 mg by mouth daily as needed.     losartan (COZAAR) 100 MG tablet Take 100 mg by mouth daily.     magnesium oxide (MAG-OX) 400 (240 Mg) MG tablet TAKE 1 TABLET(400 MG) BY MOUTH DAILY 90 tablet 3   Multiple Vitamin (MULTIVITAMIN) tablet Take 1 tablet by mouth daily.     nabumetone (RELAFEN) 500 MG tablet Take 500 mg by mouth daily.     naratriptan (AMERGE) 2.5 MG tablet Take 1 tablet (2.5 mg total) by mouth as needed for migraine. Take  one (1) tablet at onset of headache; if returns or does not resolve, may repeat after 4 hours; do not exceed five (5) mg in 24 hours. 10 tablet 11   Omega-3 Fatty Acids (FISH OIL) 1000 MG CAPS Take 2 capsules by mouth daily.     omeprazole (PRILOSEC) 40 MG capsule Take 1 capsule (40 mg total) by mouth in the morning and at bedtime. 60 capsule 5   ondansetron (ZOFRAN-ODT) 4 MG disintegrating tablet DISSOLVE 1 TABLET ON THE TONGUE EVERY 8 HOURS AS NEEDED FOR NAUSEA OR VOMITING 20 tablet 11   potassium chloride SA (KLOR-CON M) 20 MEQ tablet TAKE 1 TABLET(20 MEQ) BY MOUTH TWICE DAILY 180 tablet 3   predniSONE (DELTASONE) 20 MG tablet Take 20 mg by mouth daily with breakfast.     pregabalin (LYRICA) 200 MG capsule Take 200 mg by mouth daily.     PREVIDENT 5000 DRY MOUTH 1.1 % GEL dental gel Place onto teeth as directed.     Rimegepant Sulfate  (NURTEC) 75 MG TBDP Take 75 mg by mouth daily as needed (take for abortive therapy of migraine, no more than 1 tablet in 24 hours or 8 per month). 8 tablet 11   sertraline (ZOLOFT) 100 MG tablet Take 1 tablet by mouth daily.     sertraline (ZOLOFT) 25 MG tablet Take by mouth with 100 mg tablet for total of 125 mg at bedtime.     solifenacin (VESICARE) 5 MG tablet Take 5 mg by mouth daily.     Suvorexant (BELSOMRA) 20 MG TABS Take 20 mg by mouth Nightly. Take one tablet at bedtime daily     tiZANidine (ZANAFLEX) 4 MG tablet 1 tablet Orally Up to TID for 30 days     tolterodine (DETROL LA) 4 MG 24 hr capsule Take 4 mg by mouth daily.     torsemide (DEMADEX) 20 MG tablet TAKE 2 TABLET BY MOUTH TWICE DAILY 360 tablet 3   XIIDRA 5 % SOLN Place 1 drop into both eyes 2 (two) times daily.     No current facility-administered medications on file prior to visit.    Allergies  Allergen Reactions   Ubrogepant Swelling    Lip swelling    Benazepril     Lip swelling   Cymbalta [Duloxetine Hcl]     Lip swelling   Hygroton [Chlorthalidone] Swelling    Swelling in lips   Mirabegron Itching    Other reaction(s): rash   Other     Other reaction(s): Unknown   Duloxetine Rash    Lip swelling    Review of Systems No fevers, chills, nausea, muscle aches, no difficulty breathing, no calf pain, no chest pain or shortness of breath.   Physical Exam  GENERAL APPEARANCE: Alert, conversant. Appropriately groomed. No acute distress.   VASCULAR: Pedal pulses palpable 2/4 DP and 1/4 PT bilateral.  Capillary refill time is immediate to all digits,  Proximal to distal cooling is warm to warm.  Digital perfusion adequate.   NEUROLOGIC: sensation is intact to 5.07 monofilament at 5/5 sites bilateral.  Light touch is intact bilateral, vibratory sensation intact bilateral  MUSCULOSKELETAL: acceptable muscle strength, tone and stability bilateral.  No gross boney pedal deformities noted.  Mild pain on palpation  dorsal second and third metatarsal left foot with pressure- likely resolving sprain/ contusion.   DERMATOLOGIC: skin is warm, supple, and dry.  Color, texture, and turgor of skin within normal limits.  Right hallux medial and lateral nail borders are painful  with pressure-  no redness, swelling or sign of infection noted.     Assessment     ICD-10-CM   1. Ingrown right greater toenail  L60.0        Plan  Discussed exam findings and treatment options.  Discussed we could remove more of the nail on the borders to see if this would help her pain. She would like to proceed.    Skin was prepped with alcohol and a local injection of lidocaine and Marcaine plain was infiltrated to anesthetize the toe. The toe was then prepped with Betadine and exsanguinated. The offending nail border was removed and phenol applied to the exposed matrix tissue.  The area was then cleansed well with alcohol.  Antibiotic ointment and a dressing was then applied the tourniquet released  noting a prompt hyperemic response to the tip of the toe.  Oral and written instructions were dispensed and the patient was instructed on aftercare.  If there is any increased redness, swelling, drainage, pus or any other concerns arise, Deshana will call to be seen.

## 2022-01-20 NOTE — Patient Instructions (Signed)

## 2022-01-30 DIAGNOSIS — M329 Systemic lupus erythematosus, unspecified: Secondary | ICD-10-CM | POA: Diagnosis not present

## 2022-02-02 DIAGNOSIS — Z79899 Other long term (current) drug therapy: Secondary | ICD-10-CM | POA: Diagnosis not present

## 2022-02-02 DIAGNOSIS — I10 Essential (primary) hypertension: Secondary | ICD-10-CM | POA: Diagnosis not present

## 2022-02-02 DIAGNOSIS — R635 Abnormal weight gain: Secondary | ICD-10-CM | POA: Diagnosis not present

## 2022-02-02 DIAGNOSIS — M329 Systemic lupus erythematosus, unspecified: Secondary | ICD-10-CM | POA: Diagnosis not present

## 2022-02-02 DIAGNOSIS — M1990 Unspecified osteoarthritis, unspecified site: Secondary | ICD-10-CM | POA: Diagnosis not present

## 2022-02-02 DIAGNOSIS — M810 Age-related osteoporosis without current pathological fracture: Secondary | ICD-10-CM | POA: Diagnosis not present

## 2022-02-02 DIAGNOSIS — M797 Fibromyalgia: Secondary | ICD-10-CM | POA: Diagnosis not present

## 2022-02-02 DIAGNOSIS — M549 Dorsalgia, unspecified: Secondary | ICD-10-CM | POA: Diagnosis not present

## 2022-02-07 DIAGNOSIS — M545 Low back pain, unspecified: Secondary | ICD-10-CM | POA: Diagnosis not present

## 2022-02-07 DIAGNOSIS — M25561 Pain in right knee: Secondary | ICD-10-CM | POA: Diagnosis not present

## 2022-02-07 DIAGNOSIS — Z79891 Long term (current) use of opiate analgesic: Secondary | ICD-10-CM | POA: Diagnosis not present

## 2022-02-07 DIAGNOSIS — M25562 Pain in left knee: Secondary | ICD-10-CM | POA: Diagnosis not present

## 2022-02-07 DIAGNOSIS — M47817 Spondylosis without myelopathy or radiculopathy, lumbosacral region: Secondary | ICD-10-CM | POA: Diagnosis not present

## 2022-02-07 DIAGNOSIS — M791 Myalgia, unspecified site: Secondary | ICD-10-CM | POA: Diagnosis not present

## 2022-02-09 ENCOUNTER — Other Ambulatory Visit: Payer: Self-pay | Admitting: Nurse Practitioner

## 2022-02-09 DIAGNOSIS — Z9221 Personal history of antineoplastic chemotherapy: Secondary | ICD-10-CM | POA: Diagnosis not present

## 2022-02-09 DIAGNOSIS — R809 Proteinuria, unspecified: Secondary | ICD-10-CM | POA: Diagnosis not present

## 2022-02-09 DIAGNOSIS — M47817 Spondylosis without myelopathy or radiculopathy, lumbosacral region: Secondary | ICD-10-CM

## 2022-02-09 DIAGNOSIS — Z8739 Personal history of other diseases of the musculoskeletal system and connective tissue: Secondary | ICD-10-CM | POA: Diagnosis not present

## 2022-02-09 DIAGNOSIS — I10 Essential (primary) hypertension: Secondary | ICD-10-CM | POA: Diagnosis not present

## 2022-02-13 DIAGNOSIS — F331 Major depressive disorder, recurrent, moderate: Secondary | ICD-10-CM | POA: Diagnosis not present

## 2022-02-13 DIAGNOSIS — G4700 Insomnia, unspecified: Secondary | ICD-10-CM | POA: Diagnosis not present

## 2022-02-13 DIAGNOSIS — F419 Anxiety disorder, unspecified: Secondary | ICD-10-CM | POA: Diagnosis not present

## 2022-02-25 ENCOUNTER — Ambulatory Visit
Admission: RE | Admit: 2022-02-25 | Discharge: 2022-02-25 | Disposition: A | Discharge status: Home / Self Care | Payer: 59 | Source: Ambulatory Visit | Attending: Nurse Practitioner | Admitting: Nurse Practitioner

## 2022-02-25 DIAGNOSIS — M47817 Spondylosis without myelopathy or radiculopathy, lumbosacral region: Secondary | ICD-10-CM

## 2022-04-03 DIAGNOSIS — F331 Major depressive disorder, recurrent, moderate: Secondary | ICD-10-CM | POA: Diagnosis not present

## 2022-04-04 DIAGNOSIS — M791 Myalgia, unspecified site: Secondary | ICD-10-CM | POA: Diagnosis not present

## 2022-04-04 DIAGNOSIS — M25562 Pain in left knee: Secondary | ICD-10-CM | POA: Diagnosis not present

## 2022-04-04 DIAGNOSIS — M545 Low back pain, unspecified: Secondary | ICD-10-CM | POA: Diagnosis not present

## 2022-04-04 DIAGNOSIS — Z79891 Long term (current) use of opiate analgesic: Secondary | ICD-10-CM | POA: Diagnosis not present

## 2022-04-04 DIAGNOSIS — M25561 Pain in right knee: Secondary | ICD-10-CM | POA: Diagnosis not present

## 2022-04-04 DIAGNOSIS — M47817 Spondylosis without myelopathy or radiculopathy, lumbosacral region: Secondary | ICD-10-CM | POA: Diagnosis not present

## 2022-04-19 DIAGNOSIS — Z79899 Other long term (current) drug therapy: Secondary | ICD-10-CM | POA: Diagnosis not present

## 2022-04-19 DIAGNOSIS — E785 Hyperlipidemia, unspecified: Secondary | ICD-10-CM | POA: Diagnosis not present

## 2022-04-19 DIAGNOSIS — Z0000 Encounter for general adult medical examination without abnormal findings: Secondary | ICD-10-CM | POA: Diagnosis not present

## 2022-04-19 DIAGNOSIS — G4700 Insomnia, unspecified: Secondary | ICD-10-CM | POA: Diagnosis not present

## 2022-04-19 DIAGNOSIS — I10 Essential (primary) hypertension: Secondary | ICD-10-CM | POA: Diagnosis not present

## 2022-04-19 DIAGNOSIS — R7303 Prediabetes: Secondary | ICD-10-CM | POA: Diagnosis not present

## 2022-04-19 DIAGNOSIS — M797 Fibromyalgia: Secondary | ICD-10-CM | POA: Diagnosis not present

## 2022-04-19 DIAGNOSIS — M329 Systemic lupus erythematosus, unspecified: Secondary | ICD-10-CM | POA: Diagnosis not present

## 2022-04-19 DIAGNOSIS — G43909 Migraine, unspecified, not intractable, without status migrainosus: Secondary | ICD-10-CM | POA: Diagnosis not present

## 2022-04-19 DIAGNOSIS — I5022 Chronic systolic (congestive) heart failure: Secondary | ICD-10-CM | POA: Diagnosis not present

## 2022-04-19 DIAGNOSIS — Z23 Encounter for immunization: Secondary | ICD-10-CM | POA: Diagnosis not present

## 2022-04-27 ENCOUNTER — Encounter: Payer: Self-pay | Admitting: Family Medicine

## 2022-05-03 DIAGNOSIS — M25551 Pain in right hip: Secondary | ICD-10-CM | POA: Diagnosis not present

## 2022-05-03 DIAGNOSIS — M47817 Spondylosis without myelopathy or radiculopathy, lumbosacral region: Secondary | ICD-10-CM | POA: Diagnosis not present

## 2022-05-03 DIAGNOSIS — M25561 Pain in right knee: Secondary | ICD-10-CM | POA: Diagnosis not present

## 2022-05-03 DIAGNOSIS — M25562 Pain in left knee: Secondary | ICD-10-CM | POA: Diagnosis not present

## 2022-05-03 DIAGNOSIS — M545 Low back pain, unspecified: Secondary | ICD-10-CM | POA: Diagnosis not present

## 2022-05-03 DIAGNOSIS — Z79891 Long term (current) use of opiate analgesic: Secondary | ICD-10-CM | POA: Diagnosis not present

## 2022-05-08 DIAGNOSIS — M329 Systemic lupus erythematosus, unspecified: Secondary | ICD-10-CM | POA: Diagnosis not present

## 2022-05-08 DIAGNOSIS — R635 Abnormal weight gain: Secondary | ICD-10-CM | POA: Diagnosis not present

## 2022-05-08 DIAGNOSIS — M549 Dorsalgia, unspecified: Secondary | ICD-10-CM | POA: Diagnosis not present

## 2022-05-08 DIAGNOSIS — M797 Fibromyalgia: Secondary | ICD-10-CM | POA: Diagnosis not present

## 2022-05-08 DIAGNOSIS — Z79899 Other long term (current) drug therapy: Secondary | ICD-10-CM | POA: Diagnosis not present

## 2022-05-08 DIAGNOSIS — M25559 Pain in unspecified hip: Secondary | ICD-10-CM | POA: Diagnosis not present

## 2022-05-08 DIAGNOSIS — M25552 Pain in left hip: Secondary | ICD-10-CM | POA: Diagnosis not present

## 2022-05-08 DIAGNOSIS — I10 Essential (primary) hypertension: Secondary | ICD-10-CM | POA: Diagnosis not present

## 2022-05-08 DIAGNOSIS — M1990 Unspecified osteoarthritis, unspecified site: Secondary | ICD-10-CM | POA: Diagnosis not present

## 2022-05-08 DIAGNOSIS — M810 Age-related osteoporosis without current pathological fracture: Secondary | ICD-10-CM | POA: Diagnosis not present

## 2022-05-08 DIAGNOSIS — M25551 Pain in right hip: Secondary | ICD-10-CM | POA: Diagnosis not present

## 2022-05-22 ENCOUNTER — Telehealth: Payer: Self-pay | Admitting: *Deleted

## 2022-05-22 NOTE — Telephone Encounter (Signed)
Dual coverage will file 1st   Annual exam 11/23/2021   Calcium  9.0           Date 11/23/2021  Upcoming dental procedures   Is Prior Authorization needed yes approved scanned in epic Auth # [id number] Valid 12/02/2021-12/03/2022  Pt estimated Cost $0

## 2022-05-26 ENCOUNTER — Other Ambulatory Visit: Payer: Self-pay | Admitting: Family Medicine

## 2022-05-26 ENCOUNTER — Other Ambulatory Visit: Payer: Self-pay | Admitting: Cardiovascular Disease

## 2022-05-26 DIAGNOSIS — Z1231 Encounter for screening mammogram for malignant neoplasm of breast: Secondary | ICD-10-CM

## 2022-06-05 ENCOUNTER — Other Ambulatory Visit: Payer: Self-pay | Admitting: Nurse Practitioner

## 2022-06-05 ENCOUNTER — Ambulatory Visit
Admission: RE | Admit: 2022-06-05 | Discharge: 2022-06-05 | Disposition: A | Discharge status: Home / Self Care | Payer: 59 | Source: Ambulatory Visit | Attending: Nurse Practitioner | Admitting: Nurse Practitioner

## 2022-06-05 ENCOUNTER — Ambulatory Visit (INDEPENDENT_AMBULATORY_CARE_PROVIDER_SITE_OTHER): Payer: 59

## 2022-06-05 DIAGNOSIS — M810 Age-related osteoporosis without current pathological fracture: Secondary | ICD-10-CM

## 2022-06-05 DIAGNOSIS — M25551 Pain in right hip: Secondary | ICD-10-CM

## 2022-06-05 MED ORDER — DENOSUMAB 60 MG/ML ~~LOC~~ SOSY
60.0000 mg | PREFILLED_SYRINGE | Freq: Once | SUBCUTANEOUS | Status: AC
  Administered 2022-06-05: 60 mg via SUBCUTANEOUS

## 2022-06-08 NOTE — Telephone Encounter (Signed)
Patient received prolia on 06-05-22. Summary of benefits scanned into Epic.   Encounter closed.

## 2022-06-16 ENCOUNTER — Encounter: Payer: Self-pay | Admitting: Cardiovascular Disease

## 2022-07-13 ENCOUNTER — Other Ambulatory Visit: Payer: Self-pay | Admitting: Cardiovascular Disease

## 2022-07-13 ENCOUNTER — Ambulatory Visit
Admission: RE | Admit: 2022-07-13 | Discharge: 2022-07-13 | Disposition: A | Discharge status: Home / Self Care | Payer: 59 | Source: Ambulatory Visit | Attending: Family Medicine | Admitting: Family Medicine

## 2022-07-13 DIAGNOSIS — Z1231 Encounter for screening mammogram for malignant neoplasm of breast: Secondary | ICD-10-CM

## 2022-07-25 ENCOUNTER — Ambulatory Visit: Payer: 59 | Admitting: Cardiovascular Disease

## 2022-07-25 ENCOUNTER — Other Ambulatory Visit: Payer: Self-pay | Admitting: Cardiovascular Disease

## 2022-08-09 ENCOUNTER — Other Ambulatory Visit: Payer: Self-pay | Admitting: General Practice

## 2022-08-26 ENCOUNTER — Other Ambulatory Visit: Payer: Self-pay | Admitting: Cardiovascular Disease

## 2022-08-27 ENCOUNTER — Other Ambulatory Visit: Payer: Self-pay | Admitting: Cardiovascular Disease

## 2022-08-29 ENCOUNTER — Ambulatory Visit: Payer: 59 | Attending: Cardiovascular Disease | Admitting: Cardiovascular Disease

## 2022-08-29 ENCOUNTER — Encounter: Payer: Self-pay | Admitting: Cardiovascular Disease

## 2022-08-29 VITALS — BP 124/78 | HR 58 | Ht 64.0 in | Wt 226.8 lb

## 2022-08-29 DIAGNOSIS — E782 Mixed hyperlipidemia: Secondary | ICD-10-CM | POA: Diagnosis not present

## 2022-08-29 DIAGNOSIS — R600 Localized edema: Secondary | ICD-10-CM | POA: Diagnosis not present

## 2022-08-29 DIAGNOSIS — I10 Essential (primary) hypertension: Secondary | ICD-10-CM

## 2022-08-29 NOTE — Assessment & Plan Note (Signed)
History of lower extremity edema on torsemide which is under excellent control.

## 2022-08-29 NOTE — Progress Notes (Signed)
08/29/2022 Marie Peterson   May 30, 1965  [id number]  Primary Physician Irena Reichmann, DO Primary Cardiologist: Runell Gess MD Nicholes [place], [place]  HPI:  Marie Peterson is a 57 y.o.  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 Kandis Cocking. She currently is out on disability because of SLE. I last saw her in the office 06/08/2021.[name] Kitchen  She has seen Marjie Skiff, PA-C several times since then.  Her factors include treated hypertension. Her father had bypass surgery at age 26. She's never had a heart attack or stroke. She denies chest pain or shortness of breath. She does have GERD. Since I saw her in the office one year ago she continues to have occasional atypical chest pain. A routine GXT performed 11/12/14 was entirely normal.    We obtained a 2D echo 12/28/2016 which was normal and a monitor 01/04/2017 that showed sinus bradycardia with heart rates in the 40s to 60s.  This was performed because of fatigue.  She does get some dyspnea on exertion and I think the majority of her symptoms are related to her rheumatologic disorders including SLE and fibromyalgia which is a recent diagnosis   She had a 2D echo performed 06/09/2020 which was essentially normal as well as a coronary CTA revealed a coronary calcium score of 38 with nonobstructive disease.   Since I saw her a year ago she is remained stable.  She states over the last year her lupus and fibromyalgia have "flared up".  She denies chest pain or shortness of breath.   Current Meds  Medication Sig   alendronate (FOSAMAX) 70 MG tablet TAKE 1 TABLET(70 MG) BY MOUTH 1 TIME A WEEK WITH A FULL GLASS OF WATER AND ON AN EMPTY STOMACH   aspirin 81 MG tablet Take 81 mg by mouth daily.   Atogepant  (QULIPTA) 60 MG TABS Take 60 mg by mouth daily.   atorvastatin (LIPITOR) 40 MG tablet Take 1 tablet (40 mg total) by mouth daily.   BENLYSTA 200 MG/ML SOSY Inject 4 mLs into the skin once a week.   benzonatate (TESSALON) 100 MG capsule Take 100 mg by mouth 3 (three) times daily as needed for cough.   Buprenorphine HCl (BELBUCA) 450 MCG FILM 1 film Bucally every 12 hrs for 30 days   Cholecalciferol (VITAMIN D PO) Take 2,000 Int'l Units by mouth daily.    denosumab (PROLIA) 60 MG/ML SOSY injection Inject 60 mg into the skin every 6 (six) months.   diclofenac sodium (VOLTAREN) 1 % GEL Apply topically.   FERREX 150 150 MG capsule Take 1 tablet by mouth daily.   folic acid (FOLVITE) 400 MCG tablet Take 400 mcg by mouth daily.   hydrALAZINE (APRESOLINE) 25 MG tablet TAKE 1 TABLET(25 MG) BY MOUTH TWICE DAILY   hydroxychloroquine (PLAQUENIL) 200 MG tablet Take 200 mg by mouth 2 (two) times daily.    L-Methylfolate-B6-B12 (METANX PO) Take 1 tablet by mouth 2 (two) times daily.    loratadine (CLARITIN) 10 MG tablet Take 10 mg by mouth daily.   LORazepam (ATIVAN) 0.5 MG tablet Take 0.5 mg by mouth daily as needed.   losartan (COZAAR) 100 MG tablet Take 100 mg by mouth daily.   magnesium oxide (MAG-OX) 400 (  240 Mg) MG tablet TAKE 1 TABLET(400 MG) BY MOUTH DAILY   Multiple Vitamin (MULTIVITAMIN) tablet Take 1 tablet by mouth daily.   mupirocin ointment (BACTROBAN) 2 % Apply 1 Application topically 2 (two) times daily.   nabumetone (RELAFEN) 500 MG tablet Take 500 mg by mouth daily.   naratriptan (AMERGE) 2.5 MG tablet Take 1 tablet (2.5 mg total) by mouth as needed for migraine. Take one (1) tablet at onset of headache; if returns or does not resolve, may repeat after 4 hours; do not exceed five (5) mg in 24 hours.   NYSTATIN powder APPLY 1 APPLICATION TOPICALLY TWICE DAILY   Omega-3 Fatty Acids (FISH OIL) 1000 MG CAPS Take 2 capsules by mouth daily.   omeprazole (PRILOSEC) 40 MG capsule Take 1 capsule (40  mg total) by mouth in the morning and at bedtime.   ondansetron (ZOFRAN-ODT) 4 MG disintegrating tablet DISSOLVE 1 TABLET ON THE TONGUE EVERY 8 HOURS AS NEEDED FOR NAUSEA OR VOMITING   potassium chloride SA (KLOR-CON M) 20 MEQ tablet TAKE 1 TABLET(20 MEQ) BY MOUTH TWICE DAILY   predniSONE (DELTASONE) 20 MG tablet Take 20 mg by mouth daily with breakfast.   pregabalin (LYRICA) 200 MG capsule Take 200 mg by mouth daily.   PREVIDENT 5000 DRY MOUTH 1.1 % GEL dental gel Place onto teeth as directed.   Rimegepant Sulfate (NURTEC) 75 MG TBDP Take 75 mg by mouth daily as needed (take for abortive therapy of migraine, no more than 1 tablet in 24 hours or 8 per month).   sertraline (ZOLOFT) 100 MG tablet Take 1 tablet by mouth daily.   sertraline (ZOLOFT) 25 MG tablet Take by mouth with 100 mg tablet for total of 125 mg at bedtime.   solifenacin (VESICARE) 5 MG tablet Take 5 mg by mouth daily.   Suvorexant (BELSOMRA) 20 MG TABS Take 20 mg by mouth Nightly. Take one tablet at bedtime daily   tiZANidine (ZANAFLEX) 4 MG tablet 1 tablet Orally Up to TID for 30 days   tolterodine (DETROL LA) 4 MG 24 hr capsule Take 4 mg by mouth daily.   torsemide (DEMADEX) 20 MG tablet TAKE 2 TABLETS(40 MG) BY MOUTH TWICE DAILY   XIIDRA 5 % SOLN Place 1 drop into both eyes 2 (two) times daily.     Allergies  Allergen Reactions   Ubrogepant Swelling    Lip swelling    Benazepril     Lip swelling   Cymbalta [Duloxetine Hcl]     Lip swelling   Hygroton [Chlorthalidone] Swelling    Swelling in lips   Mirabegron Itching    Other reaction(s): rash   Other     Other reaction(s): Unknown   Duloxetine Rash    Lip swelling    Social History   Socioeconomic History   Marital status: Married    Spouse name: Thereasa Distance   Number of children: 2   Years of education: College   Highest education level: Not on file  Occupational History    Employer: DISABLED    Comment: disability  Tobacco Use   Smoking status: Never    Smokeless tobacco: Never  Vaping Use   Vaping Use: Never used  Substance and Sexual Activity   Alcohol use: No    Alcohol/week: 0.0 standard drinks of alcohol   Drug use: No   Sexual activity: Not Currently    Birth control/protection: Surgical, Post-menopausal    Comment: Tubal lig-1st intercourse 57 yo-Fewer than 5 partners,  Other Topics  Concern   Not on file  Social History Narrative   Patient lives at home with her daughter. She is currently separated (not legally).   Caffeine Use: 1 cup of coffee daily.    Right-handed   Social Determinants of Corporate investment banker Strain: Not on file  Food Insecurity: Not on file  Transportation Needs: Not on file  Physical Activity: Not on file  Stress: Not on file  Social Connections: Not on file  Intimate Partner Violence: Not on file     Review of Systems: General: negative for chills, fever, night sweats or weight changes.  Cardiovascular: negative for chest pain, dyspnea on exertion, edema, orthopnea, palpitations, paroxysmal nocturnal dyspnea or shortness of breath Dermatological: negative for rash Respiratory: negative for cough or wheezing Urologic: negative for hematuria Abdominal: negative for nausea, vomiting, diarrhea, bright red blood per rectum, melena, or hematemesis Neurologic: negative for visual changes, syncope, or dizziness All other systems reviewed and are otherwise negative except as noted above.    Blood pressure 124/78, pulse (!) 58, height 5\' 4"  (1.626 m), weight 226 lb 12.8 oz (102.9 kg), SpO2 95 %.  General appearance: alert and no distress Neck: no adenopathy, no carotid bruit, no JVD, supple, symmetrical, trachea midline, and thyroid not enlarged, symmetric, no tenderness/mass/nodules Lungs: clear to auscultation bilaterally Heart: regular rate and rhythm, S1, S2 normal, no murmur, click, rub or gallop Extremities: extremities normal, atraumatic, no cyanosis or edema Pulses: 2+ and  symmetric Skin: Skin color, texture, turgor normal. No rashes or lesions Neurologic: Grossly normal  EKG sinus bradycardia at 58 with sinus arrhythmia.  I personally reviewed this EKG.  ASSESSMENT AND PLAN:   Essential hypertension History of essential hypertension blood pressure measured today at 124/78.  She is on hydralazine, losartan.  Lower extremity edema History of lower extremity edema on torsemide which is under excellent control.  Hyperlipidemia History of hyperlipidemia on statin therapy with lipid profile performed 03/24/2021 revealing total cholesterol 135, LDL 59 and HDL 64.     Runell Gess MD [facility], [facility] 08/29/2022 9:25 AM

## 2022-08-29 NOTE — Patient Instructions (Signed)
    Follow-Up: At [facility], you and your health needs are our priority.  As part of our continuing mission to provide you with exceptional heart care, we have created designated Provider Care Teams.  These Care Teams include your primary Cardiologist (physician) and Advanced Practice Providers (APPs -  Physician Assistants and Nurse Practitioners) who all work together to provide you with the care you need, when you need it.  We recommend signing up for the patient portal called "MyChart".  Sign up information is provided on this After Visit Summary.  MyChart is used to connect with patients for Virtual Visits (Telemedicine).  Patients are able to view lab/test results, encounter notes, upcoming appointments, etc.  Non-urgent messages can be sent to your provider as well.   To learn more about what you can do with MyChart, go to [email or web address].    Your next appointment:   12 month(s)  Provider:   Jonathan Berry, MD      

## 2022-08-29 NOTE — Assessment & Plan Note (Signed)
History of hyperlipidemia on statin therapy with lipid profile performed 03/24/2021 revealing total cholesterol 135, LDL 59 and HDL 64.

## 2022-08-29 NOTE — Assessment & Plan Note (Signed)
History of essential hypertension blood pressure measured today at 124/78.  She is on hydralazine, losartan.

## 2022-08-30 ENCOUNTER — Other Ambulatory Visit: Payer: Self-pay

## 2022-08-30 MED ORDER — TORSEMIDE 20 MG PO TABS: ORAL_TABLET | ORAL | 3 refills | Status: DC

## 2022-09-01 ENCOUNTER — Other Ambulatory Visit: Payer: Self-pay | Admitting: Cardiovascular Disease

## 2022-09-06 ENCOUNTER — Other Ambulatory Visit: Payer: Self-pay

## 2022-09-06 MED ORDER — TORSEMIDE 20 MG PO TABS: ORAL_TABLET | ORAL | 3 refills | Status: DC

## 2022-09-12 ENCOUNTER — Other Ambulatory Visit: Payer: Self-pay | Admitting: Cardiovascular Disease

## 2022-10-03 ENCOUNTER — Ambulatory Visit
Admission: RE | Admit: 2022-10-03 | Discharge: 2022-10-03 | Disposition: A | Discharge status: Home / Self Care | Payer: 59 | Source: Ambulatory Visit | Attending: Nurse Practitioner | Admitting: Nurse Practitioner

## 2022-10-03 ENCOUNTER — Other Ambulatory Visit: Payer: Self-pay | Admitting: Nurse Practitioner

## 2022-10-03 DIAGNOSIS — M25511 Pain in right shoulder: Secondary | ICD-10-CM

## 2022-10-18 ENCOUNTER — Encounter: Payer: Self-pay | Admitting: Family Medicine

## 2022-10-19 ENCOUNTER — Other Ambulatory Visit: Payer: Self-pay | Admitting: Family Medicine

## 2022-10-19 MED ORDER — QULIPTA 60 MG PO TABS: 60.0000 mg | ORAL_TABLET | Freq: Every day | ORAL | 3 refills | Status: DC

## 2022-10-19 NOTE — Telephone Encounter (Signed)
Marie Peterson can send in refill on Qulipta 60mg  tabs , it appears her Rx just expired.

## 2022-10-19 NOTE — Addendum Note (Signed)
Addended by: Aura Camps on: 10/19/2022 09:37 AM   Modules accepted: Orders

## 2022-11-06 ENCOUNTER — Telehealth: Payer: Self-pay | Admitting: Podiatry

## 2022-11-06 NOTE — Telephone Encounter (Signed)
Pt left message 8.9 at 247pm stating she has hx of plantar fasciitis and has seen Dr Allena Katz and is having a flare up.  I left message for pt to call to schedule an appt.

## 2022-11-16 ENCOUNTER — Encounter: Payer: Self-pay | Admitting: Family Medicine

## 2022-11-20 NOTE — Telephone Encounter (Signed)
Patient added to wait list

## 2022-11-21 ENCOUNTER — Telehealth: Payer: Self-pay | Admitting: *Deleted

## 2022-11-21 NOTE — Telephone Encounter (Signed)
Insurance information submitted to [employer] portal. Will await summary of benefits for prolia.    

## 2022-11-22 ENCOUNTER — Ambulatory Visit (INDEPENDENT_AMBULATORY_CARE_PROVIDER_SITE_OTHER): Payer: 59 | Admitting: Podiatry

## 2022-11-22 DIAGNOSIS — M778 Other enthesopathies, not elsewhere classified: Secondary | ICD-10-CM | POA: Diagnosis not present

## 2022-11-22 NOTE — Progress Notes (Signed)
Subjective:  Patient ID: Marie Peterson, female    DOB: 27-Jan-1966,  MRN: [id number]  Chief Complaint  Patient presents with   Foot Pain    57 y.o. female presents with the above complaint.  Patient presents with left dorsal foot pain.  Patient states it came out of nowhere she did go down an incline in the backyard.  She says been causing a lot of issues it hurts with ambulation is with pressure she has not seen and was prior to seeing me denies any other acute complaints.  Pain scale is 5 out of 10 dull aching nature.   Review of Systems: Negative except as noted in the HPI. Denies N/V/F/Ch.  Past Medical History:  Diagnosis Date   Anxiety    Arthritis    Atrophic vaginitis    Atypical chest pain    thought to be GERD; myoview 05/17/07-no significant ischemia; echo 07/20/11- normal EF=>55%   Baker's cyst 03/28/2012   venous doppler 03/28/12=no thrombus; baker's cyst at left popliteal fossa   Chest pain    CIN I (cervical intraepithelial neoplasia I) 1990   Degenerative disc disease, cervical 2020   severe, C3-4, 4-5, 5-6. Currently receiving injections by Dr. Jordan Likes of preferred pain management   Depression    Fibroid    Fibromyalgia    Hypertension    Lupus nephritis (HCC)    Migraines    Neuropathy    Osteopenia 09/2017   T score -2.0 improved from prior study   Premature menopause age 35   following chemotherapy for Lupus   Shingles     Current Outpatient Medications:    alendronate (FOSAMAX) 70 MG tablet, TAKE 1 TABLET(70 MG) BY MOUTH 1 TIME A WEEK WITH A FULL GLASS OF WATER AND ON AN EMPTY STOMACH, Disp: 4 tablet, Rfl: 0   aspirin 81 MG tablet, Take 81 mg by mouth daily., Disp: , Rfl:    Atogepant (QULIPTA) 60 MG TABS, Take 1 tablet (60 mg total) by mouth daily., Disp: 90 tablet, Rfl: 3   atorvastatin (LIPITOR) 40 MG tablet, TAKE 1 TABLET(40 MG) BY MOUTH DAILY, Disp: 90 tablet, Rfl: 3   BENLYSTA 200 MG/ML SOSY, Inject 4 mLs into the skin once a week., Disp: , Rfl:     benzonatate (TESSALON) 100 MG capsule, Take 100 mg by mouth 3 (three) times daily as needed for cough., Disp: , Rfl:    Buprenorphine HCl (BELBUCA) 450 MCG FILM, 1 film Bucally every 12 hrs for 30 days, Disp: , Rfl:    Cholecalciferol (VITAMIN D PO), Take 2,000 Int'l Units by mouth daily. , Disp: , Rfl:    denosumab (PROLIA) 60 MG/ML SOSY injection, Inject 60 mg into the skin every 6 (six) months., Disp: , Rfl:    diclofenac sodium (VOLTAREN) 1 % GEL, Apply topically., Disp: , Rfl:    FERREX 150 150 MG capsule, Take 1 tablet by mouth daily., Disp: , Rfl: 1   folic acid (FOLVITE) 400 MCG tablet, Take 400 mcg by mouth daily., Disp: , Rfl:    hydrALAZINE (APRESOLINE) 25 MG tablet, TAKE 1 TABLET(25 MG) BY MOUTH TWICE DAILY, Disp: 90 tablet, Rfl: 3   hydroxychloroquine (PLAQUENIL) 200 MG tablet, Take 200 mg by mouth 2 (two) times daily. , Disp: , Rfl: 0   L-Methylfolate-B6-B12 (METANX PO), Take 1 tablet by mouth 2 (two) times daily. , Disp: , Rfl:    loratadine (CLARITIN) 10 MG tablet, Take 10 mg by mouth daily., Disp: , Rfl:  LORazepam (ATIVAN) 0.5 MG tablet, Take 0.5 mg by mouth daily as needed., Disp: , Rfl:    losartan (COZAAR) 100 MG tablet, Take 100 mg by mouth daily., Disp: , Rfl:    magnesium oxide (MAG-OX) 400 (240 Mg) MG tablet, TAKE 1 TABLET(400 MG) BY MOUTH DAILY, Disp: 90 tablet, Rfl: 3   Multiple Vitamin (MULTIVITAMIN) tablet, Take 1 tablet by mouth daily., Disp: , Rfl:    mupirocin ointment (BACTROBAN) 2 %, Apply 1 Application topically 2 (two) times daily., Disp: 30 g, Rfl: 2   nabumetone (RELAFEN) 500 MG tablet, Take 500 mg by mouth daily., Disp: , Rfl:    naratriptan (AMERGE) 2.5 MG tablet, Take 1 tablet (2.5 mg total) by mouth as needed for migraine. Take one (1) tablet at onset of headache; if returns or does not resolve, may repeat after 4 hours; do not exceed five (5) mg in 24 hours., Disp: 10 tablet, Rfl: 11   NYSTATIN powder, APPLY 1 APPLICATION TOPICALLY TWICE DAILY, Disp: 15  g, Rfl: 1   Omega-3 Fatty Acids (FISH OIL) 1000 MG CAPS, Take 2 capsules by mouth daily., Disp: , Rfl:    omeprazole (PRILOSEC) 40 MG capsule, Take 1 capsule (40 mg total) by mouth in the morning and at bedtime., Disp: 60 capsule, Rfl: 5   ondansetron (ZOFRAN-ODT) 4 MG disintegrating tablet, DISSOLVE 1 TABLET ON THE TONGUE EVERY 8 HOURS AS NEEDED FOR NAUSEA OR VOMITING, Disp: 20 tablet, Rfl: 11   potassium chloride SA (KLOR-CON M) 20 MEQ tablet, TAKE 1 TABLET(20 MEQ) BY MOUTH TWICE DAILY, Disp: 180 tablet, Rfl: 3   predniSONE (DELTASONE) 20 MG tablet, Take 20 mg by mouth daily with breakfast., Disp: , Rfl:    pregabalin (LYRICA) 200 MG capsule, Take 200 mg by mouth daily., Disp: , Rfl:    PREVIDENT 5000 DRY MOUTH 1.1 % GEL dental gel, Place onto teeth as directed., Disp: , Rfl:    Rimegepant Sulfate (NURTEC) 75 MG TBDP, Take 75 mg by mouth daily as needed (take for abortive therapy of migraine, no more than 1 tablet in 24 hours or 8 per month)., Disp: 8 tablet, Rfl: 11   sertraline (ZOLOFT) 100 MG tablet, Take 1 tablet by mouth daily., Disp: , Rfl:    sertraline (ZOLOFT) 25 MG tablet, Take by mouth with 100 mg tablet for total of 125 mg at bedtime., Disp: , Rfl:    solifenacin (VESICARE) 5 MG tablet, Take 5 mg by mouth daily., Disp: , Rfl:    Suvorexant (BELSOMRA) 20 MG TABS, Take 20 mg by mouth Nightly. Take one tablet at bedtime daily, Disp: , Rfl:    tiZANidine (ZANAFLEX) 4 MG tablet, 1 tablet Orally Up to TID for 30 days, Disp: , Rfl:    tolterodine (DETROL LA) 4 MG 24 hr capsule, Take 4 mg by mouth daily., Disp: , Rfl:    torsemide (DEMADEX) 20 MG tablet, TAKE 2 TABLETS(40 MG) BY MOUTH TWICE DAILY, Disp: 360 tablet, Rfl: 3   XIIDRA 5 % SOLN, Place 1 drop into both eyes 2 (two) times daily., Disp: , Rfl:   Social History   Tobacco Use  Smoking Status Never  Smokeless Tobacco Never    Allergies  Allergen Reactions   Ubrogepant Swelling    Lip swelling    Benazepril     Lip swelling    Cymbalta [Duloxetine Hcl]     Lip swelling   Hygroton [Chlorthalidone] Swelling    Swelling in lips   Mirabegron Itching  Other reaction(s): rash   Other     Other reaction(s): Unknown   Duloxetine Rash    Lip swelling   Objective:  There were no vitals filed for this visit. There is no height or weight on file to calculate BMI. Constitutional Well developed. Well nourished.  Vascular Dorsalis pedis pulses palpable bilaterally. Posterior tibial pulses palpable bilaterally. Capillary refill normal to all digits.  No cyanosis or clubbing noted. Pedal hair growth normal.  Neurologic Normal speech. Oriented to person, place, and time. Epicritic sensation to light touch grossly present bilaterally.  Dermatologic Nails well groomed and normal in appearance. No open wounds. No skin lesions.  Orthopedic: Left extensor tendinitis noted pain with resisted dorsiflexion of the toes no pain with plantarflexion of the toe.   Radiographs: None Assessment:   1. Extensor tendinitis of foot    Plan:  Patient was evaluated and treated and all questions answered.  Left extensor tendinitis -I explained to patient the etiology of tendinitis emergency room and options were discussed.  Given the amount of pain that she is having she will benefit from cam boot immobilization allow the soft tissue structure to completely heal.  I discussed this with the patient she states understanding.   No follow-ups on file.

## 2022-11-29 ENCOUNTER — Other Ambulatory Visit: Payer: Self-pay | Admitting: Nurse Practitioner

## 2022-11-29 DIAGNOSIS — B372 Candidiasis of skin and nail: Secondary | ICD-10-CM

## 2022-11-30 MED ORDER — DENOSUMAB 60 MG/ML ~~LOC~~ SOSY
60.0000 mg | PREFILLED_SYRINGE | Freq: Once | SUBCUTANEOUS | Status: AC
  Administered 2022-12-13: 60 mg via SUBCUTANEOUS

## 2022-11-30 NOTE — Telephone Encounter (Signed)
Deductible:  $600 ($600 met)   OOP MAX: $240 met of $240   Annual exam: 10-24-21 TW, scheduled 01-30-23  Calcium:       9.2     Date: 04-05-22  Upcoming dental procedures: No   Hx of Kidney Disease: no- lupus   Last Bone Density Scan: 11-08-21  Is Prior Authorization needed: no per [facility] portal   Pt estimated Cost: $0- patient is dual coverage- bill first, have patient sign waiver day of appointment.   Spoke with patient, benefits reviewed patient scheduled for 12-13-22 at 0815. Order for prolia placed. Summary of benefits scanned into Epic.   Encounter closed.

## 2022-11-30 NOTE — Telephone Encounter (Signed)
Med refill request: Nystop Powder Last AEX: 10/24/2021 Next AEX: 01/30/2023 Last MMG (if hormonal med): n/a Refill authorized: rx pend

## 2022-12-13 ENCOUNTER — Ambulatory Visit (INDEPENDENT_AMBULATORY_CARE_PROVIDER_SITE_OTHER): Payer: 59

## 2022-12-13 DIAGNOSIS — M810 Age-related osteoporosis without current pathological fracture: Secondary | ICD-10-CM | POA: Diagnosis not present

## 2022-12-20 ENCOUNTER — Ambulatory Visit (INDEPENDENT_AMBULATORY_CARE_PROVIDER_SITE_OTHER): Payer: 59 | Admitting: Podiatry

## 2022-12-20 DIAGNOSIS — M778 Other enthesopathies, not elsewhere classified: Secondary | ICD-10-CM

## 2022-12-20 NOTE — Progress Notes (Signed)
Subjective:  Patient ID: Marie Peterson, female    DOB: Sep 05, 1965,  MRN: [id number]  Chief Complaint  Patient presents with   Extensor tendinitis of foot    Pt stated that the boot has helped a lot she stated that when she takes it off at night she doesn't feel any discomfort  She has no concerns at this time     57 y.o. female presents with the above complaint.  Patient presents with follow-up of left extensor tendinitis.  She says she is doing a lot better does not hurt the boot helped considerably.  She still has some residual pain  Review of Systems: Negative except as noted in the HPI. Denies N/V/F/Ch.  Past Medical History:  Diagnosis Date   Anxiety    Arthritis    Atrophic vaginitis    Atypical chest pain    thought to be GERD; myoview 05/17/07-no significant ischemia; echo 07/20/11- normal EF=>55%   Baker's cyst 03/28/2012   venous doppler 03/28/12=no thrombus; baker's cyst at left popliteal fossa   Chest pain    CIN I (cervical intraepithelial neoplasia I) 1990   Degenerative disc disease, cervical 2020   severe, C3-4, 4-5, 5-6. Currently receiving injections by Dr. Jordan Likes of preferred pain management   Depression    Fibroid    Fibromyalgia    Hypertension    Lupus nephritis (HCC)    Migraines    Neuropathy    Osteopenia 09/2017   T score -2.0 improved from prior study   Premature menopause age 40   following chemotherapy for Lupus   Shingles     Current Outpatient Medications:    alendronate (FOSAMAX) 70 MG tablet, TAKE 1 TABLET(70 MG) BY MOUTH 1 TIME A WEEK WITH A FULL GLASS OF WATER AND ON AN EMPTY STOMACH, Disp: 4 tablet, Rfl: 0   aspirin 81 MG tablet, Take 81 mg by mouth daily., Disp: , Rfl:    Atogepant (QULIPTA) 60 MG TABS, Take 1 tablet (60 mg total) by mouth daily., Disp: 90 tablet, Rfl: 3   atorvastatin (LIPITOR) 40 MG tablet, TAKE 1 TABLET(40 MG) BY MOUTH DAILY, Disp: 90 tablet, Rfl: 3   BENLYSTA 200 MG/ML SOSY, Inject 4 mLs into the skin once a week.,  Disp: , Rfl:    benzonatate (TESSALON) 100 MG capsule, Take 100 mg by mouth 3 (three) times daily as needed for cough., Disp: , Rfl:    Buprenorphine HCl (BELBUCA) 450 MCG FILM, 1 film Bucally every 12 hrs for 30 days, Disp: , Rfl:    Cholecalciferol (VITAMIN D PO), Take 2,000 Int'l Units by mouth daily. , Disp: , Rfl:    denosumab (PROLIA) 60 MG/ML SOSY injection, Inject 60 mg into the skin every 6 (six) months., Disp: , Rfl:    diclofenac sodium (VOLTAREN) 1 % GEL, Apply topically., Disp: , Rfl:    FERREX 150 150 MG capsule, Take 1 tablet by mouth daily., Disp: , Rfl: 1   folic acid (FOLVITE) 400 MCG tablet, Take 400 mcg by mouth daily., Disp: , Rfl:    hydrALAZINE (APRESOLINE) 25 MG tablet, TAKE 1 TABLET(25 MG) BY MOUTH TWICE DAILY, Disp: 90 tablet, Rfl: 3   hydroxychloroquine (PLAQUENIL) 200 MG tablet, Take 200 mg by mouth 2 (two) times daily. , Disp: , Rfl: 0   L-Methylfolate-B6-B12 (METANX PO), Take 1 tablet by mouth 2 (two) times daily. , Disp: , Rfl:    loratadine (CLARITIN) 10 MG tablet, Take 10 mg by mouth daily., Disp: , Rfl:  LORazepam (ATIVAN) 0.5 MG tablet, Take 0.5 mg by mouth daily as needed., Disp: , Rfl:    losartan (COZAAR) 100 MG tablet, Take 100 mg by mouth daily., Disp: , Rfl:    magnesium oxide (MAG-OX) 400 (240 Mg) MG tablet, TAKE 1 TABLET(400 MG) BY MOUTH DAILY, Disp: 90 tablet, Rfl: 3   Multiple Vitamin (MULTIVITAMIN) tablet, Take 1 tablet by mouth daily., Disp: , Rfl:    mupirocin ointment (BACTROBAN) 2 %, Apply 1 Application topically 2 (two) times daily., Disp: 30 g, Rfl: 2   nabumetone (RELAFEN) 500 MG tablet, Take 500 mg by mouth daily., Disp: , Rfl:    naratriptan (AMERGE) 2.5 MG tablet, Take 1 tablet (2.5 mg total) by mouth as needed for migraine. Take one (1) tablet at onset of headache; if returns or does not resolve, may repeat after 4 hours; do not exceed five (5) mg in 24 hours., Disp: 10 tablet, Rfl: 11   NYSTATIN powder, APPLY 1 APPLICATION TOPICALLY TWICE  DAILY, Disp: 15 g, Rfl: 1   Omega-3 Fatty Acids (FISH OIL) 1000 MG CAPS, Take 2 capsules by mouth daily., Disp: , Rfl:    omeprazole (PRILOSEC) 40 MG capsule, Take 1 capsule (40 mg total) by mouth in the morning and at bedtime., Disp: 60 capsule, Rfl: 5   ondansetron (ZOFRAN-ODT) 4 MG disintegrating tablet, DISSOLVE 1 TABLET ON THE TONGUE EVERY 8 HOURS AS NEEDED FOR NAUSEA OR VOMITING, Disp: 20 tablet, Rfl: 11   potassium chloride SA (KLOR-CON M) 20 MEQ tablet, TAKE 1 TABLET(20 MEQ) BY MOUTH TWICE DAILY, Disp: 180 tablet, Rfl: 3   predniSONE (DELTASONE) 20 MG tablet, Take 20 mg by mouth daily with breakfast., Disp: , Rfl:    pregabalin (LYRICA) 200 MG capsule, Take 200 mg by mouth daily., Disp: , Rfl:    PREVIDENT 5000 DRY MOUTH 1.1 % GEL dental gel, Place onto teeth as directed., Disp: , Rfl:    Rimegepant Sulfate (NURTEC) 75 MG TBDP, Take 75 mg by mouth daily as needed (take for abortive therapy of migraine, no more than 1 tablet in 24 hours or 8 per month)., Disp: 8 tablet, Rfl: 11   sertraline (ZOLOFT) 100 MG tablet, Take 1 tablet by mouth daily., Disp: , Rfl:    sertraline (ZOLOFT) 25 MG tablet, Take by mouth with 100 mg tablet for total of 125 mg at bedtime., Disp: , Rfl:    solifenacin (VESICARE) 5 MG tablet, Take 5 mg by mouth daily., Disp: , Rfl:    Suvorexant (BELSOMRA) 20 MG TABS, Take 20 mg by mouth Nightly. Take one tablet at bedtime daily, Disp: , Rfl:    tiZANidine (ZANAFLEX) 4 MG tablet, 1 tablet Orally Up to TID for 30 days, Disp: , Rfl:    tolterodine (DETROL LA) 4 MG 24 hr capsule, Take 4 mg by mouth daily., Disp: , Rfl:    torsemide (DEMADEX) 20 MG tablet, TAKE 2 TABLETS(40 MG) BY MOUTH TWICE DAILY, Disp: 360 tablet, Rfl: 3   XIIDRA 5 % SOLN, Place 1 drop into both eyes 2 (two) times daily., Disp: , Rfl:   Social History   Tobacco Use  Smoking Status Never  Smokeless Tobacco Never    Allergies  Allergen Reactions   Ubrogepant Swelling    Lip swelling    Benazepril      Lip swelling   Cymbalta [Duloxetine Hcl]     Lip swelling   Hygroton [Chlorthalidone] Swelling    Swelling in lips   Mirabegron Itching  Other reaction(s): rash   Other     Other reaction(s): Unknown   Duloxetine Rash    Lip swelling   Objective:  There were no vitals filed for this visit. There is no height or weight on file to calculate BMI. Constitutional Well developed. Well nourished.  Vascular Dorsalis pedis pulses palpable bilaterally. Posterior tibial pulses palpable bilaterally. Capillary refill normal to all digits.  No cyanosis or clubbing noted. Pedal hair growth normal.  Neurologic Normal speech. Oriented to person, place, and time. Epicritic sensation to light touch grossly present bilaterally.  Dermatologic Nails well groomed and normal in appearance. No open wounds. No skin lesions.  Orthopedic: Left extensor tendinitis noted pain with resisted dorsiflexion of the toes no pain with plantarflexion of the toe.   Radiographs: None Assessment:   1. Extensor tendinitis of foot     Plan:  Patient was evaluated and treated and all questions answered.  Left extensor tendinitis -I explained to patient the etiology of tendinitis emergency room and options were discussed.  Her mood has improved considerably.  She can discontinue the boot.  Given that she still has some residual pain she would benefit from a steroid injection because of inflammatory component surgical pain.  I discussed the risk of rupture associate with the.  She states understanding like proceed despite the risks. -A steroid injection was performed at left dorsal foot using 1% plain Lidocaine and 10 mg of Kenalog. This was well tolerated.     No follow-ups on file.

## 2023-01-01 NOTE — Patient Instructions (Incomplete)

## 2023-01-01 NOTE — Progress Notes (Unsigned)
No chief complaint on file.   HISTORY OF PRESENT ILLNESS:  01/01/23 ALL:  Marie Peterson returns for follow up for migraines. She continues Qulipta for prevention and Nurtec and or naratriptan for abortive therapy.   01/02/2022 ALL:  Marie Peterson returns for migraine follow up. She continues Freight forwarder. Nurtec used for intractable headaches. She reports doing well. She may have 4-6 migraine days a month. Abortive meds work well. She is followed by PCP regularly. She has multiple specialists as well. She denies history of bradycardia but there is history in chart. She was recently started on Belbuca for pain management. She denies palpitations, dizziness or lightheadedness. She is followed by cardiology for HTN.   01/19/2022 ALL: Marie Peterson returns for follow up for migraines. We started her on Qulipta due to breakthrough migraines on Ajovy. She does feel that Nurtec and naratriptan work well. She may have 8-12 headache days a month but only 2-3 are severe. She takes either Nurtec or naratriptan and usually migraine is easily aborted. She continues to see PCP and psychiatry regularly.   07/19/2020 ALL:  She returns for follow up for migraines. She continues Ajovy injections. She feels that these have helped significantly. She does feel that Ajovy wears off towards the end of the cycle (getting 3inj/84mths). She has about 5-6 migraines a month. Amerge works well for abortive therapy. Gabapentin was switched Lyrica. She is now taking 200mg  BID. Insurance no longer covers Ajovy. She is requesting to switch to oral CGRP due to fears of injections.   01/15/2020 ALL:  Marie Peterson is a 57 y.o. female here today for follow up for migraines. Sleep eval was normal. She continues  Ajovy and gabapentin 600/600/1200. She was started on Effexor by psychiatry but had an allergic reaction. She is now taking sertraline. She continues Nurtec for abortive therapy and feels that it helps with pain but does not abort migraine. She  continues to have daily tension headaches. She has about 8 migraines per month. She admits stress is contributor. She is followed closely by Tamela Oddi. She has chronic pain on oxycodone TID.   HISTORY (copied from Dr Oliva Bustard note on 09/11/2019)  Marie Peterson is a 57  year-old [place] or [place] American female patient established with Dr. Lucia Gaskins and seen here upon a referral on 09/11/2019 from Dr Selena Batten for sleep disordered breathing. Chief concern according to patient :   Mrs. Odelia Gage reports that her daughter has slept for a while at her house and noticed her to twitch and move during her sleep may be more of a trembling.  She also noted irregular breathing and pauses in her sleep breathing.  For this reason she is referred for evaluation of possible sleep apnea.  Dr. Pearson Grippe referred.   I reviewed the patient's medication she is on Percocet, she is still on oxycodone.   Prednisone, amlodipine Benzapril, alendronate, omeprazole, Tessalon Perles as needed furosemide 40 mg actually 1.5 tablets in the morning and 1 in the night, hydralazine twice a day, no tach, Belsomra 20 mg Trintellix 10 mg not also on gabapentin 3 times daily. A lot of medications with sleep inducing or sleep changing potential.   Marie Peterson is a right -handed Black or [place] American female with a possible sleep disorder.  She has a past medical history of Lupus, Arthritis, Atrophic vaginitis, Atypical chest pain, Baker's cyst (03/28/12), Chest pain, CIN I (cervical intraepithelial neoplasia I) (1990), Degenerative disc disease, cervical (2020), Fibroid, "Fibromyalgia", Hypertension, Lupus  nephritis (HCC), Migraines, Neuropathy, Osteopenia (09/2017), Premature menopause (age 61), and Shingles.    Sleep relevant medical history: Nocturia- diuretic induced 3-4 times , No Sleep walking, sinus surgery    Family medical /sleep history: niece is another family member on CPAP with OSA.   Social history:  Patient is disabled  / retired  from Therapist, nutritional  and lives in a household with her daughter. Family status is separated. Tobacco use- none .  ETOH use ;none , Caffeine intake in form of Coffee( 1 cup daily  Soda( none) Tea ( rare ) nor energy drinks. Regular exercise - none .   Sleep habits are as follows: The patient's dinner time is between 6 PM. The patient goes to bed at variable times , 10-11 PM and struggles to sleep-once asleep continues to sleep for 1-3 hours, wakes for many bathroom breaks.   The preferred sleep position is supine of on her side, with the support of 2 pillows. She sleeps with a boot, plantar fasciitis.   Dreams are reportedly frequent.   8.30 AM is the usual rise time. The patient wakes up spontaneously between 4.30 and 5 AM , but tries to get more sleep.   She reports not feeling refreshed or restored in AM, with symptoms such as dry mouth , morning headaches , and residual fatigue. Naps are taken frequently, lasting from 1-2 hours , feels these are less refreshing than nocturnal sleep. She reportedly yawns al through the day.    "Addendum Kaisen Ackers/ Dr Lucia Gaskins have followed for Migraine and here is the last note from 03-11-2019: Marie Peterson is a 57 y.o. female here today for follow up. She is under more stress recently with her dad's new diagnosis of prostate and brain cancer. She is one of 12 children and feels that they all look to her for support. She has noticed headaches are worse. Same headache but more frequent. She continues to do well with Ajovy every three months, gabapentin 600mg  three times daily and Nurtec for abortive therapy. BP has been elevated. She is currently separated from her husband. She is followed by Tamela Oddi. Effexor was recently switched to Trintellix. She feels that Trintellix is making her sick on her stomach. She is followed closely by  PCP as well. She is taking hydralazine, amlodipine and benazepril for HTN. She is on chronic opioids for pain management. "  History  (copied from my note on 03/11/2019)  Marie Peterson is a 57 y.o. female here today for follow up. She is under more stress recently with her dad's new diagnosis of prostate and brain cancer. She is one of 12 children and feels that they all look to her for support. She has noticed headaches are worse. Same headache but more frequent. She continues to do well with Ajovy every three months, gabapentin 600mg  three times daily and Nurtec for abortive therapy. BP has been elevated. She is currently separated from her husband. She is followed by Tamela Oddi. Effexor was recently switched to Trintellix. She feels that Trintellix is making her sick on her stomach. She is followed closely by  PCP as well. She is taking hydralazine, amlodipine and benazepril for HTN. She is on chronic opioids for pain management.    HISTORY: (copied from my note on 10/31/2018)   Marie Peterson is a 57 y.o. female here today for follow up. She has had an increase in migraines. She is having daily headaches. She reports at least 2-3 migrainous  headaches each week with light/sound sensitivity and nausea. Heat and stress make headaches worse. She is going through a separation currently and has noticed migraines are worse over the past month.  She is using Ajovy injections (3injections every three months). She is taking gabapentin 600mg  TID for neuropathy. She is taking Percocet 7.5/325mg  for severe DDD prescribed by PCP. She is also seeing rheumatology for Lupus.  She uses Zomig for abortive therapy that does ease up the headache. She occasionally takes Zofran for nausea with migraine.    She is taking Belsomra for sleep. She does not feel it is helping her. She continues to wake frequently throughout the night. She does not snore. She is admittedly more depressed. She has not spoken with PCP due to him being out of the office for medical leave. She is not taking any antidepressants at this time. She does not remember being on anything in the  past with the exception of Cymbalta for pain. This medications did cause mild lip swelling. No respiratory involvement , no swollen tongue or airway. No trouble breathing.    REVIEW OF SYSTEMS: Out of a complete 14 system review of symptoms, the patient complains only of the following symptoms, headaches, chronic pain, anxiety and depression and all other reviewed systems are negative.   ALLERGIES: Allergies  Allergen Reactions   Ubrogepant Swelling    Lip swelling    Benazepril     Lip swelling   Cymbalta [Duloxetine Hcl]     Lip swelling   Hygroton [Chlorthalidone] Swelling    Swelling in lips   Mirabegron Itching    Other reaction(s): rash   Other     Other reaction(s): Unknown   Duloxetine Rash    Lip swelling     HOME MEDICATIONS: Outpatient Medications Prior to Visit  Medication Sig Dispense Refill   alendronate (FOSAMAX) 70 MG tablet TAKE 1 TABLET(70 MG) BY MOUTH 1 TIME A WEEK WITH A FULL GLASS OF WATER AND ON AN EMPTY STOMACH 4 tablet 0   aspirin 81 MG tablet Take 81 mg by mouth daily.     Atogepant (QULIPTA) 60 MG TABS Take 1 tablet (60 mg total) by mouth daily. 90 tablet 3   atorvastatin (LIPITOR) 40 MG tablet TAKE 1 TABLET(40 MG) BY MOUTH DAILY 90 tablet 3   BENLYSTA 200 MG/ML SOSY Inject 4 mLs into the skin once a week.     benzonatate (TESSALON) 100 MG capsule Take 100 mg by mouth 3 (three) times daily as needed for cough.     Buprenorphine HCl (BELBUCA) 450 MCG FILM 1 film Bucally every 12 hrs for 30 days     Cholecalciferol (VITAMIN D PO) Take 2,000 Int'l Units by mouth daily.      denosumab (PROLIA) 60 MG/ML SOSY injection Inject 60 mg into the skin every 6 (six) months.     diclofenac sodium (VOLTAREN) 1 % GEL Apply topically.     FERREX 150 150 MG capsule Take 1 tablet by mouth daily.  1   folic acid (FOLVITE) 400 MCG tablet Take 400 mcg by mouth daily.     hydrALAZINE (APRESOLINE) 25 MG tablet TAKE 1 TABLET(25 MG) BY MOUTH TWICE DAILY 90 tablet 3    hydroxychloroquine (PLAQUENIL) 200 MG tablet Take 200 mg by mouth 2 (two) times daily.   0   L-Methylfolate-B6-B12 (METANX PO) Take 1 tablet by mouth 2 (two) times daily.      loratadine (CLARITIN) 10 MG tablet Take 10 mg by mouth daily.  LORazepam (ATIVAN) 0.5 MG tablet Take 0.5 mg by mouth daily as needed.     losartan (COZAAR) 100 MG tablet Take 100 mg by mouth daily.     magnesium oxide (MAG-OX) 400 (240 Mg) MG tablet TAKE 1 TABLET(400 MG) BY MOUTH DAILY 90 tablet 3   Multiple Vitamin (MULTIVITAMIN) tablet Take 1 tablet by mouth daily.     mupirocin ointment (BACTROBAN) 2 % Apply 1 Application topically 2 (two) times daily. 30 g 2   nabumetone (RELAFEN) 500 MG tablet Take 500 mg by mouth daily.     naratriptan (AMERGE) 2.5 MG tablet Take 1 tablet (2.5 mg total) by mouth as needed for migraine. Take one (1) tablet at onset of headache; if returns or does not resolve, may repeat after 4 hours; do not exceed five (5) mg in 24 hours. 10 tablet 11   NYSTATIN powder APPLY 1 APPLICATION TOPICALLY TWICE DAILY 15 g 1   Omega-3 Fatty Acids (FISH OIL) 1000 MG CAPS Take 2 capsules by mouth daily.     omeprazole (PRILOSEC) 40 MG capsule Take 1 capsule (40 mg total) by mouth in the morning and at bedtime. 60 capsule 5   ondansetron (ZOFRAN-ODT) 4 MG disintegrating tablet DISSOLVE 1 TABLET ON THE TONGUE EVERY 8 HOURS AS NEEDED FOR NAUSEA OR VOMITING 20 tablet 11   potassium chloride SA (KLOR-CON M) 20 MEQ tablet TAKE 1 TABLET(20 MEQ) BY MOUTH TWICE DAILY 180 tablet 3   predniSONE (DELTASONE) 20 MG tablet Take 20 mg by mouth daily with breakfast.     pregabalin (LYRICA) 200 MG capsule Take 200 mg by mouth daily.     PREVIDENT 5000 DRY MOUTH 1.1 % GEL dental gel Place onto teeth as directed.     Rimegepant Sulfate (NURTEC) 75 MG TBDP Take 75 mg by mouth daily as needed (take for abortive therapy of migraine, no more than 1 tablet in 24 hours or 8 per month). 8 tablet 11   sertraline (ZOLOFT) 100 MG tablet  Take 1 tablet by mouth daily.     sertraline (ZOLOFT) 25 MG tablet Take by mouth with 100 mg tablet for total of 125 mg at bedtime.     solifenacin (VESICARE) 5 MG tablet Take 5 mg by mouth daily.     Suvorexant (BELSOMRA) 20 MG TABS Take 20 mg by mouth Nightly. Take one tablet at bedtime daily     tiZANidine (ZANAFLEX) 4 MG tablet 1 tablet Orally Up to TID for 30 days     tolterodine (DETROL LA) 4 MG 24 hr capsule Take 4 mg by mouth daily.     torsemide (DEMADEX) 20 MG tablet TAKE 2 TABLETS(40 MG) BY MOUTH TWICE DAILY 360 tablet 3   XIIDRA 5 % SOLN Place 1 drop into both eyes 2 (two) times daily.     No facility-administered medications prior to visit.     PAST MEDICAL HISTORY: Past Medical History:  Diagnosis Date   Anxiety    Arthritis    Atrophic vaginitis    Atypical chest pain    thought to be GERD; myoview 05/17/07-no significant ischemia; echo 07/20/11- normal EF=>55%   Baker's cyst 03/28/2012   venous doppler 03/28/12=no thrombus; baker's cyst at left popliteal fossa   Chest pain    CIN I (cervical intraepithelial neoplasia I) 1990   Degenerative disc disease, cervical 2020   severe, C3-4, 4-5, 5-6. Currently receiving injections by Dr. Jordan Likes of preferred pain management   Depression    Fibroid  Fibromyalgia    Hypertension    Lupus nephritis (HCC)    Migraines    Neuropathy    Osteopenia 09/2017   T score -2.0 improved from prior study   Premature menopause age 89   following chemotherapy for Lupus   Shingles      PAST SURGICAL HISTORY: Past Surgical History:  Procedure Laterality Date   cervical fossa injections  2020   CESAREAN SECTION     COLPOSCOPY     HYSTEROSCOPY     Myomectomy   MYOMECTOMY     Hysteroscopic myomectomy   NASAL SINUS SURGERY  01/2016   thumb surg  07/07/2019   TUBAL LIGATION     WRIST SURGERY Right 06/21/2020     FAMILY HISTORY: Family History  Problem Relation Age of Onset   Hypertension Mother    Asthma Mother    COPD  Mother    Diabetes Father    Hypertension Father    Heart disease Father    Cancer Father        prostate     SOCIAL HISTORY: Social History   Socioeconomic History   Marital status: Married    Spouse name: Thereasa Distance   Number of children: 2   Years of education: Automotive engineer   Highest education level: Not on file  Occupational History    Employer: DISABLED    Comment: disability  Tobacco Use   Smoking status: Never   Smokeless tobacco: Never  Vaping Use   Vaping status: Never Used  Substance and Sexual Activity   Alcohol use: No    Alcohol/week: 0.0 standard drinks of alcohol   Drug use: No   Sexual activity: Not Currently    Birth control/protection: Surgical, Post-menopausal    Comment: Tubal lig-1st intercourse 57 yo-Fewer than 5 partners,  Other Topics Concern   Not on file  Social History Narrative   Patient lives at home with her daughter. She is currently separated (not legally).   Caffeine Use: 1 cup of coffee daily.    Right-handed   Social Determinants of Corporate investment banker Strain: Not on file  Food Insecurity: Not on file  Transportation Needs: Not on file  Physical Activity: Not on file  Stress: Not on file  Social Connections: Not on file  Intimate Partner Violence: Not on file      PHYSICAL EXAM  There were no vitals filed for this visit.    There is no height or weight on file to calculate BMI.   Generalized: Well developed, in no acute distress   Neurological examination  Mentation: Alert oriented to time, place, history taking. Follows all commands speech and language fluent Cranial nerve II-XII: Pupils were equal round reactive to light. Extraocular movements were full, visual field were full  Motor: The motor testing reveals 5 over 5 strength of all 4 extremities. Good symmetric motor tone is noted throughout.  Gait and station: Gait is normal.      DIAGNOSTIC DATA (LABS, IMAGING, TESTING) - I reviewed patient records, labs,  notes, testing and imaging myself where available.  Lab Results  Component Value Date   WBC 5.4 12/17/2020   HGB 13.5 12/17/2020   HCT 40.2 12/17/2020   MCV 86 12/17/2020   PLT 242 12/17/2020      Component Value Date/Time   NA 142 08/17/2021 0855   NA 142 07/01/2013 1104   K 3.9 08/17/2021 0855   K 4.2 07/01/2013 1104   CL 100 08/17/2021 0855  CO2 31 (H) 08/17/2021 0855   CO2 26 07/01/2013 1104   GLUCOSE 88 08/17/2021 0855   GLUCOSE 61 (L) 09/10/2015 0833   GLUCOSE 94 07/01/2013 1104   BUN 25 (H) 08/17/2021 0855   BUN 12.7 07/01/2013 1104   CREATININE 1.00 11/23/2021 1049   CREATININE 0.9 07/01/2013 1104   CALCIUM 9.0 11/23/2021 1049   CALCIUM 9.5 07/01/2013 1104   PROT 6.7 03/24/2021 0852   PROT 6.7 07/01/2013 1104   ALBUMIN 4.0 03/24/2021 0852   ALBUMIN 3.9 07/01/2013 1104   AST 35 03/24/2021 0852   AST 47 (H) 07/01/2013 1104   ALT 23 03/24/2021 0852   ALT 36 07/01/2013 1104   ALKPHOS 146 (H) 03/24/2021 0852   ALKPHOS 80 07/01/2013 1104   BILITOT <0.2 03/24/2021 0852   BILITOT 0.48 07/01/2013 1104   GFRNONAA 83 05/18/2020 1024   GFRAA 96 05/18/2020 1024   Lab Results  Component Value Date   CHOL 135 03/24/2021   HDL 64 03/24/2021   LDLCALC 59 03/24/2021   TRIG 54 03/24/2021   CHOLHDL 2.1 03/24/2021   Lab Results  Component Value Date   HGBA1C 5.4 09/10/2018   Lab Results  Component Value Date   VITAMINB12 >2000 (H) 09/10/2018   Lab Results  Component Value Date   TSH 1.27 09/10/2015      ASSESSMENT AND PLAN  57 y.o. year old female  has a past medical history of Anxiety, Arthritis, Atrophic vaginitis, Atypical chest pain, Baker's cyst (03/28/2012), Chest pain, CIN I (cervical intraepithelial neoplasia I) (1990), Degenerative disc disease, cervical (2020), Depression, Fibroid, Fibromyalgia, Hypertension, Lupus nephritis (HCC), Migraines, Neuropathy, Osteopenia (09/2017), Premature menopause (age 21), and Shingles. here with   No diagnosis  found.   Zoei is doing well. She is tolerating medications. We will continue Qulipta 60mg  daily and either Nurtec or naratriptan for abortive therapy. May combine Nurtec and naratriptan with Tylenol, Benadryl and Nsaid for intractable migraines. She will continue close follow up with PCP and specialist. She will monitor for symptoms of bradycardia. Healthy lifestyle habits encouraged. She will follow up with me in 1 year.    Shawnie Dapper, MSN, FNP-C 01/01/2023, 8:02 AM  [facility] [address], [address] [place], [place] [address] [phone]

## 2023-01-03 ENCOUNTER — Encounter: Payer: Self-pay | Admitting: Family Medicine

## 2023-01-03 ENCOUNTER — Ambulatory Visit (INDEPENDENT_AMBULATORY_CARE_PROVIDER_SITE_OTHER): Payer: 59 | Admitting: Family Medicine

## 2023-01-03 VITALS — BP 98/53 | HR 54 | Ht 64.0 in | Wt 230.5 lb

## 2023-01-03 DIAGNOSIS — G43009 Migraine without aura, not intractable, without status migrainosus: Secondary | ICD-10-CM | POA: Diagnosis not present

## 2023-01-03 MED ORDER — EMGALITY 120 MG/ML ~~LOC~~ SOAJ: 120.0000 mg | SUBCUTANEOUS | 0 refills | Status: DC

## 2023-01-03 MED ORDER — UBRELVY 100 MG PO TABS: 100.0000 mg | ORAL_TABLET | Freq: Every day | ORAL | 11 refills | Status: DC | PRN

## 2023-01-03 MED ORDER — NARATRIPTAN HCL 2.5 MG PO TABS: 2.5000 mg | ORAL_TABLET | ORAL | 11 refills | Status: AC | PRN

## 2023-01-03 MED ORDER — EMGALITY 120 MG/ML ~~LOC~~ SOAJ: 120.0000 mg | SUBCUTANEOUS | 3 refills | Status: DC

## 2023-01-08 ENCOUNTER — Encounter: Payer: Self-pay | Admitting: Cardiovascular Disease

## 2023-01-08 ENCOUNTER — Encounter: Payer: Self-pay | Admitting: Family Medicine

## 2023-01-08 DIAGNOSIS — I10 Essential (primary) hypertension: Secondary | ICD-10-CM

## 2023-01-08 DIAGNOSIS — I499 Cardiac arrhythmia, unspecified: Secondary | ICD-10-CM

## 2023-01-08 DIAGNOSIS — R001 Bradycardia, unspecified: Secondary | ICD-10-CM

## 2023-01-09 MED ORDER — ONDANSETRON 4 MG PO TBDP: 4.0000 mg | ORAL_TABLET | Freq: Three times a day (TID) | ORAL | 11 refills | Status: AC | PRN

## 2023-01-10 ENCOUNTER — Telehealth: Payer: Self-pay

## 2023-01-10 NOTE — Telephone Encounter (Signed)
Called pharmacy and confirmed that pt is supposed to get the 2ml started dose with the 2 syringes.

## 2023-01-18 ENCOUNTER — Ambulatory Visit: Payer: 59 | Attending: Cardiovascular Disease

## 2023-01-18 DIAGNOSIS — R001 Bradycardia, unspecified: Secondary | ICD-10-CM

## 2023-01-18 DIAGNOSIS — I499 Cardiac arrhythmia, unspecified: Secondary | ICD-10-CM

## 2023-01-18 NOTE — Telephone Encounter (Signed)
Marie Gess, MD  Tobin Chad, RN3 hours ago (9:52 AM)   BPs are higher than when I saw her in the office in June. Have her keep a 30 day BP log then see a Pharm D in the office to review. Also needs a 2 week Zio patch for her IHB   Orders placed for Pharm D and Zio 14 D monitor.  Pt notified via MyChart.

## 2023-01-18 NOTE — Progress Notes (Unsigned)
Enrolled patient for a 14 day Zio XT  monitor to be mailed to patients home  °

## 2023-01-21 DIAGNOSIS — R001 Bradycardia, unspecified: Secondary | ICD-10-CM

## 2023-01-21 DIAGNOSIS — I499 Cardiac arrhythmia, unspecified: Secondary | ICD-10-CM | POA: Diagnosis not present

## 2023-01-23 ENCOUNTER — Other Ambulatory Visit (HOSPITAL_COMMUNITY): Payer: Self-pay

## 2023-01-28 ENCOUNTER — Encounter: Payer: Self-pay | Admitting: Family Medicine

## 2023-01-29 ENCOUNTER — Other Ambulatory Visit (HOSPITAL_COMMUNITY): Payer: Self-pay

## 2023-01-29 ENCOUNTER — Telehealth: Payer: Self-pay

## 2023-01-29 NOTE — Telephone Encounter (Signed)
PA request has been Submitted. New Encounter created for follow up. For additional info see Pharmacy Prior Auth telephone encounter from 01/29/2023.

## 2023-01-29 NOTE — Telephone Encounter (Signed)
Called pharmacy and Pharmasist stated that Emgality needed a PA done, sending to PA team.    Please do PA for Pt

## 2023-01-29 NOTE — Telephone Encounter (Signed)
Pharmacy Patient Advocate Encounter   Received notification from Physician's Office that prior authorization for Emgality 120MG /ML auto-injectors (migraine) is required/requested.   Insurance verification completed.   The patient is insured through [employer] .   Per test claim: PA required; PA submitted to above mentioned insurance via CoverMyMeds Key/confirmation #/EOC [id number] Status is pending  This is loading dose.

## 2023-01-29 NOTE — Telephone Encounter (Signed)
Pharmacy is closed until 10:00am

## 2023-01-30 ENCOUNTER — Encounter: Payer: Medicare Other | Admitting: Nurse Practitioner

## 2023-01-31 ENCOUNTER — Other Ambulatory Visit (HOSPITAL_COMMUNITY): Payer: Self-pay

## 2023-01-31 NOTE — Telephone Encounter (Signed)
Noted, sent mychart message to pt.

## 2023-01-31 NOTE — Telephone Encounter (Signed)
Pharmacy Patient Advocate Encounter  Received notification from [facility] that Prior Authorization for Emgality 120MG /ML auto-injectors (migraine) has been APPROVED from 01/30/2023 to 02/12/2023. Ran test claim, Copay is $0. This test claim was processed through [facility]- copay amounts may vary at other pharmacies due to pharmacy/plan contracts, or as the patient moves through the different stages of their insurance plan.   PA #/Case ID/Reference #: [id number]  2ml loading dose

## 2023-02-16 ENCOUNTER — Telehealth: Payer: Self-pay | Admitting: Cardiovascular Disease

## 2023-02-16 ENCOUNTER — Encounter: Payer: Self-pay | Admitting: Cardiovascular Disease

## 2023-02-16 NOTE — Telephone Encounter (Signed)
Forwarding to provider.

## 2023-02-16 NOTE — Telephone Encounter (Signed)
Done. Appointment made for pt. See telephone encounter for more information.

## 2023-02-16 NOTE — Telephone Encounter (Signed)
Pt returning call to nurse fro results

## 2023-02-16 NOTE — Telephone Encounter (Signed)
Appointment made with APP for 02/26/23 at 0900 to go over monitor results.

## 2023-02-19 ENCOUNTER — Ambulatory Visit (INDEPENDENT_AMBULATORY_CARE_PROVIDER_SITE_OTHER): Payer: 59 | Admitting: Nurse Practitioner

## 2023-02-19 ENCOUNTER — Other Ambulatory Visit (HOSPITAL_COMMUNITY)
Admission: RE | Admit: 2023-02-19 | Discharge: 2023-02-19 | Disposition: A | Discharge status: Home / Self Care | Payer: 59 | Source: Ambulatory Visit | Attending: Nurse Practitioner | Admitting: Nurse Practitioner

## 2023-02-19 ENCOUNTER — Encounter: Payer: Self-pay | Admitting: Nurse Practitioner

## 2023-02-19 VITALS — BP 112/84 | HR 64 | Ht 63.25 in | Wt 228.0 lb

## 2023-02-19 DIAGNOSIS — Z780 Asymptomatic menopausal state: Secondary | ICD-10-CM | POA: Diagnosis not present

## 2023-02-19 DIAGNOSIS — Z1151 Encounter for screening for human papillomavirus (HPV): Secondary | ICD-10-CM | POA: Insufficient documentation

## 2023-02-19 DIAGNOSIS — Z124 Encounter for screening for malignant neoplasm of cervix: Secondary | ICD-10-CM

## 2023-02-19 DIAGNOSIS — Z01419 Encounter for gynecological examination (general) (routine) without abnormal findings: Secondary | ICD-10-CM | POA: Insufficient documentation

## 2023-02-19 DIAGNOSIS — M810 Age-related osteoporosis without current pathological fracture: Secondary | ICD-10-CM

## 2023-02-19 NOTE — Progress Notes (Signed)
Marie Peterson 01-Feb-1966 [id number]  History:  57 y.o. G2P2002 presents for annual exam. Postmenopausal - no HRT, no bleeding. 1990 CIN-1, subsequent paps normal. On Fosamax 2017-2023, switched to Prolia 11/2021 due to length of use of Fosamax and decrease in bone mass. Frequent use of prednisone for SLE. History of SLE, HTN, uterine fibroids.   Gynecologic History No LMP recorded. Patient is postmenopausal.   Contraception/Family planning: post menopausal status Sexually active: No  Health Maintenance Last Pap: 10/20/2020. Results were: Normal, 3-year repeat Last mammogram: 07/13/2022. Results were: Normal Last colonoscopy: 2017. Results were: Normal, 10-year recall Last Dexa: 11/08/2021. Results were: T-score -2.1  Past medical history, past surgical history, family history and social history were all reviewed and documented in the EPIC chart. Separated. On disability. 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  ROS:  A ROS was performed and pertinent positives and negatives are included.  Exam:  Vitals:   02/19/23 1203  BP: 112/84  Pulse: 64  SpO2: 96%  Weight: 228 lb (103.4 kg)  Height: 5' 3.25" (1.607 m)     Body mass index is 40.07 kg/m.  General appearance:  Normal Thyroid:  Symmetrical, normal in size, without palpable masses or nodularity. Respiratory  Auscultation:  Clear without wheezing or rhonchi Cardiovascular  Auscultation:  Regular rate, without rubs, murmurs or gallops  Edema/varicosities:  Not grossly evident Abdominal  Soft,nontender, without masses, guarding or rebound.  Liver/spleen:  No organomegaly noted  Hernia:  None appreciated  Skin  Inspection:  Grossly normal Breasts: Examined lying and sitting.   Right: Without masses, retractions, nipple discharge or axillary adenopathy.   Left: Without masses, retractions, nipple discharge or axillary  adenopathy. Pelvic: External genitalia:  no lesions              Urethra:  normal appearing urethra with no masses, tenderness or lesions              Bartholins and Skenes: normal                 Vagina: normal appearing vagina with normal color and discharge, no lesions. Atrophic changes              Cervix: no lesions Bimanual Exam:  Uterus:  no masses or tenderness              Adnexa: no mass, fullness, tenderness              Rectovaginal: Deferred              Anus:  normal, no lesions  Patient informed chaperone available to be present for breast and pelvic exam. Patient has requested no chaperone to be present. Patient has been advised what will be completed during breast and pelvic exam.   Assessment/Plan:  57 y.o. G2P2002 for annual exam.   Well female exam with routine gynecological exam - Education provided on SBEs, importance of preventative screenings, current guidelines, high calcium diet, regular exercise, and multivitamin daily. Labs with PCP.   Postmenopausal - no HRT, no bleeding. Occasional hot flashes.   Age-related osteoporosis without current pathological fracture - 10/2021 T-score -2.1. On Fosamax 2017-2023, switched to Prolia 11/2021 due to length of use of Fosamax and decrease in bone mass. She does have SLE and uses prednisone often.   Cervical cancer screening - Plan: Cytology - PAP( [facility]).  1990 CIN-1, subsequent paps normal.  Screening for breast cancer - Normal mammogram history.  Continue annual screenings.  Normal breast exam today.  Screening for colon cancer - 2017 colonoscopy. Will repeat at 10-year interval per GI's recommendation.   Return in about 1 year (around 02/19/2024) for Annual.     Olivia Mackie DNP, 12:32 PM 02/19/2023

## 2023-02-24 NOTE — Progress Notes (Unsigned)
Cardiology Office Note    Date:  02/26/2023  ID:  [name], DOB 23-Oct-1965, MRN [id number] PCP:  Marie Galas, NP  Cardiologist:  Marie Batty, MD  Electrophysiologist:  None   Chief Complaint: Follow up regarding  cardiac monitor results   History of Present Illness: .    Marie Peterson is a 57 y.o. female with visit-pertinent history of atypical chest pain with nonobstructive CAD noted on coronary CTA on 12/08/2020, chronic CHF with preserved EF, sinus bradycardia noted on monitor in 2018, hypertension, hyperlipidemia, GERD, SLE with lupus nephritis, fibromyalgia, migraines, arthritis and chronic pain.  In 10/2014 she underwent ETT which was normal.  She was noted bradycardic in the fall 2018, monitor in 10/20 teen shows sinus bradycardia with rates in the 40s to 60s.  Echo showed LVEF of 60 to 65% with normal wall motion, normal diastolic function, trace MR and trace TR.  In 2022 she was seen frequently for complaints of dyspnea on exertion, lower extremity edema and weight gain.  BNP was normal.  Echo in 05/2020 showed LVEF of 60 to 65% with normal wall motion and mild LVH.  Coronary CTA on 12/08/2020 showed a coronary calcium score of 37.8 agitation units, 80th percentile for age and gender, with nonobstructive disease of LAD. Dr. Allyson Peterson felt that the majority of her symptoms were related to her rheumatologic disorders. She was last seen by Dr. Allyson Peterson on 08/29/22, she had remained stable from a cardiac perspective.  On 01/08/23 she notified the office that her neurologist had noted she had an irregular heart beat on auscultation, she denied symptoms. A two week cardiac monitor was ordered.  Her monitor showed an average heart rate of 55 bpm, ranged from 35 to 203 bpm.  Predominant underlying rhythm was sinus rhythm, she had 324 runs of supraventricular tachycardia the fastest interval lasting 12 beats with a max rate of 203 bpm, the longest lasting 11 beats with an average rate of 111 bpm, she had  frequent PACs with a 10.7% burden, SVE couplets were frequent at 6.5% occasional PVCs.  Today she presents for follow up regarding her cardiac monitor. She reports that she has been having some intermittent dizziness or lightheadedness a few times a week which she does associate with palpitations, she notes she will have feelings of skipped beats and fast heart rates.  She reports that she drinks 1 to 2 cups of coffee in the morning she does not drink alcohol, smoke or use recreational drugs.  She reports occasional shortness of breath that she feels is at her baseline can occur more with palpitations, she denies chest pain.  She endorses intermittent lower extreme edema that is overall well-controlled with her torsemide.  Labwork independently reviewed: 11/15/22: Hemoglobin 12.5, hematocrit 38.2, sodium 146, potassium 4.1, creatinine 0.98 AST 21, ALT 16  ROS: .    Today she denies chest pain, fatigue, melena, hematuria, hemoptysis, diaphoresis, weakness, presyncope, syncope, orthopnea, and PND. .  All other systems are reviewed and otherwise negative.  Studies Reviewed: [name] Kitchen    EKG:  EKG is ordered today, personally reviewed, demonstrating  EKG Interpretation Date/Time:  Monday February 26 2023 09:11:56 EST Ventricular Rate:  58 PR Interval:  176 QRS Duration:  104 QT Interval:  444 QTC Calculation: 435 R Axis:   4  Text Interpretation: Sinus bradycardia with sinus arrythmia with Premature supraventricular complexes Confirmed by Marie Peterson [phone]) on 02/26/2023 9:17:37 AM   CV Studies:  Cardiac Studies & Procedures  STRESS TESTS  EXERCISE TOLERANCE TEST (ETT) 11/12/2014  Narrative  Upsloping ST segment depression ST segment depression of 0.5 mm was noted during stress, beginning at 12 minutes of stress, and returning to baseline after less than 1 minute of recovery.  Negative, adequate GXT with the patient exercising to a HR of 153 (90%APMHR) and demonstrating excellent exercise  tolerance (15.3 met workload). There were occasional PAC's. Nondiagnostic upsloping ST depression was present in late stage 4 and 5 with rapid resolution in the immediate recovery period.   ECHOCARDIOGRAM  ECHOCARDIOGRAM COMPLETE 06/09/2020  Narrative ECHOCARDIOGRAM REPORT    Patient Name:   Marie Peterson [facility] Date of Exam: 06/09/2020 Medical Rec #:  [id number]     Height:       64.0 in Accession #:    [id number]    Weight:       199.0 lb Date of Birth:  July 13, 1965      BSA:          1.952 m Patient Age:    54 years      BP:           126/82 mmHg Patient Gender: F             HR:           57 bpm. Exam Location:  [address]  Procedure: 2D Echo, Cardiac Doppler, Color Doppler and 3D Echo  Indications:    I50.31 Acute diastolic (congestive) heart failure  History:        Patient has prior history of Echocardiogram examinations, most recent 12/28/2016. Risk Factors:Hypertension and Dyslipidemia. Atypical chest pain. Sinus bradycardia. Migraines. Lupus. Dyspnea on exertion. Lower extremity edema.  Sonographer:    Marie Peterson RCS Referring Phys: [id number] Marie Peterson  IMPRESSIONS   1. Left ventricular ejection fraction, by estimation, is 60 to 65%. The left ventricle has normal function. The left ventricle has no regional wall motion abnormalities. There is mild left ventricular hypertrophy. Left ventricular diastolic parameters were normal. 2. Right ventricular systolic function is normal. The right ventricular size is normal. Tricuspid regurgitation signal is inadequate for assessing PA pressure. 3. The mitral valve is normal in structure. Trivial mitral valve regurgitation. 4. The aortic valve is tricuspid. Aortic valve regurgitation is not visualized. No aortic stenosis is present. 5. The inferior vena cava is normal in size with greater than 50% respiratory variability, suggesting right atrial pressure of 3 mmHg.  FINDINGS Left Ventricle: Left ventricular ejection fraction,  by estimation, is 60 to 65%. The left ventricle has normal function. The left ventricle has no regional wall motion abnormalities. The left ventricular internal cavity size was normal in size. There is mild left ventricular hypertrophy. Left ventricular diastolic parameters were normal.  Right Ventricle: The right ventricular size is normal. No increase in right ventricular wall thickness. Right ventricular systolic function is normal. Tricuspid regurgitation signal is inadequate for assessing PA pressure.  Left Atrium: Left atrial size was normal in size.  Right Atrium: Right atrial size was normal in size.  Pericardium: There is no evidence of pericardial effusion.  Mitral Valve: The mitral valve is normal in structure. Trivial mitral valve regurgitation.  Tricuspid Valve: The tricuspid valve is normal in structure. Tricuspid valve regurgitation is trivial.  Aortic Valve: The aortic valve is tricuspid. Aortic valve regurgitation is not visualized. No aortic stenosis is present.  Pulmonic Valve: The pulmonic valve was not well visualized. Pulmonic valve regurgitation is trivial.  Aorta: The aortic root and ascending aorta  are structurally normal, with no evidence of dilitation.  Venous: The inferior vena cava is normal in size with greater than 50% respiratory variability, suggesting right atrial pressure of 3 mmHg.  IAS/Shunts: No atrial level shunt detected by color flow Doppler.   LEFT VENTRICLE PLAX 2D LVIDd:         4.80 cm  Diastology LVIDs:         3.10 cm  LV e' medial:    7.72 cm/s LV PW:         1.30 cm  LV E/e' medial:  10.1 LV IVS:        1.20 cm  LV e' lateral:   11.10 cm/s LVOT diam:     2.00 cm  LV E/e' lateral: 7.0 LV SV:         87 LV SV Index:   44 LVOT Area:     3.14 cm  3D Volume EF: 3D EF:        66 % LV EDV:       174 ml LV ESV:       60 ml LV SV:        115 ml  RIGHT VENTRICLE RV Basal diam:  1.80 cm RV S prime:     16.20 cm/s TAPSE (M-mode): 2.6  cm  LEFT ATRIUM             Index       RIGHT ATRIUM           Index LA diam:        4.00 cm 2.05 cm/m  RA Area:     16.60 cm LA Vol (A2C):   55.6 ml 28.48 ml/m RA Volume:   39.70 ml  20.34 ml/m LA Vol (A4C):   41.5 ml 21.26 ml/m LA Biplane Vol: 50.3 ml 25.77 ml/m AORTIC VALVE LVOT Vmax:   108.00 cm/s LVOT Vmean:  71.500 cm/s LVOT VTI:    0.276 m  AORTA Ao Root diam: 3.20 cm  MITRAL VALVE MV Area (PHT): 4.60 cm    SHUNTS MV Decel Time: 165 msec    Systemic VTI:  0.28 m MV E velocity: 77.60 cm/s  Systemic Diam: 2.00 cm MV A velocity: 64.70 cm/s MV E/A ratio:  1.20  Epifanio Lesches MD Electronically signed by Epifanio Lesches MD Signature Date/Time: 06/09/2020/12:53:50 PM    Final    MONITORS  LONG TERM MONITOR (3-14 DAYS) 02/09/2023  Narrative Patch Wear Time:  13 days and 21 hours (2024-10-27T12:05:05-398 to 2024-11-10T08:16:53-0500)  Patient had a min HR of 35 bpm, max HR of 203 bpm, and avg HR of 55 bpm. Predominant underlying rhythm was Sinus Rhythm. Slight P wave morphology changes were noted. 324 Supraventricular Tachycardia runs occurred, the run with the fastest interval lasting 12 beats with a max rate of 203 bpm, the longest lasting 11 beats with an avg rate of 111 bpm. Isolated SVEs were frequent (10.7%, [id number]), SVE Couplets were frequent (6.5%, [id number]), and SVE Triplets were occasional (2.3%, 8685). Isolated VEs were rare (<1.0%), VE Couplets were rare (<1.0%), and no VE Triplets were present.  SR/SB/ST Freq episodes of SVT (324) Freq PACs (10% burden), SVE couplets Occasional PVCs ROV to discuss with me or an APP   CT SCANS  CT CORONARY MORPH W/CTA COR W/SCORE 12/08/2020  Addendum 12/08/2020  2:14 PM ADDENDUM REPORT: 12/08/2020 14:12  CLINICAL DATA:  Chest pain  EXAM: Cardiac CTA  MEDICATIONS: Sub lingual nitro. 4mg  x 2  TECHNIQUE: The patient was  scanned on a Siemens 192 slice scanner. Gantry rotation speed was 250 msecs.  Collimation was 0.6 mm. A 100 kV prospective scan was triggered in the ascending thoracic aorta at 35-75% of the R-R interval. Average HR during the scan was 60 bpm. The 3D data set was interpreted on a dedicated work station using MPR, MIP and VRT modes. A total of 80cc of contrast was used.  FINDINGS: Non-cardiac: See separate report from [facility].  The pulmonary veins drain normally to the left atrium. No LA appendage thrombus.  Calcium Score: 37.8 Agatston units.  Coronary Arteries: Right dominant with no anomalies  LM: No plaque or stenosis.  LAD system: Calcified plaque proximal LAD with mild (<50%) stenosis.  Circumflex system: No plaque or stenosis.  RCA system: No plaque or stenosis.  IMPRESSION: 1. Coronary artery calcium score 37.8 Agatston units. This places the patient in the 88th percentile for age and gender, suggesting high risk for future cardiac events.  2.  Nonobstructive LAD disease.  Dalton Murrill   Electronically Signed By: Marca Ancona M.D. On: 12/08/2020 14:12  Narrative EXAM: OVER-READ INTERPRETATION  CT CHEST  The following report is an over-read performed by radiologist Dr. Charlett Nose of [facility] on 12/08/2020. This over-read does not include interpretation of cardiac or coronary anatomy or pathology. The coronary CTA interpretation by the cardiologist is attached.  COMPARISON:  None.  FINDINGS: Vascular: Heart is mildly enlarged.  Aorta normal caliber.  Mediastinum/Nodes: No adenopathy  Lungs/Pleura: No confluent opacity or effusion. Linear scarring at the left lung base.  Upper Abdomen: Imaging into the upper abdomen demonstrates no acute findings.  Musculoskeletal: Chest wall soft tissues are unremarkable. No acute bony abnormality.  IMPRESSION: No acute extra cardiac abnormality.  Electronically Signed: By: Charlett Nose M.D. On: 12/08/2020 08:49            Current Reported  Medications:.    Current Meds  Medication Sig   alendronate (FOSAMAX) 70 MG tablet TAKE 1 TABLET(70 MG) BY MOUTH 1 TIME A WEEK WITH A FULL GLASS OF WATER AND ON AN EMPTY STOMACH   aspirin 81 MG tablet Take 81 mg by mouth daily.   Atogepant (QULIPTA) 60 MG TABS Take 1 tablet (60 mg total) by mouth daily.   atorvastatin (LIPITOR) 40 MG tablet TAKE 1 TABLET(40 MG) BY MOUTH DAILY   BENLYSTA 200 MG/ML SOSY Inject 4 mLs into the skin once a week.   benzonatate (TESSALON) 100 MG capsule Take 100 mg by mouth 3 (three) times daily as needed for cough.   Cholecalciferol (VITAMIN D PO) Take 2,000 Int'l Units by mouth daily.    denosumab (PROLIA) 60 MG/ML SOSY injection Inject 60 mg into the skin every 6 (six) months.   diclofenac sodium (VOLTAREN) 1 % GEL Apply topically.   FERREX 150 150 MG capsule Take 1 tablet by mouth daily.   folic acid (FOLVITE) 400 MCG tablet Take 400 mcg by mouth daily.   Galcanezumab-gnlm (EMGALITY) 120 MG/ML SOAJ Inject 120 mg into the skin every 30 (thirty) days.   hydrALAZINE (APRESOLINE) 25 MG tablet TAKE 1 TABLET(25 MG) BY MOUTH TWICE DAILY   HYDROcodone-acetaminophen (NORCO) 7.5-325 MG tablet Take 1 tablet by mouth 4 (four) times daily as needed.   hydroxychloroquine (PLAQUENIL) 200 MG tablet Take 200 mg by mouth 2 (two) times daily.    L-Methylfolate-B6-B12 (METANX PO) Take 1 tablet by mouth 2 (two) times daily.    LINZESS 145 MCG CAPS capsule Take 145 mcg  by mouth every morning.   loratadine (CLARITIN) 10 MG tablet Take 10 mg by mouth daily.   LORazepam (ATIVAN) 0.5 MG tablet Take 0.5 mg by mouth daily as needed.   losartan (COZAAR) 100 MG tablet Take 100 mg by mouth daily.   magnesium oxide (MAG-OX) 400 (240 Mg) MG tablet TAKE 1 TABLET(400 MG) BY MOUTH DAILY   Multiple Vitamin (MULTIVITAMIN) tablet Take 1 tablet by mouth daily.   mupirocin ointment (BACTROBAN) 2 % Apply 1 Application topically 2 (two) times daily.   nabumetone (RELAFEN) 500 MG tablet Take 500 mg by  mouth daily.   naratriptan (AMERGE) 2.5 MG tablet Take 1 tablet (2.5 mg total) by mouth as needed for migraine. Take one (1) tablet at onset of headache; if returns or does not resolve, may repeat after 4 hours; do not exceed five (5) mg in 24 hours.   NYSTATIN powder APPLY 1 APPLICATION TOPICALLY TWICE DAILY   Omega-3 Fatty Acids (FISH OIL) 1000 MG CAPS Take 2 capsules by mouth daily.   omeprazole (PRILOSEC) 40 MG capsule Take 1 capsule (40 mg total) by mouth in the morning and at bedtime.   ondansetron (ZOFRAN-ODT) 4 MG disintegrating tablet Take 1 tablet (4 mg total) by mouth every 8 (eight) hours as needed for nausea or vomiting.   potassium chloride SA (KLOR-CON M) 20 MEQ tablet TAKE 1 TABLET(20 MEQ) BY MOUTH TWICE DAILY   predniSONE (DELTASONE) 20 MG tablet Take 20 mg by mouth daily with breakfast.   pregabalin (LYRICA) 200 MG capsule Take 200 mg by mouth daily.   PREVIDENT 5000 DRY MOUTH 1.1 % GEL dental gel Place onto teeth as directed.   solifenacin (VESICARE) 5 MG tablet Take 5 mg by mouth daily.   tiZANidine (ZANAFLEX) 4 MG tablet 1 tablet Orally Up to TID for 30 days   tolterodine (DETROL LA) 4 MG 24 hr capsule Take 4 mg by mouth daily.   torsemide (DEMADEX) 20 MG tablet TAKE 2 TABLETS(40 MG) BY MOUTH TWICE DAILY   traZODone (DESYREL) 150 MG tablet Take 150 mg by mouth at bedtime.   Ubrogepant (UBRELVY) 100 MG TABS Take 1 tablet (100 mg total) by mouth daily as needed. Take one tablet at onset of headache, may repeat 1 tablet in 2 hours, no more than 2 tablets in 24 hours   XIIDRA 5 % SOLN Place 1 drop into both eyes 2 (two) times daily.    Physical Exam:    VS:  BP 112/74   Pulse (!) 58   Ht 5\' 4"  (1.626 m)   Wt 235 lb 3.2 oz (106.7 kg)   SpO2 95%   BMI 40.37 kg/m    Wt Readings from Last 3 Encounters:  02/26/23 235 lb 3.2 oz (106.7 kg)  02/19/23 228 lb (103.4 kg)  01/03/23 230 lb 8 oz (104.6 kg)    GEN: Well nourished, well developed in no acute distress NECK: No JVD;  No carotid bruits CARDIAC: irregular RR, no murmurs, rubs, gallops RESPIRATORY:  Clear to auscultation without rales, wheezing or rhonchi  ABDOMEN: Soft, non-tender, non-distended EXTREMITIES:  No edema; No acute deformity   Asessement and Plan:.    SVT/PACs/Bradycardia/Palpitations/shortness of breath: She recently notified the office of an irregular heartbeat.  She wore a 2-week cardiac monitor that showed an average heart rate of 55 bpm, ranged from 35 to 203 bpm.  Predominant underlying rhythm was sinus rhythm, she had 324 runs of supraventricular tachycardia the fastest interval lasting 12 beats with a  max rate of 203 bpm, the longest lasting 11 beats with an average rate of 111 bpm, she had frequent PACs with a 10.7% burden, SVE couplets were frequent at 6.5% occasional PVCs. Today she reports intermittent palpitations some described as skipped beats and some described as fast heartbeats lasting for less than a minute with associated dizziness, lightheadedness and shortness of breath. She denies presyncope or syncope. Given her history of bradycardia will defer starting beta-blocker at this time. Patient has requested referral to EP given results and symptoms. Reviewed ED precautions. Will check echocardiogram, CMET, TSH and magnesium level today.   Hypertension: Blood pressure well controlled today at 112/74. Continue current antihypertensive regimen.   Non-obstructive CAD: Coronary CTA in 11/2020 which showed coronary calcium score 37.8 agitation units, 88 percentile for age and gender, with nonobstructive disease of the LAD. She denies anginal symptoms. No indication for ischemic evaluation.  Continue aspirin 81 mg daily   Chronic CHF with preserved EF/lower extremity edema: Echo in in 05/2020 showed LVEF of 60 to 65% with normal wall motion and mild LVH.  Diastolic parameters were normal.  Today she has mild bilateral lower extremity edema, on torsemide.  Encouraged decreased salt intake, lower  extremity elevation and compression stockings.   Hyperlipidemia: Last lipid profile in 2022 indicates LDL of 59, will check CMET today and she will return for fasting lipid profile.    Disposition: Establish with EP at next available appointment. Follow up with Dr. Allyson Peterson or Marie Littler, NP in 07/2023.   Signed, Rip Harbour, NP

## 2023-02-26 ENCOUNTER — Ambulatory Visit: Payer: 59 | Attending: Cardiology | Admitting: Cardiology

## 2023-02-26 ENCOUNTER — Encounter: Payer: Self-pay | Admitting: Cardiology

## 2023-02-26 VITALS — BP 112/74 | HR 58 | Ht 64.0 in | Wt 235.2 lb

## 2023-02-26 DIAGNOSIS — R001 Bradycardia, unspecified: Secondary | ICD-10-CM | POA: Diagnosis not present

## 2023-02-26 DIAGNOSIS — I491 Atrial premature depolarization: Secondary | ICD-10-CM | POA: Diagnosis not present

## 2023-02-26 DIAGNOSIS — R0602 Shortness of breath: Secondary | ICD-10-CM

## 2023-02-26 DIAGNOSIS — R600 Localized edema: Secondary | ICD-10-CM

## 2023-02-26 DIAGNOSIS — R002 Palpitations: Secondary | ICD-10-CM

## 2023-02-26 DIAGNOSIS — I499 Cardiac arrhythmia, unspecified: Secondary | ICD-10-CM

## 2023-02-26 DIAGNOSIS — I10 Essential (primary) hypertension: Secondary | ICD-10-CM | POA: Diagnosis not present

## 2023-02-26 DIAGNOSIS — E785 Hyperlipidemia, unspecified: Secondary | ICD-10-CM

## 2023-02-26 DIAGNOSIS — I2510 Atherosclerotic heart disease of native coronary artery without angina pectoris: Secondary | ICD-10-CM

## 2023-02-26 DIAGNOSIS — I5032 Chronic diastolic (congestive) heart failure: Secondary | ICD-10-CM

## 2023-02-26 NOTE — Patient Instructions (Addendum)
Medication Instructions:  No changes *If you need a refill on your cardiac medications before your next appointment, please call your pharmacy*  Lab Work: Today we will need Cmet, TSH, and Mag to be drawn At some point this month please have a lipid panel drawn. If you have labs (blood work) drawn today and your tests are completely normal, you will receive your results only by: MyChart Message (if you have MyChart) OR A paper copy in the mail If you have any lab test that is abnormal or we need to change your treatment, we will call you to review the results.  Testing/Procedures: Your physician has requested that you have an echocardiogram. Echocardiography is a painless test that uses sound waves to create images of your heart. It provides your doctor with information about the size and shape of your heart and how well your heart's chambers and valves are working. This procedure takes approximately one hour. There are no restrictions for this procedure. Please do NOT wear cologne, perfume, aftershave, or lotions (deodorant is allowed). Please arrive 15 minutes prior to your appointment time.  Please note: We ask at that you not bring children with you during ultrasound (echo/ vascular) testing. Due to room size and safety concerns, children are not allowed in the ultrasound rooms during exams. Our front office staff cannot provide observation of children in our lobby area while testing is being conducted. An adult accompanying a patient to their appointment will only be allowed in the ultrasound room at the discretion of the ultrasound technician under special circumstances. We apologize for any inconvenience.  Follow-Up: At [facility], you and your health needs are our priority.  As part of our continuing mission to provide you with exceptional heart care, we have created designated Provider Care Teams.  These Care Teams include your primary Cardiologist (physician) and Advanced  Practice Providers (APPs -  Physician Assistants and Nurse Practitioners) who all work together to provide you with the care you need, when you need it.  We recommend signing up for the patient portal called "MyChart".  Sign up information is provided on this After Visit Summary.  MyChart is used to connect with patients for Virtual Visits (Telemedicine).  Patients are able to view lab/test results, encounter notes, upcoming appointments, etc.  Non-urgent messages can be sent to your provider as well.   To learn more about what you can do with MyChart, go to [email or web address].    Your next appointment:   5 month(s)  Provider:   Nanetta Batty, MD   Other Instructions We have referred you out to our Electrophysiologist over at [address].

## 2023-02-27 ENCOUNTER — Telehealth: Payer: Self-pay

## 2023-02-27 ENCOUNTER — Ambulatory Visit: Payer: Medicare Other

## 2023-02-27 LAB — CYTOLOGY - PAP
Comment: NEGATIVE
Diagnosis: NEGATIVE
High risk HPV: NEGATIVE

## 2023-02-27 LAB — COMPREHENSIVE METABOLIC PANEL
ALT: 21 [IU]/L (ref 0–32)
AST: 36 [IU]/L (ref 0–40)
Albumin: 3.9 g/dL (ref 3.8–4.9)
Alkaline Phosphatase: 103 [IU]/L (ref 44–121)
BUN/Creatinine Ratio: 24 — ABNORMAL HIGH (ref 9–23)
BUN: 20 mg/dL (ref 6–24)
Bilirubin Total: 0.2 mg/dL (ref 0.0–1.2)
CO2: 24 mmol/L (ref 20–29)
Calcium: 9.7 mg/dL (ref 8.7–10.2)
Chloride: 104 mmol/L (ref 96–106)
Creatinine, Ser: 0.84 mg/dL (ref 0.57–1.00)
Globulin, Total: 2.2 g/dL (ref 1.5–4.5)
Glucose: 92 mg/dL (ref 70–99)
Potassium: 3.9 mmol/L (ref 3.5–5.2)
Sodium: 147 mmol/L — ABNORMAL HIGH (ref 134–144)
Total Protein: 6.1 g/dL (ref 6.0–8.5)
eGFR: 80 mL/min/{1.73_m2} (ref >59)

## 2023-02-27 LAB — MAGNESIUM: Magnesium: 1.9 mg/dL (ref 1.6–2.3)

## 2023-02-27 LAB — TSH: TSH: 0.939 u[IU]/mL (ref 0.450–4.500)

## 2023-02-27 NOTE — Telephone Encounter (Signed)
Called patient advised of below they verbalized understanding.

## 2023-02-27 NOTE — Telephone Encounter (Signed)
-----   Message from Rip Harbour sent at 02/27/2023 10:07 AM EST ----- Please let Marie Peterson know that her kidney function and liver function is normal. Her BUN is very slightly increased, would recommend staying hydrated. Her potassium and magnesium levels are normal. Her TSH is normal. Overall good results.

## 2023-03-19 IMAGING — CR DG CHEST 2V
2 series · 2 of 2 positions shown · non-contrast
Comparison: 04/23/2013

CLINICAL DATA: Shortness of breath, dyspnea on exertion for 3
months, hypertension

EXAM:
CHEST - 2 VIEW

[w chest pa]
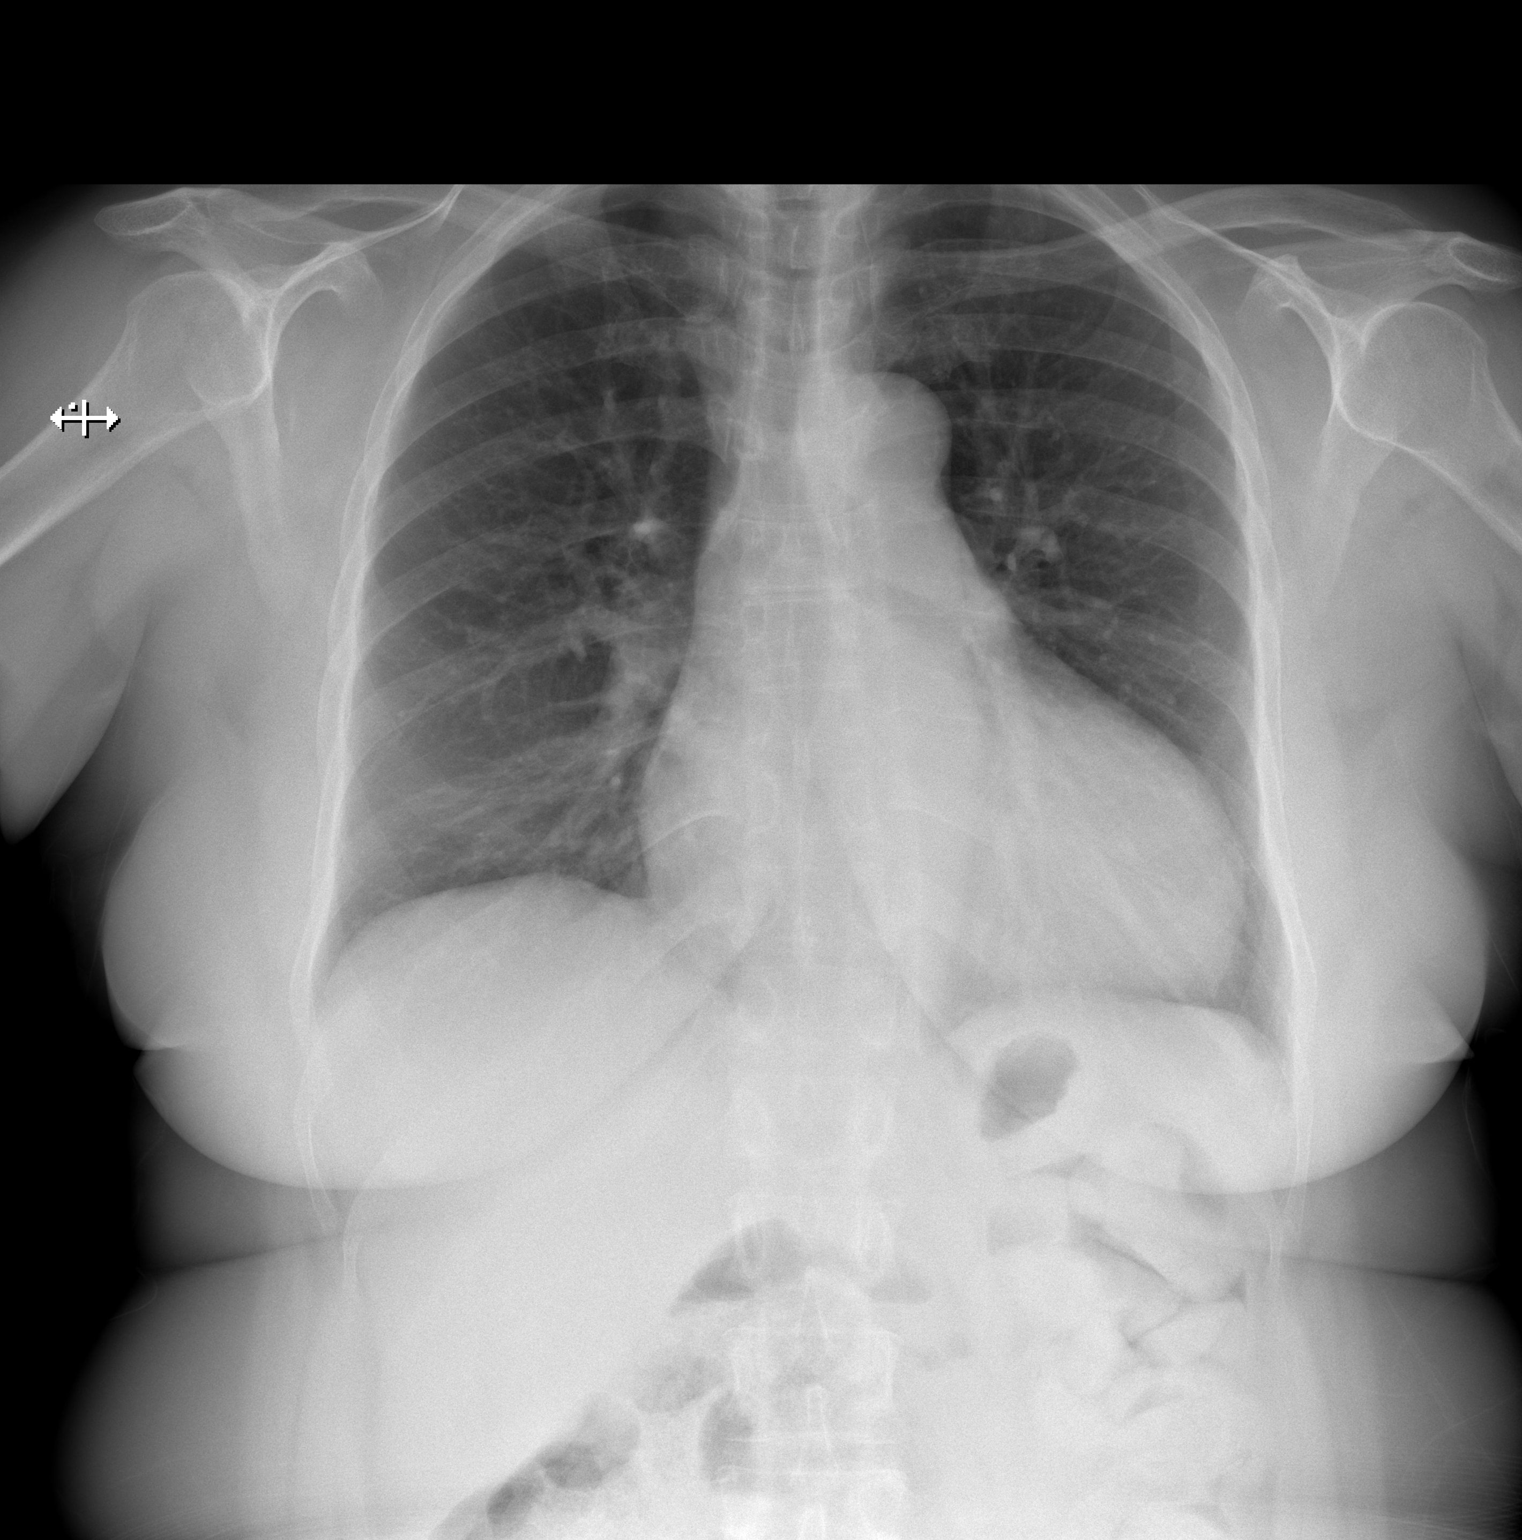

[w chest lat]
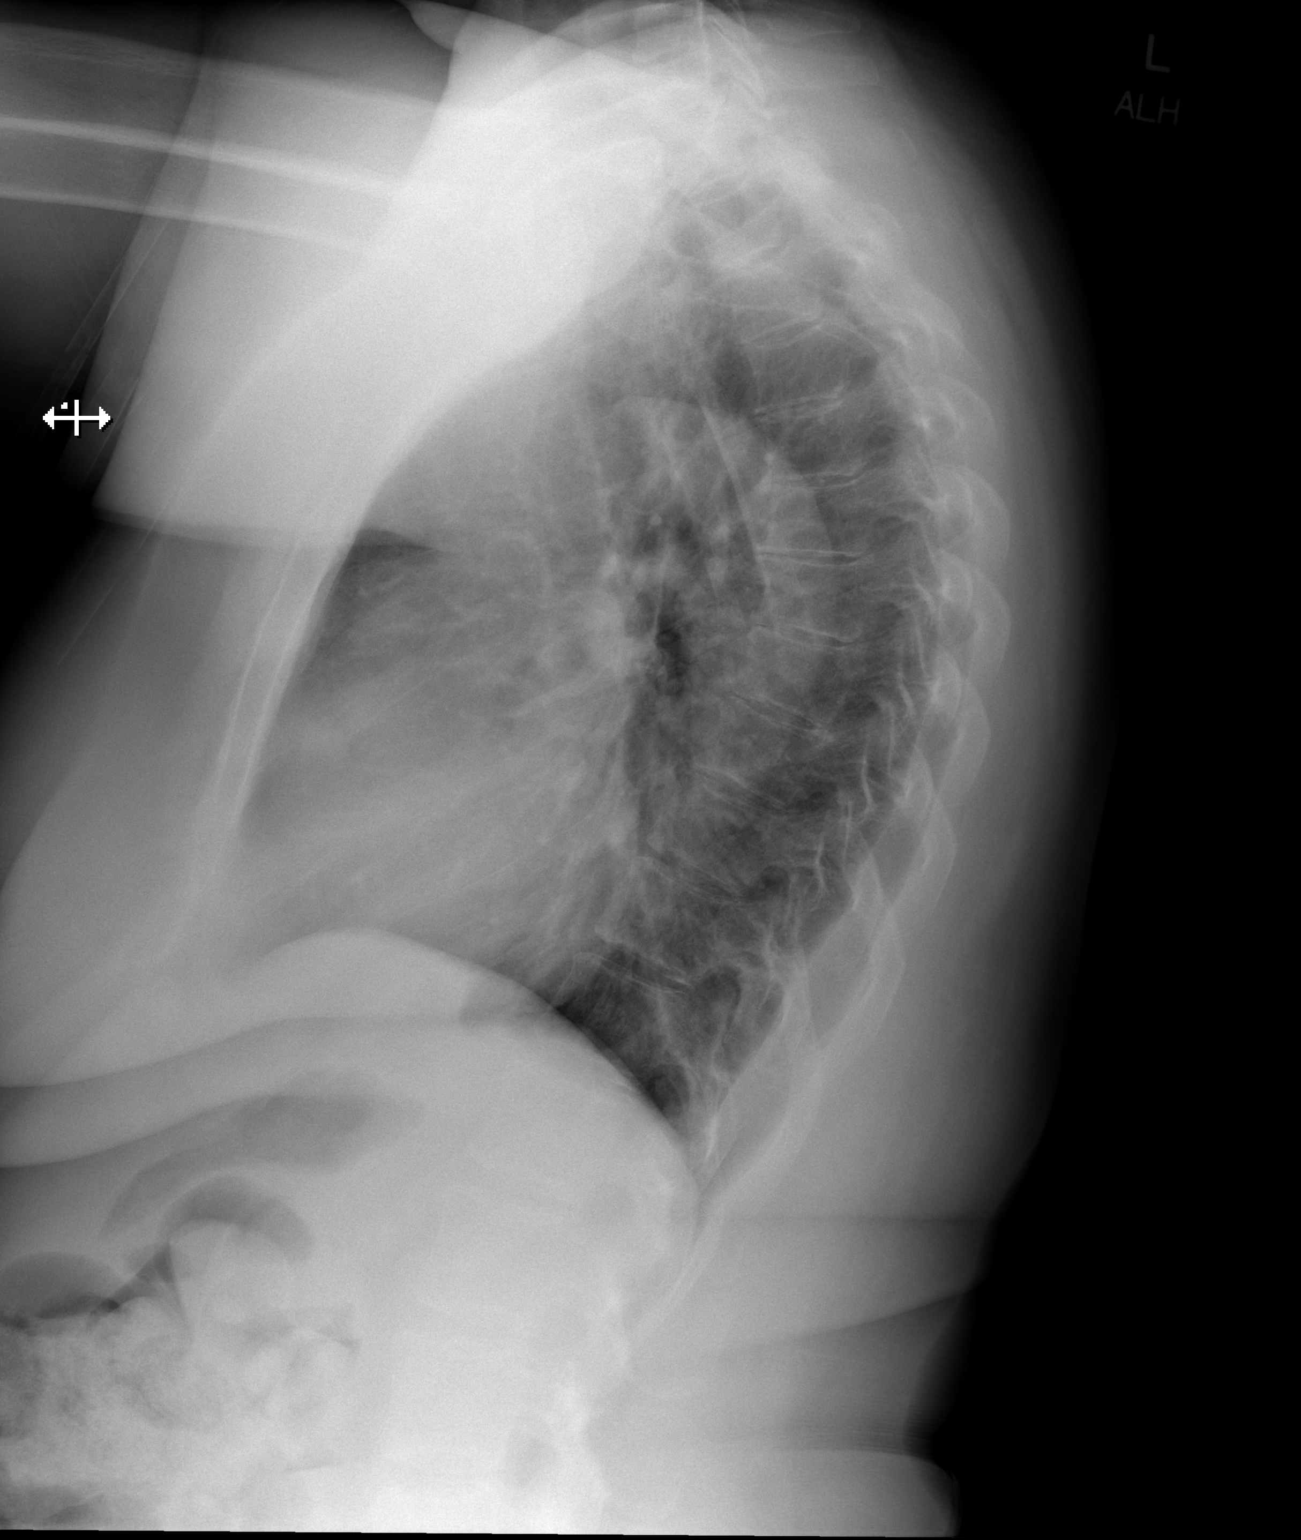

[2 of 2 positions shown; findings below may reference images not displayed]

FINDINGS: Enlargement of cardiac silhouette.

Mediastinal contours and pulmonary vascularity normal.

Lungs clear.

No pulmonary infiltrate, pleural effusion, or pneumothorax.

Osseous structures unremarkable.
IMPRESSION: Enlargement of cardiac silhouette.

No acute abnormalities.

## 2023-03-25 NOTE — Progress Notes (Unsigned)
  Electrophysiology Office Note:   Date:  03/26/2023  ID:  BREAHNA LAMOREAUX, DOB June 18, 1965, MRN [id number]  Primary Cardiologist: Nanetta Batty, MD Primary Heart Failure: None Electrophysiologist: Nobie Putnam, MD      History of Present Illness:   Marie Peterson is a 57 y.o. female with h/o atypical chest pain with nonobstructive CAD noted on coronary CTA on 12/08/2020, chronic diastolic heart failure, sinus bradycardia, hypertension, hyperlipidemia, GERD, SLE with lupus nephritis, fibromyalgia, migraines, arthritis who is being seen today for evaluation of palpitations.  Patient had gone to see a neurologist in October 2024 and was noted to have an irregular heartbeat on auscultation.  She was then ordered a ZIO monitor which showed frequent PACs and nonsustained SVT.  She reports intermittent palpitations that last for seconds.  She says these are not too distressing for her. She has occasional episodes of dizziness. She denies presyncope or syncope.  Otherwise no new or acute complaints.  Review of systems complete and found to be negative unless listed in HPI.   EP Information / Studies Reviewed:    EKG is not ordered today. EKG from 02/26/23 reviewed which showed sinus bradycardia with PACs.      Zio 12/2022  Echo 06/09/20: Normal LV size and function.  Mild LVH.  LVEF 60 to 65%. Normal RV size and function. No significant valvular disease.  Coronary CTA 12/08/20: Nonobstructive CAD.  Physical Exam:   VS:  BP (!) 146/86   Pulse 61   Ht 5\' 4"  (1.626 m)   Wt 238 lb (108 kg)   SpO2 96%   BMI 40.85 kg/m    Wt Readings from Last 3 Encounters:  03/26/23 238 lb (108 kg)  02/26/23 235 lb 3.2 oz (106.7 kg)  02/19/23 228 lb (103.4 kg)     GEN: Well nourished, well developed in no acute distress NECK: No JVD CARDIAC: Normal rate and regular rhythm RESPIRATORY:  Clear to auscultation without rales, wheezing or rhonchi  ABDOMEN: Soft, non-tender, non-distended EXTREMITIES:  No  edema; No deformity   ASSESSMENT AND PLAN:   Marie Peterson is a 57 y.o. female with h/o atypical chest pain with nonobstructive CAD noted on coronary CTA on 12/08/2020, chronic diastolic heart failure, sinus bradycardia, hypertension, hyperlipidemia, GERD, SLE with lupus nephritis, fibromyalgia, migraines, arthritis who is being seen today for evaluation of palpitations.  Patient has frequent PACs and short bursts of nonsustained SVT, likely atrial tachycardia, on a ZIO monitor.  She does not appear to be overtly symptomatic with this.  She only pressed her symptom trigger button 3 times during 2 weeks.  She has a low resting heart rates in the high 50s low 60s, which do not appear to be causing her symptoms either.  #. Palpitations #. Frequent PACs #. Non-sustained SVT: Likely AT -No AAD options due to Plaquenil. -Burden not high enough or events lasting long enough to justify ablation. -Will try low-dose metoprolol XL 12.5 mg once daily.  She will check her heart rates at home.  If heart rates are in the 40s and then she will stop metoprolol.  #. Chronic diastolic heart failure:  -Well compensated on exam today. -Continue follow-up with general cardiology..  #. Hypertension -Above goal today.  Recommend checking blood pressures 1-2 times per week at home and recording the values.  Recommend bringing these recordings to the primary care physician.   Follow up with Dr. Jimmey Ralph as needed.  Signed, Nobie Putnam, MD

## 2023-03-26 ENCOUNTER — Ambulatory Visit: Payer: 59 | Attending: Cardiology | Admitting: Cardiology

## 2023-03-26 ENCOUNTER — Encounter: Payer: Self-pay | Admitting: Cardiology

## 2023-03-26 VITALS — BP 146/86 | HR 61 | Ht 64.0 in | Wt 238.0 lb

## 2023-03-26 DIAGNOSIS — I491 Atrial premature depolarization: Secondary | ICD-10-CM | POA: Diagnosis not present

## 2023-03-26 DIAGNOSIS — I10 Essential (primary) hypertension: Secondary | ICD-10-CM

## 2023-03-26 DIAGNOSIS — I471 Supraventricular tachycardia, unspecified: Secondary | ICD-10-CM

## 2023-03-26 DIAGNOSIS — I5032 Chronic diastolic (congestive) heart failure: Secondary | ICD-10-CM

## 2023-03-26 DIAGNOSIS — R002 Palpitations: Secondary | ICD-10-CM | POA: Diagnosis not present

## 2023-03-26 LAB — LIPID PANEL
Chol/HDL Ratio: 2.3 {ratio} (ref 0.0–4.4)
Cholesterol, Total: 149 mg/dL (ref 100–199)
HDL: 66 mg/dL (ref >39)
LDL Chol Calc (NIH): 71 mg/dL (ref 0–99)
Triglycerides: 58 mg/dL (ref 0–149)
VLDL Cholesterol Cal: 12 mg/dL (ref 5–40)

## 2023-03-26 MED ORDER — METOPROLOL SUCCINATE ER 25 MG PO TB24: 12.5000 mg | ORAL_TABLET | Freq: Every day | ORAL | 3 refills | Status: DC

## 2023-03-26 NOTE — Patient Instructions (Signed)
Medication Instructions:  Your physician has recommended you make the following change in your medication:  1) START taking Toprol XL (metoprolol succinate) 12.5 mg (1/2 tablet) once daily   *If you need a refill on your cardiac medications before your next appointment, please call your pharmacy*  Follow-Up: At [facility], you and your health needs are our priority.  As part of our continuing mission to provide you with exceptional heart care, we have created designated Provider Care Teams.  These Care Teams include your primary Cardiologist (physician) and Advanced Practice Providers (APPs -  Physician Assistants and Nurse Practitioners) who all work together to provide you with the care you need, when you need it.  Your next appointment:   Follow up with Dr. Jimmey Ralph as needed

## 2023-03-27 ENCOUNTER — Telehealth: Payer: Self-pay

## 2023-03-27 NOTE — Telephone Encounter (Signed)
 Patient called back advised of below they verbalized understanding

## 2023-03-27 NOTE — Telephone Encounter (Signed)
-----   Message from Rip Harbour sent at 03/27/2023  8:00 AM EST ----- Please let Marie Peterson know that her LDL or bad cholesterol is 71, this is close to her goal of less than 70. Continue current medications and follow up as planned.

## 2023-03-27 NOTE — Telephone Encounter (Signed)
left message to call back.

## 2023-04-02 ENCOUNTER — Ambulatory Visit (HOSPITAL_COMMUNITY): Payer: Medicare Other

## 2023-04-03 ENCOUNTER — Ambulatory Visit (HOSPITAL_COMMUNITY): Payer: Medicare Other

## 2023-04-03 ENCOUNTER — Encounter: Payer: Self-pay | Admitting: Cardiology

## 2023-04-10 ENCOUNTER — Ambulatory Visit: Payer: 59 | Attending: Cardiology | Admitting: Pharmacist Clinician (PhC)/ Clinical Pharmacy Specialist

## 2023-04-10 VITALS — BP 116/76 | HR 48

## 2023-04-10 DIAGNOSIS — I10 Essential (primary) hypertension: Secondary | ICD-10-CM

## 2023-04-10 NOTE — Patient Instructions (Signed)
 Follow up appointment: WITH DR BERRY IN MAY (SCHEDULE TODAY)  Take your BP meds as follows:  STOP METOPROLOL   NO CHANGES TO LOSARTAN  OR HYDRALAZINE    Check your blood pressure at home daily (if able) and keep record of the readings.  Your blood pressure goal is < 130/80  To check your pressure at home you will need to:  1. Sit up in a chair, with feet flat on the floor and back supported. Do not cross your ankles or legs. 2. Rest your left arm so that the cuff is about heart level. If the cuff goes on your upper arm,  then just relax the arm on the table, arm of the chair or your lap. If you have a wrist cuff, we  suggest relaxing your wrist against your chest (think of it as Pledging the Flag with the  wrong arm).  3. Place the cuff snugly around your arm, about 1 inch above the crook of your elbow. The  cords should be inside the groove of your elbow.  4. Sit quietly, with the cuff in place, for about 5 minutes. After that 5 minutes press the power  button to start a reading. 5. Do not talk or move while the reading is taking place.  6. Record your readings on a sheet of paper. Although most cuffs have a memory, it is often  easier to see a pattern developing when the numbers are all in front of you.  7. You can repeat the reading after 1-3 minutes if it is recommended  Make sure your bladder is empty and you have not had caffeine or tobacco within the last 30 min  Always bring your blood pressure log with you to your appointments. If you have not brought your monitor in to be double checked for accuracy, please bring it to your next appointment.  You can find a list of quality blood pressure cuffs at [email or web address]  Important lifestyle changes to control high blood pressure  Intervention  Effect on the BP  Lose extra pounds and watch your waistline Weight loss is one of the most effective lifestyle changes for controlling blood pressure. If you're overweight or obese, losing  even a small amount of weight can help reduce blood pressure. Blood pressure might go down by about 1 millimeter of mercury (mm Hg) with each kilogram (about 2.2 pounds) of weight lost.  Exercise regularly As a general goal, aim for at least 30 minutes of moderate physical activity every day. Regular physical activity can lower high blood pressure by about 5 to 8 mm Hg.  Eat a healthy diet Eating a diet rich in whole grains, fruits, vegetables, and low-fat dairy products and low in saturated fat and cholesterol. A healthy diet can lower high blood pressure by up to 11 mm Hg.  Reduce salt (sodium) in your diet Even a small reduction of sodium in the diet can improve heart health and reduce high blood pressure by about 5 to 6 mm Hg.  Limit alcohol  One drink equals 12 ounces of beer, 5 ounces of wine, or 1.5 ounces of 80-proof liquor.  Limiting alcohol  to less than one drink a day for women or two drinks a day for men can help lower blood pressure by about 4 mm Hg.   If you have any questions or concerns please use My Chart to send questions or call the office at [phone]

## 2023-04-10 NOTE — Progress Notes (Signed)
 Office Visit    Patient Name: Marie Peterson Date of Encounter: 04/24/2023  Primary Care Provider:  Corlis Pagan, NP Primary Cardiologist:  Dorn Lesches, MD  Chief Complaint    Hypertension  Significant Past Medical History   CAD Non-obstructive on CTA  bradycardia HR runs 40-60 range  CHF HFpEF  palpitations Zio showed frequent PAC's with short bursts of SVT, not overly bothersome to patient; on metoprolol  succ 12.5  HLD 12/24 LDL 71on atorvastatin  40  SLE W/lupus nephritis    Allergies  Allergen Reactions   Ubrogepant  Swelling    Lip swelling    Benazepril      Lip swelling   Cymbalta [Duloxetine Hcl]     Lip swelling   Hygroton  [Chlorthalidone ] Swelling    Swelling in lips   Mirabegron Itching    Other reaction(s): rash   Other     Other reaction(s): Unknown   Duloxetine Rash    Lip swelling    History of Present Illness    Marie Peterson is a 58 y.o. female patient of Dr Lesches, in the office today for hypertension evaluation.   She has been on losartan , metoprolol  and hydralazine .  Because of her history of SVT/PAC's and palpitations, she was given metoprolol  succ 12.5 mg daily, but told it would need to be discontinued if her heart rate were to consistently stay in the 40's.    Today she is in the office for follow up.  Her heart rate for the past 2 weeks is averaging at 51, with the highest being 55.  She felt there was some improvement in her palpitations, but was uncomfortable with her heart rate < 50.  She notes some dizziness with positional changes.    Blood Pressure Goal:  130/80  Current Medications:  losartan  100 mg every day, hydralazine  25 mg bid  Previously tried:  benazepril  - lip swelling, chlorthalidone  - lip swelling   Family Hx:  mother had hypertension, COPD, asthma, still living, father hypertension, died non heart related; siblings healthy overall, 2 daughters healthy   Social Hx:      Tobacco: no  Alcohol : no  Caffeine: 1 coffee in  the mornings home brewed  Diet:   mostly home cooke, rarely eat out; occasional salt with cooking but not at the table, vegetables mix of ffc (rarely canned); protein mostly chicken and fish  Exercise:  no, walks when able (joint problems, swelling in legs, plantar fascitis)  Home BP readings:  newer device about 6 months old ([employer])  Last 12 readings average 151/87  HR 51; only 1 reading < 130 systolic, 2 readings < 80 diastolic  Adherence Assessment  Do you ever forget to take your medication? [] Yes [x] No  Do you ever skip doses due to side effects? [] Yes [x] No  Do you have trouble affording your medicines? [] Yes [x] No  Are you ever unable to pick up your medication due to transportation difficulties? [] Yes [x] No   Adherence strategy: 7 day pill minders  Accessory Clinical Findings    Lab Results  Component Value Date   CREATININE 0.84 02/26/2023   BUN 20 02/26/2023   NA 147 (H) 02/26/2023   K 3.9 02/26/2023   CL 104 02/26/2023   CO2 24 02/26/2023   Lab Results  Component Value Date   ALT 21 02/26/2023   AST 36 02/26/2023   ALKPHOS 103 02/26/2023   BILITOT 0.2 02/26/2023   Lab Results  Component Value Date   HGBA1C 5.4 09/10/2018  Home Medications    Current Outpatient Medications  Medication Sig Dispense Refill   aspirin 81 MG tablet Take 81 mg by mouth daily.     atorvastatin  (LIPITOR) 40 MG tablet TAKE 1 TABLET(40 MG) BY MOUTH DAILY 90 tablet 3   BENLYSTA 200 MG/ML SOSY Inject 4 mLs into the skin once a week.     alendronate  (FOSAMAX ) 70 MG tablet TAKE 1 TABLET(70 MG) BY MOUTH 1 TIME A WEEK WITH A FULL GLASS OF WATER AND ON AN EMPTY STOMACH (Patient not taking: Reported on 03/26/2023) 4 tablet 0   Atogepant  (QULIPTA ) 60 MG TABS Take 1 tablet (60 mg total) by mouth daily. 90 tablet 3   benzonatate (TESSALON) 100 MG capsule Take 100 mg by mouth 3 (three) times daily as needed for cough.     Buprenorphine HCl (BELBUCA) 450 MCG FILM      Cholecalciferol  (VITAMIN D  PO) Take 2,000 Int'l Units by mouth daily.      denosumab  (PROLIA ) 60 MG/ML SOSY injection Inject 60 mg into the skin every 6 (six) months.     diclofenac sodium (VOLTAREN) 1 % GEL Apply topically.     FERREX 150 150 MG capsule Take 1 tablet by mouth daily.  1   folic acid  (FOLVITE ) 400 MCG tablet Take 400 mcg by mouth daily.     Galcanezumab -gnlm (EMGALITY ) 120 MG/ML SOAJ Inject 120 mg into the skin every 30 (thirty) days. 3 mL 3   Galcanezumab -gnlm (EMGALITY ) 120 MG/ML SOAJ Inject 120 mg into the skin every 30 (thirty) days. 2 mL 0   hydrALAZINE  (APRESOLINE ) 25 MG tablet TAKE 1 TABLET(25 MG) BY MOUTH TWICE DAILY 90 tablet 3   HYDROcodone -acetaminophen  (NORCO) 7.5-325 MG tablet Take 1 tablet by mouth 4 (four) times daily as needed.     hydroxychloroquine (PLAQUENIL) 200 MG tablet Take 200 mg by mouth 2 (two) times daily.   0   L-Methylfolate-B6-B12 (METANX PO) Take 1 tablet by mouth 2 (two) times daily.      LINZESS 145 MCG CAPS capsule Take 145 mcg by mouth every morning.     loratadine (CLARITIN) 10 MG tablet Take 10 mg by mouth daily.     LORazepam (ATIVAN) 0.5 MG tablet Take 0.5 mg by mouth daily as needed.     losartan  (COZAAR ) 100 MG tablet Take 100 mg by mouth daily.     magnesium  oxide (MAG-OX) 400 (240 Mg) MG tablet TAKE 1 TABLET(400 MG) BY MOUTH DAILY 90 tablet 3   metoprolol  succinate (TOPROL  XL) 25 MG 24 hr tablet Take 0.5 tablets (12.5 mg total) by mouth daily. 45 tablet 3   Multiple Vitamin (MULTIVITAMIN) tablet Take 1 tablet by mouth daily.     mupirocin  ointment (BACTROBAN ) 2 % Apply 1 Application topically 2 (two) times daily. 30 g 2   nabumetone (RELAFEN) 500 MG tablet Take 500 mg by mouth daily.     naratriptan  (AMERGE) 2.5 MG tablet Take 1 tablet (2.5 mg total) by mouth as needed for migraine. Take one (1) tablet at onset of headache; if returns or does not resolve, may repeat after 4 hours; do not exceed five (5) mg in 24 hours. 10 tablet 11   NYSTATIN  powder  APPLY 1 APPLICATION TOPICALLY TWICE DAILY 15 g 1   Omega-3 Fatty Acids (FISH OIL) 1000 MG CAPS Take 2 capsules by mouth daily.     omeprazole  (PRILOSEC) 40 MG capsule Take 1 capsule (40 mg total) by mouth in the morning and at bedtime.  60 capsule 5   ondansetron  (ZOFRAN -ODT) 4 MG disintegrating tablet Take 1 tablet (4 mg total) by mouth every 8 (eight) hours as needed for nausea or vomiting. 20 tablet 11   potassium chloride  SA (KLOR-CON  M) 20 MEQ tablet TAKE 1 TABLET(20 MEQ) BY MOUTH TWICE DAILY 180 tablet 3   predniSONE (DELTASONE) 20 MG tablet Take 20 mg by mouth daily with breakfast.     pregabalin  (LYRICA ) 200 MG capsule Take 200 mg by mouth daily.     PREVIDENT 5000 DRY MOUTH 1.1 % GEL dental gel Place onto teeth as directed.     sertraline (ZOLOFT) 100 MG tablet Take 1 tablet by mouth daily. (Patient not taking: Reported on 03/26/2023)     sertraline (ZOLOFT) 25 MG tablet Take by mouth with 100 mg tablet for total of 125 mg at bedtime. (Patient not taking: Reported on 02/26/2023)     solifenacin (VESICARE) 5 MG tablet Take 5 mg by mouth daily.     Suvorexant (BELSOMRA) 20 MG TABS Take 20 mg by mouth Nightly. Take one tablet at bedtime daily     tiZANidine (ZANAFLEX) 4 MG tablet 1 tablet Orally Up to TID for 30 days     tolterodine (DETROL LA) 4 MG 24 hr capsule Take 4 mg by mouth daily.     torsemide  (DEMADEX ) 20 MG tablet TAKE 2 TABLETS(40 MG) BY MOUTH TWICE DAILY 360 tablet 3   traZODone (DESYREL) 150 MG tablet Take 150 mg by mouth at bedtime.     Ubrogepant  (UBRELVY ) 100 MG TABS Take 1 tablet (100 mg total) by mouth daily as needed. Take one tablet at onset of headache, may repeat 1 tablet in 2 hours, no more than 2 tablets in 24 hours 10 tablet 11   XIIDRA 5 % SOLN Place 1 drop into both eyes 2 (two) times daily.     No current facility-administered medications for this visit.       Assessment & Plan    Essential hypertension Assessment: BP is controlled in office BP 112/56  mmHg;  above the goal (<130/80). Tolerates losartan  and hydralazine  well without any side effects Denies chest pain, headaches,or swelling HR average 51, with lowest reading at 45 Reiterated the importance of regular exercise and low salt diet   Plan:  Stop taking metoprolol  succ 12.5 mg daily Continue taking losartan  100 mg every day, hydralazine  25 mg bid Patient to keep record of BP readings with heart rate and report to us  at the next visit Patient to follow up with Dr. Court in May  Labs ordered today:  none   Allean Mink PharmD CPP [facility]  [address] [place], [place] [address] [phone]

## 2023-04-16 ENCOUNTER — Encounter: Payer: Self-pay | Admitting: Pharmacist Clinician (PhC)/ Clinical Pharmacy Specialist

## 2023-04-16 NOTE — Assessment & Plan Note (Signed)
Assessment: BP is controlled in office BP 112/56 mmHg;  above the goal (<130/80). Tolerates losartan and hydralazine well without any side effects Denies chest pain, headaches,or swelling HR average 51, with lowest reading at 45 Reiterated the importance of regular exercise and low salt diet   Plan:  Stop taking metoprolol succ 12.5 mg daily Continue taking losartan 100 mg every day, hydralazine 25 mg bid Patient to keep record of BP readings with heart rate and report to [place] at the next visit Patient to follow up with Dr. Allyson Sabal in May  Labs ordered today:  none

## 2023-04-25 ENCOUNTER — Ambulatory Visit (HOSPITAL_COMMUNITY): Payer: 59 | Attending: Internal Medicine

## 2023-04-25 ENCOUNTER — Telehealth: Payer: Self-pay

## 2023-04-25 DIAGNOSIS — I10 Essential (primary) hypertension: Secondary | ICD-10-CM | POA: Diagnosis present

## 2023-04-25 DIAGNOSIS — R002 Palpitations: Secondary | ICD-10-CM

## 2023-04-25 DIAGNOSIS — R0602 Shortness of breath: Secondary | ICD-10-CM | POA: Diagnosis present

## 2023-04-25 DIAGNOSIS — R001 Bradycardia, unspecified: Secondary | ICD-10-CM | POA: Diagnosis present

## 2023-04-25 DIAGNOSIS — R600 Localized edema: Secondary | ICD-10-CM

## 2023-04-25 DIAGNOSIS — E785 Hyperlipidemia, unspecified: Secondary | ICD-10-CM

## 2023-04-25 DIAGNOSIS — I491 Atrial premature depolarization: Secondary | ICD-10-CM | POA: Diagnosis present

## 2023-04-25 DIAGNOSIS — I2510 Atherosclerotic heart disease of native coronary artery without angina pectoris: Secondary | ICD-10-CM | POA: Diagnosis present

## 2023-04-25 DIAGNOSIS — I499 Cardiac arrhythmia, unspecified: Secondary | ICD-10-CM

## 2023-04-25 DIAGNOSIS — I5032 Chronic diastolic (congestive) heart failure: Secondary | ICD-10-CM | POA: Diagnosis present

## 2023-04-25 LAB — ECHOCARDIOGRAM COMPLETE
Area-P 1/2: 4.8 cm2
S' Lateral: 2.8 cm

## 2023-04-25 NOTE — Telephone Encounter (Signed)
Called patient advised of below they verbalized understanding.

## 2023-04-25 NOTE — Telephone Encounter (Signed)
-----   Message from Rip Harbour sent at 04/25/2023  9:57 AM EST ----- Please let Marie Peterson know that her echo showed normal heart squeeze and function, there was some mild thickening of the left lower chamber of the heart, this is common with history of hypertension. There is trivial leaking of the mitral valve. Overall very similar to echo in 05/2020, good result!

## 2023-05-22 ENCOUNTER — Encounter: Payer: Self-pay | Admitting: Pharmacist Clinician (PhC)/ Clinical Pharmacy Specialist

## 2023-05-25 MED ORDER — HYDRALAZINE HCL 25 MG PO TABS: 25.0000 mg | ORAL_TABLET | Freq: Three times a day (TID) | ORAL | 3 refills | Status: DC

## 2023-05-28 ENCOUNTER — Telehealth: Payer: Self-pay | Admitting: *Deleted

## 2023-05-28 DIAGNOSIS — M810 Age-related osteoporosis without current pathological fracture: Secondary | ICD-10-CM

## 2023-05-28 NOTE — Telephone Encounter (Signed)
Insurance information submitted to [employer] portal. Will await summary of benefits for prolia.    

## 2023-06-01 ENCOUNTER — Ambulatory Visit: Payer: Medicare Other | Admitting: Cardiovascular Disease

## 2023-06-12 MED ORDER — DENOSUMAB 60 MG/ML ~~LOC~~ SOSY
60.0000 mg | PREFILLED_SYRINGE | Freq: Once | SUBCUTANEOUS | Status: AC
Start: 2023-06-14 — End: 2023-06-14
  Administered 2023-06-14: 60 mg via SUBCUTANEOUS

## 2023-06-12 NOTE — Telephone Encounter (Signed)
 Information submitted to [facility] portal and per portal, no prior authorization is required for prolia. Reference #: [id number].

## 2023-06-12 NOTE — Telephone Encounter (Signed)
 Deductible:  $257 of $257 met   OOP MAX: none  Annual exam: 02/19/23 TW  Calcium:  9.7          Date: 02/26/23  Upcoming dental procedures: No   Hx of Kidney Disease: No   Last Bone Density Scan: 11/08/21  Is Prior Authorization needed: no, per [facility] portal. Reference #: [id number].  Pt estimated Cost: $0   Coverage Details: For the primary MD Purchase option, Prolia and administration will be covered at 100%. No deductible, coinsurance or out of pocket max applies.

## 2023-06-12 NOTE — Telephone Encounter (Signed)
 Summary of benefits reviewed with patient and she verbalized understanding. Appointment for prolia injection scheduled for 06/14/23 at 0915. Patient agreeable to date and time of appointment. Order placed. Summary of benefits scanned into Epic. Encounter closed.

## 2023-06-14 ENCOUNTER — Ambulatory Visit

## 2023-06-14 DIAGNOSIS — M810 Age-related osteoporosis without current pathological fracture: Secondary | ICD-10-CM | POA: Diagnosis not present

## 2023-06-15 ENCOUNTER — Ambulatory Visit (INDEPENDENT_AMBULATORY_CARE_PROVIDER_SITE_OTHER): Admitting: Podiatry

## 2023-06-15 DIAGNOSIS — B351 Tinea unguium: Secondary | ICD-10-CM | POA: Diagnosis not present

## 2023-06-15 DIAGNOSIS — Z79899 Other long term (current) drug therapy: Secondary | ICD-10-CM | POA: Diagnosis not present

## 2023-06-15 NOTE — Progress Notes (Signed)
 Subjective:  Patient ID: Marie Peterson, female    DOB: 06-Sep-1965,  MRN: [id number]  Chief Complaint  Patient presents with   Nail Problem    Pt stated that she feels like her nail is trying to come off     58 y.o. female presents with the above complaint.  Patient presents with left hallux thickened and onychodystrophy mycotic nail x 1 mild pain on palpation hurts with ambulation worse with pressure she would like to discuss treatment options for nail fungus she has not seen anyone as prior to seeing me denies any other acute complaints   Review of Systems: Negative except as noted in the HPI. Denies N/V/F/Ch.  Past Medical History:  Diagnosis Date   Anxiety    Arthritis    Atrophic vaginitis    Atypical chest pain    thought to be GERD; myoview 05/17/07-no significant ischemia; echo 07/20/11- normal EF=>55%   Baker's cyst 03/28/2012   venous doppler 03/28/12=no thrombus; baker's cyst at left popliteal fossa   Chest pain    CIN I (cervical intraepithelial neoplasia I) 1990   Degenerative disc disease, cervical 2020   severe, C3-4, 4-5, 5-6. Currently receiving injections by Dr. Jordan Likes of preferred pain management   Depression    Fibroid    Fibromyalgia    Hypertension    Lupus nephritis (HCC)    Migraines    Neuropathy    Osteopenia 09/2017   T score -2.0 improved from prior study   Premature menopause age 54   following chemotherapy for Lupus   Shingles     Current Outpatient Medications:    terbinafine (LAMISIL) 250 MG tablet, Take 1 tablet (250 mg total) by mouth daily., Disp: 90 tablet, Rfl: 0   aspirin 81 MG tablet, Take 81 mg by mouth daily., Disp: , Rfl:    atorvastatin (LIPITOR) 40 MG tablet, TAKE 1 TABLET(40 MG) BY MOUTH DAILY, Disp: 90 tablet, Rfl: 3   BENLYSTA 200 MG/ML SOSY, Inject 4 mLs into the skin once a week., Disp: , Rfl:    benzonatate (TESSALON) 100 MG capsule, Take 100 mg by mouth 3 (three) times daily as needed for cough., Disp: , Rfl:     Cholecalciferol (VITAMIN D PO), Take 2,000 Int'l Units by mouth daily. , Disp: , Rfl:    denosumab (PROLIA) 60 MG/ML SOSY injection, Inject 60 mg into the skin every 6 (six) months., Disp: , Rfl:    diclofenac sodium (VOLTAREN) 1 % GEL, Apply topically., Disp: , Rfl:    FERREX 150 150 MG capsule, Take 1 tablet by mouth daily., Disp: , Rfl: 1   folic acid (FOLVITE) 400 MCG tablet, Take 400 mcg by mouth daily., Disp: , Rfl:    Galcanezumab-gnlm (EMGALITY) 120 MG/ML SOAJ, Inject 120 mg into the skin every 30 (thirty) days., Disp: 3 mL, Rfl: 3   hydrALAZINE (APRESOLINE) 50 MG tablet, Take 1 tablet (50 mg total) by mouth 3 (three) times daily., Disp: 270 tablet, Rfl: 3   HYDROcodone-acetaminophen (NORCO) 7.5-325 MG tablet, Take 1 tablet by mouth 4 (four) times daily as needed., Disp: , Rfl:    hydroxychloroquine (PLAQUENIL) 200 MG tablet, Take 200 mg by mouth 2 (two) times daily. , Disp: , Rfl: 0   L-Methylfolate-B6-B12 (METANX PO), Take 1 tablet by mouth 2 (two) times daily. , Disp: , Rfl:    LINZESS 145 MCG CAPS capsule, Take 145 mcg by mouth every morning., Disp: , Rfl:    loratadine (CLARITIN) 10 MG tablet, Take  10 mg by mouth daily., Disp: , Rfl:    LORazepam (ATIVAN) 0.5 MG tablet, Take 0.5 mg by mouth daily as needed. (Patient not taking: Reported on 06/19/2023), Disp: , Rfl:    losartan (COZAAR) 100 MG tablet, Take 100 mg by mouth daily., Disp: , Rfl:    magnesium oxide (MAG-OX) 400 (240 Mg) MG tablet, TAKE 1 TABLET(400 MG) BY MOUTH DAILY, Disp: 90 tablet, Rfl: 3   metoprolol succinate (TOPROL XL) 25 MG 24 hr tablet, Take 0.5 tablets (12.5 mg total) by mouth daily., Disp: 45 tablet, Rfl: 3   Multiple Vitamin (MULTIVITAMIN) tablet, Take 1 tablet by mouth daily., Disp: , Rfl:    mupirocin ointment (BACTROBAN) 2 %, Apply 1 Application topically 2 (two) times daily., Disp: 30 g, Rfl: 2   naratriptan (AMERGE) 2.5 MG tablet, Take 1 tablet (2.5 mg total) by mouth as needed for migraine. Take one (1)  tablet at onset of headache; if returns or does not resolve, may repeat after 4 hours; do not exceed five (5) mg in 24 hours., Disp: 10 tablet, Rfl: 11   NYSTATIN powder, APPLY 1 APPLICATION TOPICALLY TWICE DAILY, Disp: 15 g, Rfl: 1   Omega-3 Fatty Acids (FISH OIL) 1000 MG CAPS, Take 2 capsules by mouth daily., Disp: , Rfl:    omeprazole (PRILOSEC) 40 MG capsule, Take 1 capsule (40 mg total) by mouth in the morning and at bedtime., Disp: 60 capsule, Rfl: 5   ondansetron (ZOFRAN-ODT) 4 MG disintegrating tablet, Take 1 tablet (4 mg total) by mouth every 8 (eight) hours as needed for nausea or vomiting., Disp: 20 tablet, Rfl: 11   potassium chloride SA (KLOR-CON M) 20 MEQ tablet, TAKE 1 TABLET(20 MEQ) BY MOUTH TWICE DAILY, Disp: 180 tablet, Rfl: 3   predniSONE (DELTASONE) 20 MG tablet, Take 20 mg by mouth daily with breakfast., Disp: , Rfl:    pregabalin (LYRICA) 200 MG capsule, Take 200 mg by mouth daily., Disp: , Rfl:    PREVIDENT 5000 DRY MOUTH 1.1 % GEL dental gel, Place onto teeth as directed., Disp: , Rfl:    solifenacin (VESICARE) 5 MG tablet, Take 5 mg by mouth daily., Disp: , Rfl:    Suvorexant (BELSOMRA) 20 MG TABS, Take 20 mg by mouth Nightly. Take one tablet at bedtime daily, Disp: , Rfl:    tiZANidine (ZANAFLEX) 4 MG tablet, 1 tablet Orally Up to TID for 30 days, Disp: , Rfl:    tolterodine (DETROL LA) 4 MG 24 hr capsule, Take 4 mg by mouth daily., Disp: , Rfl:    torsemide (DEMADEX) 20 MG tablet, TAKE 2 TABLETS(40 MG) BY MOUTH TWICE DAILY, Disp: 360 tablet, Rfl: 3   traZODone (DESYREL) 150 MG tablet, Take 150 mg by mouth at bedtime., Disp: , Rfl:    Ubrogepant (UBRELVY) 100 MG TABS, Take 1 tablet (100 mg total) by mouth daily as needed. Take one tablet at onset of headache, may repeat 1 tablet in 2 hours, no more than 2 tablets in 24 hours, Disp: 10 tablet, Rfl: 11   XIIDRA 5 % SOLN, Place 1 drop into both eyes 2 (two) times daily., Disp: , Rfl:   Social History   Tobacco Use  Smoking  Status Never  Smokeless Tobacco Never    Allergies  Allergen Reactions   Ubrogepant Swelling    Lip swelling    Benazepril     Lip swelling   Cymbalta [Duloxetine Hcl]     Lip swelling   Hygroton [Chlorthalidone] Swelling  Swelling in lips   Mirabegron Itching    Other reaction(s): rash   Other     Other reaction(s): Unknown   Duloxetine Rash    Lip swelling   Objective:  There were no vitals filed for this visit. There is no height or weight on file to calculate BMI. Constitutional Well developed. Well nourished.  Vascular Dorsalis pedis pulses palpable bilaterally. Posterior tibial pulses palpable bilaterally. Capillary refill normal to all digits.  No cyanosis or clubbing noted. Pedal hair growth normal.  Neurologic Normal speech. Oriented to person, place, and time. Epicritic sensation to light touch grossly present bilaterally.  Dermatologic Nails left thickened and onychodystrophy mycotic nail x 1 left hallux Skin within normal limits  Orthopedic: Normal joint ROM without pain or crepitus bilaterally. No visible deformities. No bony tenderness.   Radiographs: None Assessment:   1. Long-term use of high-risk medication   2. Nail fungus   3. Onychomycosis due to dermatophyte    Plan:  Patient was evaluated and treated and all questions answered.  Left hallux onychomycosis -Educated the patient on the etiology of onychomycosis and various treatment options associated with improving the fungal load.  I explained to the patient that there is 3 treatment options available to treat the onychomycosis including topical, p.o., laser treatment.  Patient elected to undergo p.o. options with Lamisil/terbinafine therapy.  In order for me to start the medication therapy, I explained to the patient the importance of evaluating the liver and obtaining the liver function test.  Once the liver function test comes back normal I will start him on 62-month course of Lamisil  therapy.  Patient understood all risk and would like to proceed with Lamisil therapy.  I have asked the patient to immediately stop the Lamisil therapy if she has any reactions to it and call the office or go to the emergency room right away.  Patient states understanding   No follow-ups on file.

## 2023-06-16 LAB — HEPATIC FUNCTION PANEL
ALT: 18 IU/L (ref 0–32)
AST: 23 IU/L (ref 0–40)
Albumin: 4.2 g/dL (ref 3.8–4.9)
Alkaline Phosphatase: 110 IU/L (ref 44–121)
Bilirubin Total: 0.2 mg/dL (ref 0.0–1.2)
Bilirubin, Direct: 0.11 mg/dL (ref 0.00–0.40)
Total Protein: 6.4 g/dL (ref 6.0–8.5)

## 2023-06-18 ENCOUNTER — Encounter: Payer: Self-pay | Admitting: Cardiovascular Disease

## 2023-06-18 DIAGNOSIS — E66813 Obesity, class 3: Secondary | ICD-10-CM

## 2023-06-18 DIAGNOSIS — I5032 Chronic diastolic (congestive) heart failure: Secondary | ICD-10-CM

## 2023-06-18 MED ORDER — TERBINAFINE HCL 250 MG PO TABS
250.0000 mg | ORAL_TABLET | Freq: Every day | ORAL | 0 refills | Status: DC
Start: 2023-06-18 — End: 2023-08-31

## 2023-06-19 ENCOUNTER — Encounter: Payer: Self-pay | Admitting: Cardiovascular Disease

## 2023-06-19 ENCOUNTER — Ambulatory Visit: Attending: Cardiovascular Disease | Admitting: Cardiovascular Disease

## 2023-06-19 VITALS — BP 120/82 | HR 59 | Ht 64.0 in | Wt 240.2 lb

## 2023-06-19 DIAGNOSIS — I10 Essential (primary) hypertension: Secondary | ICD-10-CM | POA: Diagnosis not present

## 2023-06-19 DIAGNOSIS — E782 Mixed hyperlipidemia: Secondary | ICD-10-CM

## 2023-06-19 DIAGNOSIS — R931 Abnormal findings on diagnostic imaging of heart and coronary circulation: Secondary | ICD-10-CM | POA: Diagnosis not present

## 2023-06-19 MED ORDER — HYDRALAZINE HCL 50 MG PO TABS: 50.0000 mg | ORAL_TABLET | Freq: Three times a day (TID) | ORAL | 3 refills | Status: DC

## 2023-06-19 NOTE — Assessment & Plan Note (Signed)
 History of hyperlipidemia on statin therapy with lipid profile performed 03/26/2023 revealing total cholesterol 149, LDL 61 and HDL 66.

## 2023-06-19 NOTE — Assessment & Plan Note (Signed)
 History of essential hypertension with blood pressure measured today at 120/82.  She is on hydralazine and losartan.  She was initially tried on metoprolol for PACs but was discontinued because of bradycardia.

## 2023-06-19 NOTE — Patient Instructions (Signed)

## 2023-06-19 NOTE — Progress Notes (Signed)
 06/19/2023 Marie Peterson   Sep 05, 1965  [id number]  Primary Physician Linus Galas, NP Primary Cardiologist: Runell Gess MD Nicholes Calamity, [place]  HPI:  Marie Peterson is a 58 y.o.  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 Kandis Cocking. She currently is out on disability because of SLE. I last saw her in the office 08/29/22.[name] Kitchen  She has seen Marjie Skiff, PA-C several times since then.  Her factors include treated hypertension. Her father had bypass surgery at age 44. She's never had a heart attack or stroke. She denies chest pain or shortness of breath. She does have GERD. Since I saw her in the office one year ago she continues to have occasional atypical chest pain. A routine GXT performed 11/12/14 was entirely normal.    We obtained a 2D echo 12/28/2016 which was normal and a monitor 01/04/2017 that showed sinus bradycardia with heart rates in the 40s to 60s.  This was performed because of fatigue.  She does get some dyspnea on exertion and I think the majority of her symptoms are related to her rheumatologic disorders including SLE and fibromyalgia which is a recent diagnosis   She had a 2D echo performed 06/09/2020 which was essentially normal as well as a coronary CTA revealed a coronary calcium score of 38 with nonobstructive disease.    Since I saw her a year ago she is remained stable.  She states over the last year her lupus and fibromyalgia have "flared up".  She denies chest pain or shortness of breath.  She did have an event monitor ordered by Reather Littler NP that showed frequent PACs with a 10% burden.  She saw Dr. Nobie Putnam, EP, who started metoprolol however this was discontinued because of bradycardia.   Current Meds   Medication Sig   aspirin 81 MG tablet Take 81 mg by mouth daily.   atorvastatin (LIPITOR) 40 MG tablet TAKE 1 TABLET(40 MG) BY MOUTH DAILY   BENLYSTA 200 MG/ML SOSY Inject 4 mLs into the skin once a week.   benzonatate (TESSALON) 100 MG capsule Take 100 mg by mouth 3 (three) times daily as needed for cough.   Cholecalciferol (VITAMIN D PO) Take 2,000 Int'l Units by mouth daily.    denosumab (PROLIA) 60 MG/ML SOSY injection Inject 60 mg into the skin every 6 (six) months.   diclofenac sodium (VOLTAREN) 1 % GEL Apply topically.   FERREX 150 150 MG capsule Take 1 tablet by mouth daily.   folic acid (FOLVITE) 400 MCG tablet Take 400 mcg by mouth daily.   Galcanezumab-gnlm (EMGALITY) 120 MG/ML SOAJ Inject 120 mg into the skin every 30 (thirty) days.   hydrALAZINE (APRESOLINE) 50 MG tablet Take 1 tablet (50 mg total) by mouth 3 (three) times daily.   HYDROcodone-acetaminophen (NORCO) 7.5-325 MG tablet Take 1 tablet by mouth 4 (four) times daily as needed.   hydroxychloroquine (PLAQUENIL) 200 MG tablet Take 200 mg by mouth 2 (two) times daily.    L-Methylfolate-B6-B12 (METANX PO) Take 1 tablet by mouth 2 (two) times daily.    LINZESS 145 MCG CAPS capsule Take 145 mcg by mouth every morning.   loratadine (CLARITIN) 10 MG tablet Take 10 mg by mouth daily.   losartan (  COZAAR) 100 MG tablet Take 100 mg by mouth daily.   magnesium oxide (MAG-OX) 400 (240 Mg) MG tablet TAKE 1 TABLET(400 MG) BY MOUTH DAILY   metoprolol succinate (TOPROL XL) 25 MG 24 hr tablet Take 0.5 tablets (12.5 mg total) by mouth daily.   Multiple Vitamin (MULTIVITAMIN) tablet Take 1 tablet by mouth daily.   mupirocin ointment (BACTROBAN) 2 % Apply 1 Application topically 2 (two) times daily.   naratriptan (AMERGE) 2.5 MG tablet Take 1 tablet (2.5 mg total) by mouth as needed for migraine. Take one (1) tablet at onset of headache; if returns or does not resolve, may repeat after 4 hours; do not exceed five (5) mg in 24 hours.    NYSTATIN powder APPLY 1 APPLICATION TOPICALLY TWICE DAILY   Omega-3 Fatty Acids (FISH OIL) 1000 MG CAPS Take 2 capsules by mouth daily.   omeprazole (PRILOSEC) 40 MG capsule Take 1 capsule (40 mg total) by mouth in the morning and at bedtime.   ondansetron (ZOFRAN-ODT) 4 MG disintegrating tablet Take 1 tablet (4 mg total) by mouth every 8 (eight) hours as needed for nausea or vomiting.   potassium chloride SA (KLOR-CON M) 20 MEQ tablet TAKE 1 TABLET(20 MEQ) BY MOUTH TWICE DAILY   predniSONE (DELTASONE) 20 MG tablet Take 20 mg by mouth daily with breakfast.   pregabalin (LYRICA) 200 MG capsule Take 200 mg by mouth daily.   PREVIDENT 5000 DRY MOUTH 1.1 % GEL dental gel Place onto teeth as directed.   solifenacin (VESICARE) 5 MG tablet Take 5 mg by mouth daily.   Suvorexant (BELSOMRA) 20 MG TABS Take 20 mg by mouth Nightly. Take one tablet at bedtime daily   terbinafine (LAMISIL) 250 MG tablet Take 1 tablet (250 mg total) by mouth daily.   tiZANidine (ZANAFLEX) 4 MG tablet 1 tablet Orally Up to TID for 30 days   tolterodine (DETROL LA) 4 MG 24 hr capsule Take 4 mg by mouth daily.   torsemide (DEMADEX) 20 MG tablet TAKE 2 TABLETS(40 MG) BY MOUTH TWICE DAILY   traZODone (DESYREL) 150 MG tablet Take 150 mg by mouth at bedtime.   Ubrogepant (UBRELVY) 100 MG TABS Take 1 tablet (100 mg total) by mouth daily as needed. Take one tablet at onset of headache, may repeat 1 tablet in 2 hours, no more than 2 tablets in 24 hours   XIIDRA 5 % SOLN Place 1 drop into both eyes 2 (two) times daily.     Allergies  Allergen Reactions   Ubrogepant Swelling    Lip swelling    Benazepril     Lip swelling   Cymbalta [Duloxetine Hcl]     Lip swelling   Hygroton [Chlorthalidone] Swelling    Swelling in lips   Mirabegron Itching    Other reaction(s): rash   Other     Other reaction(s): Unknown   Duloxetine Rash    Lip swelling    Social History   Socioeconomic History   Marital status: Married    Spouse  name: Thereasa Distance   Number of children: 2   Years of education: College   Highest education level: Not on file  Occupational History    Employer: DISABLED    Comment: disability  Tobacco Use   Smoking status: Never   Smokeless tobacco: Never  Vaping Use   Vaping status: Never Used  Substance and Sexual Activity   Alcohol use: No    Alcohol/week: 0.0 standard drinks of alcohol   Drug use: No  Sexual activity: Not Currently    Birth control/protection: Surgical, Post-menopausal    Comment: BTL, 1st intercourse 58 yo-Fewer than 5 partners  Other Topics Concern   Not on file  Social History Narrative   Patient lives at home with her daughter. She is currently separated (not legally).   Caffeine Use: 1 cup of coffee daily.    Right-handed   Social Drivers of Corporate investment banker Strain: Not on file  Food Insecurity: Not on file  Transportation Needs: Not on file  Physical Activity: Not on file  Stress: Not on file  Social Connections: Not on file  Intimate Partner Violence: Not on file     Review of Systems: General: negative for chills, fever, night sweats or weight changes.  Cardiovascular: negative for chest pain, dyspnea on exertion, edema, orthopnea, palpitations, paroxysmal nocturnal dyspnea or shortness of breath Dermatological: negative for rash Respiratory: negative for cough or wheezing Urologic: negative for hematuria Abdominal: negative for nausea, vomiting, diarrhea, bright red blood per rectum, melena, or hematemesis Neurologic: negative for visual changes, syncope, or dizziness All other systems reviewed and are otherwise negative except as noted above.    Blood pressure 120/82, pulse (!) 59, height 5\' 4"  (1.626 m), weight 240 lb 3.2 oz (109 kg), SpO2 94%.  General appearance: alert and no distress Neck: no adenopathy, no carotid bruit, no JVD, supple, symmetrical, trachea midline, and thyroid not enlarged, symmetric, no tenderness/mass/nodules Lungs:  clear to auscultation bilaterally Heart: regular rate and rhythm, S1, S2 normal, no murmur, click, rub or gallop Extremities: extremities normal, atraumatic, no cyanosis or edema Pulses: 2+ and symmetric Skin: Skin color, texture, turgor normal. No rashes or lesions Neurologic: Grossly normal  EKG not performed today      ASSESSMENT AND PLAN:   Essential hypertension History of essential hypertension with blood pressure measured today at 120/82.  She is on hydralazine and losartan.  She was initially tried on metoprolol for PACs but was discontinued because of bradycardia.  Hyperlipidemia History of hyperlipidemia on statin therapy with lipid profile performed 03/26/2023 revealing total cholesterol 149, LDL 61 and HDL 66.  Elevated coronary artery calcium score Coronary calcium score performed 06/09/2020 was 38.  She is at goal for secondary prevention.  She denies chest pain.     Runell Gess MD FACP,FACC,FAHA, [facility] 06/19/2023 9:32 AM

## 2023-06-19 NOTE — Assessment & Plan Note (Signed)
 Coronary calcium score performed 06/09/2020 was 38.  She is at goal for secondary prevention.  She denies chest pain.

## 2023-07-05 ENCOUNTER — Other Ambulatory Visit: Payer: Self-pay

## 2023-07-05 DIAGNOSIS — Z1231 Encounter for screening mammogram for malignant neoplasm of breast: Secondary | ICD-10-CM

## 2023-07-30 ENCOUNTER — Encounter: Payer: Self-pay | Admitting: Cardiovascular Disease

## 2023-07-30 DIAGNOSIS — I10 Essential (primary) hypertension: Secondary | ICD-10-CM

## 2023-07-30 MED ORDER — HYDRALAZINE HCL 50 MG PO TABS: ORAL_TABLET | ORAL | 3 refills | Status: DC

## 2023-07-30 NOTE — Telephone Encounter (Signed)
 Avanell Leigh, MD to [facility] Triage     07/30/23  5:12 PM Seems like her high readings are in the morning. Increase morning dose of hydralazine  50--->75 mg. Have her keep a 30 day BP log then appt with a Pharm D to review  Patient identification verified by 2 forms. Hilton Lucky, RN    Called and spoke to patient  Relayed provider message  Patient aware:   -increase hydralazine  to 75 MG in the morning   -continue taking hydralazine  50 MG in the afternoon and morning   -keep 30 day BP log   -referral place for Pharm D HTN, she will be outreached for follow up   -New Rx sent to preferred pharmacy  Patient verbalized understanding, no questions at this time

## 2023-07-30 NOTE — Addendum Note (Signed)
 Addended by: Hilton Lucky on: 07/30/2023 05:25 PM   Modules accepted: Orders

## 2023-08-03 ENCOUNTER — Ambulatory Visit
Admission: RE | Admit: 2023-08-03 | Discharge: 2023-08-03 | Disposition: A | Discharge status: Home / Self Care | Source: Ambulatory Visit

## 2023-08-03 DIAGNOSIS — Z1231 Encounter for screening mammogram for malignant neoplasm of breast: Secondary | ICD-10-CM

## 2023-08-15 ENCOUNTER — Encounter: Payer: Self-pay | Admitting: Pharmacist Clinician (PhC)/ Clinical Pharmacy Specialist

## 2023-08-15 ENCOUNTER — Other Ambulatory Visit (HOSPITAL_COMMUNITY): Payer: Self-pay

## 2023-08-15 ENCOUNTER — Telehealth: Payer: Self-pay | Admitting: Pharmacist Clinician (PhC)/ Clinical Pharmacy Specialist

## 2023-08-15 ENCOUNTER — Ambulatory Visit: Attending: Cardiology | Admitting: Pharmacist Clinician (PhC)/ Clinical Pharmacy Specialist

## 2023-08-15 ENCOUNTER — Telehealth: Payer: Self-pay | Admitting: Pharmacy Technician

## 2023-08-15 VITALS — BP 112/82 | HR 48 | Ht 65.0 in | Wt 230.2 lb

## 2023-08-15 DIAGNOSIS — I10 Essential (primary) hypertension: Secondary | ICD-10-CM | POA: Diagnosis not present

## 2023-08-15 DIAGNOSIS — E669 Obesity, unspecified: Secondary | ICD-10-CM | POA: Insufficient documentation

## 2023-08-15 MED ORDER — HYDRALAZINE HCL 25 MG PO TABS: ORAL_TABLET | ORAL | 6 refills | Status: AC

## 2023-08-15 NOTE — Telephone Encounter (Signed)
 Pharmacy Patient Advocate Encounter   Received notification from Pt Calls Messages that prior authorization for Zepbound is required/requested.   Insurance verification completed.   The patient is insured through [facility] .   Per test claim: PA required; PA submitted to above mentioned insurance via CoverMyMeds Key/confirmation #/EOC [id number] Status is pending

## 2023-08-15 NOTE — Assessment & Plan Note (Signed)
  Patient has not met goal of at least 5% of body weight loss with comprehensive lifestyle modifications alone in the past 3-6 months. Pharmacotherapy is appropriate to pursue as augmentation. Will start GLP1 therapy - either Wegovy or Zepbound, dependent on insurance coverage.   Advised patient on common side effects including nausea, diarrhea, dyspepsia, decreased appetite, and fatigue. Counseled patient on reducing meal size and how to titrate medication to minimize side effects. Patient aware to call if intolerable side effects or if experiencing dehydration, abdominal pain, or dizziness. Patient will adhere to dietary modifications and will target at least 150 minutes of moderate intensity exercise weekly.   Injection technique reviewed at today's visit.    Titration Plan:  Will plan to follow monthly titration or as tolerated by patient.  Can slow titration if needed for tolerability.      Follow up in 3 months.

## 2023-08-15 NOTE — Patient Instructions (Signed)
 Decrease hydralazine  to 50 mg in the mornings and 25 mg in the afternoon and evening.   Continue to monitor home BP readings  We will start the prior authorization process to get Zepbound or Wegovy covered by your insurance.   TIPS FOR SUCCESS Write down the reasons why you want to lose weight and post it in a place where you'll see it often. Start small and work your way up. Keep in mind that it takes time to achieve goals, and small steps add up. Any additional movements help to burn calories. Taking the stairs rather than the elevator and parking at the far end of your parking lot are easy ways to start. Brisk walking for at least 30 minutes 4 or more days of the week is an excellent goal to work toward  [employer] WHAT IT MEANS TO FEEL FULL Did you know that it can take 15 minutes or more for your brain to receive the message that you've eaten? That means that, if you eat less food, but consume it slower, you may still feel satisfied. Eating a lot of fruits and vegetables can also help you feel fuller. Eat off of smaller plates so that moderate portions don't seem too small  TITRATION PLAN Will plan to follow the titration plan depending on which medication we choose.  Dose increases are based on tolerability Can slow titration if needed for tolerability.    Follow up in 3 months.  If you have any questions or concerns, please reach out to us  via MyChart  THANK YOU FOR CHOOSING [facility]

## 2023-08-15 NOTE — Progress Notes (Signed)
 Office Visit    Patient Name: Marie Peterson Date of Encounter: 08/15/2023  Primary Care Provider:  Yvonnie Heritage, NP Primary Cardiologist:  Lauro Portal, MD  Chief Complaint    Weight management  Significant Past Medical History   CAD Non-obstructive on CTA  bradycardia HR runs 40-60 range  CHF HFpEF  palpitations Zio showed frequent PAC's with short bursts of SVT, not overly bothersome to patient; on metoprolol  succ 12.5  HLD 12/24 LDL 71on atorvastatin  40  SLE W/lupus nephritis    Allergies  Allergen Reactions   Ubrogepant  Swelling    Lip swelling    Benazepril      Lip swelling   Cymbalta [Duloxetine Hcl]     Lip swelling   Hygroton  [Chlorthalidone ] Swelling    Swelling in lips   Mirabegron Itching    Other reaction(s): rash   Other     Other reaction(s): Unknown   Duloxetine Rash    Lip swelling    History of Present Illness    Marie Peterson is a 58 y.o. female patient of Dr Katheryne Pane, in the office today to discuss options for weight management.  I have seen her several times in the past for hypertension management, and she has a few questions about that as well.    Current weight management medications: none  Previously tried meds: none  Current meds that may affect weight: prednisone  Baseline weight/BMI: 104.2 kg // 38.31  Insurance payor: [employer]  Diet:   mostly home cooke, rarely eat out; occasional salt with cooking but not at the table, vegetables mix of ffc (rarely canned); protein mostly chicken and fish   Exercise:  no, walks when able (joint problems, swelling in legs, plantar fascitis)  Family History: mother had hypertension, COPD, asthma, still living, father hypertension, died non heart related; siblings healthy overall, 2 daughters healthy   Social History:   Tobacco: no             Alcohol : no             Caffeine: 1 coffee in the mornings home brewed  Accessory Clinical Findings    Lab Results  Component Value Date    CREATININE 0.84 02/26/2023   BUN 20 02/26/2023   NA 147 (H) 02/26/2023   K 3.9 02/26/2023   CL 104 02/26/2023   CO2 24 02/26/2023   Lab Results  Component Value Date   ALT 18 06/15/2023   AST 23 06/15/2023   ALKPHOS 110 06/15/2023   BILITOT 0.2 06/15/2023   Lab Results  Component Value Date   HGBA1C 5.4 09/10/2018    Home Medications/Allergies    Current Outpatient Medications  Medication Sig Dispense Refill   aspirin 81 MG tablet Take 81 mg by mouth daily.     atorvastatin  (LIPITOR) 40 MG tablet TAKE 1 TABLET(40 MG) BY MOUTH DAILY 90 tablet 3   BENLYSTA 200 MG/ML SOSY Inject 4 mLs into the skin once a week.     benzonatate (TESSALON) 100 MG capsule Take 100 mg by mouth 3 (three) times daily as needed for cough.     Cholecalciferol (VITAMIN D  PO) Take 2,000 Int'l Units by mouth daily.      denosumab  (PROLIA ) 60 MG/ML SOSY injection Inject 60 mg into the skin every 6 (six) months.     diclofenac sodium (VOLTAREN) 1 % GEL Apply topically.     FERREX 150 150 MG capsule Take 1 tablet by mouth daily.  1  folic acid  (FOLVITE ) 400 MCG tablet Take 400 mcg by mouth daily.     Galcanezumab -gnlm (EMGALITY ) 120 MG/ML SOAJ Inject 120 mg into the skin every 30 (thirty) days. 3 mL 3   hydrALAZINE  (APRESOLINE ) 50 MG tablet Take 1.5 tablets (75 mg total) by mouth in the morning AND 1 tablet (50 mg total) daily in the afternoon AND 1 tablet (50 mg total) every evening. 315 tablet 3   HYDROcodone -acetaminophen  (NORCO) 7.5-325 MG tablet Take 1 tablet by mouth 4 (four) times daily as needed.     hydroxychloroquine (PLAQUENIL) 200 MG tablet Take 200 mg by mouth 2 (two) times daily.   0   L-Methylfolate-B6-B12 (METANX PO) Take 1 tablet by mouth 2 (two) times daily.      LINZESS 145 MCG CAPS capsule Take 145 mcg by mouth every morning.     loratadine (CLARITIN) 10 MG tablet Take 10 mg by mouth daily.     LORazepam (ATIVAN) 0.5 MG tablet Take 0.5 mg by mouth daily as needed. (Patient not taking:  Reported on 06/19/2023)     losartan  (COZAAR ) 100 MG tablet Take 100 mg by mouth daily.     magnesium  oxide (MAG-OX) 400 (240 Mg) MG tablet TAKE 1 TABLET(400 MG) BY MOUTH DAILY 90 tablet 3   metoprolol  succinate (TOPROL  XL) 25 MG 24 hr tablet Take 0.5 tablets (12.5 mg total) by mouth daily. 45 tablet 3   Multiple Vitamin (MULTIVITAMIN) tablet Take 1 tablet by mouth daily.     mupirocin  ointment (BACTROBAN ) 2 % Apply 1 Application topically 2 (two) times daily. 30 g 2   naratriptan  (AMERGE) 2.5 MG tablet Take 1 tablet (2.5 mg total) by mouth as needed for migraine. Take one (1) tablet at onset of headache; if returns or does not resolve, may repeat after 4 hours; do not exceed five (5) mg in 24 hours. 10 tablet 11   NYSTATIN  powder APPLY 1 APPLICATION TOPICALLY TWICE DAILY 15 g 1   Omega-3 Fatty Acids (FISH OIL) 1000 MG CAPS Take 2 capsules by mouth daily.     omeprazole  (PRILOSEC) 40 MG capsule Take 1 capsule (40 mg total) by mouth in the morning and at bedtime. 60 capsule 5   ondansetron  (ZOFRAN -ODT) 4 MG disintegrating tablet Take 1 tablet (4 mg total) by mouth every 8 (eight) hours as needed for nausea or vomiting. 20 tablet 11   potassium chloride  SA (KLOR-CON  M) 20 MEQ tablet TAKE 1 TABLET(20 MEQ) BY MOUTH TWICE DAILY 180 tablet 3   predniSONE (DELTASONE) 20 MG tablet Take 20 mg by mouth daily with breakfast.     pregabalin  (LYRICA ) 200 MG capsule Take 200 mg by mouth daily.     PREVIDENT 5000 DRY MOUTH 1.1 % GEL dental gel Place onto teeth as directed.     solifenacin (VESICARE) 5 MG tablet Take 5 mg by mouth daily.     Suvorexant (BELSOMRA) 20 MG TABS Take 20 mg by mouth Nightly. Take one tablet at bedtime daily     terbinafine  (LAMISIL ) 250 MG tablet Take 1 tablet (250 mg total) by mouth daily. 90 tablet 0   tiZANidine (ZANAFLEX) 4 MG tablet 1 tablet Orally Up to TID for 30 days     tolterodine (DETROL LA) 4 MG 24 hr capsule Take 4 mg by mouth daily.     torsemide  (DEMADEX ) 20 MG tablet  TAKE 2 TABLETS(40 MG) BY MOUTH TWICE DAILY 360 tablet 3   traZODone (DESYREL) 150 MG tablet Take 150 mg by  mouth at bedtime.     Ubrogepant  (UBRELVY ) 100 MG TABS Take 1 tablet (100 mg total) by mouth daily as needed. Take one tablet at onset of headache, may repeat 1 tablet in 2 hours, no more than 2 tablets in 24 hours 10 tablet 11   XIIDRA 5 % SOLN Place 1 drop into both eyes 2 (two) times daily.     No current facility-administered medications for this visit.     Allergies  Allergen Reactions   Ubrogepant  Swelling    Lip swelling    Benazepril      Lip swelling   Cymbalta [Duloxetine Hcl]     Lip swelling   Hygroton  [Chlorthalidone ] Swelling    Swelling in lips   Mirabegron Itching    Other reaction(s): rash   Other     Other reaction(s): Unknown   Duloxetine Rash    Lip swelling    Assessment & Plan    Obesity (BMI 30-39.9)  Patient has not met goal of at least 5% of body weight loss with comprehensive lifestyle modifications alone in the past 3-6 months. Pharmacotherapy is appropriate to pursue as augmentation. Will start GLP1 therapy - either Wegovy or Zepbound, dependent on insurance coverage.   Advised patient on common side effects including nausea, diarrhea, dyspepsia, decreased appetite, and fatigue. Counseled patient on reducing meal size and how to titrate medication to minimize side effects. Patient aware to call if intolerable side effects or if experiencing dehydration, abdominal pain, or dizziness. Patient will adhere to dietary modifications and will target at least 150 minutes of moderate intensity exercise weekly.   Injection technique reviewed at today's visit.    Titration Plan:  Will plan to follow monthly titration or as tolerated by patient.  Can slow titration if needed for tolerability.      Follow up in 3 months.   Essential hypertension Assessment: BP is controlled in office BP 112/82  mmHg;  above the goal (<130/80). Having more SLE flares  since increasing dose of hydralazine .   Reiterated the importance of regular exercise and low salt diet   Plan:  Decrease hydralazine  to 50 mg in the mornings and 25 mg in afternoon and evening Continue taking losartan  100 mg qd Patient to keep record of BP readings with heart rate and report to us  at the next visit Patient to follow up with PharmD in 2-3 months  Labs ordered today:  none   Donivan Furry PharmD [facility]  [address] [place], [place] [address] [phone]

## 2023-08-15 NOTE — Telephone Encounter (Signed)
 Please do PA for Zepbound (or [id number])

## 2023-08-15 NOTE — Telephone Encounter (Signed)
 Pharmacy Patient Advocate Encounter  Received notification from [facility] that Prior Authorization for Zepbound has been DENIED.  Full denial letter will be uploaded to the media tab. See denial reason below.   PA #/Case ID/Reference #: [id number]

## 2023-08-15 NOTE — Assessment & Plan Note (Signed)
 Assessment: BP is controlled in office BP 112/82  mmHg;  above the goal (<130/80). Having more SLE flares since increasing dose of hydralazine .   Reiterated the importance of regular exercise and low salt diet   Plan:  Decrease hydralazine  to 50 mg in the mornings and 25 mg in afternoon and evening Continue taking losartan  100 mg qd Patient to keep record of BP readings with heart rate and report to us  at the next visit Patient to follow up with PharmD in 2-3 months  Labs ordered today:  none

## 2023-08-19 NOTE — Telephone Encounter (Signed)
 Probably also a no, but would you try [place] please.

## 2023-08-21 ENCOUNTER — Other Ambulatory Visit (HOSPITAL_COMMUNITY): Payer: Self-pay

## 2023-08-21 ENCOUNTER — Telehealth: Payer: Self-pay | Admitting: Pharmacy Technician

## 2023-08-21 NOTE — Telephone Encounter (Signed)
 Pharmacy Patient Advocate Encounter   Received notification from Pt Calls Messages that prior authorization for wegovy is required/requested.   Insurance verification completed.   The patient is insured through [employer] .   Per test claim: PA required; PA submitted to above mentioned insurance via CoverMyMeds Key/confirmation #/EOC [id number] Status is pending

## 2023-08-21 NOTE — Telephone Encounter (Signed)
 Pharmacy Patient Advocate Encounter  Received notification from [employer] that Prior Authorization for wegovy has been DENIED.  Full denial letter will be uploaded to the media tab. See denial reason below.   PA #/Case ID/Reference #: [id number]

## 2023-08-22 ENCOUNTER — Encounter: Payer: Self-pay | Admitting: Pharmacist Clinician (PhC)/ Clinical Pharmacy Specialist

## 2023-08-23 NOTE — Progress Notes (Signed)
 Chief Complaint  Patient presents with   Follow-up    RM 1, alone.  Last seen 01/03/23.  Migraine f/u. Preventative: Emgality . Rescue: Ubrelvy , zofran . Has been sick last two weeks and has had migraine daily. Prior, was getting 1 migraine per week.  Ubrelvy  helps with severity but does not take away migraine. Feels it helps more than the Nurtec.    HISTORY OF PRESENT ILLNESS:  08/27/23 ALL:  Marie Peterson returns for follow up for migraines. She was last seen 12/2022 and we switched Qulipta  to Emgality  and Nurtec switched to Ubrelvy . Since, she reports about 60% improvement in migraine days. Now having 4-5 per month. She usually has to take two doses of Ubrelvy  but reports it does help. Previously had one episode of lip swelling but now feels it was related to a different medication and not Ubrelvy  as she has tolerated it well. Rarely needing to take Amerge. She has been ill, recently. Hydralazine  was increased by cardiology but decreased by pharm D after difficulty with extreme fatigue, nausea and low heart rate. Also treated for a UTI.   01/03/2023 ALL:  Marie Peterson returns for follow up for migraines. She continues Qulipta  for prevention and Nurtec and or naratriptan  for abortive therapy. She reports headaches were fairly well managed until about 1-2 months ago. Now having nearly daily migraines. Naratriptan  helps some. Nurtec not effective. Mom recently diagnosed with CHF. Polypharmacy concerning as possible contributor to migraines. PSG in 2021 negative for sleep breathing disorders.   She is willing to try another injection. Previously fearful of Ajovy  injections but now feels she is able to tolerate.   Meds tried and failed: Qulipta  (lost effectiveness), Ajovy  (not covered and fear of injections), topiramate , trazodone, gabapentin , Lyrica , venlafaxine , nortriptyline , sertraline, divalproex , magnesium , prednisone, rizatriptan , sumatriptan , eletriptan , naratriptan , Nurtec,   01/02/2022 ALL:  Marie Peterson returns  for migraine follow up. She continues Qulipta  and Amerge. Nurtec used for intractable headaches. She reports doing well. She may have 4-6 migraine days a month. Abortive meds work well. She is followed by PCP regularly. She has multiple specialists as well. She denies history of bradycardia but there is history in chart. She was recently started on Belbuca for pain management. She denies palpitations, dizziness or lightheadedness. She is followed by cardiology for HTN.   01/19/2022 ALL: Marie Peterson returns for follow up for migraines. We started her on Qulipta  due to breakthrough migraines on Ajovy . She does feel that Nurtec and naratriptan  work well. She may have 8-12 headache days a month but only 2-3 are severe. She takes either Nurtec or naratriptan  and usually migraine is easily aborted. She continues to see PCP and psychiatry regularly.   07/19/2020 ALL:  She returns for follow up for migraines. She continues Ajovy  injections. She feels that these have helped significantly. She does feel that Ajovy  wears off towards the end of the cycle (getting 3inj/21mths). She has about 5-6 migraines a month. Amerge works well for abortive therapy. Gabapentin  was switched Lyrica . She is now taking 200mg  BID. Insurance no longer covers Ajovy . She is requesting to switch to oral CGRP due to fears of injections.   01/15/2020 ALL:  Marie Peterson is a 58 y.o. female here today for follow up for migraines. Sleep eval was normal. She continues  Ajovy  and gabapentin  600/600/1200. She was started on Effexor  by psychiatry but had an allergic reaction. She is now taking sertraline. She continues Nurtec for abortive therapy and feels that it helps with pain but does not abort  migraine. She continues to have daily tension headaches. She has about 8 migraines per month. She admits stress is contributor. She is followed closely by Noble Bateman. She has chronic pain on oxycodone TID.   HISTORY (copied from Dr Jetta Morrow note on  09/11/2019)  MALEEYAH Peterson is a 58  year-old [place] or [place] American female patient established with Dr. Tresia Fruit and seen here upon a referral on 09/11/2019 from Dr Burdette Carolin for sleep disordered breathing. Chief concern according to patient :   Mrs. Jonni Nettle reports that her daughter has slept for a while at her house and noticed her to twitch and move during her sleep may be more of a trembling.  She also noted irregular breathing and pauses in her sleep breathing.  For this reason she is referred for evaluation of possible sleep apnea.  Dr. Vanita Gens referred.   I reviewed the patient's medication she is on Percocet, she is still on oxycodone.   Prednisone, amlodipine  Benzapril, alendronate , omeprazole , Tessalon Perles as needed furosemide  40 mg actually 1.5 tablets in the morning and 1 in the night, hydralazine  twice a day, no tach, Belsomra 20 mg Trintellix 10 mg not also on gabapentin  3 times daily. A lot of medications with sleep inducing or sleep changing potential.   Marie Peterson is a right -handed Black or [place] American female with a possible sleep disorder.  She has a past medical history of Lupus, Arthritis, Atrophic vaginitis, Atypical chest pain, Baker's cyst (03/28/12), Chest pain, CIN I (cervical intraepithelial neoplasia I) (1990), Degenerative disc disease, cervical (2020), Fibroid, "Fibromyalgia", Hypertension, Lupus nephritis (HCC), Migraines, Neuropathy, Osteopenia (09/2017), Premature menopause (age 45), and Shingles.    Sleep relevant medical history: Nocturia- diuretic induced 3-4 times , No Sleep walking, sinus surgery    Family medical /sleep history: niece is another family member on CPAP with OSA.   Social history:  Patient is disabled  / retired from Therapist, nutritional  and lives in a household with her daughter. Family status is separated. Tobacco use- none .  ETOH use ;none , Caffeine intake in form of Coffee( 1 cup daily  Soda( none) Tea ( rare ) nor energy drinks. Regular  exercise - none .   Sleep habits are as follows: The patient's dinner time is between 6 PM. The patient goes to bed at variable times , 10-11 PM and struggles to sleep-once asleep continues to sleep for 1-3 hours, wakes for many bathroom breaks.   The preferred sleep position is supine of on her side, with the support of 2 pillows. She sleeps with a boot, plantar fasciitis.   Dreams are reportedly frequent.   8.30 AM is the usual rise time. The patient wakes up spontaneously between 4.30 and 5 AM , but tries to get more sleep.   She reports not feeling refreshed or restored in AM, with symptoms such as dry mouth , morning headaches , and residual fatigue. Naps are taken frequently, lasting from 1-2 hours , feels these are less refreshing than nocturnal sleep. She reportedly yawns al through the day.    "Addendum Kevina Piloto/ Dr Tresia Fruit have followed for Migraine and here is the last note from 03-11-2019: Marie Peterson is a 58 y.o. female here today for follow up. She is under more stress recently with her dad's new diagnosis of prostate and brain cancer. She is one of 12 children and feels that they all look to her for support. She has noticed headaches are worse. Same headache  but more frequent. She continues to do well with Ajovy  every three months, gabapentin  600mg  three times daily and Nurtec for abortive therapy. BP has been elevated. She is currently separated from her husband. She is followed by Noble Bateman. Effexor  was recently switched to Trintellix. She feels that Trintellix is making her sick on her stomach. She is followed closely by  PCP as well. She is taking hydralazine , amlodipine  and benazepril  for HTN. She is on chronic opioids for pain management. "  History (copied from my note on 03/11/2019)  Marie Peterson is a 58 y.o. female here today for follow up. She is under more stress recently with her dad's new diagnosis of prostate and brain cancer. She is one of 12 children and feels that they  all look to her for support. She has noticed headaches are worse. Same headache but more frequent. She continues to do well with Ajovy  every three months, gabapentin  600mg  three times daily and Nurtec for abortive therapy. BP has been elevated. She is currently separated from her husband. She is followed by Noble Bateman. Effexor  was recently switched to Trintellix. She feels that Trintellix is making her sick on her stomach. She is followed closely by  PCP as well. She is taking hydralazine , amlodipine  and benazepril  for HTN. She is on chronic opioids for pain management.    HISTORY: (copied from my note on 10/31/2018)   ESMIRNA RAVAN is a 58 y.o. female here today for follow up. She has had an increase in migraines. She is having daily headaches. She reports at least 2-3 migrainous headaches each week with light/sound sensitivity and nausea. Heat and stress make headaches worse. She is going through a separation currently and has noticed migraines are worse over the past month.  She is using Ajovy  injections (3injections every three months). She is taking gabapentin  600mg  TID for neuropathy. She is taking Percocet 7.5/325mg  for severe DDD prescribed by PCP. She is also seeing rheumatology for Lupus.  She uses Zomig for abortive therapy that does ease up the headache. She occasionally takes Zofran  for nausea with migraine.    She is taking Belsomra for sleep. She does not feel it is helping her. She continues to wake frequently throughout the night. She does not snore. She is admittedly more depressed. She has not spoken with PCP due to him being out of the office for medical leave. She is not taking any antidepressants at this time. She does not remember being on anything in the past with the exception of Cymbalta for pain. This medications did cause mild lip swelling. No respiratory involvement , no swollen tongue or airway. No trouble breathing.    REVIEW OF SYSTEMS: Out of a complete 14 system review of  symptoms, the patient complains only of the following symptoms, headaches, chronic pain, anxiety and depression and all other reviewed systems are negative.   ALLERGIES: Allergies  Allergen Reactions   Ubrogepant  Swelling    Lip swelling    Benazepril      Lip swelling   Cymbalta [Duloxetine Hcl]     Lip swelling   Hygroton  [Chlorthalidone ] Swelling    Swelling in lips   Mirabegron Itching    Other reaction(s): rash   Other     Other reaction(s): Unknown   Duloxetine Rash    Lip swelling     HOME MEDICATIONS: Outpatient Medications Prior to Visit  Medication Sig Dispense Refill   aspirin 81 MG tablet Take 81 mg by mouth daily.  atorvastatin  (LIPITOR) 40 MG tablet TAKE 1 TABLET(40 MG) BY MOUTH DAILY 90 tablet 3   BENLYSTA 200 MG/ML SOSY Inject 4 mLs into the skin once a week.     Cholecalciferol (VITAMIN D  PO) Take 2,000 Int'l Units by mouth daily.      denosumab  (PROLIA ) 60 MG/ML SOSY injection Inject 60 mg into the skin every 6 (six) months.     diclofenac sodium (VOLTAREN) 1 % GEL Apply topically.     FERREX 150 150 MG capsule Take 1 tablet by mouth daily.  1   folic acid  (FOLVITE ) 400 MCG tablet Take 400 mcg by mouth daily.     Galcanezumab -gnlm (EMGALITY ) 120 MG/ML SOAJ Inject 120 mg into the skin every 30 (thirty) days. 3 mL 3   hydrALAZINE  (APRESOLINE ) 25 MG tablet Take 2 tablets by mouth each morning, then 1 tablet midday and 1 tablet at night 120 tablet 6   HYDROcodone -acetaminophen  (NORCO) 7.5-325 MG tablet Take 1 tablet by mouth 4 (four) times daily as needed.     hydroxychloroquine (PLAQUENIL) 200 MG tablet Take 200 mg by mouth 2 (two) times daily.   0   L-Methylfolate-B6-B12 (METANX PO) Take 1 tablet by mouth 2 (two) times daily.      LINZESS 145 MCG CAPS capsule Take 145 mcg by mouth every morning.     loratadine (CLARITIN) 10 MG tablet Take 10 mg by mouth daily.     losartan  (COZAAR ) 100 MG tablet Take 100 mg by mouth daily.     magnesium  oxide (MAG-OX) 400  (240 Mg) MG tablet TAKE 1 TABLET(400 MG) BY MOUTH DAILY 90 tablet 3   Multiple Vitamin (MULTIVITAMIN) tablet Take 1 tablet by mouth daily.     naratriptan  (AMERGE) 2.5 MG tablet Take 1 tablet (2.5 mg total) by mouth as needed for migraine. Take one (1) tablet at onset of headache; if returns or does not resolve, may repeat after 4 hours; do not exceed five (5) mg in 24 hours. 10 tablet 11   NYSTATIN  powder APPLY 1 APPLICATION TOPICALLY TWICE DAILY 15 g 1   Omega-3 Fatty Acids (FISH OIL) 1000 MG CAPS Take 2 capsules by mouth daily.     omeprazole  (PRILOSEC) 40 MG capsule Take 1 capsule (40 mg total) by mouth in the morning and at bedtime. 60 capsule 5   ondansetron  (ZOFRAN -ODT) 4 MG disintegrating tablet Take 1 tablet (4 mg total) by mouth every 8 (eight) hours as needed for nausea or vomiting. 20 tablet 11   potassium chloride  SA (KLOR-CON  M) 20 MEQ tablet TAKE 1 TABLET(20 MEQ) BY MOUTH TWICE DAILY 180 tablet 3   predniSONE (DELTASONE) 20 MG tablet Take 20 mg by mouth daily with breakfast.     pregabalin  (LYRICA ) 200 MG capsule Take 200 mg by mouth daily.     PREVIDENT 5000 DRY MOUTH 1.1 % GEL dental gel Place onto teeth as directed.     solifenacin (VESICARE) 5 MG tablet Take 5 mg by mouth daily.     terbinafine  (LAMISIL ) 250 MG tablet Take 1 tablet (250 mg total) by mouth daily. 90 tablet 0   tiZANidine (ZANAFLEX) 4 MG tablet 1 tablet Orally Up to TID for 30 days     tolterodine (DETROL LA) 4 MG 24 hr capsule Take 4 mg by mouth daily.     torsemide  (DEMADEX ) 20 MG tablet TAKE 2 TABLETS(40 MG) BY MOUTH TWICE DAILY 360 tablet 3   traZODone (DESYREL) 150 MG tablet Take 150 mg by mouth at  bedtime.     Ubrogepant  (UBRELVY ) 100 MG TABS Take 1 tablet (100 mg total) by mouth daily as needed. Take one tablet at onset of headache, may repeat 1 tablet in 2 hours, no more than 2 tablets in 24 hours 10 tablet 11   XIIDRA 5 % SOLN Place 1 drop into both eyes 2 (two) times daily.     benzonatate (TESSALON) 100  MG capsule Take 100 mg by mouth 3 (three) times daily as needed for cough.     LORazepam (ATIVAN) 0.5 MG tablet Take 0.5 mg by mouth daily as needed. (Patient not taking: Reported on 06/19/2023)     metoprolol  succinate (TOPROL  XL) 25 MG 24 hr tablet Take 0.5 tablets (12.5 mg total) by mouth daily. 45 tablet 3   mupirocin  ointment (BACTROBAN ) 2 % Apply 1 Application topically 2 (two) times daily. 30 g 2   Suvorexant (BELSOMRA) 20 MG TABS Take 20 mg by mouth Nightly. Take one tablet at bedtime daily     No facility-administered medications prior to visit.     PAST MEDICAL HISTORY: Past Medical History:  Diagnosis Date   Anxiety    Arthritis    Atrophic vaginitis    Atypical chest pain    thought to be GERD; myoview 05/17/07-no significant ischemia; echo 07/20/11- normal EF=>55%   Baker's cyst 03/28/2012   venous doppler 03/28/12=no thrombus; baker's cyst at left popliteal fossa   Chest pain    CIN I (cervical intraepithelial neoplasia I) 1990   Degenerative disc disease, cervical 2020   severe, C3-4, 4-5, 5-6. Currently receiving injections by Dr. Mona Angle of preferred pain management   Depression    Fibroid    Fibromyalgia    Hypertension    Lupus nephritis (HCC)    Migraines    Neuropathy    Osteopenia 09/2017   T score -2.0 improved from prior study   Premature menopause age 73   following chemotherapy for Lupus   Shingles      PAST SURGICAL HISTORY: Past Surgical History:  Procedure Laterality Date   cervical fossa injections  2020   CESAREAN SECTION     COLPOSCOPY     HYSTEROSCOPY     Myomectomy   MYOMECTOMY     Hysteroscopic myomectomy   NASAL SINUS SURGERY  01/2016   thumb surg  07/07/2019   TUBAL LIGATION     WRIST SURGERY Right 06/21/2020     FAMILY HISTORY: Family History  Problem Relation Age of Onset   Hypertension Mother    Asthma Mother    COPD Mother    Diabetes Father    Hypertension Father    Heart disease Father    Cancer Father         prostate     SOCIAL HISTORY: Social History   Socioeconomic History   Marital status: Married    Spouse name: Charlann Confer   Number of children: 2   Years of education: Automotive engineer   Highest education level: Not on file  Occupational History    Employer: DISABLED    Comment: disability  Tobacco Use   Smoking status: Never   Smokeless tobacco: Never  Vaping Use   Vaping status: Never Used  Substance and Sexual Activity   Alcohol  use: No    Alcohol /week: 0.0 standard drinks of alcohol    Drug use: No   Sexual activity: Not Currently    Birth control/protection: Surgical, Post-menopausal    Comment: BTL, 1st intercourse 58 yo-Fewer than 5 partners  Other  Topics Concern   Not on file  Social History Narrative   Patient lives at home with her daughter. She is currently separated (not legally).   Caffeine Use: 1 cup of coffee daily.    Right-handed   Social Drivers of Corporate investment banker Strain: Not on file  Food Insecurity: Not on file  Transportation Needs: Not on file  Physical Activity: Not on file  Stress: Not on file  Social Connections: Not on file  Intimate Partner Violence: Not on file      PHYSICAL EXAM  Vitals:   08/27/23 1050  BP: 130/80  Pulse: 66  SpO2: 97%  Weight: 226 lb 12.8 oz (102.9 kg)  Height: 5\' 5"  (1.651 m)     Body mass index is 37.74 kg/m.   Generalized: Well developed, in no acute distress  Cardiology: brief episode of irregular beats followed by regular rate and rhythm  Neurological examination  Mentation: Alert oriented to time, place, history taking. Follows all commands speech and language fluent Cranial nerve II-XII: Pupils were equal round reactive to light. Extraocular movements were full, visual field were full  Motor: The motor testing reveals 5 over 5 strength of all 4 extremities. Good symmetric motor tone is noted throughout.  Gait and station: Gait is normal.      DIAGNOSTIC DATA (LABS, IMAGING, TESTING) - I  reviewed patient records, labs, notes, testing and imaging myself where available.  Lab Results  Component Value Date   WBC 5.4 12/17/2020   HGB 13.5 12/17/2020   HCT 40.2 12/17/2020   MCV 86 12/17/2020   PLT 242 12/17/2020      Component Value Date/Time   NA 147 (H) 02/26/2023 1008   NA 142 07/01/2013 1104   K 3.9 02/26/2023 1008   K 4.2 07/01/2013 1104   CL 104 02/26/2023 1008   CO2 24 02/26/2023 1008   CO2 26 07/01/2013 1104   GLUCOSE 92 02/26/2023 1008   GLUCOSE 61 (L) 09/10/2015 0833   GLUCOSE 94 07/01/2013 1104   BUN 20 02/26/2023 1008   BUN 12.7 07/01/2013 1104   CREATININE 0.84 02/26/2023 1008   CREATININE 1.00 11/23/2021 1049   CREATININE 0.9 07/01/2013 1104   CALCIUM  9.7 02/26/2023 1008   CALCIUM  9.5 07/01/2013 1104   PROT 6.4 06/15/2023 1020   PROT 6.7 07/01/2013 1104   ALBUMIN 4.2 06/15/2023 1020   ALBUMIN 3.9 07/01/2013 1104   AST 23 06/15/2023 1020   AST 47 (H) 07/01/2013 1104   ALT 18 06/15/2023 1020   ALT 36 07/01/2013 1104   ALKPHOS 110 06/15/2023 1020   ALKPHOS 80 07/01/2013 1104   BILITOT 0.2 06/15/2023 1020   BILITOT 0.48 07/01/2013 1104   GFRNONAA 83 05/18/2020 1024   GFRAA 96 05/18/2020 1024   Lab Results  Component Value Date   CHOL 149 03/26/2023   HDL 66 03/26/2023   LDLCALC 71 03/26/2023   TRIG 58 03/26/2023   CHOLHDL 2.3 03/26/2023   Lab Results  Component Value Date   HGBA1C 5.4 09/10/2018   Lab Results  Component Value Date   VITAMINB12 >2000 (H) 09/10/2018   Lab Results  Component Value Date   TSH 0.939 02/26/2023      ASSESSMENT AND PLAN  58 y.o. year old female  has a past medical history of Anxiety, Arthritis, Atrophic vaginitis, Atypical chest pain, Baker's cyst (03/28/2012), Chest pain, CIN I (cervical intraepithelial neoplasia I) (1990), Degenerative disc disease, cervical (2020), Depression, Fibroid, Fibromyalgia, Hypertension, Lupus nephritis (HCC),  Migraines, Neuropathy, Osteopenia (09/2017), Premature  menopause (age 44), and Shingles. here with   Migraine without aura and without status migrainosus, not intractable   Laine reports improvement in headaches on Emgality  and Ubrelvy . We will continue Emgality  every 30 days and Ubrelvy  as needed. May combine Ubrelvy  and naratriptan  with Tylenol , Benadryl and Nsaid for intractable migraines. Could consider adding CoQ10 and B2 if approved by care team. Discussed Botox in the future. She will continue close follow up with PCP and specialist. She will monitor for symptoms of bradycardia and or irregular heart rhythm. Being followed for PVCs through cardiology. Reviewed heart monitor results. Asymptomatic, today. Healthy lifestyle habits encouraged. She will follow up with me in 1 year.    I personally spent a total of 30 minutes in the care of the patient today including preparing to see the patient, getting/reviewing separately obtained history, performing a medically appropriate exam/evaluation, counseling and educating, placing orders, documenting clinical information in the EHR, independently interpreting results, and communicating results.    Terrilyn Fick, MSN, FNP-C 08/27/2023, 11:00 AM  [facility] [address] [place], [place] [address] [phone]

## 2023-08-27 ENCOUNTER — Encounter: Payer: Self-pay | Admitting: Family Medicine

## 2023-08-27 ENCOUNTER — Ambulatory Visit (INDEPENDENT_AMBULATORY_CARE_PROVIDER_SITE_OTHER): Payer: Medicare Other | Admitting: Family Medicine

## 2023-08-27 ENCOUNTER — Other Ambulatory Visit: Payer: Self-pay | Admitting: Cardiovascular Disease

## 2023-08-27 VITALS — BP 130/80 | HR 66 | Ht 65.0 in | Wt 226.8 lb

## 2023-08-27 DIAGNOSIS — G43009 Migraine without aura, not intractable, without status migrainosus: Secondary | ICD-10-CM

## 2023-08-27 MED ORDER — UBRELVY 100 MG PO TABS: 100.0000 mg | ORAL_TABLET | Freq: Every day | ORAL | 11 refills | Status: DC | PRN

## 2023-08-27 MED ORDER — EMGALITY 120 MG/ML ~~LOC~~ SOAJ: 120.0000 mg | SUBCUTANEOUS | 3 refills | Status: AC

## 2023-08-27 NOTE — Patient Instructions (Signed)
 Below is our plan:  We will continue Emgality  injection every 3 days and Ubrelvy  as needed. Add CoQ10 and B2 pending approval with your care team.   Please make sure you are staying well hydrated. I recommend 50-60 ounces daily. Well balanced diet and regular exercise encouraged. Consistent sleep schedule with 6-8 hours recommended.   Please continue follow up with care team as directed.   Follow up with me in 1 year   You may receive a survey regarding today's visit. I encourage you to leave honest feed back as I do use this information to improve patient care. Thank you for seeing me today!   GENERAL HEADACHE INFORMATION:   Natural supplements: Magnesium  Oxide or Magnesium  Glycinate 500 mg at bed (up to 800 mg daily) Coenzyme Q10 300 mg in AM Vitamin B2- 200 mg twice a day   Add 1 supplement at a time since even natural supplements can have undesirable side effects. You can sometimes buy supplements cheaper (especially Coenzyme Q10) at www.WebmailGuide.co.za or at [facility].  Migraine with aura: There is increased risk for stroke in women with migraine with aura and a contraindication for the combined contraceptive pill for use by women who have migraine with aura. The risk for women with migraine without aura is lower. However other risk factors like smoking are far more likely to increase stroke risk than migraine. There is a recommendation for no smoking and for the use of OCPs without estrogen such as progestogen only pills particularly for women with migraine with aura.Aaron Aas People who have migraine headaches with auras may be 3 times more likely to have a stroke caused by a blood clot, compared to migraine patients who don't see auras. Women who take hormone-replacement therapy may be 30 percent more likely to suffer a clot-based stroke than women not taking medication containing estrogen. Other risk factors like smoking and high blood pressure may be  much more important.    Vitamins and herbs that  show potential:   Magnesium : Magnesium  (250 mg twice a day or 500 mg at bed) has a relaxant effect on smooth muscles such as blood vessels. Individuals suffering from frequent or daily headache usually have low magnesium  levels which can be increase with daily supplementation of 400-750 mg. Three trials found 40-90% average headache reduction  when used as a preventative. Magnesium  may help with headaches are aura, the best evidence for magnesium  is for migraine with aura is its thought to stop the cortical spreading depression we believe is the pathophysiology of migraine aura.Magnesium  also demonstrated the benefit in menstrually related migraine.  Magnesium  is part of the messenger system in the serotonin cascade and it is a good muscle relaxant.  It is also useful for constipation which can be a side effect of other medications used to treat migraine. Good sources include nuts, whole grains, and tomatoes. Side Effects: loose stool/diarrhea  Riboflavin (vitamin B 2) 200 mg twice a day. This vitamin assists nerve cells in the production of ATP a principal energy storing molecule.  It is necessary for many chemical reactions in the body.  There have been at least 3 clinical trials of riboflavin using 400 mg per day all of which suggested that migraine frequency can be decreased.  All 3 trials showed significant improvement in over half of migraine sufferers.  The supplement is found in bread, cereal, milk, meat, and poultry.  Most Americans get more riboflavin than the recommended daily allowance, however riboflavin deficiency is not necessary for the  supplements to help prevent headache. Side effects: energizing, green urine   Coenzyme Q10: This is present in almost all cells in the body and is critical component for the conversion of energy.  Recent studies have shown that a nutritional supplement of CoQ10 can reduce the frequency of migraine attacks by improving the energy production of cells as with  riboflavin.  Doses of 150 mg twice a day have been shown to be effective.   Melatonin: Increasing evidence shows correlation between melatonin secretion and headache conditions.  Melatonin supplementation has decreased headache intensity and duration.  It is widely used as a sleep aid.  Sleep is natures way of dealing with migraine.  A dose of 3 mg is recommended to start for headaches including cluster headache. Higher doses up to 15 mg has been reviewed for use in Cluster headache and have been used. The rationale behind using melatonin for cluster is that many theories regarding the cause of Cluster headache center around the disruption of the normal circadian rhythm in the brain.  This helps restore the normal circadian rhythm.   HEADACHE DIET: Foods and beverages which may trigger migraine Note that only 20% of headache patients are food sensitive. You will know if you are food sensitive if you get a headache consistently 20 minutes to 2 hours after eating a certain food. Only cut out a food if it causes headaches, otherwise you might remove foods you enjoy! What matters most for diet is to eat a well balanced healthy diet full of vegetables and low fat protein, and to not miss meals.   Chocolate, other sweets ALL cheeses except cottage and cream cheese Dairy products, yogurt, sour cream, ice cream Liver Meat extracts (Bovril, Marmite, meat tenderizers) Meats or fish which have undergone aging, fermenting, pickling or smoking. These include: Hotdogs,salami,Lox,sausage, mortadellas,smoked salmon, pepperoni, Pickled herring Pods of broad bean (English beans, Chinese pea pods, [place] (fava) beans, lima and navy beans Ripe avocado, ripe banana Yeast extracts or active yeast preparations such as Brewer's or Fleishman's (commercial bakes goods are permitted) Tomato based foods, pizza (lasagna, etc.)   MSG (monosodium glutamate) is disguised as many things; look for these common  aliases: Monopotassium glutamate Autolysed yeast Hydrolysed protein Sodium caseinate "flavorings" "all natural preservatives" Nutrasweet   Avoid all other foods that convincingly provoke headaches.   Resources: The Dizzy Althia Jetty Your Headache Diet, migrainestrong.com  https://zamora-andrews.com/   Caffeine and Migraine For patients that have migraine, caffeine intake more than 3 days per week can lead to dependency and increased migraine frequency. I would recommend cutting back on your caffeine intake as best you can. The recommended amount of caffeine is 200-300 mg daily, although migraine patients may experience dependency at even lower doses. While you may notice an increase in headache temporarily, cutting back will be helpful for headaches in the long run. For more information on caffeine and migraine, visit: https://americanmigrainefoundation.org/resource-library/caffeine-and-migraine/   Headache Prevention Strategies:   1. Maintain a headache diary; learn to identify and avoid triggers.  - This can be a simple note where you log when you had a headache, associated symptoms, and medications used - There are several smartphone apps developed to help track migraines: Migraine Buddy, Migraine Monitor, Curelator N1-Headache App   Common triggers include: Emotional triggers: Emotional/Upset family or friends Emotional/Upset occupation Business reversal/success Anticipation anxiety Crisis-serious Post-crisis periodNew job/position   Physical triggers: Vacation Day Weekend Strenuous Exercise High Altitude Location New Move Menstrual Day Physical Illness Oversleep/Not enough sleep Weather changes Light:  Photophobia or light sesnitivity treatment involves a balance between desensitization and reduction in overly strong input. Use dark polarized glasses outside, but not inside. Avoid bright or fluorescent light, but do not dim  environment to the point that going into a normally lit room hurts. Consider FL-41 tint lenses, which reduce the most irritating wavelengths without blocking too much light.  These can be obtained at axonoptics.com or theraspecs.com Foods: see list above.   2. Limit use of acute treatments (over-the-counter medications, triptans, etc.) to no more than 2 days per week or 10 days per month to prevent medication overuse headache (rebound headache).     3. Follow a regular schedule (including weekends and holidays): Don't skip meals. Eat a balanced diet. 8 hours of sleep nightly. Minimize stress. Exercise 30 minutes per day. Being overweight is associated with a 5 times increased risk of chronic migraine. Keep well hydrated and drink 6-8 glasses of water per day.   4. Initiate non-pharmacologic measures at the earliest onset of your headache. Rest and quiet environment. Relax and reduce stress. Breathe2Relax is a free app that can instruct you on    some simple relaxtion and breathing techniques. Http://Dawnbuse.com is a    free website that provides teaching videos on relaxation.  Also, there are  many apps that   can be downloaded for "mindful" relaxation.  An app called YOGA NIDRA will help walk you through mindfulness. Another app called Calm can be downloaded to give you a structured mindfulness guide with daily reminders and skill development. Headspace for guided meditation Mindfulness Based Stress Reduction Online Course: www.palousemindfulness.com Cold compresses.   5. Don't wait!! Take the maximum allowable dosage of prescribed medication at the first sign of migraine.   6. Compliance:  Take prescribed medication regularly as directed and at the first sign of a migraine.   7. Communicate:  Call your physician when problems arise, especially if your headaches change, increase in frequency/severity, or become associated with neurological symptoms (weakness, numbness, slurred speech, etc.).  Proceed to emergency room if you experience new or worsening symptoms or symptoms do not resolve, if you have new neurologic symptoms or if headache is severe, or for any concerning symptom.   8. Headache/pain management therapies: Consider various complementary methods, including medication, behavioral therapy, psychological counselling, biofeedback, massage therapy, acupuncture, dry needling, and other modalities.  Such measures may reduce the need for medications. Counseling for pain management, where patients learn to function and ignore/minimize their pain, seems to work very well.   9. Recommend changing family's attention and focus away from patient's headaches. Instead, emphasize daily activities. If first question of day is 'How are your headaches/Do you have a headache today?', then patient will constantly think about headaches, thus making them worse. Goal is to re-direct attention away from headaches, toward daily activities and other distractions.   10. Helpful Websites: www.AmericanHeadacheSociety.org PatentHood.ch www.headaches.org TightMarket.nl www.achenet.org

## 2023-08-31 ENCOUNTER — Other Ambulatory Visit: Payer: Self-pay | Admitting: Pharmacist Clinician (PhC)/ Clinical Pharmacy Specialist

## 2023-08-31 MED ORDER — WEGOVY 0.25 MG/0.5ML ~~LOC~~ SOAJ: 0.2500 mg | SUBCUTANEOUS | 0 refills | Status: DC

## 2023-08-31 NOTE — Addendum Note (Signed)
 Addended by: Eastin Swing L on: 08/31/2023 02:47 PM   Modules accepted: Orders

## 2023-09-01 ENCOUNTER — Other Ambulatory Visit: Payer: Self-pay | Admitting: General Practice

## 2023-09-03 ENCOUNTER — Other Ambulatory Visit: Payer: Self-pay

## 2023-09-03 ENCOUNTER — Encounter: Payer: Self-pay | Admitting: Pharmacist Clinician (PhC)/ Clinical Pharmacy Specialist

## 2023-09-03 MED ORDER — TORSEMIDE 20 MG PO TABS: ORAL_TABLET | ORAL | 2 refills | Status: AC

## 2023-09-04 ENCOUNTER — Telehealth: Payer: Self-pay | Admitting: Pharmacy Technician

## 2023-09-04 NOTE — Telephone Encounter (Signed)
 Received cmm notification to do prior auth on wegovy  0.25mg  but this was denied per 08/21/23 encounter. Per patient message from 09/03/2023 the patient picked up wegovy . I called her pharmacy [facility] to ask about it and they said they had to use the override code saying she is a cash paying patient since her insurance does not cover the wegovy  and then it went through on her coupon. If happens again in the future-her ins wont cover, get [facility] to run on the coupon only and put in the override and it will go for 199

## 2023-09-20 ENCOUNTER — Encounter: Payer: Self-pay | Admitting: Pharmacist Clinician (PhC)/ Clinical Pharmacy Specialist

## 2023-09-21 MED ORDER — WEGOVY 0.5 MG/0.5ML ~~LOC~~ SOAJ: 0.5000 mg | SUBCUTANEOUS | 0 refills | Status: DC

## 2023-09-24 ENCOUNTER — Telehealth: Payer: Self-pay | Admitting: Pharmacy Technician

## 2023-09-24 ENCOUNTER — Other Ambulatory Visit: Payer: Self-pay | Admitting: Cardiovascular Disease

## 2023-09-24 NOTE — Telephone Encounter (Signed)
 Received another cmm like we did for the wegovy  0.25mg , this time it was for the 0.5mg . patient gets ran just on the coupon since her insurance won't cover. I tried to call [facility] but they are closed until 10am. Sent the patient a mychart to get [facility] to run just on the coupon and if they have any problems to call me.

## 2023-10-17 ENCOUNTER — Ambulatory Visit (INDEPENDENT_AMBULATORY_CARE_PROVIDER_SITE_OTHER): Admitting: Podiatry

## 2023-10-17 DIAGNOSIS — Z79899 Other long term (current) drug therapy: Secondary | ICD-10-CM | POA: Diagnosis not present

## 2023-10-17 DIAGNOSIS — B351 Tinea unguium: Secondary | ICD-10-CM

## 2023-10-17 NOTE — Progress Notes (Signed)
 Subjective:  Patient ID: Marie Peterson, female    DOB: 04/27/1965,  MRN: [id number]  Chief Complaint  Patient presents with   Nail Problem    Nail fungus follow up     58 y.o. female presents with the above complaint.  Patient with complaint of left hallux thickened dystrophic mycotic nail x 1.  She states is doing better is improving the base is clearing up she had no issues taking Lamisil  denies any liver issues   Review of Systems: Negative except as noted in the HPI. Denies N/V/F/Ch.  Past Medical History:  Diagnosis Date   Anxiety    Arthritis    Atrophic vaginitis    Atypical chest pain    thought to be GERD; myoview 05/17/07-no significant ischemia; echo 07/20/11- normal EF=>55%   Baker's cyst 03/28/2012   venous doppler 03/28/12=no thrombus; baker's cyst at left popliteal fossa   Chest pain    CIN I (cervical intraepithelial neoplasia I) 1990   Degenerative disc disease, cervical 2020   severe, C3-4, 4-5, 5-6. Currently receiving injections by Dr. Ernesto of preferred pain management   Depression    Fibroid    Fibromyalgia    Hypertension    Lupus nephritis (HCC)    Migraines    Neuropathy    Osteopenia 09/2017   T score -2.0 improved from prior study   Premature menopause age 41   following chemotherapy for Lupus   Shingles     Current Outpatient Medications:    aspirin 81 MG tablet, Take 81 mg by mouth daily., Disp: , Rfl:    atorvastatin  (LIPITOR) 40 MG tablet, TAKE 1 TABLET(40 MG) BY MOUTH DAILY, Disp: 90 tablet, Rfl: 3   BENLYSTA 200 MG/ML SOSY, Inject 4 mLs into the skin once a week., Disp: , Rfl:    Cholecalciferol (VITAMIN D  PO), Take 2,000 Int'l Units by mouth daily. , Disp: , Rfl:    denosumab  (PROLIA ) 60 MG/ML SOSY injection, Inject 60 mg into the skin every 6 (six) months., Disp: , Rfl:    diclofenac sodium (VOLTAREN) 1 % GEL, Apply topically., Disp: , Rfl:    FERREX 150 150 MG capsule, Take 1 tablet by mouth daily., Disp: , Rfl: 1   folic acid   (FOLVITE ) 400 MCG tablet, Take 400 mcg by mouth daily., Disp: , Rfl:    Galcanezumab -gnlm (EMGALITY ) 120 MG/ML SOAJ, Inject 120 mg into the skin every 30 (thirty) days., Disp: 3 mL, Rfl: 3   hydrALAZINE  (APRESOLINE ) 25 MG tablet, Take 2 tablets by mouth each morning, then 1 tablet midday and 1 tablet at night, Disp: 120 tablet, Rfl: 6   HYDROcodone -acetaminophen  (NORCO) 7.5-325 MG tablet, Take 1 tablet by mouth 4 (four) times daily as needed., Disp: , Rfl:    hydroxychloroquine (PLAQUENIL) 200 MG tablet, Take 200 mg by mouth 2 (two) times daily. , Disp: , Rfl: 0   L-Methylfolate-B6-B12 (METANX PO), Take 1 tablet by mouth 2 (two) times daily. , Disp: , Rfl:    LINZESS 145 MCG CAPS capsule, Take 145 mcg by mouth every morning., Disp: , Rfl:    loratadine (CLARITIN) 10 MG tablet, Take 10 mg by mouth daily., Disp: , Rfl:    losartan  (COZAAR ) 100 MG tablet, Take 100 mg by mouth daily., Disp: , Rfl:    magnesium  oxide (MAG-OX) 400 (240 Mg) MG tablet, TAKE 1 TABLET(400 MG) BY MOUTH DAILY, Disp: 90 tablet, Rfl: 3   Multiple Vitamin (MULTIVITAMIN) tablet, Take 1 tablet by mouth daily., Disp: ,  Rfl:    naratriptan  (AMERGE) 2.5 MG tablet, Take 1 tablet (2.5 mg total) by mouth as needed for migraine. Take one (1) tablet at onset of headache; if returns or does not resolve, may repeat after 4 hours; do not exceed five (5) mg in 24 hours., Disp: 10 tablet, Rfl: 11   NYSTATIN  powder, APPLY 1 APPLICATION TOPICALLY TWICE DAILY, Disp: 15 g, Rfl: 1   Omega-3 Fatty Acids (FISH OIL) 1000 MG CAPS, Take 2 capsules by mouth daily., Disp: , Rfl:    omeprazole  (PRILOSEC) 40 MG capsule, Take 1 capsule (40 mg total) by mouth in the morning and at bedtime., Disp: 60 capsule, Rfl: 5   ondansetron  (ZOFRAN -ODT) 4 MG disintegrating tablet, Take 1 tablet (4 mg total) by mouth every 8 (eight) hours as needed for nausea or vomiting., Disp: 20 tablet, Rfl: 11   potassium chloride  SA (KLOR-CON  M) 20 MEQ tablet, TAKE 1 TABLET(20 MEQ) BY  MOUTH TWICE DAILY, Disp: 180 tablet, Rfl: 3   predniSONE (DELTASONE) 20 MG tablet, Take 20 mg by mouth daily with breakfast., Disp: , Rfl:    pregabalin  (LYRICA ) 200 MG capsule, Take 200 mg by mouth daily., Disp: , Rfl:    PREVIDENT 5000 DRY MOUTH 1.1 % GEL dental gel, Place onto teeth as directed., Disp: , Rfl:    Semaglutide -Weight Management (WEGOVY ) 0.25 MG/0.5ML SOAJ, Inject 0.25 mg into the skin once a week. FOR 4 WEEKS, Disp: 2 mL, Rfl: 0   Semaglutide -Weight Management (WEGOVY ) 0.5 MG/0.5ML SOAJ, Inject 0.5 mg into the skin once a week. FOR 4 WEEKS WHEN YOU FINISH THE 0.25 MG, Disp: 2 mL, Rfl: 0   tiZANidine (ZANAFLEX) 4 MG tablet, 1 tablet Orally Up to TID for 30 days, Disp: , Rfl:    tolterodine (DETROL LA) 4 MG 24 hr capsule, Take 4 mg by mouth daily., Disp: , Rfl:    torsemide  (DEMADEX ) 20 MG tablet, TAKE 2 TABLETS(40 MG) BY MOUTH TWICE DAILY, Disp: 360 tablet, Rfl: 2   traZODone (DESYREL) 150 MG tablet, Take 150 mg by mouth at bedtime., Disp: , Rfl:    Ubrogepant  (UBRELVY ) 100 MG TABS, Take 1 tablet (100 mg total) by mouth daily as needed. Take one tablet at onset of headache, may repeat 1 tablet in 2 hours, no more than 2 tablets in 24 hours, Disp: 8 tablet, Rfl: 11   XIIDRA 5 % SOLN, Place 1 drop into both eyes 2 (two) times daily., Disp: , Rfl:   Social History   Tobacco Use  Smoking Status Never  Smokeless Tobacco Never    Allergies  Allergen Reactions   Ubrogepant  Swelling    Lip swelling    Benazepril      Lip swelling   Cymbalta [Duloxetine Hcl]     Lip swelling   Hygroton  [Chlorthalidone ] Swelling    Swelling in lips   Mirabegron Itching    Other reaction(s): rash   Other     Other reaction(s): Unknown   Duloxetine Rash    Lip swelling   Objective:  There were no vitals filed for this visit. There is no height or weight on file to calculate BMI. Constitutional Well developed. Well nourished.  Vascular Dorsalis pedis pulses palpable bilaterally. Posterior  tibial pulses palpable bilaterally. Capillary refill normal to all digits.  No cyanosis or clubbing noted. Pedal hair growth normal.  Neurologic Normal speech. Oriented to person, place, and time. Epicritic sensation to light touch grossly present bilaterally.  Dermatologic Nails left thickened and onychodystrophy mycotic  nail x 1 left hallux improving Skin within normal limits  Orthopedic: Normal joint ROM without pain or crepitus bilaterally. No visible deformities. No bony tenderness.   Radiographs: None Assessment:   1. Long-term use of high-risk medication   2. Nail fungus   3. Onychomycosis due to dermatophyte     Plan:  Patient was evaluated and treated and all questions answered.  Left hallux onychomycosis~second round  -Educated the patient on the etiology of onychomycosis and various treatment options associated with improving the fungal load.  I explained to the patient that there is 3 treatment options available to treat the onychomycosis including topical, p.o., laser treatment.  Patient elected to undergo p.o. options with Lamisil /terbinafine  therapy.  In order for me to start the medication therapy, I explained to the patient the importance of evaluating the liver and obtaining the liver function test.  Once the liver function test comes back normal I will start him on 4-month course of Lamisil  therapy.  Patient understood all risk and would like to proceed with Lamisil  therapy.  I have asked the patient to immediately stop the Lamisil  therapy if she has any reactions to it and call the office or go to the emergency room right away.  Patient states understanding   No follow-ups on file.

## 2023-10-18 ENCOUNTER — Telehealth: Payer: Self-pay | Admitting: *Deleted

## 2023-10-18 DIAGNOSIS — M810 Age-related osteoporosis without current pathological fracture: Secondary | ICD-10-CM

## 2023-10-18 DIAGNOSIS — Z780 Asymptomatic menopausal state: Secondary | ICD-10-CM

## 2023-10-18 LAB — HEPATIC FUNCTION PANEL
ALT: 25 IU/L (ref 0–32)
AST: 41 IU/L — ABNORMAL HIGH (ref 0–40)
Albumin: 4 g/dL (ref 3.8–4.9)
Alkaline Phosphatase: 77 IU/L (ref 44–121)
Bilirubin Total: 0.3 mg/dL (ref 0.0–1.2)
Bilirubin, Direct: 0.13 mg/dL (ref 0.00–0.40)
Total Protein: 6.2 g/dL (ref 6.0–8.5)

## 2023-10-18 NOTE — Telephone Encounter (Signed)
 Patient states she received a letter to schedule next Prolia .   Per review of EPIC, last Prolia  received 06/14/23  Per review of EPIC, patient has BMD recall for 10/2023.   Call placed to patient, left message advising as seen above. Return call to office to advise where you would like the order to be sent.

## 2023-10-19 NOTE — Telephone Encounter (Signed)
 Spoke with patient.   AEX scheduled for 02/26/24 at 0800 with TW  Reviewed locations for scheduling BMD, order placed for [facility], number provided for scheduling. Order placed.   Routing to provider for final review. Patient is agreeable to disposition. Will close encounter.

## 2023-10-31 ENCOUNTER — Ambulatory Visit
Attending: Pharmacist Clinician (PhC)/ Clinical Pharmacy Specialist | Admitting: Pharmacist Clinician (PhC)/ Clinical Pharmacy Specialist

## 2023-10-31 ENCOUNTER — Encounter: Payer: Self-pay | Admitting: Cardiovascular Disease

## 2023-10-31 NOTE — Progress Notes (Deleted)
 Office Visit    Patient Name: Marie Peterson Date of Encounter: 10/31/2023  Primary Care Provider:  Corlis Pagan, NP Primary Cardiologist:  Dorn Lesches, MD  Chief Complaint    Weight management, hypertension  Significant Past Medical History   CAD Non-obstructive on CTA  bradycardia HR runs 40-60 range  CHF HFpEF  palpitations Zio showed frequent PAC's with short bursts of SVT, not overly bothersome to patient; on metoprolol  succ 12.5  HLD 12/24 LDL 71on atorvastatin  40  SLE W/lupus nephritis    Allergies  Allergen Reactions   Ubrogepant  Swelling    Lip swelling    Benazepril      Lip swelling   Cymbalta [Duloxetine Hcl]     Lip swelling   Hygroton  [Chlorthalidone ] Swelling    Swelling in lips   Mirabegron Itching    Other reaction(s): rash   Other     Other reaction(s): Unknown   Duloxetine Rash    Lip swelling    History of Present Illness    Marie Peterson is a 58 y.o. female patient of Dr Lesches, in the office today to follow up on hypertension and weight management.  I most recently saw her in May, at which time her pressure was good at 112/82, and she was noting more SLE flares with the hydralazine .  We cut the dose back to 50 mg in the mornings and 25 mg afternoon and evening.  Since then she reported some low home readings and we further decreased it to 25 mg tid.  She is also on Wegovy  for weight management, having just taken the last dose of 0.5 mg **  Today she is in the office for follow up.    Current meds that may affect weight: prednisone  Baseline weight/BMI: 104.2 kg // 38.31  Current weight medication:  Wegovy  0.5 mg  Current BP medications:  losartan  100 mg daily, hydralazine  25 mg tid  Diet:   mostly home cooked, rarely eat out; occasional salt with cooking but not at the table, vegetables mix of ffc (rarely canned); protein mostly chicken and fish   Exercise:  no, walks when able (joint problems, swelling in legs, plantar  fascitis)  Family History: mother had hypertension, COPD, asthma, still living, father hypertension, died non heart related; siblings healthy overall, 2 daughters healthy   Social History:   Tobacco: no             Alcohol : no             Caffeine: 1 coffee in the mornings home brewed  Accessory Clinical Findings    Lab Results  Component Value Date   CREATININE 0.84 02/26/2023   BUN 20 02/26/2023   NA 147 (H) 02/26/2023   K 3.9 02/26/2023   CL 104 02/26/2023   CO2 24 02/26/2023   Lab Results  Component Value Date   ALT 25 10/17/2023   AST 41 (H) 10/17/2023   ALKPHOS 77 10/17/2023   BILITOT 0.3 10/17/2023   Lab Results  Component Value Date   HGBA1C 5.4 09/10/2018    Home Medications/Allergies    Current Outpatient Medications  Medication Sig Dispense Refill   aspirin 81 MG tablet Take 81 mg by mouth daily.     atorvastatin  (LIPITOR) 40 MG tablet TAKE 1 TABLET(40 MG) BY MOUTH DAILY 90 tablet 3   BENLYSTA 200 MG/ML SOSY Inject 4 mLs into the skin once a week.     Cholecalciferol (VITAMIN D  PO) Take 2,000  Int'l Units by mouth daily.      denosumab  (PROLIA ) 60 MG/ML SOSY injection Inject 60 mg into the skin every 6 (six) months.     diclofenac sodium (VOLTAREN) 1 % GEL Apply topically.     FERREX 150 150 MG capsule Take 1 tablet by mouth daily.  1   folic acid  (FOLVITE ) 400 MCG tablet Take 400 mcg by mouth daily.     Galcanezumab -gnlm (EMGALITY ) 120 MG/ML SOAJ Inject 120 mg into the skin every 30 (thirty) days. 3 mL 3   hydrALAZINE  (APRESOLINE ) 25 MG tablet Take 2 tablets by mouth each morning, then 1 tablet midday and 1 tablet at night 120 tablet 6   HYDROcodone -acetaminophen  (NORCO) 7.5-325 MG tablet Take 1 tablet by mouth 4 (four) times daily as needed.     hydroxychloroquine (PLAQUENIL) 200 MG tablet Take 200 mg by mouth 2 (two) times daily.   0   L-Methylfolate-B6-B12 (METANX PO) Take 1 tablet by mouth 2 (two) times daily.      LINZESS 145 MCG CAPS capsule Take 145  mcg by mouth every morning.     loratadine (CLARITIN) 10 MG tablet Take 10 mg by mouth daily.     losartan  (COZAAR ) 100 MG tablet Take 100 mg by mouth daily.     magnesium  oxide (MAG-OX) 400 (240 Mg) MG tablet TAKE 1 TABLET(400 MG) BY MOUTH DAILY 90 tablet 3   Multiple Vitamin (MULTIVITAMIN) tablet Take 1 tablet by mouth daily.     naratriptan  (AMERGE) 2.5 MG tablet Take 1 tablet (2.5 mg total) by mouth as needed for migraine. Take one (1) tablet at onset of headache; if returns or does not resolve, may repeat after 4 hours; do not exceed five (5) mg in 24 hours. 10 tablet 11   NYSTATIN  powder APPLY 1 APPLICATION TOPICALLY TWICE DAILY 15 g 1   Omega-3 Fatty Acids (FISH OIL) 1000 MG CAPS Take 2 capsules by mouth daily.     omeprazole  (PRILOSEC) 40 MG capsule Take 1 capsule (40 mg total) by mouth in the morning and at bedtime. 60 capsule 5   ondansetron  (ZOFRAN -ODT) 4 MG disintegrating tablet Take 1 tablet (4 mg total) by mouth every 8 (eight) hours as needed for nausea or vomiting. 20 tablet 11   potassium chloride  SA (KLOR-CON  M) 20 MEQ tablet TAKE 1 TABLET(20 MEQ) BY MOUTH TWICE DAILY 180 tablet 3   predniSONE (DELTASONE) 20 MG tablet Take 20 mg by mouth daily with breakfast.     pregabalin  (LYRICA ) 200 MG capsule Take 200 mg by mouth daily.     PREVIDENT 5000 DRY MOUTH 1.1 % GEL dental gel Place onto teeth as directed.     Semaglutide -Weight Management (WEGOVY ) 0.25 MG/0.5ML SOAJ Inject 0.25 mg into the skin once a week. FOR 4 WEEKS 2 mL 0   Semaglutide -Weight Management (WEGOVY ) 0.5 MG/0.5ML SOAJ Inject 0.5 mg into the skin once a week. FOR 4 WEEKS WHEN YOU FINISH THE 0.25 MG 2 mL 0   tiZANidine (ZANAFLEX) 4 MG tablet 1 tablet Orally Up to TID for 30 days     tolterodine (DETROL LA) 4 MG 24 hr capsule Take 4 mg by mouth daily.     torsemide  (DEMADEX ) 20 MG tablet TAKE 2 TABLETS(40 MG) BY MOUTH TWICE DAILY 360 tablet 2   traZODone (DESYREL) 150 MG tablet Take 150 mg by mouth at bedtime.      Ubrogepant  (UBRELVY ) 100 MG TABS Take 1 tablet (100 mg total) by mouth daily as needed.  Take one tablet at onset of headache, may repeat 1 tablet in 2 hours, no more than 2 tablets in 24 hours 8 tablet 11   XIIDRA 5 % SOLN Place 1 drop into both eyes 2 (two) times daily.     No current facility-administered medications for this visit.     Allergies  Allergen Reactions   Ubrogepant  Swelling    Lip swelling    Benazepril      Lip swelling   Cymbalta [Duloxetine Hcl]     Lip swelling   Hygroton  [Chlorthalidone ] Swelling    Swelling in lips   Mirabegron Itching    Other reaction(s): rash   Other     Other reaction(s): Unknown   Duloxetine Rash    Lip swelling    Assessment & Plan    No problem-specific Assessment & Plan notes found for this encounter.   Tranae Laramie PharmD CPP [facility]  [address] [place], [place] [address] [phone]

## 2023-11-02 ENCOUNTER — Encounter: Payer: Self-pay | Admitting: Pharmacist Clinician (PhC)/ Clinical Pharmacy Specialist

## 2023-11-02 ENCOUNTER — Telehealth: Payer: Self-pay | Admitting: Pharmacist Clinician (PhC)/ Clinical Pharmacy Specialist

## 2023-11-02 NOTE — Telephone Encounter (Signed)
 Pt c/o medication issue:  1. Name of Medication:   Semaglutide -Weight Management (WEGOVY ) 0.5 MG/0.5ML SOAJ    2. How are you currently taking this medication (dosage and times per day)? Patient has taken her last injection  3. Are you having a reaction (difficulty breathing--STAT)? no  4. What is your medication issue? Patient had to cancel her appt with Kristin due to a fever and had to rescheduled for 09/15. She needs a refill on Wegovy  but not sure if she is supposed to stay on the .5ml or go back to the .25ml. Please advise.

## 2023-11-08 MED ORDER — WEGOVY 1 MG/0.5ML ~~LOC~~ SOAJ: 1.0000 mg | SUBCUTANEOUS | 0 refills | Status: DC

## 2023-11-08 NOTE — Telephone Encounter (Signed)
 Spoke to patient. Reports she tolerates 0.5 mg dose of Wegovy well. Lost so far 9 lbs. And ready to increase dose to 1 mg once a week. Prescription sent to the preferred pharmacy.

## 2023-11-09 ENCOUNTER — Other Ambulatory Visit (HOSPITAL_COMMUNITY): Payer: Self-pay

## 2023-11-21 ENCOUNTER — Other Ambulatory Visit: Payer: Self-pay | Admitting: *Deleted

## 2023-11-21 DIAGNOSIS — M810 Age-related osteoporosis without current pathological fracture: Secondary | ICD-10-CM

## 2023-11-21 MED ORDER — DENOSUMAB 60 MG/ML ~~LOC~~ SOSY: 60.0000 mg | PREFILLED_SYRINGE | SUBCUTANEOUS | Status: DC

## 2023-11-27 ENCOUNTER — Ambulatory Visit (HOSPITAL_BASED_OUTPATIENT_CLINIC_OR_DEPARTMENT_OTHER)
Admission: RE | Admit: 2023-11-27 | Discharge: 2023-11-27 | Disposition: A | Discharge status: Home / Self Care | Source: Ambulatory Visit | Attending: Nurse Practitioner | Admitting: Nurse Practitioner

## 2023-11-27 DIAGNOSIS — Z780 Asymptomatic menopausal state: Secondary | ICD-10-CM | POA: Insufficient documentation

## 2023-11-27 DIAGNOSIS — M810 Age-related osteoporosis without current pathological fracture: Secondary | ICD-10-CM | POA: Insufficient documentation

## 2023-11-29 ENCOUNTER — Ambulatory Visit: Payer: Self-pay | Admitting: Nurse Practitioner

## 2023-12-03 ENCOUNTER — Encounter: Payer: Self-pay | Admitting: Pharmacist Clinician (PhC)/ Clinical Pharmacy Specialist

## 2023-12-10 ENCOUNTER — Ambulatory Visit: Admitting: Pharmacist Clinician (PhC)/ Clinical Pharmacy Specialist

## 2023-12-10 NOTE — Progress Notes (Deleted)
 Office Visit    Patient Name: Marie Peterson Date of Encounter: 12/10/2023  Primary Care Provider:  Corlis Pagan, NP Primary Cardiologist:  Dorn Lesches, MD  Chief Complaint    Hypertension  Significant Past Medical History   CAD Non-obstructive on CTA  bradycardia HR runs 40-60 range  CHF HFpEF  palpitations Zio showed frequent PAC's with short bursts of SVT, not overly bothersome to patient; on metoprolol  succ 12.5  HLD 12/24 LDL 71on atorvastatin  40  SLE W/lupus nephritis    Allergies  Allergen Reactions   Ubrogepant  Swelling    Lip swelling    Benazepril      Lip swelling   Cymbalta [Duloxetine Hcl]     Lip swelling   Hygroton  [Chlorthalidone ] Swelling    Swelling in lips   Mirabegron Itching    Other reaction(s): rash   Other     Other reaction(s): Unknown   Duloxetine Rash    Lip swelling    History of Present Illness    Marie Peterson is a 58 y.o. female patient of Dr Lesches, in the office today for hypertension evaluation.   She has been on losartan , metoprolol  and hydralazine .  Because of her history of SVT/PAC's and palpitations, she was given metoprolol  succ 12.5 mg daily, but told it would need to be discontinued if her heart rate were to consistently stay in the 40's.   I have also seen her for weight management.  Today she returns for follow up.  She had sent BP readings in early August, and home readings were almost all WNL (3/24 were elevated systolic, highest 138; 7/24 elevated diastolic, highest 89).  She has dropped from starting weight of 226 lb to 209.  Blood Pressure Goal:  130/80  Current Medications:  losartan  100 mg every day, hydralazine  25 mg bid  Previously tried:  benazepril  - lip swelling, chlorthalidone  - lip swelling   Family Hx:  mother had hypertension, COPD, asthma, still living, father hypertension, died non heart related; siblings healthy overall, 2 daughters healthy   Social Hx:      Tobacco: no  Alcohol : no  Caffeine: 1  coffee in the mornings home brewed  Diet:   mostly home cooke, rarely eat out; occasional salt with cooking but not at the table, vegetables mix of ffc (rarely canned); protein mostly chicken and fish  Exercise:  no, walks when able (joint problems, swelling in legs, plantar fascitis)  Home BP readings:  newer device about 6 months old ([employer])  Last 12 readings average 151/87  HR 51; only 1 reading < 130 systolic, 2 readings < 80 diastolic  Adherence Assessment  Do you ever forget to take your medication? [] Yes [x] No  Do you ever skip doses due to side effects? [] Yes [x] No  Do you have trouble affording your medicines? [] Yes [x] No  Are you ever unable to pick up your medication due to transportation difficulties? [] Yes [x] No   Adherence strategy: 7 day pill minders  Accessory Clinical Findings    Lab Results  Component Value Date   CREATININE 0.84 02/26/2023   BUN 20 02/26/2023   NA 147 (H) 02/26/2023   K 3.9 02/26/2023   CL 104 02/26/2023   CO2 24 02/26/2023   Lab Results  Component Value Date   ALT 25 10/17/2023   AST 41 (H) 10/17/2023   ALKPHOS 77 10/17/2023   BILITOT 0.3 10/17/2023   Lab Results  Component Value Date   HGBA1C 5.4 09/10/2018    Home  Medications    Current Outpatient Medications  Medication Sig Dispense Refill   aspirin 81 MG tablet Take 81 mg by mouth daily.     atorvastatin  (LIPITOR) 40 MG tablet TAKE 1 TABLET(40 MG) BY MOUTH DAILY 90 tablet 3   BENLYSTA 200 MG/ML SOSY Inject 4 mLs into the skin once a week.     Cholecalciferol (VITAMIN D  PO) Take 2,000 Int'l Units by mouth daily.      denosumab  (PROLIA ) 60 MG/ML SOSY injection Inject 60 mg into the skin every 6 (six) months.     diclofenac sodium (VOLTAREN) 1 % GEL Apply topically.     FERREX 150 150 MG capsule Take 1 tablet by mouth daily.  1   folic acid  (FOLVITE ) 400 MCG tablet Take 400 mcg by mouth daily.     Galcanezumab -gnlm (EMGALITY ) 120 MG/ML SOAJ Inject 120 mg into the skin  every 30 (thirty) days. 3 mL 3   hydrALAZINE  (APRESOLINE ) 25 MG tablet Take 2 tablets by mouth each morning, then 1 tablet midday and 1 tablet at night 120 tablet 6   HYDROcodone -acetaminophen  (NORCO) 7.5-325 MG tablet Take 1 tablet by mouth 4 (four) times daily as needed.     hydroxychloroquine (PLAQUENIL) 200 MG tablet Take 200 mg by mouth 2 (two) times daily.   0   L-Methylfolate-B6-B12 (METANX PO) Take 1 tablet by mouth 2 (two) times daily.      LINZESS 145 MCG CAPS capsule Take 145 mcg by mouth every morning.     loratadine (CLARITIN) 10 MG tablet Take 10 mg by mouth daily.     losartan  (COZAAR ) 100 MG tablet Take 100 mg by mouth daily.     magnesium  oxide (MAG-OX) 400 (240 Mg) MG tablet TAKE 1 TABLET(400 MG) BY MOUTH DAILY 90 tablet 3   Multiple Vitamin (MULTIVITAMIN) tablet Take 1 tablet by mouth daily.     naratriptan  (AMERGE) 2.5 MG tablet Take 1 tablet (2.5 mg total) by mouth as needed for migraine. Take one (1) tablet at onset of headache; if returns or does not resolve, may repeat after 4 hours; do not exceed five (5) mg in 24 hours. 10 tablet 11   NYSTATIN  powder APPLY 1 APPLICATION TOPICALLY TWICE DAILY 15 g 1   Omega-3 Fatty Acids (FISH OIL) 1000 MG CAPS Take 2 capsules by mouth daily.     omeprazole  (PRILOSEC) 40 MG capsule Take 1 capsule (40 mg total) by mouth in the morning and at bedtime. 60 capsule 5   ondansetron  (ZOFRAN -ODT) 4 MG disintegrating tablet Take 1 tablet (4 mg total) by mouth every 8 (eight) hours as needed for nausea or vomiting. 20 tablet 11   potassium chloride  SA (KLOR-CON  M) 20 MEQ tablet TAKE 1 TABLET(20 MEQ) BY MOUTH TWICE DAILY 180 tablet 3   predniSONE (DELTASONE) 20 MG tablet Take 20 mg by mouth daily with breakfast.     pregabalin  (LYRICA ) 200 MG capsule Take 200 mg by mouth daily.     PREVIDENT 5000 DRY MOUTH 1.1 % GEL dental gel Place onto teeth as directed.     semaglutide -weight management (WEGOVY ) 1 MG/0.5ML SOAJ SQ injection Inject 1 mg into the  skin once a week. 2 mL 0   tiZANidine (ZANAFLEX) 4 MG tablet 1 tablet Orally Up to TID for 30 days     tolterodine (DETROL LA) 4 MG 24 hr capsule Take 4 mg by mouth daily.     torsemide  (DEMADEX ) 20 MG tablet TAKE 2 TABLETS(40 MG) BY MOUTH TWICE  DAILY 360 tablet 2   traZODone (DESYREL) 150 MG tablet Take 150 mg by mouth at bedtime.     Ubrogepant  (UBRELVY ) 100 MG TABS Take 1 tablet (100 mg total) by mouth daily as needed. Take one tablet at onset of headache, may repeat 1 tablet in 2 hours, no more than 2 tablets in 24 hours 8 tablet 11   XIIDRA 5 % SOLN Place 1 drop into both eyes 2 (two) times daily.     Current Facility-Administered Medications  Medication Dose Route Frequency Provider Last Rate Last Admin   [START ON 12/15/2023] denosumab  (PROLIA ) injection 60 mg  60 mg Subcutaneous Q6 months Prentiss Riggs A, NP           Assessment & Plan    No problem-specific Assessment & Plan notes found for this encounter.   Buel Molder PharmD CPP [facility]  [address] [place], [place] [address] [phone]

## 2023-12-11 ENCOUNTER — Encounter: Payer: Self-pay | Admitting: Pharmacist Clinician (PhC)/ Clinical Pharmacy Specialist

## 2023-12-17 ENCOUNTER — Telehealth: Admitting: *Deleted

## 2023-12-17 NOTE — Telephone Encounter (Signed)
 Call to patient. Patient advised that [facility] no longer covering Prolia  injections. Appointment cancelled for 12/18/23. Please advise on alternative medications.   Routing to provider for review.

## 2023-12-17 NOTE — Telephone Encounter (Signed)
 Please see if [employer] covers Jubbonti.

## 2023-12-18 ENCOUNTER — Ambulatory Visit

## 2024-01-07 ENCOUNTER — Encounter: Payer: Self-pay | Admitting: Pharmacist Clinician (PhC)/ Clinical Pharmacy Specialist

## 2024-01-16 ENCOUNTER — Ambulatory Visit (INDEPENDENT_AMBULATORY_CARE_PROVIDER_SITE_OTHER): Admitting: Podiatry

## 2024-01-16 DIAGNOSIS — B351 Tinea unguium: Secondary | ICD-10-CM | POA: Diagnosis not present

## 2024-01-16 MED ORDER — CICLOPIROX 8 % EX SOLN: Freq: Every day | CUTANEOUS | 0 refills | Status: DC

## 2024-01-16 NOTE — Progress Notes (Signed)
 Subjective:  Patient ID: Marie Peterson, female    DOB: [birth year],  MRN: [id number]  Chief Complaint  Patient presents with   Nail Problem    3 month check for nail fungus. Not Diabetic 81 mg Asprin    58 y.o. female presents with the above complaint.  Patient with complaint of left hallux thickened dystrophic mycotic nail x 1.  She states is doing better is improving the base is clearing up she had no issues taking Lamisil  denies any liver issues   Review of Systems: Negative except as noted in the HPI. Denies N/V/F/Ch.  Past Medical History:  Diagnosis Date   Anxiety    Arthritis    Atrophic vaginitis    Atypical chest pain    thought to be GERD; myoview 05/17/07-no significant ischemia; echo 07/20/11- normal EF=>55%   Baker's cyst 03/28/2012   venous doppler 03/28/12=no thrombus; baker's cyst at left popliteal fossa   Chest pain    CIN I (cervical intraepithelial neoplasia I) 1990   Degenerative disc disease, cervical 2020   severe, C3-4, 4-5, 5-6. Currently receiving injections by Dr. Ernesto of preferred pain management   Depression    Fibroid    Fibromyalgia    Hypertension    Lupus nephritis (HCC)    Migraines    Neuropathy    Osteopenia 09/2017   T score -2.0 improved from prior study   Premature menopause age 39   following chemotherapy for Lupus   Shingles     Current Outpatient Medications:    aspirin 81 MG tablet, Take 81 mg by mouth daily., Disp: , Rfl:    atorvastatin  (LIPITOR) 40 MG tablet, TAKE 1 TABLET(40 MG) BY MOUTH DAILY, Disp: 90 tablet, Rfl: 3   BENLYSTA 200 MG/ML SOSY, Inject 4 mLs into the skin once a week., Disp: , Rfl:    Cholecalciferol (VITAMIN D  PO), Take 2,000 Int'l Units by mouth daily. , Disp: , Rfl:    ciclopirox (PENLAC) 8 % solution, Apply topically at bedtime. Apply over nail and surrounding skin. Apply daily over previous coat. After seven (7) days, may remove with alcohol  and continue cycle., Disp: 6.6 mL, Rfl: 0   denosumab  (PROLIA ) 60  MG/ML SOSY injection, Inject 60 mg into the skin every 6 (six) months., Disp: , Rfl:    diclofenac sodium (VOLTAREN) 1 % GEL, Apply topically., Disp: , Rfl:    FERREX 150 150 MG capsule, Take 1 tablet by mouth daily., Disp: , Rfl: 1   folic acid  (FOLVITE ) 400 MCG tablet, Take 400 mcg by mouth daily., Disp: , Rfl:    Galcanezumab -gnlm (EMGALITY ) 120 MG/ML SOAJ, Inject 120 mg into the skin every 30 (thirty) days., Disp: 3 mL, Rfl: 3   hydrALAZINE  (APRESOLINE ) 25 MG tablet, Take 2 tablets by mouth each morning, then 1 tablet midday and 1 tablet at night, Disp: 120 tablet, Rfl: 6   HYDROcodone -acetaminophen  (NORCO) 7.5-325 MG tablet, Take 1 tablet by mouth 4 (four) times daily as needed., Disp: , Rfl:    hydroxychloroquine (PLAQUENIL) 200 MG tablet, Take 200 mg by mouth 2 (two) times daily. , Disp: , Rfl: 0   L-Methylfolate-B6-B12 (METANX PO), Take 1 tablet by mouth 2 (two) times daily. , Disp: , Rfl:    LINZESS 145 MCG CAPS capsule, Take 145 mcg by mouth every morning., Disp: , Rfl:    loratadine (CLARITIN) 10 MG tablet, Take 10 mg by mouth daily., Disp: , Rfl:    losartan  (COZAAR ) 100 MG tablet, Take 100  mg by mouth daily., Disp: , Rfl:    magnesium  oxide (MAG-OX) 400 (240 Mg) MG tablet, TAKE 1 TABLET(400 MG) BY MOUTH DAILY, Disp: 90 tablet, Rfl: 3   Multiple Vitamin (MULTIVITAMIN) tablet, Take 1 tablet by mouth daily., Disp: , Rfl:    naratriptan  (AMERGE) 2.5 MG tablet, Take 1 tablet (2.5 mg total) by mouth as needed for migraine. Take one (1) tablet at onset of headache; if returns or does not resolve, may repeat after 4 hours; do not exceed five (5) mg in 24 hours., Disp: 10 tablet, Rfl: 11   NYSTATIN  powder, APPLY 1 APPLICATION TOPICALLY TWICE DAILY, Disp: 15 g, Rfl: 1   Omega-3 Fatty Acids (FISH OIL) 1000 MG CAPS, Take 2 capsules by mouth daily., Disp: , Rfl:    omeprazole  (PRILOSEC) 40 MG capsule, Take 1 capsule (40 mg total) by mouth in the morning and at bedtime., Disp: 60 capsule, Rfl: 5    ondansetron  (ZOFRAN -ODT) 4 MG disintegrating tablet, Take 1 tablet (4 mg total) by mouth every 8 (eight) hours as needed for nausea or vomiting., Disp: 20 tablet, Rfl: 11   potassium chloride  SA (KLOR-CON  M) 20 MEQ tablet, TAKE 1 TABLET(20 MEQ) BY MOUTH TWICE DAILY, Disp: 180 tablet, Rfl: 3   predniSONE (DELTASONE) 20 MG tablet, Take 20 mg by mouth daily with breakfast., Disp: , Rfl:    pregabalin  (LYRICA ) 200 MG capsule, Take 200 mg by mouth daily., Disp: , Rfl:    PREVIDENT 5000 DRY MOUTH 1.1 % GEL dental gel, Place onto teeth as directed., Disp: , Rfl:    semaglutide -weight management (WEGOVY ) 1 MG/0.5ML SOAJ SQ injection, Inject 1 mg into the skin once a week., Disp: 2 mL, Rfl: 0   tiZANidine (ZANAFLEX) 4 MG tablet, 1 tablet Orally Up to TID for 30 days, Disp: , Rfl:    tolterodine (DETROL LA) 4 MG 24 hr capsule, Take 4 mg by mouth daily., Disp: , Rfl:    torsemide  (DEMADEX ) 20 MG tablet, TAKE 2 TABLETS(40 MG) BY MOUTH TWICE DAILY, Disp: 360 tablet, Rfl: 2   traZODone (DESYREL) 150 MG tablet, Take 150 mg by mouth at bedtime., Disp: , Rfl:    Ubrogepant  (UBRELVY ) 100 MG TABS, Take 1 tablet (100 mg total) by mouth daily as needed. Take one tablet at onset of headache, may repeat 1 tablet in 2 hours, no more than 2 tablets in 24 hours, Disp: 8 tablet, Rfl: 11   XIIDRA 5 % SOLN, Place 1 drop into both eyes 2 (two) times daily., Disp: , Rfl:   Current Facility-Administered Medications:    denosumab  (PROLIA ) injection 60 mg, 60 mg, Subcutaneous, Q6 months, Wallace, Tiffany A, NP  Social History   Tobacco Use  Smoking Status Never  Smokeless Tobacco Never    Allergies  Allergen Reactions   Ubrogepant  Swelling    Lip swelling    Benazepril      Lip swelling   Cymbalta [Duloxetine Hcl]     Lip swelling   Hygroton  [Chlorthalidone ] Swelling    Swelling in lips   Mirabegron Itching    Other reaction(s): rash   Other     Other reaction(s): Unknown   Duloxetine Rash    Lip swelling    Objective:  There were no vitals filed for this visit. There is no height or weight on file to calculate BMI. Constitutional Well developed. Well nourished.  Vascular Dorsalis pedis pulses palpable bilaterally. Posterior tibial pulses palpable bilaterally. Capillary refill normal to all digits.  No cyanosis  or clubbing noted. Pedal hair growth normal.  Neurologic Normal speech. Oriented to person, place, and time. Epicritic sensation to light touch grossly present bilaterally.  Dermatologic Nails left thickened and onychodystrophy mycotic nail x 1 left hallux improving Skin within normal limits  Orthopedic: Normal joint ROM without pain or crepitus bilaterally. No visible deformities. No bony tenderness.   Radiographs: None Assessment:   1. Nail fungus   2. Onychomycosis due to dermatophyte      Plan:  Patient was evaluated and treated and all questions answered.  Left hallux onychomycosis~second round  -Educated the patient on the etiology of onychomycosis and various treatment options associated with improving the fungal load.  I explained to the patient that there is 3 treatment options available to treat the onychomycosis including topical, p.o., laser treatment.  Patient elected to undergo topical medication.  Her last liver enzymes were elevated therefore she does not want to do any more oral medication.  She wishes to do topical medication.  Penlac was sent to the pharmacy encouraged her to do it twice a day possible   No follow-ups on file.

## 2024-01-22 ENCOUNTER — Other Ambulatory Visit: Payer: Self-pay | Admitting: *Deleted

## 2024-01-22 MED ORDER — UBRELVY 100 MG PO TABS: 100.0000 mg | ORAL_TABLET | Freq: Every day | ORAL | 8 refills | Status: AC | PRN

## 2024-01-22 NOTE — Telephone Encounter (Signed)
 Last seen on 08/27/23 Follow up scheduled on 09/02/24

## 2024-01-30 ENCOUNTER — Other Ambulatory Visit: Payer: Self-pay | Admitting: *Deleted

## 2024-01-30 DIAGNOSIS — M810 Age-related osteoporosis without current pathological fracture: Secondary | ICD-10-CM

## 2024-01-30 MED ORDER — DENOSUMAB-BBDZ 60 MG/ML ~~LOC~~ SOSY: 60.0000 mg | PREFILLED_SYRINGE | Freq: Once | SUBCUTANEOUS | Status: DC

## 2024-01-30 MED ORDER — JUBBONTI 60 MG/ML ~~LOC~~ SOSY
60.0000 mg | PREFILLED_SYRINGE | SUBCUTANEOUS | 0 refills | Status: DC
  Filled 2024-02-07: qty 1, 180d supply, fill #0

## 2024-01-30 NOTE — Telephone Encounter (Signed)
 Prescription and referral for Jubbonti sent to [facility].   Encounter closed.

## 2024-02-01 ENCOUNTER — Other Ambulatory Visit (HOSPITAL_COMMUNITY): Payer: Self-pay

## 2024-02-04 ENCOUNTER — Other Ambulatory Visit (HOSPITAL_COMMUNITY): Payer: Self-pay

## 2024-02-04 ENCOUNTER — Telehealth: Payer: Self-pay

## 2024-02-04 NOTE — Telephone Encounter (Signed)
 Prolia VOB initiated via [email or web address]  Next Prolia inj DUE: NOW

## 2024-02-06 ENCOUNTER — Other Ambulatory Visit (HOSPITAL_COMMUNITY): Payer: Self-pay

## 2024-02-06 NOTE — Telephone Encounter (Signed)
 Buy/Bill (Office supplied medication)  Out-of-pocket cost due at time of clinic visit: $287  Number of injection/visits approved: 2  Primary: [employer] Co-insurance: 15% Admin fee co-insurance: $40  Secondary: MEDICARE Co-insurance:  Admin fee co-insurance:   Medical Benefit Details: Date Benefits were checked: 02/05/24 Deductible: $600 Met of $600 Required [facility]), $257 Met of $257 Required (MEDICARE) / Coinsurance: 15%/ Admin Fee: $40  Prior Auth: APPROVED [facility]) PA# [id number] Expiration Date: 02/06/24-02/05/25  # of doses approved: 2  Prior Auth: N/A (MEDICARE) PA#  Expiration Date:   # of doses approved:  -----------------------------------------------------------------------  Patient NOT eligible for Copay Card. Copay Card can make patient's cost as little as $25. Link to apply: [email or web address]://www.amgensupportplus.[email or web address]/copay  ** This summary of benefits is an estimation of the patient's out-of-pocket cost. Exact cost may very based on individual plan coverage.

## 2024-02-06 NOTE — Telephone Encounter (Signed)
 SABRA

## 2024-02-07 ENCOUNTER — Other Ambulatory Visit: Payer: Self-pay

## 2024-02-07 NOTE — Telephone Encounter (Signed)
 Medication will be filled under Medical Benefit. MTDM appointment request cancelled.

## 2024-02-13 ENCOUNTER — Other Ambulatory Visit: Payer: Self-pay

## 2024-02-13 ENCOUNTER — Other Ambulatory Visit: Payer: Self-pay | Admitting: Nurse Practitioner

## 2024-02-13 DIAGNOSIS — M810 Age-related osteoporosis without current pathological fracture: Secondary | ICD-10-CM

## 2024-02-13 MED ORDER — ALENDRONATE SODIUM 70 MG PO TABS: 70.0000 mg | ORAL_TABLET | ORAL | 0 refills | Status: DC

## 2024-02-13 NOTE — Telephone Encounter (Signed)
 Med refill request:    alendronate  (FOSAMAX ) 70 MG tablet  Refill already sent yesterday.

## 2024-02-14 ENCOUNTER — Other Ambulatory Visit: Payer: Self-pay

## 2024-02-25 NOTE — Progress Notes (Unsigned)
 NORMAJEAN NASH 1965/08/05 [id number]  History:  58 y.o. G2P2002 presents for breast and pelvic exam. Postmenopausal - no HRT, no bleeding. 1990 CIN-1, subsequent paps normal. On Fosamax  2017-2023, switched to Prolia  11/2021 due to length of use of Fosamax  and decrease in bone mass but switching back to Fosamax  now due to cost of Prolia . Frequent use of prednisone for SLE. History of SLE, HTN, uterine fibroids.   Gynecologic History No LMP recorded. Patient is postmenopausal.   Contraception/Family planning: post menopausal status Sexually active: No  Health Maintenance Last Pap: 02/18/2024. Results were: Limited d/t blood neg HPV Last mammogram: 08/03/2023. Results were: Normal Last colonoscopy: 2017. Results were: Normal, 10-year recall Last Dexa: 11/27/2023. Results were: T-score -2.0     02/26/2024    8:19 AM  Depression screen PHQ 2/9  Decreased Interest 0  Down, Depressed, Hopeless 0  PHQ - 2 Score 0     Past medical history, past surgical history, family history and social history were all reviewed and documented in the EPIC chart. Separated. On disability. 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  ROS:  A ROS was performed and pertinent positives and negatives are included.  Exam:  Vitals:   02/26/24 0812  BP: 118/70  Pulse: 72  SpO2: 98%  Weight: 213 lb (96.6 kg)  Height: 5' 2.5 (1.588 m)      Body mass index is 38.34 kg/m.  General appearance:  Normal Thyroid :  Symmetrical, normal in size, without palpable masses or nodularity. Respiratory  Auscultation:  Clear without wheezing or rhonchi Cardiovascular  Auscultation:  Regular rate, without rubs, murmurs or gallops  Edema/varicosities:  Not grossly evident Abdominal  Soft,nontender, without masses, guarding or rebound.  Liver/spleen:  No organomegaly noted  Hernia:  None appreciated  Skin  Inspection:  Grossly  normal Breasts: Examined lying and sitting.   Right: Without masses, retractions, nipple discharge or axillary adenopathy.   Left: Without masses, retractions, nipple discharge or axillary adenopathy. Pelvic: External genitalia:  no lesions              Urethra:  normal appearing urethra with no masses, tenderness or lesions              Bartholins and Skenes: normal                 Vagina: normal appearing vagina with normal color and discharge, no lesions. Atrophic changes, bleeding with pap              Cervix: no lesions Bimanual Exam:  Uterus:  no masses or tenderness              Adnexa: no mass, fullness, tenderness              Rectovaginal: Deferred              Anus:  normal, no lesions  Zada Louder, CMA present as chaperone.     Assessment/Plan:  58 y.o. [id number] for breast and pelvic exam.   Encounter for breast and pelvic examination - Plan: Cytology - PAP( [place]).  Education provided on SBEs, importance of preventative screenings, current guidelines, high calcium  diet, regular exercise, and multivitamin daily. Labs with PCP.   Postmenopausal - no HRT, no bleeding. Occasional hot flashes.   Depression screening - PHQ - 0  Age-related osteoporosis without current pathological fracture - Plan: alendronate  (FOSAMAX ) 70 MG tablet  weekly. 11/2023 T-score -2.0. Fosamax  2017-2023, switched to Prolia  11/2021 due to length of use of Fosamax  and decrease in bone mass but switching back Fosamax  now due to cost of Prolia .  Cervical cancer screening - Plan: Cytology - PAP( [place]).  1990 CIN-1, subsequent paps normal. Pap last year limited d/t blood, so repeating today.   Screening for breast cancer - Normal mammogram history.  Continue annual screenings.  Normal breast exam today.  Screening for colon cancer - 2017 colonoscopy. Will repeat at 10-year interval per GI's recommendation.   Return in about 1 year (around 02/25/2025) for B&P (high risk).     Annabella DELENA Shutter DNP, 8:41 AM 02/26/2024

## 2024-02-26 ENCOUNTER — Other Ambulatory Visit (HOSPITAL_COMMUNITY)
Admission: RE | Admit: 2024-02-26 | Discharge: 2024-02-26 | Disposition: A | Discharge status: Home / Self Care | Source: Ambulatory Visit | Attending: Nurse Practitioner | Admitting: Nurse Practitioner

## 2024-02-26 ENCOUNTER — Ambulatory Visit: Admitting: Nurse Practitioner

## 2024-02-26 ENCOUNTER — Encounter: Payer: Self-pay | Admitting: Nurse Practitioner

## 2024-02-26 VITALS — BP 118/70 | HR 72 | Ht 62.5 in | Wt 213.0 lb

## 2024-02-26 DIAGNOSIS — Z1331 Encounter for screening for depression: Secondary | ICD-10-CM

## 2024-02-26 DIAGNOSIS — M810 Age-related osteoporosis without current pathological fracture: Secondary | ICD-10-CM | POA: Diagnosis not present

## 2024-02-26 DIAGNOSIS — Z780 Asymptomatic menopausal state: Secondary | ICD-10-CM

## 2024-02-26 DIAGNOSIS — Z9289 Personal history of other medical treatment: Secondary | ICD-10-CM | POA: Diagnosis not present

## 2024-02-26 DIAGNOSIS — B009 Herpesviral infection, unspecified: Secondary | ICD-10-CM | POA: Diagnosis not present

## 2024-02-26 DIAGNOSIS — Z124 Encounter for screening for malignant neoplasm of cervix: Secondary | ICD-10-CM

## 2024-02-26 DIAGNOSIS — Z01419 Encounter for gynecological examination (general) (routine) without abnormal findings: Secondary | ICD-10-CM

## 2024-02-26 MED ORDER — ALENDRONATE SODIUM 70 MG PO TABS: 70.0000 mg | ORAL_TABLET | ORAL | 3 refills | Status: AC

## 2024-02-27 ENCOUNTER — Ambulatory Visit: Payer: Self-pay | Admitting: Nurse Practitioner

## 2024-02-27 LAB — CYTOLOGY - PAP: Diagnosis: NEGATIVE

## 2024-03-31 ENCOUNTER — Other Ambulatory Visit: Payer: Self-pay | Admitting: Cardiovascular Disease

## 2024-03-31 ENCOUNTER — Encounter: Payer: Self-pay | Admitting: Pharmacist Clinician (PhC)/ Clinical Pharmacy Specialist

## 2024-04-02 ENCOUNTER — Other Ambulatory Visit: Payer: Self-pay | Admitting: Cardiovascular Disease

## 2024-04-02 ENCOUNTER — Telehealth: Payer: Self-pay | Admitting: Pharmacy Technician

## 2024-04-02 ENCOUNTER — Other Ambulatory Visit (HOSPITAL_COMMUNITY): Payer: Self-pay

## 2024-04-02 NOTE — Telephone Encounter (Signed)
 Pharmacy Patient Advocate Encounter   Received notification from Physician's Office- kristin that prior authorization for wegovy  is required/requested.   Insurance verification completed.   The patient is insured through [facility].   Per test claim: PA required; PA submitted to above mentioned insurance via Latent Key/confirmation #/[id number] Status is pending    If denied- try zepbound

## 2024-04-02 NOTE — Telephone Encounter (Signed)
 Patient was told insurance will now cover weight loss.  Can you please try PA for Wegovy ?  (Zepbound if Wegovy  not covered)

## 2024-04-03 ENCOUNTER — Other Ambulatory Visit (HOSPITAL_COMMUNITY): Payer: Self-pay

## 2024-04-03 ENCOUNTER — Telehealth: Payer: Self-pay | Admitting: Pharmacy Technician

## 2024-04-03 NOTE — Telephone Encounter (Signed)
 Pharmacy Patient Advocate Encounter  Received notification from [facility] that Prior Authorization for wegovy  has been DENIED.  See denial reason below. No denial letter attached in CMM. Will attach denial letter to Media tab once received.   PA #/Case ID/Reference #: [id number]

## 2024-04-03 NOTE — Telephone Encounter (Signed)
 Pharmacy Patient Advocate Encounter  Received notification from [facility] that Prior Authorization for zepbound has been DENIED.  Full denial letter will be uploaded to the media tab. See denial reason below.   PA #/Case ID/Reference #: [id number]

## 2024-04-03 NOTE — Telephone Encounter (Signed)
 Pharmacy Patient Advocate Encounter   Received notification from Physician's Office that prior authorization for zepbound is required/requested.   Insurance verification completed.   The patient is insured through [facility].   Per test claim: PA required; PA submitted to above mentioned insurance via Latent Key/confirmation #/EOC [facility] Status is pending

## 2024-04-15 NOTE — Telephone Encounter (Signed)
Changed to Fosamax .

## 2024-06-17 ENCOUNTER — Ambulatory Visit: Admitting: Cardiovascular Disease

## 2024-09-02 ENCOUNTER — Ambulatory Visit: Admitting: Family Medicine

## 2025-02-26 ENCOUNTER — Encounter: Admitting: Nurse Practitioner
